# Patient Record
Sex: Female | Born: 1935 | Race: White | Hispanic: No | Marital: Married | Smoking: Former smoker
Health system: Southern US, Community
[De-identification: ages and names within clinical notes are randomized; demographics above are authoritative.]

## PROBLEM LIST (undated history)

## (undated) DIAGNOSIS — I499 Cardiac arrhythmia, unspecified: Secondary | ICD-10-CM

## (undated) DIAGNOSIS — I340 Nonrheumatic mitral (valve) insufficiency: Secondary | ICD-10-CM

## (undated) DIAGNOSIS — E039 Hypothyroidism, unspecified: Secondary | ICD-10-CM

## (undated) DIAGNOSIS — R002 Palpitations: Secondary | ICD-10-CM

## (undated) DIAGNOSIS — D589 Hereditary hemolytic anemia, unspecified: Secondary | ICD-10-CM

## (undated) DIAGNOSIS — R9430 Abnormal result of cardiovascular function study, unspecified: Secondary | ICD-10-CM

## (undated) DIAGNOSIS — I5189 Other ill-defined heart diseases: Secondary | ICD-10-CM

## (undated) DIAGNOSIS — I517 Cardiomegaly: Secondary | ICD-10-CM

## (undated) DIAGNOSIS — G8929 Other chronic pain: Secondary | ICD-10-CM

## (undated) DIAGNOSIS — I2510 Atherosclerotic heart disease of native coronary artery without angina pectoris: Secondary | ICD-10-CM

## (undated) DIAGNOSIS — I219 Acute myocardial infarction, unspecified: Secondary | ICD-10-CM

## (undated) DIAGNOSIS — R0602 Shortness of breath: Secondary | ICD-10-CM

## (undated) DIAGNOSIS — M1990 Unspecified osteoarthritis, unspecified site: Secondary | ICD-10-CM

## (undated) DIAGNOSIS — M549 Dorsalgia, unspecified: Secondary | ICD-10-CM

## (undated) DIAGNOSIS — I358 Other nonrheumatic aortic valve disorders: Secondary | ICD-10-CM

## (undated) DIAGNOSIS — I10 Essential (primary) hypertension: Secondary | ICD-10-CM

## (undated) DIAGNOSIS — J449 Chronic obstructive pulmonary disease, unspecified: Secondary | ICD-10-CM

## (undated) DIAGNOSIS — J189 Pneumonia, unspecified organism: Secondary | ICD-10-CM

## (undated) DIAGNOSIS — K219 Gastro-esophageal reflux disease without esophagitis: Secondary | ICD-10-CM

## (undated) DIAGNOSIS — I739 Peripheral vascular disease, unspecified: Secondary | ICD-10-CM

## (undated) DIAGNOSIS — I779 Disorder of arteries and arterioles, unspecified: Secondary | ICD-10-CM

## (undated) DIAGNOSIS — J80 Acute respiratory distress syndrome: Secondary | ICD-10-CM

## (undated) DIAGNOSIS — N390 Urinary tract infection, site not specified: Secondary | ICD-10-CM

## (undated) DIAGNOSIS — IMO0002 Reserved for concepts with insufficient information to code with codable children: Secondary | ICD-10-CM

## (undated) DIAGNOSIS — Z01818 Encounter for other preprocedural examination: Secondary | ICD-10-CM

## (undated) HISTORY — DX: Pneumonia, unspecified organism: J18.9

## (undated) HISTORY — PX: SPINE SURGERY: SHX786

## (undated) HISTORY — DX: Nonrheumatic mitral (valve) insufficiency: I34.0

## (undated) HISTORY — DX: Urinary tract infection, site not specified: N39.0

## (undated) HISTORY — DX: Reserved for concepts with insufficient information to code with codable children: IMO0002

## (undated) HISTORY — DX: Other nonrheumatic aortic valve disorders: I35.8

## (undated) HISTORY — DX: Disorder of arteries and arterioles, unspecified: I77.9

## (undated) HISTORY — DX: Encounter for other preprocedural examination: Z01.818

## (undated) HISTORY — PX: APPENDECTOMY: SHX54

## (undated) HISTORY — DX: Chronic obstructive pulmonary disease, unspecified: J44.9

## (undated) HISTORY — DX: Abnormal result of cardiovascular function study, unspecified: R94.30

## (undated) HISTORY — DX: Hypothyroidism, unspecified: E03.9

## (undated) HISTORY — DX: Peripheral vascular disease, unspecified: I73.9

## (undated) HISTORY — PX: BACK SURGERY: SHX140

## (undated) HISTORY — DX: Acute respiratory distress syndrome: J80

## (undated) HISTORY — DX: Dorsalgia, unspecified: M54.9

## (undated) HISTORY — DX: Atherosclerotic heart disease of native coronary artery without angina pectoris: I25.10

## (undated) HISTORY — PX: TONSILLECTOMY: SUR1361

## (undated) HISTORY — DX: Hereditary hemolytic anemia, unspecified: D58.9

## (undated) HISTORY — PX: BREAST LUMPECTOMY: SHX2

## (undated) HISTORY — DX: Shortness of breath: R06.02

## (undated) HISTORY — DX: Palpitations: R00.2

## (undated) HISTORY — DX: Cardiomegaly: I51.7

## (undated) HISTORY — DX: Other ill-defined heart diseases: I51.89

## (undated) HISTORY — DX: Gastro-esophageal reflux disease without esophagitis: K21.9

## (undated) SURGERY — Surgical Case
Anesthesia: *Unknown

---

## 2001-06-15 DIAGNOSIS — J80 Acute respiratory distress syndrome: Secondary | ICD-10-CM

## 2001-06-15 HISTORY — DX: Acute respiratory distress syndrome: J80

## 2001-06-15 HISTORY — PX: CORONARY ANGIOPLASTY WITH STENT PLACEMENT: SHX49

## 2001-06-15 HISTORY — PX: TRACHEOSTOMY: SUR1362

## 2002-08-01 ENCOUNTER — Encounter: Payer: Self-pay | Admitting: Cardiology

## 2007-04-18 ENCOUNTER — Encounter: Payer: Self-pay | Admitting: Cardiology

## 2007-06-08 ENCOUNTER — Encounter: Payer: Self-pay | Admitting: Cardiology

## 2007-09-27 ENCOUNTER — Encounter: Payer: Self-pay | Admitting: Cardiology

## 2007-09-30 ENCOUNTER — Encounter: Payer: Self-pay | Admitting: Cardiology

## 2008-02-01 ENCOUNTER — Encounter: Payer: Self-pay | Admitting: Cardiology

## 2008-02-06 ENCOUNTER — Encounter: Payer: Self-pay | Admitting: Cardiology

## 2008-02-23 ENCOUNTER — Encounter: Payer: Self-pay | Admitting: Cardiology

## 2008-07-10 ENCOUNTER — Ambulatory Visit: Payer: Self-pay | Admitting: Cardiology

## 2008-07-26 ENCOUNTER — Ambulatory Visit: Payer: Self-pay | Admitting: Cardiology

## 2008-07-26 ENCOUNTER — Encounter: Payer: Self-pay | Admitting: Cardiology

## 2008-08-20 ENCOUNTER — Ambulatory Visit: Payer: Self-pay | Admitting: Surgery

## 2008-09-07 ENCOUNTER — Ambulatory Visit: Payer: Self-pay | Admitting: Cardiology

## 2009-02-25 ENCOUNTER — Ambulatory Visit: Payer: Self-pay | Admitting: Surgery

## 2009-02-25 ENCOUNTER — Encounter: Payer: Self-pay | Admitting: Cardiology

## 2009-04-04 ENCOUNTER — Encounter: Payer: Self-pay | Admitting: Cardiology

## 2009-07-01 ENCOUNTER — Encounter: Payer: Self-pay | Admitting: Cardiology

## 2009-07-02 ENCOUNTER — Encounter: Payer: Self-pay | Admitting: Cardiology

## 2009-07-03 ENCOUNTER — Encounter: Payer: Self-pay | Admitting: Cardiology

## 2009-07-11 ENCOUNTER — Encounter: Payer: Self-pay | Admitting: Cardiology

## 2009-08-07 ENCOUNTER — Encounter: Payer: Self-pay | Admitting: Cardiology

## 2009-08-08 ENCOUNTER — Encounter: Payer: Self-pay | Admitting: Cardiology

## 2009-08-13 HISTORY — PX: THYROID SURGERY: SHX805

## 2009-08-22 ENCOUNTER — Telehealth (INDEPENDENT_AMBULATORY_CARE_PROVIDER_SITE_OTHER): Payer: Self-pay | Admitting: *Deleted

## 2009-09-05 ENCOUNTER — Encounter: Payer: Self-pay | Admitting: Cardiology

## 2009-09-10 ENCOUNTER — Encounter: Payer: Self-pay | Admitting: Cardiology

## 2009-09-10 DIAGNOSIS — E782 Mixed hyperlipidemia: Secondary | ICD-10-CM | POA: Insufficient documentation

## 2009-09-10 DIAGNOSIS — E785 Hyperlipidemia, unspecified: Secondary | ICD-10-CM | POA: Insufficient documentation

## 2009-09-10 DIAGNOSIS — K219 Gastro-esophageal reflux disease without esophagitis: Secondary | ICD-10-CM | POA: Insufficient documentation

## 2009-09-11 ENCOUNTER — Encounter: Payer: Self-pay | Admitting: Cardiology

## 2009-09-11 ENCOUNTER — Ambulatory Visit: Payer: Self-pay | Admitting: Cardiology

## 2009-09-17 ENCOUNTER — Ambulatory Visit: Payer: Self-pay | Admitting: Cardiology

## 2009-09-17 ENCOUNTER — Encounter: Payer: Self-pay | Admitting: Cardiology

## 2009-12-17 ENCOUNTER — Encounter: Payer: Self-pay | Admitting: Cardiology

## 2010-01-30 ENCOUNTER — Encounter: Payer: Self-pay | Admitting: Cardiology

## 2010-03-17 ENCOUNTER — Ambulatory Visit: Payer: Self-pay | Admitting: Surgery

## 2010-03-27 ENCOUNTER — Encounter: Payer: Self-pay | Admitting: Cardiology

## 2010-07-15 NOTE — Assessment & Plan Note (Signed)
Summary: [facility]   Visit Type:  Follow-up Referring Provider:  Earma Reading, MD Primary Provider:  Guido Sander  CC:  CAD.  History of Present Illness: The patient is seen for followup of coronary artery disease. She is also followed for valvular heart disease.  She has had a difficult year.  She was found to be anemic.  She had GI studies showing no major GI bleeding.  She has been seen by hematology and felt to have hemolytic anemia.  I do not have the specifics of this evaluation.  It is being followed by Dr.Darovsky.  The patient was eventually able to undergo her thyroid surgery.  Fortunately she had benign adenomas. She's had some shortness of breath at times when her hemoglobin is low.  She has not had chest pain.   Preventive Screening-Counseling & Management  Alcohol-Tobacco     Smoking Status: quit     Year Quit: 2002  Current Medications (verified): 1)  Plavix 75 Mg Tabs (Clopidogrel Bisulfate) .... Take One Tablet By Mouth Daily 2)  Evista 60 Mg Tabs (Raloxifene Hcl) .... Take 1 Tablet By Mouth Once A Day 3)  Nexium 40 Mg Cpdr (Esomeprazole Magnesium) .... Take 1 Capsule By Mouth Once A Day 4)  Zocor 80 Mg Tabs (Simvastatin) .... Take 1 Tab By Mouth At Bedtime 5)  Temazepam 30 Mg Caps (Temazepam) .... Take 1 Capsule By Mouth At Bedtime 6)  Donnatal  Tabs (Belladonna Alk-Phenobarbital) .... Take 2 Tablets By Mouth At Bedtime 7)  Benazepril Hcl 5 Mg Tabs (Benazepril Hcl) .... Take 1 Tablet By Mouth Once A Day 8)  Flomax 0.4 Mg Caps (Tamsulosin Hcl) .... Take 1 Capsule By Mouth Once A Day 9)  Macrodantin 100 Mg Caps (Nitrofurantoin Macrocrystal) .... Take 1 Tab By Mouth At Bedtime 10)  Synthroid 75 Mcg Tabs (Levothyroxine Sodium) .... Take 1 Tablet By Mouth Once A Day 11)  Vitamin D3 2000 Unit Caps (Cholecalciferol) .... Take 1 Capsule By Mouth Two Times A Day 12)  Calcium 1800mg  .... Take 1 Tablet By Mouth Two Times A Day 13)  Multivitamins  Tabs (Multiple  Vitamin) .... Take 1 Tablet By Mouth Once A Day 14)  Fish Oil 1000 Mg Caps (Omega-3 Fatty Acids) .... Take 1 Capsule By Mouth Once A Day 15)  Claritin 10 Mg Tabs (Loratadine) .... Take 1 Tablet By Mouth Once A Day 16)  Aspirin 81 Mg Tbec (Aspirin) .... Take One Tablet By Mouth Daily 17)  Nitrostat 0.4 Mg Subl (Nitroglycerin) .Marland Kitchen.. 1 Tablet Under Tongue At Onset of Chest Pain; You May Repeat Every 5 Minutes For Up To 3 Doses. 18)  Nasonex 50 Mcg/act Susp (Mometasone Furoate) .... Use As Directed As Needed 19)  Premarin 0.625 Mg/gm Crea (Estrogens, Conjugated) .... Use As Directed As Needed  Allergies (verified): 1)  ! Bactrim 2)  ! * Tetracaine 3)  Tetracycline  Comments:  Nurse/Medical Assistant: The patient's medications and allergies were reviewed with the patient and were updated in the Medication and Allergy Lists. List reviewed.  Past History:  Past Medical History:  VENTRICULAR HYPERTROPHY, LEFT (ICD-429.3) CAD, NATIVE VESSEL (ICD-414.01).Marland KitchenMarland KitchenStent to LAD 2003  [facility]  /   .myoview.Marland KitchenMarland Kitchen2/2010.Marland Kitchen..Marland Kitchenno ischemia...EF  normal EF 60%...echo...2009 Aortic vale sclerosis....echo...2009 Mitral regurgitation  mild...echo...2009 Carotid artery disease....moderate.Marland KitchenMarland KitchenDr.Brabham following GERD Thyroid....surgery  08/2009 (Hx hypothyroidism)...benign adenomas Dyslipidemia ARDS with tracheostomy..peritonitis...2003 UTIs..chronic..Macrodantin Anemia.... hemolytic.... Dr.Darovsky  Review of Systems       Patient denies fever, chills, headache, sweats, rash, change in vision, change in  hearing, chest pain, cough, nausea vomiting, urinary symptoms.  All other systems are reviewed and are negative  Vital Signs:  Patient profile:   75 year old female Height:      62 inches Weight:      122.4 pounds BMI:     22.47 Pulse rate:   84 / minute BP sitting:   122 / 68  (left arm) Cuff size:   regular  Vitals Entered By: Carlye Grippe (September 11, 2009 3:11 PM) CC: CAD   Physical Exam  General:   The patient is stable today. Head:  head is atraumatic. Eyes:  no xanthelasma. Neck:  no jugular venous sedation.  The surgical site from her thyroid surgery is nicely healed. Chest Wall:  no chest wall tenderness. Lungs:  lungs are clear.  Respiratory effort is nonlabored. Heart:  cardiac exam reveals S1 and S2.  There is a murmur of aortic valve disease. Abdomen:  abdomen is soft. Msk:  no musculoskeletal deformities. Extremities:  no peripheral edema. Skin:  no skin rashes. Psych:  patient is oriented to person time and place.  Affect is normal.   Impression & Recommendations:  Problem # 1:  * HEMOLYTIC ANEMIA The patient's hemolytic anemia is being managed by hematology.  I feel that it is not related to her valvular heart disease.  She does not have a prosthetic valve.  However 2-D echo will be done to reassess to be sure that there are no unusual valvular findings.  Problem # 2:  * THYROID Fortunately the patient's surgery has shown that she had thyroid adenomas.  Problem # 3:  CAROTID ARTERY DISEASE (ICD-433.10) The patient is followed by vascular surgery.  Problem # 4:  AORTIC VALVE SCLEROSIS (ICD-424.1)  Her updated medication list for this problem includes:    Benazepril Hcl 5 Mg Tabs (Benazepril hcl) .Marland Kitchen... Take 1 tablet by mouth once a day    Nitrostat 0.4 Mg Subl (Nitroglycerin) .Marland Kitchen... 1 tablet under tongue at onset of chest pain; you may repeat every 5 minutes for up to 3 doses.  Orders: 2-D Echocardiogram (2D Echo) Patient will have a followup 2-D echo to reassess her valvular disease.  We will then be in touch with her with the findings.  Problem # 5:  CAD, NATIVE VESSEL (ICD-414.01)  Her updated medication list for this problem includes:    Plavix 75 Mg Tabs (Clopidogrel bisulfate) .Marland Kitchen... Take one tablet by mouth daily    Benazepril Hcl 5 Mg Tabs (Benazepril hcl) .Marland Kitchen... Take 1 tablet by mouth once a day    Aspirin 81 Mg Tbec (Aspirin) .Marland Kitchen... Take one tablet by  mouth daily    Nitrostat 0.4 Mg Subl (Nitroglycerin) .Marland Kitchen... 1 tablet under tongue at onset of chest pain; you may repeat every 5 minutes for up to 3 doses.  Orders: EKG w/ Interpretation (93000) 2-D Echocardiogram (2D Echo) Coronary artery disease is stable.  EKG is done today and reviewed by me.  There are no significant changes.  Patient remains on aspirin and Plavix.  She prefers to stay on Plavix long term.  If it is felt by hematology that it would be better to stop Plavix, this can be done from a cardiac viewpoint.  Patient Instructions: 1)  Your physician recommends that you continue on your current medications as directed. Please refer to the Current Medication list given to you today. 2)  Your physician wants you to follow-up in:23months. You will receive a reminder letter in the mail about two  months in advance. If you don't receive a letter, please call our office to schedule the follow-up appointment. 3)  Your physician has requested that you have an echocardiogram.  Echocardiography is a painless test that uses sound waves to create images of your heart. It provides your doctor with information about the size and shape of your heart and how well your heart's chambers and valves are working.  This procedure takes approximately one hour. There are no restrictions for this procedure.

## 2010-07-15 NOTE — Progress Notes (Signed)
Summary: SOB  Phone Note Call from Patient Call back at Home Phone [phone]   Caller: Patient Call For: nurse Summary of Call: patient left message on machine that she had thyroid surgery on Monday and today is having SOB. Nurse called patient and informed her that if she was having SOB that she needed to go to ED for evaluation. Patient stated it wasn't that bad for ED. Nurse suggested to patient that if she was having SOB that just started that she did need to be evaluated by a physician. Patient sees PA at [facility]. Patient is scheduled to see MD here on 30th. Patient verbalized understanding of plan.       Initial call taken by: Carlye Grippe,  August 22, 2009 2:17 PM Call placed by: Carlye Grippe,  August 22, 2009 2:11 PM  Follow-up for Phone Call        Agree  Talitha Givens, MD, [facility]  August 22, 2009 3:14 PM

## 2010-07-15 NOTE — Letter (Signed)
Summary: Appointment- Rescheduled  [facility] [address]   [place], [place] [address]   Phone: [phone]  Fax: [phone]     August 07, 2009 MRN: [id number]     Mckenzie [name] [address] [place], [place]  [address]     Dear Ms. DLUGOSZ,   Due to a change in our office schedule, your appointment on September 04, 2009                    at  3:15 pm must be changed.    Your new appointment is scheduled for March 30 at 2:15 pm.  We look forward to participating in your health care needs.   Please contact us at the number listed above at your earliest convenience to reschedule this appointment if needed.      Sincerely,  Glass blower/designer

## 2010-07-15 NOTE — Letter (Signed)
Summary: External Correspondence/ [facility] NOTE  External Correspondence/ [facility] NOTE   Imported By: Dorise Hiss 12/19/2009 09:54:14  _____________________________________________________________________  External Attachment:    Type:   Image     Comment:   External Document

## 2010-07-15 NOTE — Letter (Signed)
Summary: External Correspondence/ [facility]  External Correspondence/ [facility]   Imported By: Dorise Hiss 03/31/2010 12:12:55  _____________________________________________________________________  External Attachment:    Type:   Image     Comment:   External Document

## 2010-07-15 NOTE — Miscellaneous (Signed)
  Clinical Lists Changes  Problems: Removed problem of BRUIT (ICD-785.9) Added new problem of CAROTID ARTERY DISEASE (ICD-433.10) Added new problem of DYSLIPIDEMIA (ICD-272.4) Added new problem of * THYROID Added new problem of GERD (ICD-530.81) Added new problem of MITRAL REGURGITATION (ICD-396.3) Observations: Added new observation of PAST MED HX:  VENTRICULAR HYPERTROPHY, LEFT (ICD-429.3) CAD, NATIVE VESSEL (ICD-414.01).Marland KitchenMarland KitchenStent to LAD 2003  [facility]  /   .myoview.Marland KitchenMarland Kitchen2/2010.Marland Kitchen..Marland Kitchenno ischemia...EF  normal EF 60%...echo...2009 Aortic vale sclerosis....echo...2009 Mitral regurgitation  mild...echo...2009 Carotid artery disease....moderate.Marland KitchenMarland KitchenDr.Brabham following GERD Thyroid....surgery  08/2009 (Hx hypothyroidism) Dyslipidemia ARDS with tracheostomy..peritonitis...2003 UTIs..chronic..Macrodantin (09/10/2009 11:24)       Past History:  Past Medical History:  VENTRICULAR HYPERTROPHY, LEFT (ICD-429.3) CAD, NATIVE VESSEL (ICD-414.01).Marland KitchenMarland KitchenStent to LAD 2003  [facility]  /   .myoview.Marland KitchenMarland Kitchen2/2010.Marland Kitchen..Marland Kitchenno ischemia...EF  normal EF 60%...echo...2009 Aortic vale sclerosis....echo...2009 Mitral regurgitation  mild...echo...2009 Carotid artery disease....moderate.Marland KitchenMarland KitchenDr.Brabham following GERD Thyroid....surgery  08/2009 (Hx hypothyroidism) Dyslipidemia ARDS with tracheostomy..peritonitis...2003 UTIs..chronic..Macrodantin

## 2010-07-15 NOTE — Letter (Signed)
Summary: External Correspondence/ FAXED [facility]  External Correspondence/ FAXED [facility]   Imported By: Dorise Hiss 08/08/2009 16:29:58  _____________________________________________________________________  External Attachment:    Type:   Image     Comment:   External Document

## 2010-07-15 NOTE — Letter (Signed)
Summary: External Correspondence/ [facility]  External Correspondence/ [facility]   Imported By: Dorise Hiss 01/31/2010 11:48:14  _____________________________________________________________________  External Attachment:    Type:   Image     Comment:   External Document

## 2010-07-15 NOTE — Letter (Signed)
Summary: External Correspondence/ [facility]  External Correspondence/ [facility]   Imported By: Dorise Hiss 09/11/2009 12:03:04  _____________________________________________________________________  External Attachment:    Type:   Image     Comment:   External Document

## 2010-08-29 ENCOUNTER — Encounter: Payer: Self-pay | Admitting: *Deleted

## 2010-09-08 ENCOUNTER — Encounter: Payer: Self-pay | Admitting: Cardiology

## 2010-09-08 ENCOUNTER — Ambulatory Visit (INDEPENDENT_AMBULATORY_CARE_PROVIDER_SITE_OTHER): Payer: Medicare Other | Admitting: Cardiology

## 2010-09-08 ENCOUNTER — Other Ambulatory Visit: Payer: Self-pay | Admitting: *Deleted

## 2010-09-08 ENCOUNTER — Encounter: Payer: Self-pay | Admitting: *Deleted

## 2010-09-08 DIAGNOSIS — R0602 Shortness of breath: Secondary | ICD-10-CM

## 2010-09-08 DIAGNOSIS — I5189 Other ill-defined heart diseases: Secondary | ICD-10-CM | POA: Insufficient documentation

## 2010-09-08 DIAGNOSIS — J80 Acute respiratory distress syndrome: Secondary | ICD-10-CM | POA: Insufficient documentation

## 2010-09-08 DIAGNOSIS — I779 Disorder of arteries and arterioles, unspecified: Secondary | ICD-10-CM | POA: Insufficient documentation

## 2010-09-08 DIAGNOSIS — M549 Dorsalgia, unspecified: Secondary | ICD-10-CM | POA: Insufficient documentation

## 2010-09-08 DIAGNOSIS — I739 Peripheral vascular disease, unspecified: Secondary | ICD-10-CM

## 2010-09-08 DIAGNOSIS — K219 Gastro-esophageal reflux disease without esophagitis: Secondary | ICD-10-CM | POA: Insufficient documentation

## 2010-09-08 DIAGNOSIS — I517 Cardiomegaly: Secondary | ICD-10-CM | POA: Insufficient documentation

## 2010-09-08 DIAGNOSIS — D599 Acquired hemolytic anemia, unspecified: Secondary | ICD-10-CM

## 2010-09-08 DIAGNOSIS — E039 Hypothyroidism, unspecified: Secondary | ICD-10-CM

## 2010-09-08 DIAGNOSIS — R002 Palpitations: Secondary | ICD-10-CM

## 2010-09-08 DIAGNOSIS — D589 Hereditary hemolytic anemia, unspecified: Secondary | ICD-10-CM | POA: Insufficient documentation

## 2010-09-08 DIAGNOSIS — I358 Other nonrheumatic aortic valve disorders: Secondary | ICD-10-CM | POA: Insufficient documentation

## 2010-09-08 NOTE — Assessment & Plan Note (Signed)
>>  ASSESSMENT AND PLAN FOR HYPOTHYROIDISM WRITTEN ON 09/08/2010  3:33 PM BY KATZ, JEFFREY D, MD  We need to be sure that her thyroid status is okay.  TSH will be checked.

## 2010-09-08 NOTE — Patient Instructions (Signed)
Your physician recommends that you go to the [facility] for lab work: BMET/TSH A chest x-ray takes a picture of the organs and structures inside the chest, including the heart, lungs, and blood vessels. This test can show several things, including, whether the heart is enlarges; whether fluid is building up in the lungs; and whether pacemaker / defibrillator leads are still in place. Your physician has requested that you have an echocardiogram. Echocardiography is a painless test that uses sound waves to create images of your heart. It provides your doctor with information about the size and shape of your heart and how well your heart's chambers and valves are working. This procedure takes approximately one hour. There are no restrictions for this procedure. Your physician has requested that you have a lexiscan cardiolite. For further information please visit [email or web address] Please follow instruction sheet, as given. Your physician has recommended that you wear a holter monitor. Holter monitors are medical devices that record the heart's electrical activity. Doctors most often use these monitors to diagnose arrhythmias. Arrhythmias are problems with the speed or rhythm of the heartbeat. The monitor is a small, portable device. You can wear one while you do your normal daily activities. This is usually used to diagnose what is causing palpitations/syncope (passing out).

## 2010-09-08 NOTE — Assessment & Plan Note (Signed)
The patient reports that her hemoglobin has been in the 10-11 range with treatment.  It seems unlikely that this is the basis of her shortness of breath.

## 2010-09-08 NOTE — Assessment & Plan Note (Signed)
Etiology of her palpitations and racing heartbeat is not clear.  EKG today shows sinus rhythm.  She will wear a 48-hour recorder to get a better understanding of her heart rhythm.

## 2010-09-08 NOTE — Assessment & Plan Note (Signed)
I am hopeful that we can get her cleared for back surgery.  However with her symptoms a complete cardiac workup is definitely needed first

## 2010-09-08 NOTE — Assessment & Plan Note (Signed)
We need to be sure that her thyroid status is okay.  TSH will be checked.

## 2010-09-08 NOTE — Progress Notes (Signed)
HPI The patient is seen for the evaluation of shortness of breath.  I saw her last March, 2011.  She has mild coronary disease or he she received a stent to the LAD in 2003.  Myoview in February 2 010 revealed no ischemia.  Her ejection fraction most recently was 60% in 2009 and 2010.  There is aortic valve sclerosis by history.  There is moderate carotid artery disease.  She has a history of thyroid surgery.  She also has hemolytic anemia that is being treated with very low-dose prednisone.  She is not having PND or orthopnea.  She feels the shortness of breath with exertion.  She is having palpitations that she notices mostly at rest.  He feels it and senses a racing sensation.  She has not had any syncope or presyncope.  The shortness of breath seems to have come on over the past recent months. Allergies  Allergen Reactions  . Sulfamethoxazole W/Trimethoprim   . Tetracycline     REACTION: nausea    Current Outpatient Prescriptions  Medication Sig Dispense Refill  . alendronate (FOSAMAX) 70 MG tablet Take 70 mg by mouth every 7 (seven) days. Take with a full glass of water on an empty stomach.       Marland Kitchen aspirin 81 MG tablet Take 1 tablet by mouth daily.      Marland Kitchen atropine-PHENObarbital-scopolamine-hyoscyamine (DONNATAL) 16.2 MG per tablet Take 2 tablets by mouth at bedtime.        . benazepril (LOTENSIN) 5 MG tablet Take 1 tablet by mouth daily.      . Calcium Carbonate-Vit D-Min (CALCIUM 1200 PO) Take one tab daily       . Cholecalciferol (VITAMIN D3) 1000 UNITS CAPS Take one tab daily       . clopidogrel (PLAVIX) 75 MG tablet Take 1 tablet by mouth daily.       . Cobalamine Combinations (B-6 FOLIC ACID PO) Take by mouth. Take one tab daily       . conjugated estrogens (PREMARIN) vaginal cream Use as directed.       . docusate sodium (COLACE) 100 MG capsule 100 mg. Take 2 tabs twice a day       . esomeprazole (NEXIUM) 40 MG capsule Take 1 capsule by mouth daily.       . Ferrous Sulfate (PX IRON)  27 MG TABS Take by mouth. Take one tab daily       . hyoscyamine (LEVSIN SL) 0.125 MG SL tablet Use one tab sublingual every 6 hours as needed       . levothyroxine (SYNTHROID, LEVOTHROID) 75 MCG tablet Take 1 tablet by mouth daily.       Marland Kitchen loratadine (CLARITIN) 10 MG tablet Take 1 tablet by mouth daily.      . Melatonin 3-10 MG TABS Take by mouth. Take one tab at bedtime       . Multiple Vitamin (MULTIVITAMIN) tablet Take 1 tablet by mouth daily.        . nitrofurantoin (MACRODANTIN) 100 MG capsule Take 1 tablet by mouth every night.       . phenazopyridine (PYRIDIUM) 200 MG tablet Take one tab twice a day       . predniSONE (DELTASONE) 5 MG tablet Take 2.5mg  (1/2 tab) every other day       . raloxifene (EVISTA) 60 MG tablet Take 1 tablet by mouth daily.      . simvastatin (ZOCOR) 80 MG tablet Take 1 tablet by mouth every  night.       . Tamsulosin HCl (FLOMAX) 0.4 MG CAPS Take 1 capsule by mouth daily.       . temazepam (RESTORIL) 30 MG capsule Take 1 tablet by mouth every night.       . tretinoin (RETIN-A) 0.05 % cream Apply topically at bedtime.        Marland Kitchen DISCONTD: Omega-3 Fatty Acids (FISH OIL) 1000 MG CAPS Take 1 capsule by mouth daily.       . mometasone (NASONEX) 50 MCG/ACT nasal spray 2 sprays by Nasal route as needed.        . nitroGLYCERIN (NITROSTAT) 0.4 MG SL tablet Place one tablet under tongue every 5 minutes up to 3 doses as needed for chest pain. No more than 3 doses over a 15 minute period.       Marland Kitchen DISCONTD: Calcium Carbonate (CALCIUM 600 PO) Take 2 tablets by mouth 2 (two) times daily.        Marland Kitchen DISCONTD: Cholecalciferol (VITAMIN D3) 2000 UNITS TABS Take 1 tablet by mouth 2 (two) times daily.          History   Social History  . Marital Status: Married    Spouse Name: N/A    Number of Children: N/A  . Years of Education: N/A   Occupational History  . Retired    Social History Main Topics  . Smoking status: Former Smoker    Quit date: 06/15/2000  . Smokeless tobacco:  Not on file  . Alcohol Use: No  . Drug Use: No  . Sexually Active: Not on file   Other Topics Concern  . Not on file   Social History Narrative   Regular Exercise: Yes-Walk    Family History  Problem Relation Age of Onset  . COPD Other     Past Medical History  Diagnosis Date  . Left ventricular hypertrophy     EF 65%, echo, April, 2011  . Coronary atherosclerosis of native coronary artery     Stent to LAD 2003  [facility]  /   .myoview.Marland KitchenMarland Kitchen2/2010.Marland Kitchen..Marland Kitchenno ischemia...EF  normal; EF 60% by Echo 2009  . Aortic valve sclerosis     Echo 2009,  Echo, 2011  . MR (mitral regurgitation)     Mild per Echo 2009,  mild, echo, April, 2011  . Carotid arterial disease     Moderate-Dr. Myra Gianotti following  . GERD (gastroesophageal reflux disease)   . Hypothyroidism   . ARDS (adult respiratory distress syndrome) 2003    with tracheostomy-peritonitis  . UTI (urinary tract infection)     Chronic-on Macrodantin  . Hemolytic anemia     Followed by Dr. Derald Macleod  . Diastolic dysfunction     Echo, April, 20 and one, EF 65%  . Shortness of breath     March, 2012  . Palpitations     March, 2012  . Back pain     Need for surgery, Dr. Trey Sailors    Past Surgical History  Procedure Date  . Appendectomy   . Breast lumpectomy   . Tonsillectomy   . Tracheostomy 2003    Following ARDS   . Thyroid surgery 08/2009    Benign adenomas    ROS  Patient denies fever, chills, headache, sweats, rash, change in vision, change in hearing, chest pain, cough, nausea vomiting, urinary symptoms.  All other systems are reviewed and are negative. PHYSICAL EXAM Patient is stable today.  She is concerned about her shortness of breath.  She is oriented  to person time and place.  Affect is normal.  Head is atraumatic.  There is no xanthelasma.  There is no jugular venous distention.  Lungs are clear.  Respiratory effort is nonlabored.  Cardiac exam reveals a swollen and S2.  There no clicks.  There is a soft systolic  murmur.  The abdomen is soft.  There is no peripheral edema.  There are no musculoskeletal deformities.  There no skin rashes. Filed Vitals:   09/08/10 1506  BP: 119/63  Pulse: 80  Height: 5\' 4"  (1.626 m)  Weight: 124 lb 6.4 oz (56.427 kg)    EKG  EKG is done today and reviewed by me.  It is normal.  ASSESSMENT & PLAN

## 2010-09-08 NOTE — Assessment & Plan Note (Addendum)
The etiology of her shortness of breath is not clear at this time.  She does not appear to have overt signs of congestive heart failure.  It is possible that this could be an anginal equivalent.  She is not having chest pain.  It is possible also that she could have had change in left ventricular function.  Two-dimensional echo will be done to reassess her aortic valve disease and mitral valve disease and assess her left ventricular function.  She will also have a nuclear exercise scan to rule out ischemia.  We need this for diagnosis and we also need this to help clear her for the surgery that she needs. She will also have a chest x-ray.

## 2010-09-10 ENCOUNTER — Encounter: Payer: Self-pay | Admitting: Cardiology

## 2010-09-15 DIAGNOSIS — R0602 Shortness of breath: Secondary | ICD-10-CM

## 2010-09-22 ENCOUNTER — Telehealth: Payer: Self-pay | Admitting: *Deleted

## 2010-09-22 NOTE — Telephone Encounter (Signed)
Pt would like to know the results of her tests (stress test and Echo) that were done last week. These are scanned in but I cannot tell if they have been reviewed by Dr. Myrtis Ser.

## 2010-09-23 NOTE — Telephone Encounter (Signed)
Pt notified of results and verbalized understanding  

## 2010-09-23 NOTE — Telephone Encounter (Signed)
Test results all look good including chest x-ray, nuclear, echo.

## 2010-09-25 ENCOUNTER — Encounter: Payer: Self-pay | Admitting: Cardiology

## 2010-09-25 ENCOUNTER — Telehealth: Payer: Self-pay | Admitting: *Deleted

## 2010-09-25 NOTE — Telephone Encounter (Signed)
Left message to call back on voicemail regarding holter results. 

## 2010-09-30 NOTE — Telephone Encounter (Signed)
Pt notified of results by Isabelle Course on 4/13.

## 2010-10-14 ENCOUNTER — Encounter: Payer: Self-pay | Admitting: Cardiology

## 2010-10-14 DIAGNOSIS — R002 Palpitations: Secondary | ICD-10-CM | POA: Insufficient documentation

## 2010-10-14 DIAGNOSIS — R9430 Abnormal result of cardiovascular function study, unspecified: Secondary | ICD-10-CM | POA: Insufficient documentation

## 2010-10-14 DIAGNOSIS — I340 Nonrheumatic mitral (valve) insufficiency: Secondary | ICD-10-CM | POA: Insufficient documentation

## 2010-10-16 ENCOUNTER — Ambulatory Visit (INDEPENDENT_AMBULATORY_CARE_PROVIDER_SITE_OTHER): Payer: Medicare Other | Admitting: Cardiology

## 2010-10-16 ENCOUNTER — Encounter: Payer: Self-pay | Admitting: Cardiology

## 2010-10-16 ENCOUNTER — Encounter: Payer: Self-pay | Admitting: *Deleted

## 2010-10-16 DIAGNOSIS — R002 Palpitations: Secondary | ICD-10-CM

## 2010-10-16 DIAGNOSIS — R0602 Shortness of breath: Secondary | ICD-10-CM

## 2010-10-16 DIAGNOSIS — M549 Dorsalgia, unspecified: Secondary | ICD-10-CM

## 2010-10-16 LAB — PROTIME-INR

## 2010-10-16 MED ORDER — DILTIAZEM HCL ER BEADS 120 MG PO CP24: 120.0000 mg | ORAL_CAPSULE | Freq: Every day | ORAL | Status: DC

## 2010-10-16 NOTE — Patient Instructions (Signed)
Follow up as scheduled. Your physician recommends that you go to the [facility] for lab work: DO TODAY! Your physician has requested that you have a cardiac catheterization. Cardiac catheterization is used to diagnose and/or treat various heart conditions. Doctors may recommend this procedure for a number of different reasons. The most common reason is to evaluate chest pain. Chest pain can be a symptom of coronary artery disease (CAD), and cardiac catheterization can show whether plaque is narrowing or blocking your heart's arteries. This procedure is also used to evaluate the valves, as well as measure the blood flow and oxygen levels in different parts of your heart. For further information please visit [email or web address] Please follow instruction sheet, as given. Start Diltiazem 120mg  daily.

## 2010-10-16 NOTE — Assessment & Plan Note (Signed)
Today's information will be sent to Dr. Channing Mutters.  I will let him know when she can be cleared for back surgery.

## 2010-10-16 NOTE — Progress Notes (Signed)
HPI Patient is seen for cardiology followup.  She is here with her husband today.  I had an extensive discussion with them.  I saw her last on September 08, 2010.  She had received a stent to the LAD in 2003 at [facility] in 2010 showed no ischemia.  Ejection fraction is normal.  She has exertional shortness of breath and palpitations.  We arranged for blood studies, echo, nuclear study and a Holter monitor.  The blood study showed no significant abnormalities.  The echo revealed normal LV function with aortic valve sclerosis.  Event recorder revealed PACs and PVCs.  There was one 4 beat run of supraventricular tachycardia.  A nuclear scan as limitations due to artifact.  There were some reversible defects seen at the base of the inferolateral wall and the inferior wall.  Also there was a possibility of some ischemia at the anteroseptal apex and inferior base.  The EF was normal.  Today in the office I explained all of these results.  The patient needs to have back surgery.  She has exertional shortness of breath.  I cannot be sure if this is her anginal equivalent or not.  Chest x-ray was also done recently showing no marked abnormalities. Allergies  Allergen Reactions  . Sulfamethoxazole W/Trimethoprim   . Tetracycline     REACTION: nausea    Current Outpatient Prescriptions  Medication Sig Dispense Refill  . alendronate (FOSAMAX) 70 MG tablet Take one tablet on every Saturday.      Marland Kitchen aspirin 81 MG tablet Take 1 tablet by mouth daily.      Marland Kitchen atropine-PHENObarbital-scopolamine-hyoscyamine (DONNATAL) 16.2 MG per tablet Take 2 tablets by mouth at bedtime.        . benazepril (LOTENSIN) 5 MG tablet Take 1 tablet by mouth daily.      . Calcium Carbonate-Vit D-Min (CALCIUM 1200 PO) Take one tab daily       . Cholecalciferol (VITAMIN D3) 1000 UNITS CAPS Take one tab daily       . clopidogrel (PLAVIX) 75 MG tablet Take 1 tablet by mouth daily.       . Cobalamine Combinations (B-6 FOLIC ACID PO) Take  by mouth. Take one tab daily       . conjugated estrogens (PREMARIN) vaginal cream Use as directed.       . docusate sodium (COLACE) 100 MG capsule 100 mg. Take 2 tabs twice a day       . esomeprazole (NEXIUM) 40 MG capsule Take 1 capsule by mouth daily.       . Ferrous Sulfate (PX IRON) 27 MG TABS Take by mouth. Take one tab daily       . hyoscyamine (LEVSIN SL) 0.125 MG SL tablet Use one tab sublingual every 6 hours as needed       . levothyroxine (SYNTHROID, LEVOTHROID) 75 MCG tablet Take 1 tablet by mouth daily.       Marland Kitchen loratadine (CLARITIN) 10 MG tablet Take 1 tablet by mouth daily.      . Melatonin 3-10 MG TABS Take by mouth. Take one tab at bedtime       . mometasone (NASONEX) 50 MCG/ACT nasal spray 2 sprays by Nasal route as needed.        . Multiple Vitamin (MULTIVITAMIN) tablet Take 1 tablet by mouth daily.        . nitrofurantoin (MACRODANTIN) 100 MG capsule Take 1 tablet by mouth every night.       . phenazopyridine (  PYRIDIUM) 200 MG tablet Take one tab twice a day       . predniSONE (DELTASONE) 5 MG tablet Take 2.5mg  (1/2 tab) every other day       . raloxifene (EVISTA) 60 MG tablet Take 1 tablet by mouth daily.      . simvastatin (ZOCOR) 80 MG tablet Take 1 tablet by mouth every night.       . Tamsulosin HCl (FLOMAX) 0.4 MG CAPS Take 1 capsule by mouth daily.       . temazepam (RESTORIL) 30 MG capsule Take 1 tablet by mouth every night.       . tretinoin (RETIN-A) 0.05 % cream Apply topically at bedtime.        . nitroGLYCERIN (NITROSTAT) 0.4 MG SL tablet Place one tablet under tongue every 5 minutes up to 3 doses as needed for chest pain. No more than 3 doses over a 15 minute period.         History   Social History  . Marital Status: Married    Spouse Name: N/A    Number of Children: N/A  . Years of Education: N/A   Occupational History  . Retired    Social History Main Topics  . Smoking status: Former Smoker    Quit date: 06/15/2000  . Smokeless tobacco: Not on  file  . Alcohol Use: No  . Drug Use: No  . Sexually Active: Not on file   Other Topics Concern  . Not on file   Social History Narrative   Regular Exercise: Yes-Walk    Family History  Problem Relation Age of Onset  . COPD Other     Past Medical History  Diagnosis Date  . Left ventricular hypertrophy     EF 65%, echo, April, 2011  . Coronary atherosclerosis of native coronary artery     Nuclear September 15, 2010, study affected by artifact, mild abnormalities but no definite marked ischemia    Stent to LAD 2003  [facility]  /   .[email or web address] KitchenMarland Kitchen2/2010.Marland Kitchen..Marland Kitchenno ischemia...EF  normal; EF 60% by Echo 2009  . Aortic valve sclerosis     Echo 2009,  Echo, 2011  . MR (mitral regurgitation)      Mild echo, 2012 ///  Mild per Echo 2009,  mild, echo, April, 2011  . Carotid arterial disease     Moderate-Dr. Myra Gianotti following  . GERD (gastroesophageal reflux disease)   . Hypothyroidism   . ARDS (adult respiratory distress syndrome) 2003    with tracheostomy-peritonitis  . UTI (urinary tract infection)     Chronic-on Macrodantin  . Hemolytic anemia     Followed by Dr. Derald Macleod  . Diastolic dysfunction     Echo, April, 2011   EF 65%  . Shortness of breath     March, 2012  . Palpitations     Holter, September 16, 2010, PACs and PVCs,one 4 beat run of SVT  . Back pain     Need for surgery, Dr. Trey Sailors  . Ejection fraction     EF 65%, echo, September 15, 2010    Past Surgical History  Procedure Date  . Appendectomy   . Breast lumpectomy   . Tonsillectomy   . Tracheostomy 2003    Following ARDS   . Thyroid surgery 08/2009    Benign adenomas    ROS  Patient denies fever, chills, headache, sweats, rash, change in vision, change in hearing, chest pain, cough, nausea vomiting, urinary symptoms.  All other systems are reviewed  and are negative.  PHYSICAL EXAM Patient is here with her husband.  She is oriented to person time and place.  Affect is normal.  Head is atraumatic.  There is no xanthelasma.   There no carotid bruits.  There is no jugular venous distention.  Lungs are clear.  Respiratory effort is not labored.  Cardiac exam reveals S1-S2.  No clicks or significant murmurs.  The abdomen is soft.  There is no peripheral edema.  There are no musculoskeletal deformities.  There are no skin rashes. Filed Vitals:   10/16/10 1308  BP: 123/65  Pulse: 73  Height: 5\' 2"  (1.575 m)  Weight: 127 lb 6.4 oz (57.788 kg)  SpO2: 93%      EKG is not done today.  ASSESSMENT & PLAN

## 2010-10-16 NOTE — Assessment & Plan Note (Signed)
As outlined in history of present illness, the patient does have some nuclear abnormalities.  It is not clear if her shortness of breath is angina.  After long discussion with the patient and her husband, we all agree that we should proceed with catheterization.  This will help with the assessment of her shortness of breath.  He will also help with clearing her for back surgery that she needs.  This is being scheduled.

## 2010-10-16 NOTE — Assessment & Plan Note (Signed)
As outlined in the history of present illness the patient has PACs and PVCs.  In addition she had a 4 beat run of supraventricular tachycardia.  Today I will start long-acting diltiazem at 120 mg daily.  I will see if this helps with her symptoms.

## 2010-10-21 ENCOUNTER — Inpatient Hospital Stay (HOSPITAL_BASED_OUTPATIENT_CLINIC_OR_DEPARTMENT_OTHER)
Admission: RE | Admit: 2010-10-21 | Discharge: 2010-10-21 | Disposition: A | Discharge status: Home / Self Care | Payer: Medicare Other | Source: Ambulatory Visit | Attending: Cardiovascular Disease | Admitting: Cardiovascular Disease

## 2010-10-21 DIAGNOSIS — Z9861 Coronary angioplasty status: Secondary | ICD-10-CM | POA: Insufficient documentation

## 2010-10-21 DIAGNOSIS — IMO0002 Reserved for concepts with insufficient information to code with codable children: Secondary | ICD-10-CM | POA: Insufficient documentation

## 2010-10-21 DIAGNOSIS — I2510 Atherosclerotic heart disease of native coronary artery without angina pectoris: Secondary | ICD-10-CM | POA: Insufficient documentation

## 2010-10-21 DIAGNOSIS — R9439 Abnormal result of other cardiovascular function study: Secondary | ICD-10-CM | POA: Insufficient documentation

## 2010-10-21 DIAGNOSIS — Y840 Cardiac catheterization as the cause of abnormal reaction of the patient, or of later complication, without mention of misadventure at the time of the procedure: Secondary | ICD-10-CM | POA: Insufficient documentation

## 2010-10-28 NOTE — Assessment & Plan Note (Signed)
OFFICE VISIT   YAEL, COPPESS  DOB:  [birth year]                                       08/20/2008  ZOXWR#:[id number]   REASON FOR VISIT:  Left carotid stenosis.   HISTORY:  This is a 75 year old female with significant coronary disease  status post coronary stenting for previous MI occurring in 2002.  She  also has a history of hypertension, hypercholesterolemia, which are  medically managed, and a history of smoking, having quit in 2002.  Dr.  Myrtis Ser heard a left carotid bruit and sent her for carotid duplex.  Duplex  reveals a 50% to 69% stenosis in bilateral carotid arteries.  The  patient denies having any symptoms.  Specifically, she denies amaurosis  fugax, numbness or weakness in either upper or lower extremity, no  slurring of her speech.  She is very active.   REVIEW OF SYSTEMS:  GENERAL:  Negative for fevers, chills, weight loss  or weight gain.  CARDIAC:  Positive for palpitations.  PULMONARY:  Negative.  GI:  Positive for reflux and hiatal hernia.  GU:  Has frequent urination.  VASCULAR:  Negative.  NEURO:  Negative.  HEME:  Positive for nosebleeds.   PAST MEDICAL HISTORY:  1. Hypertension.  2. Previous MI.  3. Hypercholesterolemia.  4. Coronary artery disease.  5. Hypothyroidism.   PAST SURGICAL HISTORY:  1. Tracheostomy.  2. Tonsillectomy.  3. Breast lumpectomy.  4. Appendectomy.  5. Coronary stent.   FAMILY HISTORY:  Negative for cardiovascular disease at an early age.   SOCIAL HISTORY:  She is married.  She is retired.  Does not smoke.  Has  a history smoking but quit in 2002.  Does not drink alcohol.   MEDICATIONS:  Plavix, Evista, simvastatin, temazepam, Donnatab,  benazepril, Flomax, Macrodantin, Synthroid, Nexium, Premarin cream,  Retin-A cream and Nasonex.   ALLERGIES:  Tetracycline.   PHYSICAL EXAMINATION:  Her blood pressure is 120/63, pulse is 56.  General:  She is well-appearing, no acute distress.  HEENT:   She is  normocephalic, atraumatic.  Pupils equal.  Sclerae are anicteric.  Neck:  Supple, no JVD, positive left carotid bruit.  Cardiovascular:  Regular  rate and rhythm.  No murmurs, rubs or gallops.  Pulmonary:  Lungs are  clear bilaterally.  Abdomen:  Soft.  Extremities:  Warm and well-  perfused.  Neuro:  She is nonfocal.  Psych:  She is alert and oriented  x3.   DIAGNOSTIC STUDIES:  I reviewed her ultrasound which reveals bilateral  moderate carotid stenosis.   ASSESSMENT/PLAN:  Moderate bilateral carotid stenosis.   Plan:  We discussed the signs and symptoms of a stroke and told her to  seek medical attention if these were to occur., I think that she needs  to have her ultrasound repeated in 6 months, we will arrange for that to  be done here, and I will see her following that visit.  She is doing an  excellent with medical management.  She is on a statin, her blood  pressure is well controlled, she is taking an aspirin.  I plan on seeing  her back and 6 months.   Jorge Ny, MD  Electronically Signed   VWB/MEDQ  D:  08/20/2008  T:  08/21/2008  Job:  [id number]   cc:   Luis Abed, MD, [facility]  Almond Lint, Dr.

## 2010-10-28 NOTE — Assessment & Plan Note (Signed)
[facility]                          [facility] OFFICE NOTE   Mckenzie [name], Mckenzie [name]                    MRN:          [id number]  DATE:09/07/2008                            DOB:          1935-09-07    Ms. Mckenzie [name] is doing very well.  I saw her on July 10, 2008.  I  reviewed all of her information at that time and she was stable.  She  does have coronary artery disease.  At that time, I heard a carotid  bruit and we arranged for carotid Dopplers.  Ultimately, she was  referred to Dr. Myra Gianotti of [facility] in [place].  He felt that she had  moderate bilateral disease and he has arranged to see her himself in  followup for Dopplers in 6 months to make sure that she is stable.  She  is on a statin and she is on aspirin.  Blood pressure is well  controlled.  We also arranged for a stress Myoview scan.  This showed no  significant ischemia and showed a good ejection fraction.  No further  cardiac workup is needed at this time.  She is feeling well in general.   PAST MEDICAL HISTORY:   ALLERGIES:  TETRACYCLINE.   MEDICATIONS:  See the prior flow sheet.   REVIEW OF SYSTEMS:  She has no GI or GU symptoms.  She has no fevers or  chills or headaches.  She has no musculoskeletal problems.  There are no  GI or GU symptoms.  All other systems were reviewed and are negative.   PHYSICAL EXAMINATION:  VITAL SIGNS:  Blood pressure is 116/61 with a  pulse of 70.  Weight is 124 pounds.  GENERAL:  The patient is oriented to person, time, and place.  Affect is  normal.  HEENT:  No xanthelasma.  She has normal extraocular motion.  NECK:  There is no jugular venous distention.  There is a left carotid  bruit.  CARDIAC:  S1 with an S2.  There are no clicks or significant murmurs.  ABDOMEN:  Soft.  EXTREMITIES:  She has no peripheral edema.   PROBLEMS:  Listed on my note of July 10, 2008.  She has good left ventricular function.  #4.  Left carotid bruit with  moderate carotid disease.  She will be  followed by Dr. Myra Gianotti.  #10.  Coronary artery disease, stable.  This will be followed.  #12.  Hyperlipidemia.  The patient is nicely controlled by history.  She  is on 80 mg of Zocor at this time.  The FDA has recently made a  recommendation to move away from the use of 80 mg of Zocor.  I have  explained this to the patient and we will reduce her dose to 40 mg.  She  will need followup lipid studies through her primary team.  If she has a  major change in her lipids, a more aggressive medicine can be used other  than simvastatin.  We will wait to see if there is any formal change in  the FDA recommendation.  She understands this fully.   She  is stable.  I will see her for Cardiology followup in 1 year.     Luis Abed, MD, [facility]  Electronically Signed    JDK/MedQ  DD: 09/07/2008  DT: 09/08/2008  Job #: [id number]   cc:   Donzetta Sprung

## 2010-10-28 NOTE — Assessment & Plan Note (Signed)
[facility]                          [facility] OFFICE NOTE   NAME:[name], [name]                    MRN:          [id number]  DATE:07/10/2008                            DOB:          08-12-1935    Mckenzie [name] is a pleasant 75 year old female.  She has known  coronary disease.  We have seen her in the past at [facility].  Her cardiology care has been primarily at [facility].  She was seen there  most recently in January 2009.  At that time, her cardiac status was  stable.  The patient had had perforated appendix and peritonitis in  2002.  She had a life-threatening illness and had ARDS.  She had  tracheostomy and recovered.  She had some cardiac abnormalities at that  time.  She was then followed up with a stress echo and she had wall  motion abnormalities.  She then received catheterization with a 90% mid  LAD and she received Multi-Link Zeta stent.  She had nonobstructive  disease elsewhere and she has been stable since then.  She did have a 2-  D echo done here in [place] on October 03, 2007.  Study showed mild-to-  moderate left ventricular hypertrophy.  Ejection fraction was 60%.  She  had some aortic valve sclerosis, but no stenosis.  There was mild mitral  regurgitation.  She has not had any type of exercise test since early in  2004.  There were no significant abnormalities then.   She does not have any significant chest pain or shortness of breath.  She is fully active.  She has not had any syncope or presyncope.   PAST MEDICAL HISTORY:   ALLERGIES:  TETRACYCLINE.   MEDICATIONS:  Plavix 75 mg every other day, aspirin (to be restarted  today).  She had been told previously at [facility] that it will be okay  for her to stop her Plavix.  Based on this, she cut her Plavix to every  other day and stopped her aspirin.  Aspirin will be resumed at 81 mg  daily.   MEDICATIONS:  1. Nexium 40.  2. Zocor 80.  3. Temazepam.  4.  Donnatal.  5. Benazepril 5.  6. Flomax.  7. Macrodantin.  8. Synthroid 75 mcg.  9. Vitamin D.  10.Calcium.  11.Multivitamin.  12.Fish oil.  13.Claritin.   OTHER MEDICAL PROBLEMS:  See the complete list below.   SOCIAL HISTORY:  The patient lives in the area.  She stopped smoking in  2002.  She is married.  She is retired [employer] operational officer.   FAMILY HISTORY:  There is no strong family history of coronary disease.   REVIEW OF SYSTEMS:  She does not have any significant GI or GU symptoms.  She has no headaches.  She has no fevers or chills or skin rashes.  Otherwise her review of systems is negative.   PHYSICAL EXAMINATION:  VITAL SIGNS:  Blood pressure is 107/56 with the  pulse of 60.  The patient is oriented to person, time, and place.  Affect is normal.  HEENT:  No xanthelasma.  She has normal  extraocular motion.  She has a  left carotid bruit.  There is no jugular venous distention.  LUNGS:  Clear.  Respiratory effort is not labored.  CARDIAC:  S1 with an S2.  There are no clicks or significant murmurs.  There is a 2/6 systolic murmur.  ABDOMEN:  Soft.  EXTREMITIES:  She has no significant peripheral edema.   EKG today is normal.   LABORATORY DATA:  Her last lipids that I have, include, triglycerides  83, HDL of 70, and LDL of 93, on full dose Zocor 80.   PROBLEMS:  1. Mild-to-moderate left ventricular hypertrophy by echo in 2009.  2. Aortic valve sclerosis with no stenosis.  3. History of mild mitral regurgitation.  4. Left carotid bruit heard today.  We will arrange for a carotid      Doppler for full assessment.  5. History of hypothyroidism.  6. Gastroesophageal reflux disease.  7. History of urinary tract infections on Macrodantin.  8. decreased bone density on appropriate medications.  9. History of an episode in 2002 of ruptured appendix with ARDS      requiring a tracheostomy.  10.Coronary artery disease with a 90% LAD lesion treated with stenting      at [facility]  in 2003.  11.History of normal left ventricular function as documented by echo      in 2009, with ejection fraction of 60%.   The patient will resume aspirin 81 mg daily.  The patient will resume  aspirin 81 mg.  She will have carotid Dopplers to assess her left  carotid bruit and she will have an adenosine Cardiolite scan.  I will  then see her back for followup.  She seems quite stable at this time.     Luis Abed, MD, [facility]  Electronically Signed    JDK/MedQ  DD: 07/10/2008  DT: 07/11/2008  Job #: [id number]   cc:   [facility].

## 2010-10-28 NOTE — Procedures (Signed)
CAROTID DUPLEX EXAM   INDICATION:  Carotid stenosis.   HISTORY:  Diabetes:  No.  Cardiac:  MI.  Hypertension:  Yes.  Smoking:  Previous.  Previous Surgery:  No.  CV History:  Asymptomatic.  Amaurosis Fugax Yes No, Paresthesias Yes No, Hemiparesis Yes No                                       RIGHT             LEFT  Brachial systolic pressure:  Brachial Doppler waveforms:  Vertebral direction of flow:        Antegrade         Antegrade  DUPLEX VELOCITIES (cm/sec)  CCA peak systolic                   98                104  ECA peak systolic                   125               100  ICA peak systolic                   110               147  ICA end diastolic                   39                55  PLAQUE MORPHOLOGY:                  None              Intimal wall  thickening  PLAQUE AMOUNT:                                        Minimal  PLAQUE LOCATION:                                      CCA   IMPRESSION:  1. No evidence of stenosis noted in the right internal carotid artery.  2. Left internal carotid artery velocities suggest a 40-59% stenosis.   ___________________________________________  V. Charlena Cross, MD   EM/MEDQ  D:  03/17/2010  T:  03/17/2010  Job:  [id number]

## 2010-10-28 NOTE — Procedures (Signed)
CAROTID DUPLEX EXAM   INDICATION:  Carotid disease.   HISTORY:  Diabetes:  No.  Cardiac:  MI.  Hypertension:  No.  Smoking:  Previous.  Previous Surgery:  Breast lumpectomy.  CV History:  Currently asymptomatic.  Amaurosis Fugax No, Paresthesias No, Hemiparesis No                                       RIGHT             LEFT  Brachial systolic pressure:  Brachial Doppler waveforms:         Normal            Normal  Vertebral direction of flow:        Antegrade         Antegrade  DUPLEX VELOCITIES (cm/sec)  CCA peak systolic                   106               88  ECA peak systolic                   94                68  ICA peak systolic                   85                96  ICA end diastolic                   24                23  PLAQUE MORPHOLOGY:                                    Heterogeneous  PLAQUE AMOUNT:                      None              Minimal  PLAQUE LOCATION:                                      ICA   IMPRESSION:  1. No evidence of stenosis noted in the right internal carotid artery      with mild intimal thickening noted in the right external carotid      artery and distal common carotid artery.  2. 1-39% stenosis of the left internal carotid artery.   ___________________________________________  V. Charlena Cross, MD   CH/MEDQ  D:  02/25/2009  T:  02/26/2009  Job:  [id number]

## 2010-11-03 ENCOUNTER — Encounter: Payer: Self-pay | Admitting: Cardiology

## 2010-11-03 DIAGNOSIS — I2510 Atherosclerotic heart disease of native coronary artery without angina pectoris: Secondary | ICD-10-CM | POA: Insufficient documentation

## 2010-11-05 ENCOUNTER — Encounter: Payer: Self-pay | Admitting: Cardiology

## 2010-11-05 ENCOUNTER — Ambulatory Visit (INDEPENDENT_AMBULATORY_CARE_PROVIDER_SITE_OTHER): Payer: Medicare Other | Admitting: Cardiology

## 2010-11-05 DIAGNOSIS — Z01818 Encounter for other preprocedural examination: Secondary | ICD-10-CM

## 2010-11-05 DIAGNOSIS — R002 Palpitations: Secondary | ICD-10-CM

## 2010-11-05 DIAGNOSIS — R0602 Shortness of breath: Secondary | ICD-10-CM

## 2010-11-05 DIAGNOSIS — I2510 Atherosclerotic heart disease of native coronary artery without angina pectoris: Secondary | ICD-10-CM

## 2010-11-05 NOTE — Progress Notes (Signed)
HPI Patient is seen for cardiology followup.  I saw her last Oct 16, 2010.  At that time after very careful consideration decision was made to proceed with cardiac catheterization.  This study was done maybe 8, 2012.  Ejection fraction was 65%.  This stent in the mid vessel the LAD was widely patent.  There was no other significant disease.  She is now back for final clearance for back surgery.  She is quite stable.  In addition she had some short bursts of supraventricular tachycardia.  She felt this with palpitations.  Low-dose Cardizem was added and she feels better.  This will be continued. Allergies  Allergen Reactions  . Sulfamethoxazole W/Trimethoprim   . Tetracycline     REACTION: nausea    Current Outpatient Prescriptions  Medication Sig Dispense Refill  . alendronate (FOSAMAX) 70 MG tablet Take one tablet on every Saturday.      Marland Kitchen aspirin 81 MG tablet Take 1 tablet by mouth daily.      Marland Kitchen atropine-PHENObarbital-scopolamine-hyoscyamine (DONNATAL) 16.2 MG per tablet Take 2 tablets by mouth at bedtime.        . benazepril (LOTENSIN) 5 MG tablet Take 1 tablet by mouth daily.      . Calcium Carbonate-Vit D-Min (CALCIUM 1200 PO) Take one tab daily       . Cholecalciferol (VITAMIN D3) 1000 UNITS CAPS Take one tab daily       . clopidogrel (PLAVIX) 75 MG tablet Take 1 tablet by mouth daily.       . Cobalamine Combinations (B-6 FOLIC ACID PO) Take by mouth. Take one tab daily       . conjugated estrogens (PREMARIN) vaginal cream Use as directed.       . diltiazem (TIAZAC) 120 MG 24 hr capsule Take 1 capsule (120 mg total) by mouth daily.  30 capsule  6  . docusate sodium (COLACE) 100 MG capsule 100 mg. Take 2 tabs twice a day       . esomeprazole (NEXIUM) 40 MG capsule Take 1 capsule by mouth daily.       . Ferrous Sulfate (PX IRON) 27 MG TABS Take by mouth. Take one tab daily       . hyoscyamine (LEVSIN SL) 0.125 MG SL tablet Use one tab sublingual every 6 hours as needed       .  levothyroxine (SYNTHROID, LEVOTHROID) 75 MCG tablet Take 1 tablet by mouth daily.       Marland Kitchen loratadine (CLARITIN) 10 MG tablet Take 1 tablet by mouth daily.      . Melatonin 3-10 MG TABS Take by mouth. Take one tab at bedtime       . mometasone (NASONEX) 50 MCG/ACT nasal spray 2 sprays by Nasal route as needed.        . Multiple Vitamin (MULTIVITAMIN) tablet Take 1 tablet by mouth daily.        . nitrofurantoin (MACRODANTIN) 100 MG capsule Take 1 tablet by mouth every night.       . nitroGLYCERIN (NITROSTAT) 0.4 MG SL tablet Place one tablet under tongue every 5 minutes up to 3 doses as needed for chest pain. No more than 3 doses over a 15 minute period.       . phenazopyridine (PYRIDIUM) 200 MG tablet Take one tab twice a day       . predniSONE (DELTASONE) 5 MG tablet Take 2.5mg  (1/2 tab) every other day       . raloxifene (EVISTA) 60 MG  tablet Take 1 tablet by mouth daily.      . simvastatin (ZOCOR) 80 MG tablet Take 1 tablet by mouth every night.       . Tamsulosin HCl (FLOMAX) 0.4 MG CAPS Take 1 capsule by mouth daily.       . temazepam (RESTORIL) 30 MG capsule Take 1 tablet by mouth every night.       . traMADol (ULTRAM) 50 MG tablet Take 50 mg by mouth every 6 (six) hours as needed. As needed for back pain.       Marland Kitchen tretinoin (RETIN-A) 0.05 % cream Apply topically at bedtime.          History   Social History  . Marital Status: Married    Spouse Name: N/A    Number of Children: N/A  . Years of Education: N/A   Occupational History  . Retired    Social History Main Topics  . Smoking status: Former Smoker    Quit date: 06/15/2000  . Smokeless tobacco: Not on file  . Alcohol Use: No  . Drug Use: No  . Sexually Active: Not on file   Other Topics Concern  . Not on file   Social History Narrative   Regular Exercise: Yes-Walk    Family History  Problem Relation Age of Onset  . COPD Other     Past Medical History  Diagnosis Date  . Left ventricular hypertrophy     EF  65%, echo, April, 2011  . CAD (coronary artery disease)     Stent to LAD, 2003 [facility] / nuclear scan April, 201 to artifact / persistent shortness of breath / catheterization Oct 21, 2010... widely patent stent to the mid LAD, no significant obstructive disease, should be low risk    for back surgery  . Aortic valve sclerosis     Mild, echo, April, 2012  . MR (mitral regurgitation)      Mild echo, 2012 ///  Mild per Echo 2009,  mild, echo, April, 2011  . Carotid arterial disease     Moderate-Dr. Myra Gianotti following  . GERD (gastroesophageal reflux disease)   . Hypothyroidism   . ARDS (adult respiratory distress syndrome) 2003    with tracheostomy-peritonitis  . UTI (urinary tract infection)     Chronic-on Macrodantin  . Hemolytic anemia     Followed by Dr. Derald Macleod  . Diastolic dysfunction     Echo, April, 2011   EF 65%  . Shortness of breath     March, 2012  . Palpitations     Holter, September 16, 2010, PACs and PVCs,one 4 beat run of SVT  . Back pain     Need for surgery, Dr. Trey Sailors  . Ejection fraction     EF 65%, echo, September 15, 2010    Past Surgical History  Procedure Date  . Appendectomy   . Breast lumpectomy   . Tonsillectomy   . Tracheostomy 2003    Following ARDS   . Thyroid surgery 08/2009    Benign adenomas    ROS  Patient denies fever, chills, headache, sweats, rash, change in vision, change in hearing, chest pain, cough, nausea vomiting, urinary symptoms.  She still has some shortness of breath.  All other systems are reviewed and are negative.  PHYSICAL EXAM Patient is oriented to person time and place.  Affect is normal.  Head is atraumatic.  There is no jugular venous distention.  Lungs are clear very aggressive toward Mitzie Na is not labored.  Cardiac exam reveals S1 and S2.  There is a soft systolic murmur.  The abdomen is soft.  There is no peripheral edema. Filed Vitals:   11/05/10 1345  BP: 110/64  Pulse: 75  Height: 5\' 2"  (1.575 m)  Weight: 126 lb 12.8 oz  (57.516 kg)    EKG No EKG is done today.  ASSESSMENT & PLAN

## 2010-11-05 NOTE — Assessment & Plan Note (Signed)
Her palpitations are helped by Cardizem.  This will be continued.

## 2010-11-05 NOTE — Assessment & Plan Note (Signed)
Her workup is now complete.  Her coronary status is completely stable.  She still has some shortness of breath.  However there is no obvious significant pathology.  She is stable and she is cleared for back surgery.

## 2010-11-05 NOTE — Cardiovascular Report (Signed)
  [name], Mckenzie [name] NO.:  [id number]  MEDICAL RECORD NO.:  [id number]          PATIENT TYPE:  LOCATION:                                 FACILITY:  PHYSICIAN:  Noralyn Pick. Eden Emms, MD, [facility]   DATE OF BIRTH:  DATE OF PROCEDURE: DATE OF DISCHARGE:                           CARDIAC CATHETERIZATION   INDICATIONS:  A 75 year old patient who requires back surgery, previous history of stent to the mid LAD in 2003 at [facility], abnormal Myoview. Study was done to clear the patient for surgery.  Cine catheterization was done with 4-French catheter from the right femoral artery.  At the end of the case, the patient did have a small hematoma.  She had no pain and initially hemostasis, after sheath pull, was normal.  Standard JR-4 and JL-4 catheters were used.  Left main coronary artery was normal.  Left anterior descending artery was normal.  There was a stent in the mid LAD which was widely patent.  The patient had 4 small diagonal branches which were normal.  Circumflex coronary artery was normal.  It primarily consisted of the proximal and mid body and a single obtuse marginal branch which were all normal.  The right coronary artery was dominant.  There was 20-30% eccentric disease in the proximal and distal vessel, but no significant stenoses.  There was a large posterolateral branch and the PDA which were normal.  RAO ventriculography.  RAO ventriculography was normal.  EF was 65%. There was no gradient across the aortic valve and no MR.  Aortic pressure was 123/70, LV pressure was 123/8.  IMPRESSION:  The patient has no significant coronary artery disease, stent in the midvessel of the left anterior descending coronary artery was widely patent.  She should be low risk for back surgery.  She will be discharged later today as long as her groin continues to improve.     Noralyn Pick. Eden Emms, MD, [facility]     PCN/MEDQ  D:  10/21/2010  T:  10/21/2010  Job:   [id number]  cc:   Payton Doughty, M.D. Learta Codding, MD,FACC  Electronically Signed by Charlton Haws MD [facility] on 11/05/2010 03:41:55 PM

## 2010-11-05 NOTE — Assessment & Plan Note (Signed)
Etiology of her shortness of breath is not clear.  However there is no significant risk.  This can be followed over time.

## 2010-11-05 NOTE — Assessment & Plan Note (Signed)
Coronary disease is stable.  No further workup. 

## 2010-11-05 NOTE — Patient Instructions (Signed)
Your physician wants you to follow-up in: 6 months. You will receive a reminder letter in the mail one-two months in advance. If you don't receive a letter, please call our office to schedule the follow-up appointment. You are cleared for surgery with Dr. Channing Mutters. Your physician recommends that you continue on your current medications as directed. Please refer to the Current Medication list given to you today.

## 2011-02-27 ENCOUNTER — Other Ambulatory Visit: Payer: Self-pay | Admitting: *Deleted

## 2011-02-27 MED ORDER — DILTIAZEM HCL ER BEADS 120 MG PO CP24: 120.0000 mg | ORAL_CAPSULE | Freq: Every day | ORAL | Status: DC

## 2011-04-28 ENCOUNTER — Telehealth: Payer: Self-pay | Admitting: Cardiology

## 2011-04-28 NOTE — Telephone Encounter (Signed)
Dr. Laurian Brim sent request over requesting to see if Mckenzie [name] can come off Plavix for 7 days In order to have a steroid injection. They are looking at November 30th to do injection if approved.  Form will be placed in your mail box in [facility].

## 2011-05-01 NOTE — Telephone Encounter (Signed)
Mckenzie [name], I should have sent this to our [facility] team.  I will send to them.  you should plan not to do anything with this

## 2011-05-01 NOTE — Telephone Encounter (Signed)
Okay to hold Plavix for the procedure with Dr. Laurian Brim.

## 2011-05-01 NOTE — Telephone Encounter (Signed)
Okay to hold Plavix 

## 2011-05-11 ENCOUNTER — Encounter: Payer: Self-pay | Admitting: Cardiology

## 2011-05-11 ENCOUNTER — Ambulatory Visit (INDEPENDENT_AMBULATORY_CARE_PROVIDER_SITE_OTHER): Payer: Medicare Other | Admitting: Cardiology

## 2011-05-11 DIAGNOSIS — I2510 Atherosclerotic heart disease of native coronary artery without angina pectoris: Secondary | ICD-10-CM

## 2011-05-11 DIAGNOSIS — R0602 Shortness of breath: Secondary | ICD-10-CM

## 2011-05-11 DIAGNOSIS — R002 Palpitations: Secondary | ICD-10-CM

## 2011-05-11 NOTE — Assessment & Plan Note (Signed)
She is not having any significant shortness of breath at this time.  No further workup.

## 2011-05-11 NOTE — Assessment & Plan Note (Signed)
Coronary disease is stable.  We know her catheter data from May, 2012. She's not having any significant symptoms.  No further workup.

## 2011-05-11 NOTE — Progress Notes (Signed)
HPI Patient is seen in followup coronary artery disease and palpitations.  I saw her last in May, 2012.  She had undergone cardiac catheterization and it showed that the stent in her mid LAD was widely patent.  Her ejection fraction was 65%.  She was quite stable and she was cleared to proceed with back surgery.  This was done and she had resolution of the numbness in her right leg.  Unfortunately she now has pain in her left side.  She may have an injection done for this.  She's not having any chest pain.  Her palpitations remained stable Cardizem.  She's not having any significant shortness of breath.  Allergies  Allergen Reactions  . Sulfamethoxazole W/Trimethoprim   . Tetracycline     REACTION: nausea    Current Outpatient Prescriptions  Medication Sig Dispense Refill  . alendronate (FOSAMAX) 70 MG tablet Take one tablet on every Saturday.      Marland Kitchen aspirin 81 MG tablet Take 1 tablet by mouth daily.      Marland Kitchen atropine-PHENObarbital-scopolamine-hyoscyamine (DONNATAL) 16.2 MG per tablet Take 2 tablets by mouth at bedtime.        . benazepril (LOTENSIN) 5 MG tablet Take 1 tablet by mouth daily.      . Calcium Carbonate-Vit D-Min (CALCIUM 1200 PO) Take one tab daily       . Cholecalciferol (VITAMIN D3) 1000 UNITS CAPS Take one tab daily       . clopidogrel (PLAVIX) 75 MG tablet Take 1 tablet by mouth daily.       . Cobalamine Combinations (B-6 FOLIC ACID PO) Take by mouth. Take one tab daily       . conjugated estrogens (PREMARIN) vaginal cream Use as directed.       . diltiazem (TIAZAC) 120 MG 24 hr capsule Take 1 capsule (120 mg total) by mouth daily.  30 capsule  6  . diphenhydrAMINE (BENADRYL) 25 mg capsule Take 25 mg by mouth at bedtime.        . docusate sodium (COLACE) 100 MG capsule 100 mg. Take 2 tabs twice a day       . esomeprazole (NEXIUM) 40 MG capsule Take 1 capsule by mouth daily.       . Ferrous Sulfate (PX IRON) 27 MG TABS Take by mouth. Take one tab daily       . hyoscyamine  (LEVSIN SL) 0.125 MG SL tablet Use one tab sublingual every 6 hours as needed       . levothyroxine (SYNTHROID, LEVOTHROID) 75 MCG tablet Take 1 tablet by mouth daily.       Marland Kitchen loratadine (CLARITIN) 10 MG tablet Take 1 tablet by mouth daily.      . Melatonin 3-10 MG TABS Take 1 tablet by mouth at bedtime.       . mometasone (NASONEX) 50 MCG/ACT nasal spray Place 2 sprays into the nose as needed.        . Multiple Vitamin (MULTIVITAMIN) tablet Take 1 tablet by mouth daily.        . nitrofurantoin (MACRODANTIN) 100 MG capsule Take 1 tablet by mouth every night.       . nitroGLYCERIN (NITROSTAT) 0.4 MG SL tablet Place one tablet under tongue every 5 minutes up to 3 doses as needed for chest pain. No more than 3 doses over a 15 minute period.       Marland Kitchen oxyCODONE-acetaminophen (PERCOCET) 5-325 MG per tablet Take 1-2 tablets by mouth every 6 (six) hours as  needed.        . phenazopyridine (PYRIDIUM) 200 MG tablet Take one tab twice a day       . predniSONE (DELTASONE) 5 MG tablet Take 2.5mg  (1/2 tab) every other day       . raloxifene (EVISTA) 60 MG tablet Take 1 tablet by mouth daily.      . simvastatin (ZOCOR) 80 MG tablet Take 1 tablet by mouth every night.       . Tamsulosin HCl (FLOMAX) 0.4 MG CAPS Take 1 capsule by mouth daily.       . temazepam (RESTORIL) 30 MG capsule Take 30 mg by mouth at bedtime as needed. Take 1 tablet by mouth every night.      . tretinoin (RETIN-A) 0.05 % cream Apply topically at bedtime.          History   Social History  . Marital Status: Married    Spouse Name: N/A    Number of Children: N/A  . Years of Education: N/A   Occupational History  . Retired    Social History Main Topics  . Smoking status: Former Smoker -- 1.0 packs/day for 20 years    Types: Cigarettes    Quit date: 06/15/2000  . Smokeless tobacco: Never Used  . Alcohol Use: No  . Drug Use: No  . Sexually Active: Not on file   Other Topics Concern  . Not on file   Social History Narrative     Regular Exercise: Yes-Walk    Family History  Problem Relation Age of Onset  . COPD Other     Past Medical History  Diagnosis Date  . Left ventricular hypertrophy     EF 65%, echo, April, 2011  . CAD (coronary artery disease)     Stent to LAD, 2003 [facility] / nuclear scan April, 201 to artifact / persistent shortness of breath / catheterization Oct 21, 2010... widely patent stent to the mid LAD, no significant obstructive disease, should be low risk    for back surgery  . Aortic valve sclerosis     Mild, echo, April, 2012  . MR (mitral regurgitation)      Mild echo, 2012 ///  Mild per Echo 2009,  mild, echo, April, 2011  . Carotid arterial disease     Moderate-Dr. Myra Gianotti following  . GERD (gastroesophageal reflux disease)   . Hypothyroidism   . ARDS (adult respiratory distress syndrome) 2003    with tracheostomy-peritonitis  . UTI (urinary tract infection)     Chronic-on Macrodantin  . Hemolytic anemia     Followed by Dr. Derald Macleod  . Diastolic dysfunction     Echo, April, 2011   EF 65%  . Shortness of breath     March, 2012  . Palpitations     Holter, September 16, 2010, PACs and PVCs,one 4 beat run of SVT  . Back pain     Need for surgery, Dr. Trey Sailors  . Ejection fraction     EF 65%, echo, September 15, 2010  . Preoperative clearance     Preop clearance for back surgery    Past Surgical History  Procedure Date  . Appendectomy   . Breast lumpectomy   . Tonsillectomy   . Tracheostomy 2003    Following ARDS   . Thyroid surgery 08/2009    Benign adenomas    ROS  Patient denies fever, chills, headache, sweats,, change in vision, change in hearing, chest pain, cough, nausea vomiting, urinary symptoms.  All  other systems are reviewed and are negative  PHYSICAL EXAM Patient's quite stable.  She is oriented to person time and place.  Affect is normal.  There is no jugular venous stent.  Lungs are clear.  Respiratory effort is nonlabored.  Cardiac exam reveals S1 and S2.  There  is a 2/6 crescendo decrescendo murmur consistent with her aortic valve sclerosis.  She also has carotid artery disease and is followed by vascular surgery.  After soft.  There is no peripheral edema  Filed Vitals:   05/11/11 1423  BP: 115/65  Pulse: 71  Height: 5\' 2"  (1.575 m)  Weight: 120 lb (54.432 kg)    ASSESSMENT & PLAN

## 2011-05-11 NOTE — Assessment & Plan Note (Signed)
Palpitations are not causing her to cough.  No change in medicines.  No further workup.  I will see her for cardiology followup in one year,

## 2011-05-11 NOTE — Patient Instructions (Signed)
Continue all current medications. Your physician wants you to follow up in:  1 year.  You will receive a reminder letter in the mail one-two months in advance.  If you don't receive a letter, please call our office to schedule the follow up appointment   

## 2011-06-23 DIAGNOSIS — I499 Cardiac arrhythmia, unspecified: Secondary | ICD-10-CM | POA: Diagnosis not present

## 2011-06-23 DIAGNOSIS — M48062 Spinal stenosis, lumbar region with neurogenic claudication: Secondary | ICD-10-CM | POA: Diagnosis not present

## 2011-06-23 DIAGNOSIS — E7800 Pure hypercholesterolemia, unspecified: Secondary | ICD-10-CM | POA: Diagnosis not present

## 2011-06-23 DIAGNOSIS — Z79899 Other long term (current) drug therapy: Secondary | ICD-10-CM | POA: Diagnosis not present

## 2011-06-23 DIAGNOSIS — G5700 Lesion of sciatic nerve, unspecified lower limb: Secondary | ICD-10-CM | POA: Diagnosis not present

## 2011-06-23 DIAGNOSIS — IMO0001 Reserved for inherently not codable concepts without codable children: Secondary | ICD-10-CM | POA: Diagnosis not present

## 2011-06-23 DIAGNOSIS — I10 Essential (primary) hypertension: Secondary | ICD-10-CM | POA: Diagnosis not present

## 2011-06-23 DIAGNOSIS — I252 Old myocardial infarction: Secondary | ICD-10-CM | POA: Diagnosis not present

## 2011-06-23 DIAGNOSIS — M5137 Other intervertebral disc degeneration, lumbosacral region: Secondary | ICD-10-CM | POA: Diagnosis not present

## 2011-06-23 DIAGNOSIS — M47817 Spondylosis without myelopathy or radiculopathy, lumbosacral region: Secondary | ICD-10-CM | POA: Diagnosis not present

## 2011-06-23 DIAGNOSIS — Z7902 Long term (current) use of antithrombotics/antiplatelets: Secondary | ICD-10-CM | POA: Diagnosis not present

## 2011-06-23 DIAGNOSIS — E039 Hypothyroidism, unspecified: Secondary | ICD-10-CM | POA: Diagnosis not present

## 2011-07-01 DIAGNOSIS — E042 Nontoxic multinodular goiter: Secondary | ICD-10-CM | POA: Diagnosis not present

## 2011-07-01 DIAGNOSIS — M810 Age-related osteoporosis without current pathological fracture: Secondary | ICD-10-CM | POA: Diagnosis not present

## 2011-07-01 DIAGNOSIS — E039 Hypothyroidism, unspecified: Secondary | ICD-10-CM | POA: Diagnosis not present

## 2011-07-01 DIAGNOSIS — E041 Nontoxic single thyroid nodule: Secondary | ICD-10-CM | POA: Diagnosis not present

## 2011-07-01 DIAGNOSIS — E890 Postprocedural hypothyroidism: Secondary | ICD-10-CM | POA: Diagnosis not present

## 2011-07-01 DIAGNOSIS — M8920 Other disorders of bone development and growth, unspecified site: Secondary | ICD-10-CM | POA: Diagnosis not present

## 2011-07-08 DIAGNOSIS — IMO0002 Reserved for concepts with insufficient information to code with codable children: Secondary | ICD-10-CM | POA: Diagnosis not present

## 2011-07-08 DIAGNOSIS — M4716 Other spondylosis with myelopathy, lumbar region: Secondary | ICD-10-CM | POA: Diagnosis not present

## 2011-07-08 DIAGNOSIS — M47817 Spondylosis without myelopathy or radiculopathy, lumbosacral region: Secondary | ICD-10-CM | POA: Diagnosis not present

## 2011-07-08 DIAGNOSIS — M5137 Other intervertebral disc degeneration, lumbosacral region: Secondary | ICD-10-CM | POA: Diagnosis not present

## 2011-07-13 DIAGNOSIS — Z9889 Other specified postprocedural states: Secondary | ICD-10-CM | POA: Diagnosis not present

## 2011-07-13 DIAGNOSIS — M4120 Other idiopathic scoliosis, site unspecified: Secondary | ICD-10-CM | POA: Diagnosis not present

## 2011-07-13 DIAGNOSIS — M47817 Spondylosis without myelopathy or radiculopathy, lumbosacral region: Secondary | ICD-10-CM | POA: Diagnosis not present

## 2011-07-13 DIAGNOSIS — M5126 Other intervertebral disc displacement, lumbar region: Secondary | ICD-10-CM | POA: Diagnosis not present

## 2011-07-13 DIAGNOSIS — M5137 Other intervertebral disc degeneration, lumbosacral region: Secondary | ICD-10-CM | POA: Diagnosis not present

## 2011-07-21 DIAGNOSIS — M47817 Spondylosis without myelopathy or radiculopathy, lumbosacral region: Secondary | ICD-10-CM | POA: Diagnosis not present

## 2011-07-29 DIAGNOSIS — IMO0001 Reserved for inherently not codable concepts without codable children: Secondary | ICD-10-CM | POA: Diagnosis not present

## 2011-07-29 DIAGNOSIS — M47817 Spondylosis without myelopathy or radiculopathy, lumbosacral region: Secondary | ICD-10-CM | POA: Diagnosis not present

## 2011-07-30 DIAGNOSIS — M810 Age-related osteoporosis without current pathological fracture: Secondary | ICD-10-CM | POA: Diagnosis not present

## 2011-07-30 DIAGNOSIS — Z7982 Long term (current) use of aspirin: Secondary | ICD-10-CM | POA: Diagnosis not present

## 2011-07-30 DIAGNOSIS — Z79899 Other long term (current) drug therapy: Secondary | ICD-10-CM | POA: Diagnosis not present

## 2011-07-30 DIAGNOSIS — IMO0002 Reserved for concepts with insufficient information to code with codable children: Secondary | ICD-10-CM | POA: Diagnosis not present

## 2011-07-30 DIAGNOSIS — M519 Unspecified thoracic, thoracolumbar and lumbosacral intervertebral disc disorder: Secondary | ICD-10-CM | POA: Diagnosis not present

## 2011-07-30 DIAGNOSIS — D599 Acquired hemolytic anemia, unspecified: Secondary | ICD-10-CM | POA: Diagnosis not present

## 2011-08-03 DIAGNOSIS — M4716 Other spondylosis with myelopathy, lumbar region: Secondary | ICD-10-CM | POA: Diagnosis not present

## 2011-08-03 DIAGNOSIS — IMO0002 Reserved for concepts with insufficient information to code with codable children: Secondary | ICD-10-CM | POA: Diagnosis not present

## 2011-08-03 DIAGNOSIS — M5137 Other intervertebral disc degeneration, lumbosacral region: Secondary | ICD-10-CM | POA: Diagnosis not present

## 2011-08-03 DIAGNOSIS — M47817 Spondylosis without myelopathy or radiculopathy, lumbosacral region: Secondary | ICD-10-CM | POA: Diagnosis not present

## 2011-08-12 ENCOUNTER — Telehealth: Payer: Self-pay | Admitting: Cardiology

## 2011-08-13 DIAGNOSIS — Z9861 Coronary angioplasty status: Secondary | ICD-10-CM | POA: Diagnosis not present

## 2011-08-13 DIAGNOSIS — I252 Old myocardial infarction: Secondary | ICD-10-CM | POA: Diagnosis not present

## 2011-08-13 DIAGNOSIS — Z7902 Long term (current) use of antithrombotics/antiplatelets: Secondary | ICD-10-CM | POA: Diagnosis not present

## 2011-08-13 DIAGNOSIS — E039 Hypothyroidism, unspecified: Secondary | ICD-10-CM | POA: Diagnosis not present

## 2011-08-13 DIAGNOSIS — Z79899 Other long term (current) drug therapy: Secondary | ICD-10-CM | POA: Diagnosis not present

## 2011-08-13 DIAGNOSIS — IMO0001 Reserved for inherently not codable concepts without codable children: Secondary | ICD-10-CM | POA: Diagnosis not present

## 2011-08-13 DIAGNOSIS — I10 Essential (primary) hypertension: Secondary | ICD-10-CM | POA: Diagnosis not present

## 2011-08-13 DIAGNOSIS — G5700 Lesion of sciatic nerve, unspecified lower limb: Secondary | ICD-10-CM | POA: Diagnosis not present

## 2011-08-13 DIAGNOSIS — M5137 Other intervertebral disc degeneration, lumbosacral region: Secondary | ICD-10-CM | POA: Diagnosis not present

## 2011-08-13 DIAGNOSIS — M47817 Spondylosis without myelopathy or radiculopathy, lumbosacral region: Secondary | ICD-10-CM | POA: Diagnosis not present

## 2011-08-13 DIAGNOSIS — E7800 Pure hypercholesterolemia, unspecified: Secondary | ICD-10-CM | POA: Diagnosis not present

## 2011-08-19 DIAGNOSIS — M47817 Spondylosis without myelopathy or radiculopathy, lumbosacral region: Secondary | ICD-10-CM | POA: Diagnosis not present

## 2011-09-02 DIAGNOSIS — IMO0002 Reserved for concepts with insufficient information to code with codable children: Secondary | ICD-10-CM | POA: Diagnosis not present

## 2011-09-02 DIAGNOSIS — M47817 Spondylosis without myelopathy or radiculopathy, lumbosacral region: Secondary | ICD-10-CM | POA: Diagnosis not present

## 2011-09-02 DIAGNOSIS — M4716 Other spondylosis with myelopathy, lumbar region: Secondary | ICD-10-CM | POA: Diagnosis not present

## 2011-09-02 DIAGNOSIS — M5137 Other intervertebral disc degeneration, lumbosacral region: Secondary | ICD-10-CM | POA: Diagnosis not present

## 2011-09-07 DIAGNOSIS — J019 Acute sinusitis, unspecified: Secondary | ICD-10-CM | POA: Diagnosis not present

## 2011-09-07 DIAGNOSIS — J449 Chronic obstructive pulmonary disease, unspecified: Secondary | ICD-10-CM | POA: Diagnosis not present

## 2011-09-14 DIAGNOSIS — I951 Orthostatic hypotension: Secondary | ICD-10-CM | POA: Diagnosis not present

## 2011-09-14 DIAGNOSIS — R5381 Other malaise: Secondary | ICD-10-CM | POA: Diagnosis not present

## 2011-09-14 DIAGNOSIS — R197 Diarrhea, unspecified: Secondary | ICD-10-CM | POA: Diagnosis not present

## 2011-09-14 DIAGNOSIS — R300 Dysuria: Secondary | ICD-10-CM | POA: Diagnosis not present

## 2011-09-14 DIAGNOSIS — R5383 Other fatigue: Secondary | ICD-10-CM | POA: Diagnosis not present

## 2011-09-15 DIAGNOSIS — Z9861 Coronary angioplasty status: Secondary | ICD-10-CM | POA: Diagnosis not present

## 2011-09-15 DIAGNOSIS — E119 Type 2 diabetes mellitus without complications: Secondary | ICD-10-CM | POA: Diagnosis not present

## 2011-09-15 DIAGNOSIS — G459 Transient cerebral ischemic attack, unspecified: Secondary | ICD-10-CM | POA: Diagnosis not present

## 2011-09-15 DIAGNOSIS — I059 Rheumatic mitral valve disease, unspecified: Secondary | ICD-10-CM | POA: Diagnosis not present

## 2011-09-15 DIAGNOSIS — I2510 Atherosclerotic heart disease of native coronary artery without angina pectoris: Secondary | ICD-10-CM | POA: Diagnosis not present

## 2011-09-15 DIAGNOSIS — G319 Degenerative disease of nervous system, unspecified: Secondary | ICD-10-CM | POA: Diagnosis not present

## 2011-09-15 DIAGNOSIS — R209 Unspecified disturbances of skin sensation: Secondary | ICD-10-CM | POA: Diagnosis not present

## 2011-09-15 DIAGNOSIS — M1990 Unspecified osteoarthritis, unspecified site: Secondary | ICD-10-CM | POA: Diagnosis not present

## 2011-09-15 DIAGNOSIS — R011 Cardiac murmur, unspecified: Secondary | ICD-10-CM | POA: Diagnosis not present

## 2011-09-15 DIAGNOSIS — I6789 Other cerebrovascular disease: Secondary | ICD-10-CM | POA: Diagnosis not present

## 2011-09-15 DIAGNOSIS — E7800 Pure hypercholesterolemia, unspecified: Secondary | ICD-10-CM | POA: Diagnosis not present

## 2011-09-15 DIAGNOSIS — E039 Hypothyroidism, unspecified: Secondary | ICD-10-CM | POA: Diagnosis not present

## 2011-09-15 DIAGNOSIS — I10 Essential (primary) hypertension: Secondary | ICD-10-CM | POA: Diagnosis not present

## 2011-09-15 DIAGNOSIS — Z7902 Long term (current) use of antithrombotics/antiplatelets: Secondary | ICD-10-CM | POA: Diagnosis not present

## 2011-09-15 DIAGNOSIS — D599 Acquired hemolytic anemia, unspecified: Secondary | ICD-10-CM | POA: Diagnosis not present

## 2011-09-15 DIAGNOSIS — J309 Allergic rhinitis, unspecified: Secondary | ICD-10-CM | POA: Diagnosis not present

## 2011-09-15 DIAGNOSIS — Z79899 Other long term (current) drug therapy: Secondary | ICD-10-CM | POA: Diagnosis not present

## 2011-09-15 DIAGNOSIS — K589 Irritable bowel syndrome without diarrhea: Secondary | ICD-10-CM | POA: Diagnosis not present

## 2011-09-15 DIAGNOSIS — N951 Menopausal and female climacteric states: Secondary | ICD-10-CM | POA: Diagnosis not present

## 2011-09-15 DIAGNOSIS — R197 Diarrhea, unspecified: Secondary | ICD-10-CM | POA: Diagnosis not present

## 2011-09-15 DIAGNOSIS — K219 Gastro-esophageal reflux disease without esophagitis: Secondary | ICD-10-CM | POA: Diagnosis not present

## 2011-09-15 DIAGNOSIS — G4700 Insomnia, unspecified: Secondary | ICD-10-CM | POA: Diagnosis not present

## 2011-09-15 DIAGNOSIS — K8020 Calculus of gallbladder without cholecystitis without obstruction: Secondary | ICD-10-CM | POA: Diagnosis not present

## 2011-09-15 DIAGNOSIS — Z7982 Long term (current) use of aspirin: Secondary | ICD-10-CM | POA: Diagnosis not present

## 2011-09-15 DIAGNOSIS — R112 Nausea with vomiting, unspecified: Secondary | ICD-10-CM | POA: Diagnosis not present

## 2011-09-15 DIAGNOSIS — M810 Age-related osteoporosis without current pathological fracture: Secondary | ICD-10-CM | POA: Diagnosis not present

## 2011-09-16 DIAGNOSIS — I658 Occlusion and stenosis of other precerebral arteries: Secondary | ICD-10-CM | POA: Diagnosis not present

## 2011-09-16 DIAGNOSIS — K8020 Calculus of gallbladder without cholecystitis without obstruction: Secondary | ICD-10-CM | POA: Diagnosis not present

## 2011-09-16 DIAGNOSIS — R011 Cardiac murmur, unspecified: Secondary | ICD-10-CM | POA: Diagnosis not present

## 2011-09-16 DIAGNOSIS — G319 Degenerative disease of nervous system, unspecified: Secondary | ICD-10-CM | POA: Diagnosis not present

## 2011-09-16 DIAGNOSIS — G459 Transient cerebral ischemic attack, unspecified: Secondary | ICD-10-CM | POA: Diagnosis not present

## 2011-09-16 DIAGNOSIS — K668 Other specified disorders of peritoneum: Secondary | ICD-10-CM | POA: Diagnosis not present

## 2011-09-16 DIAGNOSIS — I10 Essential (primary) hypertension: Secondary | ICD-10-CM | POA: Diagnosis not present

## 2011-09-16 DIAGNOSIS — K828 Other specified diseases of gallbladder: Secondary | ICD-10-CM | POA: Diagnosis not present

## 2011-09-24 DIAGNOSIS — M545 Low back pain, unspecified: Secondary | ICD-10-CM | POA: Diagnosis not present

## 2011-09-24 DIAGNOSIS — R0602 Shortness of breath: Secondary | ICD-10-CM | POA: Diagnosis not present

## 2011-09-24 DIAGNOSIS — I10 Essential (primary) hypertension: Secondary | ICD-10-CM | POA: Diagnosis not present

## 2011-09-25 DIAGNOSIS — R0609 Other forms of dyspnea: Secondary | ICD-10-CM | POA: Diagnosis not present

## 2011-09-25 DIAGNOSIS — M47817 Spondylosis without myelopathy or radiculopathy, lumbosacral region: Secondary | ICD-10-CM | POA: Diagnosis not present

## 2011-09-25 DIAGNOSIS — J4489 Other specified chronic obstructive pulmonary disease: Secondary | ICD-10-CM | POA: Diagnosis not present

## 2011-09-25 DIAGNOSIS — J449 Chronic obstructive pulmonary disease, unspecified: Secondary | ICD-10-CM | POA: Diagnosis not present

## 2011-09-25 DIAGNOSIS — J984 Other disorders of lung: Secondary | ICD-10-CM | POA: Diagnosis not present

## 2011-09-28 ENCOUNTER — Encounter: Payer: Self-pay | Admitting: Cardiology

## 2011-09-28 DIAGNOSIS — M810 Age-related osteoporosis without current pathological fracture: Secondary | ICD-10-CM | POA: Diagnosis not present

## 2011-09-28 DIAGNOSIS — M549 Dorsalgia, unspecified: Secondary | ICD-10-CM | POA: Diagnosis not present

## 2011-09-28 DIAGNOSIS — D599 Acquired hemolytic anemia, unspecified: Secondary | ICD-10-CM | POA: Diagnosis not present

## 2011-09-28 DIAGNOSIS — M1990 Unspecified osteoarthritis, unspecified site: Secondary | ICD-10-CM | POA: Diagnosis not present

## 2011-09-28 NOTE — Progress Notes (Signed)
I was in touch today by telephone with Dr. Channing Mutters. He is considering back surgery. I made it clear that it is safe for the patient to come off her Plavix for this procedure.

## 2011-09-29 DIAGNOSIS — J449 Chronic obstructive pulmonary disease, unspecified: Secondary | ICD-10-CM | POA: Diagnosis not present

## 2011-09-30 ENCOUNTER — Other Ambulatory Visit: Payer: Self-pay | Admitting: Cardiology

## 2011-10-20 DIAGNOSIS — R609 Edema, unspecified: Secondary | ICD-10-CM | POA: Diagnosis not present

## 2011-10-21 DIAGNOSIS — M5137 Other intervertebral disc degeneration, lumbosacral region: Secondary | ICD-10-CM | POA: Diagnosis not present

## 2011-10-21 DIAGNOSIS — IMO0002 Reserved for concepts with insufficient information to code with codable children: Secondary | ICD-10-CM | POA: Diagnosis not present

## 2011-10-21 DIAGNOSIS — M4716 Other spondylosis with myelopathy, lumbar region: Secondary | ICD-10-CM | POA: Diagnosis not present

## 2011-10-21 DIAGNOSIS — M47817 Spondylosis without myelopathy or radiculopathy, lumbosacral region: Secondary | ICD-10-CM | POA: Diagnosis not present

## 2011-11-12 DIAGNOSIS — M47817 Spondylosis without myelopathy or radiculopathy, lumbosacral region: Secondary | ICD-10-CM | POA: Diagnosis not present

## 2011-11-14 DIAGNOSIS — J189 Pneumonia, unspecified organism: Secondary | ICD-10-CM

## 2011-11-14 HISTORY — DX: Pneumonia, unspecified organism: J18.9

## 2011-11-16 DIAGNOSIS — G8929 Other chronic pain: Secondary | ICD-10-CM | POA: Diagnosis present

## 2011-11-16 DIAGNOSIS — E039 Hypothyroidism, unspecified: Secondary | ICD-10-CM | POA: Diagnosis present

## 2011-11-16 DIAGNOSIS — J4489 Other specified chronic obstructive pulmonary disease: Secondary | ICD-10-CM | POA: Diagnosis present

## 2011-11-16 DIAGNOSIS — J449 Chronic obstructive pulmonary disease, unspecified: Secondary | ICD-10-CM | POA: Diagnosis not present

## 2011-11-16 DIAGNOSIS — Z883 Allergy status to other anti-infective agents status: Secondary | ICD-10-CM | POA: Diagnosis not present

## 2011-11-16 DIAGNOSIS — J180 Bronchopneumonia, unspecified organism: Secondary | ICD-10-CM | POA: Diagnosis not present

## 2011-11-16 DIAGNOSIS — IMO0002 Reserved for concepts with insufficient information to code with codable children: Secondary | ICD-10-CM | POA: Diagnosis not present

## 2011-11-16 DIAGNOSIS — J9600 Acute respiratory failure, unspecified whether with hypoxia or hypercapnia: Secondary | ICD-10-CM | POA: Diagnosis not present

## 2011-11-16 DIAGNOSIS — Z9861 Coronary angioplasty status: Secondary | ICD-10-CM | POA: Diagnosis not present

## 2011-11-16 DIAGNOSIS — Z886 Allergy status to analgesic agent status: Secondary | ICD-10-CM | POA: Diagnosis not present

## 2011-11-16 DIAGNOSIS — J189 Pneumonia, unspecified organism: Secondary | ICD-10-CM | POA: Diagnosis not present

## 2011-11-16 DIAGNOSIS — R0602 Shortness of breath: Secondary | ICD-10-CM | POA: Diagnosis not present

## 2011-11-16 DIAGNOSIS — E785 Hyperlipidemia, unspecified: Secondary | ICD-10-CM | POA: Diagnosis present

## 2011-11-16 DIAGNOSIS — Z882 Allergy status to sulfonamides status: Secondary | ICD-10-CM | POA: Diagnosis not present

## 2011-11-16 DIAGNOSIS — J9819 Other pulmonary collapse: Secondary | ICD-10-CM | POA: Diagnosis not present

## 2011-11-16 DIAGNOSIS — Z79899 Other long term (current) drug therapy: Secondary | ICD-10-CM | POA: Diagnosis not present

## 2011-11-16 DIAGNOSIS — M1990 Unspecified osteoarthritis, unspecified site: Secondary | ICD-10-CM | POA: Diagnosis present

## 2011-11-16 DIAGNOSIS — R0609 Other forms of dyspnea: Secondary | ICD-10-CM | POA: Diagnosis not present

## 2011-11-16 DIAGNOSIS — J90 Pleural effusion, not elsewhere classified: Secondary | ICD-10-CM | POA: Diagnosis not present

## 2011-11-16 DIAGNOSIS — D599 Acquired hemolytic anemia, unspecified: Secondary | ICD-10-CM | POA: Diagnosis not present

## 2011-11-16 DIAGNOSIS — R509 Fever, unspecified: Secondary | ICD-10-CM | POA: Diagnosis not present

## 2011-11-16 DIAGNOSIS — J984 Other disorders of lung: Secondary | ICD-10-CM | POA: Diagnosis not present

## 2011-11-16 DIAGNOSIS — K219 Gastro-esophageal reflux disease without esophagitis: Secondary | ICD-10-CM | POA: Diagnosis not present

## 2011-11-16 DIAGNOSIS — F17200 Nicotine dependence, unspecified, uncomplicated: Secondary | ICD-10-CM | POA: Diagnosis present

## 2011-11-16 DIAGNOSIS — M810 Age-related osteoporosis without current pathological fracture: Secondary | ICD-10-CM | POA: Diagnosis present

## 2011-11-16 DIAGNOSIS — I4891 Unspecified atrial fibrillation: Secondary | ICD-10-CM | POA: Diagnosis not present

## 2011-11-16 DIAGNOSIS — G4700 Insomnia, unspecified: Secondary | ICD-10-CM | POA: Diagnosis present

## 2011-11-16 DIAGNOSIS — R918 Other nonspecific abnormal finding of lung field: Secondary | ICD-10-CM | POA: Diagnosis not present

## 2011-11-16 DIAGNOSIS — J9620 Acute and chronic respiratory failure, unspecified whether with hypoxia or hypercapnia: Secondary | ICD-10-CM | POA: Diagnosis present

## 2011-11-16 DIAGNOSIS — I252 Old myocardial infarction: Secondary | ICD-10-CM | POA: Diagnosis not present

## 2011-11-16 DIAGNOSIS — Z7902 Long term (current) use of antithrombotics/antiplatelets: Secondary | ICD-10-CM | POA: Diagnosis not present

## 2011-11-16 DIAGNOSIS — I2510 Atherosclerotic heart disease of native coronary artery without angina pectoris: Secondary | ICD-10-CM | POA: Diagnosis not present

## 2011-11-23 DIAGNOSIS — J189 Pneumonia, unspecified organism: Secondary | ICD-10-CM

## 2011-11-24 DIAGNOSIS — R0989 Other specified symptoms and signs involving the circulatory and respiratory systems: Secondary | ICD-10-CM | POA: Diagnosis not present

## 2011-11-24 DIAGNOSIS — R0609 Other forms of dyspnea: Secondary | ICD-10-CM | POA: Diagnosis not present

## 2011-11-25 DIAGNOSIS — J4489 Other specified chronic obstructive pulmonary disease: Secondary | ICD-10-CM | POA: Diagnosis not present

## 2011-11-25 DIAGNOSIS — IMO0001 Reserved for inherently not codable concepts without codable children: Secondary | ICD-10-CM | POA: Diagnosis not present

## 2011-11-25 DIAGNOSIS — R197 Diarrhea, unspecified: Secondary | ICD-10-CM | POA: Diagnosis not present

## 2011-11-25 DIAGNOSIS — I4891 Unspecified atrial fibrillation: Secondary | ICD-10-CM | POA: Diagnosis not present

## 2011-11-25 DIAGNOSIS — M549 Dorsalgia, unspecified: Secondary | ICD-10-CM | POA: Diagnosis not present

## 2011-11-25 DIAGNOSIS — Z0000 Encounter for general adult medical examination without abnormal findings: Secondary | ICD-10-CM | POA: Diagnosis not present

## 2011-11-25 DIAGNOSIS — K219 Gastro-esophageal reflux disease without esophagitis: Secondary | ICD-10-CM | POA: Diagnosis not present

## 2011-11-25 DIAGNOSIS — M810 Age-related osteoporosis without current pathological fracture: Secondary | ICD-10-CM | POA: Diagnosis not present

## 2011-11-25 DIAGNOSIS — J189 Pneumonia, unspecified organism: Secondary | ICD-10-CM | POA: Diagnosis not present

## 2011-11-25 DIAGNOSIS — J90 Pleural effusion, not elsewhere classified: Secondary | ICD-10-CM | POA: Diagnosis not present

## 2011-11-25 DIAGNOSIS — Z79899 Other long term (current) drug therapy: Secondary | ICD-10-CM | POA: Diagnosis not present

## 2011-11-25 DIAGNOSIS — E039 Hypothyroidism, unspecified: Secondary | ICD-10-CM | POA: Diagnosis not present

## 2011-11-25 DIAGNOSIS — G8929 Other chronic pain: Secondary | ICD-10-CM | POA: Diagnosis not present

## 2011-11-25 DIAGNOSIS — J449 Chronic obstructive pulmonary disease, unspecified: Secondary | ICD-10-CM | POA: Diagnosis not present

## 2011-11-25 DIAGNOSIS — Z8701 Personal history of pneumonia (recurrent): Secondary | ICD-10-CM | POA: Diagnosis not present

## 2011-11-25 DIAGNOSIS — G4700 Insomnia, unspecified: Secondary | ICD-10-CM | POA: Diagnosis not present

## 2011-11-25 DIAGNOSIS — M6281 Muscle weakness (generalized): Secondary | ICD-10-CM | POA: Diagnosis not present

## 2011-11-25 DIAGNOSIS — R112 Nausea with vomiting, unspecified: Secondary | ICD-10-CM | POA: Diagnosis not present

## 2011-11-25 DIAGNOSIS — Z7982 Long term (current) use of aspirin: Secondary | ICD-10-CM | POA: Diagnosis not present

## 2011-11-26 DIAGNOSIS — J449 Chronic obstructive pulmonary disease, unspecified: Secondary | ICD-10-CM | POA: Diagnosis not present

## 2011-12-01 DIAGNOSIS — Z9981 Dependence on supplemental oxygen: Secondary | ICD-10-CM | POA: Diagnosis not present

## 2011-12-01 DIAGNOSIS — I5031 Acute diastolic (congestive) heart failure: Secondary | ICD-10-CM | POA: Diagnosis not present

## 2011-12-01 DIAGNOSIS — J449 Chronic obstructive pulmonary disease, unspecified: Secondary | ICD-10-CM | POA: Diagnosis not present

## 2011-12-01 DIAGNOSIS — M545 Low back pain, unspecified: Secondary | ICD-10-CM | POA: Diagnosis not present

## 2011-12-01 DIAGNOSIS — D649 Anemia, unspecified: Secondary | ICD-10-CM | POA: Diagnosis not present

## 2011-12-01 DIAGNOSIS — Z8701 Personal history of pneumonia (recurrent): Secondary | ICD-10-CM | POA: Diagnosis not present

## 2011-12-03 DIAGNOSIS — J449 Chronic obstructive pulmonary disease, unspecified: Secondary | ICD-10-CM | POA: Diagnosis not present

## 2011-12-03 DIAGNOSIS — R0602 Shortness of breath: Secondary | ICD-10-CM | POA: Diagnosis not present

## 2011-12-03 DIAGNOSIS — J159 Unspecified bacterial pneumonia: Secondary | ICD-10-CM | POA: Diagnosis not present

## 2011-12-03 DIAGNOSIS — R197 Diarrhea, unspecified: Secondary | ICD-10-CM | POA: Diagnosis not present

## 2011-12-07 DIAGNOSIS — M1990 Unspecified osteoarthritis, unspecified site: Secondary | ICD-10-CM | POA: Diagnosis not present

## 2011-12-07 DIAGNOSIS — Z9981 Dependence on supplemental oxygen: Secondary | ICD-10-CM | POA: Diagnosis not present

## 2011-12-07 DIAGNOSIS — M545 Low back pain, unspecified: Secondary | ICD-10-CM | POA: Diagnosis not present

## 2011-12-07 DIAGNOSIS — D649 Anemia, unspecified: Secondary | ICD-10-CM | POA: Diagnosis not present

## 2011-12-07 DIAGNOSIS — J449 Chronic obstructive pulmonary disease, unspecified: Secondary | ICD-10-CM | POA: Diagnosis not present

## 2011-12-07 DIAGNOSIS — M549 Dorsalgia, unspecified: Secondary | ICD-10-CM | POA: Diagnosis not present

## 2011-12-07 DIAGNOSIS — J189 Pneumonia, unspecified organism: Secondary | ICD-10-CM | POA: Diagnosis not present

## 2011-12-07 DIAGNOSIS — D599 Acquired hemolytic anemia, unspecified: Secondary | ICD-10-CM | POA: Diagnosis not present

## 2011-12-07 DIAGNOSIS — Z8701 Personal history of pneumonia (recurrent): Secondary | ICD-10-CM | POA: Diagnosis not present

## 2011-12-07 DIAGNOSIS — I5031 Acute diastolic (congestive) heart failure: Secondary | ICD-10-CM | POA: Diagnosis not present

## 2011-12-09 DIAGNOSIS — R197 Diarrhea, unspecified: Secondary | ICD-10-CM | POA: Diagnosis not present

## 2011-12-11 DIAGNOSIS — J449 Chronic obstructive pulmonary disease, unspecified: Secondary | ICD-10-CM | POA: Diagnosis not present

## 2011-12-11 DIAGNOSIS — J984 Other disorders of lung: Secondary | ICD-10-CM | POA: Diagnosis not present

## 2011-12-11 DIAGNOSIS — J9819 Other pulmonary collapse: Secondary | ICD-10-CM | POA: Diagnosis not present

## 2011-12-11 DIAGNOSIS — J189 Pneumonia, unspecified organism: Secondary | ICD-10-CM | POA: Diagnosis not present

## 2011-12-11 DIAGNOSIS — J180 Bronchopneumonia, unspecified organism: Secondary | ICD-10-CM | POA: Diagnosis not present

## 2011-12-14 HISTORY — PX: OTHER SURGICAL HISTORY: SHX169

## 2011-12-25 NOTE — Telephone Encounter (Signed)
Routed to [employer]

## 2012-01-15 DIAGNOSIS — R918 Other nonspecific abnormal finding of lung field: Secondary | ICD-10-CM | POA: Diagnosis not present

## 2012-01-15 DIAGNOSIS — J189 Pneumonia, unspecified organism: Secondary | ICD-10-CM | POA: Diagnosis not present

## 2012-01-25 ENCOUNTER — Encounter: Payer: Medicare Other | Admitting: Internal Medicine

## 2012-01-25 DIAGNOSIS — D599 Acquired hemolytic anemia, unspecified: Secondary | ICD-10-CM | POA: Diagnosis not present

## 2012-01-25 DIAGNOSIS — J189 Pneumonia, unspecified organism: Secondary | ICD-10-CM | POA: Diagnosis not present

## 2012-01-25 DIAGNOSIS — M549 Dorsalgia, unspecified: Secondary | ICD-10-CM | POA: Diagnosis not present

## 2012-01-25 DIAGNOSIS — M1990 Unspecified osteoarthritis, unspecified site: Secondary | ICD-10-CM | POA: Diagnosis not present

## 2012-01-28 DIAGNOSIS — M4712 Other spondylosis with myelopathy, cervical region: Secondary | ICD-10-CM | POA: Diagnosis not present

## 2012-01-28 DIAGNOSIS — M47817 Spondylosis without myelopathy or radiculopathy, lumbosacral region: Secondary | ICD-10-CM | POA: Diagnosis not present

## 2012-02-02 DIAGNOSIS — E042 Nontoxic multinodular goiter: Secondary | ICD-10-CM | POA: Diagnosis not present

## 2012-02-02 DIAGNOSIS — Z79899 Other long term (current) drug therapy: Secondary | ICD-10-CM | POA: Diagnosis not present

## 2012-02-02 DIAGNOSIS — E039 Hypothyroidism, unspecified: Secondary | ICD-10-CM

## 2012-02-02 DIAGNOSIS — E890 Postprocedural hypothyroidism: Secondary | ICD-10-CM | POA: Diagnosis not present

## 2012-02-04 DIAGNOSIS — M4712 Other spondylosis with myelopathy, cervical region: Secondary | ICD-10-CM | POA: Diagnosis not present

## 2012-02-04 DIAGNOSIS — M4802 Spinal stenosis, cervical region: Secondary | ICD-10-CM | POA: Diagnosis not present

## 2012-02-18 DIAGNOSIS — M4712 Other spondylosis with myelopathy, cervical region: Secondary | ICD-10-CM | POA: Diagnosis not present

## 2012-02-18 DIAGNOSIS — M19019 Primary osteoarthritis, unspecified shoulder: Secondary | ICD-10-CM | POA: Diagnosis not present

## 2012-02-18 DIAGNOSIS — M4716 Other spondylosis with myelopathy, lumbar region: Secondary | ICD-10-CM | POA: Diagnosis not present

## 2012-02-22 DIAGNOSIS — H2510 Age-related nuclear cataract, unspecified eye: Secondary | ICD-10-CM | POA: Diagnosis not present

## 2012-02-22 DIAGNOSIS — Z23 Encounter for immunization: Secondary | ICD-10-CM | POA: Diagnosis not present

## 2012-02-22 DIAGNOSIS — H5210 Myopia, unspecified eye: Secondary | ICD-10-CM | POA: Diagnosis not present

## 2012-02-22 DIAGNOSIS — H52229 Regular astigmatism, unspecified eye: Secondary | ICD-10-CM | POA: Diagnosis not present

## 2012-02-22 DIAGNOSIS — H524 Presbyopia: Secondary | ICD-10-CM | POA: Diagnosis not present

## 2012-02-25 DIAGNOSIS — Z1231 Encounter for screening mammogram for malignant neoplasm of breast: Secondary | ICD-10-CM | POA: Diagnosis not present

## 2012-03-07 DIAGNOSIS — M4716 Other spondylosis with myelopathy, lumbar region: Secondary | ICD-10-CM | POA: Diagnosis not present

## 2012-03-07 DIAGNOSIS — M5137 Other intervertebral disc degeneration, lumbosacral region: Secondary | ICD-10-CM | POA: Diagnosis not present

## 2012-03-07 DIAGNOSIS — M47817 Spondylosis without myelopathy or radiculopathy, lumbosacral region: Secondary | ICD-10-CM | POA: Diagnosis not present

## 2012-03-07 DIAGNOSIS — IMO0002 Reserved for concepts with insufficient information to code with codable children: Secondary | ICD-10-CM | POA: Diagnosis not present

## 2012-03-14 DIAGNOSIS — J449 Chronic obstructive pulmonary disease, unspecified: Secondary | ICD-10-CM | POA: Diagnosis not present

## 2012-03-14 DIAGNOSIS — Z780 Asymptomatic menopausal state: Secondary | ICD-10-CM | POA: Diagnosis not present

## 2012-03-14 DIAGNOSIS — Z92241 Personal history of systemic steroid therapy: Secondary | ICD-10-CM | POA: Diagnosis not present

## 2012-03-14 DIAGNOSIS — E039 Hypothyroidism, unspecified: Secondary | ICD-10-CM | POA: Diagnosis not present

## 2012-03-14 DIAGNOSIS — R030 Elevated blood-pressure reading, without diagnosis of hypertension: Secondary | ICD-10-CM | POA: Diagnosis not present

## 2012-03-14 DIAGNOSIS — M810 Age-related osteoporosis without current pathological fracture: Secondary | ICD-10-CM | POA: Diagnosis not present

## 2012-03-14 DIAGNOSIS — M899 Disorder of bone, unspecified: Secondary | ICD-10-CM | POA: Diagnosis not present

## 2012-03-17 ENCOUNTER — Other Ambulatory Visit: Payer: Self-pay | Admitting: *Deleted

## 2012-03-17 DIAGNOSIS — I6529 Occlusion and stenosis of unspecified carotid artery: Secondary | ICD-10-CM

## 2012-03-18 ENCOUNTER — Encounter: Payer: Self-pay | Admitting: Surgery

## 2012-03-21 ENCOUNTER — Ambulatory Visit (INDEPENDENT_AMBULATORY_CARE_PROVIDER_SITE_OTHER): Payer: Medicare Other | Admitting: Surgery

## 2012-03-21 ENCOUNTER — Encounter: Payer: Self-pay | Admitting: Surgery

## 2012-03-21 ENCOUNTER — Ambulatory Visit (INDEPENDENT_AMBULATORY_CARE_PROVIDER_SITE_OTHER): Payer: Medicare Other | Admitting: Vascular Surgery

## 2012-03-21 VITALS — BP 124/46 | HR 66 | Resp 18 | Ht 62.0 in | Wt 117.2 lb

## 2012-03-21 DIAGNOSIS — I6529 Occlusion and stenosis of unspecified carotid artery: Secondary | ICD-10-CM

## 2012-03-21 NOTE — Progress Notes (Signed)
[facility] [place]   Patient name: Mckenzie [name] MRN: [id number] DOB: Apr 05, 1936 Sex: female     Chief Complaint  Patient presents with  . Follow-up    2 year carotid FU  . Carotid    HISTORY OF PRESENT ILLNESS: The patient is back for her carotid occlusive disease. This was originally detected by auscultation of a carotid bruit. At that time duplex revealed 50-69% stenosis in both carotid arteries. This was in 2010. She has been asymptomatic the entire time. This includes today. She did not report any episodes of amaurosis fugax, slurred speech or weakness in either extremity. She does complain of some left leg numbness which is known to me from her degenerative back disease. She also describes some left arm numbness particularly at night. She does have known cervical disc disease. She has recently been taken off of her statin.  Past Medical History  Diagnosis Date  . Left ventricular hypertrophy     EF 65%, echo, April, 2011  . CAD (coronary artery disease)     Stent to LAD, 2003 [facility] / nuclear scan April, 201 to artifact / persistent shortness of breath / catheterization Oct 21, 2010... widely patent stent to the mid LAD, no significant obstructive disease, should be low risk    for back surgery  . Aortic valve sclerosis     Mild, echo, April, 2012  . MR (mitral regurgitation)      Mild echo, 2012 ///  Mild per Echo 2009,  mild, echo, April, 2011  . Carotid arterial disease     Moderate-Dr. Myra Gianotti following  . GERD (gastroesophageal reflux disease)   . Hypothyroidism   . ARDS (adult respiratory distress syndrome) 2003    with tracheostomy-peritonitis  . UTI (urinary tract infection)     Chronic-on Macrodantin  . Hemolytic anemia     Followed by Dr. Derald Macleod  . Diastolic dysfunction     Echo, April, 2011   EF 65%  . Shortness of breath     March, 2012  . Palpitations     Holter, September 16, 2010, PACs and PVCs,one 4 beat run of SVT  .  Back pain     Need for surgery, Dr. Trey Sailors  . Ejection fraction     EF 65%, echo, September 15, 2010  . Preoperative clearance     Preop clearance for back surgery  . COPD (chronic obstructive pulmonary disease)   . Pneumonia 11-2011    Past Surgical History  Procedure Date  . Appendectomy   . Breast lumpectomy   . Tonsillectomy   . Tracheostomy 2003    Following ARDS   . Thyroid surgery 08/2009    Benign adenomas  . Coronary angioplasty with stent placement 2003  . Repair of ruptured lumbar discs 12-2011    History   Social History  . Marital Status: Married    Spouse Name: N/A    Number of Children: N/A  . Years of Education: N/A   Occupational History  . Retired    Social History Main Topics  . Smoking status: Former Smoker -- 1.0 packs/day for 20 years    Types: Cigarettes    Quit date: 06/15/2000  . Smokeless tobacco: Never Used  . Alcohol Use: No  . Drug Use: No  . Sexually Active: Not on file   Other Topics Concern  . Not on file   Social History Narrative   Regular Exercise: Yes-Walk    Family History  Problem Relation Age of Onset  . COPD Other     Allergies as of 03/21/2012 - Review Complete 03/21/2012  Allergen Reaction Noted  . Sulfamethoxazole w-trimethoprim    . Tetracycline      Current Outpatient Prescriptions on File Prior to Visit  Medication Sig Dispense Refill  . Albuterol Sulfate (PROAIR HFA IN) Inhale into the lungs as needed.      Marland Kitchen alendronate (FOSAMAX) 70 MG tablet Take one tablet on every Saturday.      Marland Kitchen aspirin 81 MG tablet Take 1 tablet by mouth daily.      Marland Kitchen atropine-PHENObarbital-scopolamine-hyoscyamine (DONNATAL) 16.2 MG per tablet Take 2 tablets by mouth at bedtime.        . benazepril (LOTENSIN) 5 MG tablet Take 1 tablet by mouth daily.      . Calcium Carbonate-Vit D-Min (CALCIUM 1200 PO) Take one tab daily       . Cholecalciferol (VITAMIN D3) 1000 UNITS CAPS Take one tab daily       . clopidogrel (PLAVIX) 75 MG  tablet Take 1 tablet by mouth daily.       . Cobalamine Combinations (B-6 FOLIC ACID PO) Take by mouth. Take one tab daily       . conjugated estrogens (PREMARIN) vaginal cream Use as directed.       . diltiazem (CARDIZEM SR) 120 MG 12 hr capsule TAKE ONE CAPSULE BY MOUTH EVERY DAY  30 capsule  5  . diphenhydrAMINE (BENADRYL) 25 mg capsule Take 25 mg by mouth at bedtime.        . docusate sodium (COLACE) 100 MG capsule 100 mg. Take 2 tabs twice a day       . esomeprazole (NEXIUM) 40 MG capsule Take 1 capsule by mouth daily.       . Ferrous Sulfate (PX IRON) 27 MG TABS Take by mouth. Take one tab daily       . hyoscyamine (LEVSIN SL) 0.125 MG SL tablet Use one tab sublingual every 6 hours as needed       . levothyroxine (SYNTHROID, LEVOTHROID) 75 MCG tablet once a week. Take 1 tablet by mouth daily.      Marland Kitchen loratadine (CLARITIN) 10 MG tablet Take 1 tablet by mouth daily.      . Melatonin 3-10 MG TABS Take 1 tablet by mouth at bedtime.       . mometasone (NASONEX) 50 MCG/ACT nasal spray Place 2 sprays into the nose as needed.        . Multiple Vitamin (MULTIVITAMIN) tablet Take 1 tablet by mouth daily.        . nitrofurantoin (MACRODANTIN) 100 MG capsule Take 1 tablet by mouth every night.       . nitroGLYCERIN (NITROSTAT) 0.4 MG SL tablet Place one tablet under tongue every 5 minutes up to 3 doses as needed for chest pain. No more than 3 doses over a 15 minute period.       . phenazopyridine (PYRIDIUM) 200 MG tablet Take one tab twice a day       . predniSONE (DELTASONE) 5 MG tablet Take 2.5mg  (1/2 tab) every other day       . raloxifene (EVISTA) 60 MG tablet Take 1 tablet by mouth daily.      . Tamsulosin HCl (FLOMAX) 0.4 MG CAPS Take 1 capsule by mouth daily.       . temazepam (RESTORIL) 30 MG capsule Take 30 mg by mouth at bedtime as needed. Take 1 tablet  by mouth every night.      . tretinoin (RETIN-A) 0.05 % cream Apply topically at bedtime.        Marland Kitchen oxyCODONE-acetaminophen (PERCOCET) 5-325  MG per tablet Take 1-2 tablets by mouth every 6 (six) hours as needed.        . simvastatin (ZOCOR) 80 MG tablet Take 1 tablet by mouth every night.       Marland Kitchen DISCONTD: diltiazem (TIAZAC) 120 MG 24 hr capsule Take 1 capsule (120 mg total) by mouth daily.  30 capsule  6     REVIEW OF SYSTEMS: Please see history of present illness Positive for shortness of breath with exertion All of systems negative  PHYSICAL EXAMINATION:   Vital signs are BP 124/46  Pulse 66  Resp 18  Ht 5\' 2"  (1.575 m)  Wt 117 lb 3.2 oz (53.162 kg)  BMI 21.44 kg/m2 General: The patient appears their stated age. HEENT:  No gross abnormalities Pulmonary:  Non labored breathing Abdomen: Soft and non-tender. Aorta is not palpable Musculoskeletal: There are no major deformities. Neurologic: No focal weakness or paresthesias are detected, Skin: There are no ulcer or rashes noted. Psychiatric: The patient has normal affect. Cardiovascular: There is a regular rate and rhythm without significant murmur appreciated. Left carotid bruit. Palpable left radial and bilateral dorsalis pedis pulse   Diagnostic Studies Duplex ultrasound today reveals 1-39% stenosis in bilateral carotid arteries  Assessment: Asymptomatic carotid stenosis Plan: By ultrasound today, the patient continues to show regression of bilateral plaque within her carotid arteries. This is potentially do to a plaque regression secondary to her statin. She is now currently off her statin. She continues to be asymptomatic. I performed a we will continue with yearly surveillance of her carotid arteries. She will see the nurse practitioner at the next visit  V. Charlena Cross, M.D. [facility] [place] Office: [phone] Pager:  [phone]

## 2012-03-21 NOTE — Progress Notes (Signed)
Carotid duplex performed @ [facility] 03/21/2012

## 2012-03-21 NOTE — Addendum Note (Signed)
Addended by: Melodye Ped C on: 03/21/2012 02:11 PM   Modules accepted: Orders

## 2012-03-31 DIAGNOSIS — H269 Unspecified cataract: Secondary | ICD-10-CM | POA: Diagnosis not present

## 2012-03-31 DIAGNOSIS — H35379 Puckering of macula, unspecified eye: Secondary | ICD-10-CM | POA: Diagnosis not present

## 2012-04-01 DIAGNOSIS — J441 Chronic obstructive pulmonary disease with (acute) exacerbation: Secondary | ICD-10-CM | POA: Diagnosis not present

## 2012-04-14 DIAGNOSIS — M47817 Spondylosis without myelopathy or radiculopathy, lumbosacral region: Secondary | ICD-10-CM | POA: Diagnosis not present

## 2012-04-18 ENCOUNTER — Other Ambulatory Visit: Payer: Self-pay | Admitting: Cardiology

## 2012-04-27 DIAGNOSIS — M5137 Other intervertebral disc degeneration, lumbosacral region: Secondary | ICD-10-CM | POA: Diagnosis not present

## 2012-04-27 DIAGNOSIS — IMO0002 Reserved for concepts with insufficient information to code with codable children: Secondary | ICD-10-CM | POA: Diagnosis not present

## 2012-04-27 DIAGNOSIS — M47817 Spondylosis without myelopathy or radiculopathy, lumbosacral region: Secondary | ICD-10-CM | POA: Diagnosis not present

## 2012-04-27 DIAGNOSIS — M4716 Other spondylosis with myelopathy, lumbar region: Secondary | ICD-10-CM | POA: Diagnosis not present

## 2012-05-16 ENCOUNTER — Telehealth: Payer: Self-pay | Admitting: Cardiology

## 2012-05-16 NOTE — Telephone Encounter (Signed)
Patient called office and scheduled one year follow up

## 2012-05-23 DIAGNOSIS — M4716 Other spondylosis with myelopathy, lumbar region: Secondary | ICD-10-CM | POA: Diagnosis not present

## 2012-05-23 DIAGNOSIS — M47817 Spondylosis without myelopathy or radiculopathy, lumbosacral region: Secondary | ICD-10-CM | POA: Diagnosis not present

## 2012-05-23 DIAGNOSIS — M5137 Other intervertebral disc degeneration, lumbosacral region: Secondary | ICD-10-CM | POA: Diagnosis not present

## 2012-05-23 DIAGNOSIS — IMO0002 Reserved for concepts with insufficient information to code with codable children: Secondary | ICD-10-CM | POA: Diagnosis not present

## 2012-05-24 DIAGNOSIS — J449 Chronic obstructive pulmonary disease, unspecified: Secondary | ICD-10-CM | POA: Diagnosis not present

## 2012-05-24 DIAGNOSIS — R300 Dysuria: Secondary | ICD-10-CM | POA: Diagnosis not present

## 2012-06-06 DIAGNOSIS — R059 Cough, unspecified: Secondary | ICD-10-CM | POA: Diagnosis not present

## 2012-06-06 DIAGNOSIS — J449 Chronic obstructive pulmonary disease, unspecified: Secondary | ICD-10-CM | POA: Diagnosis not present

## 2012-06-06 DIAGNOSIS — R05 Cough: Secondary | ICD-10-CM | POA: Diagnosis not present

## 2012-07-08 ENCOUNTER — Ambulatory Visit (INDEPENDENT_AMBULATORY_CARE_PROVIDER_SITE_OTHER): Payer: Medicare Other | Admitting: Urology

## 2012-07-08 DIAGNOSIS — N3946 Mixed incontinence: Secondary | ICD-10-CM | POA: Diagnosis not present

## 2012-07-08 DIAGNOSIS — N3020 Other chronic cystitis without hematuria: Secondary | ICD-10-CM

## 2012-07-08 DIAGNOSIS — R339 Retention of urine, unspecified: Secondary | ICD-10-CM

## 2012-07-08 DIAGNOSIS — R3911 Hesitancy of micturition: Secondary | ICD-10-CM | POA: Diagnosis not present

## 2012-07-14 DIAGNOSIS — M4716 Other spondylosis with myelopathy, lumbar region: Secondary | ICD-10-CM | POA: Diagnosis not present

## 2012-07-14 DIAGNOSIS — M47817 Spondylosis without myelopathy or radiculopathy, lumbosacral region: Secondary | ICD-10-CM | POA: Diagnosis not present

## 2012-07-14 DIAGNOSIS — IMO0002 Reserved for concepts with insufficient information to code with codable children: Secondary | ICD-10-CM | POA: Diagnosis not present

## 2012-07-14 DIAGNOSIS — M810 Age-related osteoporosis without current pathological fracture: Secondary | ICD-10-CM | POA: Diagnosis not present

## 2012-07-18 ENCOUNTER — Encounter: Payer: Self-pay | Admitting: Cardiology

## 2012-07-20 DIAGNOSIS — D649 Anemia, unspecified: Secondary | ICD-10-CM | POA: Diagnosis not present

## 2012-07-20 DIAGNOSIS — M810 Age-related osteoporosis without current pathological fracture: Secondary | ICD-10-CM | POA: Diagnosis not present

## 2012-07-20 DIAGNOSIS — Z7983 Long term (current) use of bisphosphonates: Secondary | ICD-10-CM | POA: Diagnosis not present

## 2012-07-22 ENCOUNTER — Ambulatory Visit: Payer: Medicare Other | Admitting: Cardiology

## 2012-08-17 DIAGNOSIS — Z79899 Other long term (current) drug therapy: Secondary | ICD-10-CM | POA: Diagnosis not present

## 2012-08-17 DIAGNOSIS — M4716 Other spondylosis with myelopathy, lumbar region: Secondary | ICD-10-CM | POA: Diagnosis not present

## 2012-08-17 DIAGNOSIS — IMO0002 Reserved for concepts with insufficient information to code with codable children: Secondary | ICD-10-CM | POA: Diagnosis not present

## 2012-08-17 DIAGNOSIS — M5137 Other intervertebral disc degeneration, lumbosacral region: Secondary | ICD-10-CM | POA: Diagnosis not present

## 2012-08-17 DIAGNOSIS — M47817 Spondylosis without myelopathy or radiculopathy, lumbosacral region: Secondary | ICD-10-CM | POA: Diagnosis not present

## 2012-08-19 ENCOUNTER — Encounter: Payer: Self-pay | Admitting: Cardiology

## 2012-08-22 ENCOUNTER — Ambulatory Visit (INDEPENDENT_AMBULATORY_CARE_PROVIDER_SITE_OTHER): Payer: Medicare Other | Admitting: Cardiology

## 2012-08-22 ENCOUNTER — Encounter: Payer: Self-pay | Admitting: Cardiology

## 2012-08-22 VITALS — BP 110/50 | HR 68 | Ht 62.0 in | Wt 117.0 lb

## 2012-08-22 DIAGNOSIS — I2510 Atherosclerotic heart disease of native coronary artery without angina pectoris: Secondary | ICD-10-CM | POA: Diagnosis not present

## 2012-08-22 DIAGNOSIS — I358 Other nonrheumatic aortic valve disorders: Secondary | ICD-10-CM

## 2012-08-22 DIAGNOSIS — M549 Dorsalgia, unspecified: Secondary | ICD-10-CM

## 2012-08-22 DIAGNOSIS — I359 Nonrheumatic aortic valve disorder, unspecified: Secondary | ICD-10-CM

## 2012-08-22 DIAGNOSIS — R002 Palpitations: Secondary | ICD-10-CM

## 2012-08-22 DIAGNOSIS — I779 Disorder of arteries and arterioles, unspecified: Secondary | ICD-10-CM

## 2012-08-22 DIAGNOSIS — I059 Rheumatic mitral valve disease, unspecified: Secondary | ICD-10-CM

## 2012-08-22 DIAGNOSIS — E785 Hyperlipidemia, unspecified: Secondary | ICD-10-CM

## 2012-08-22 DIAGNOSIS — I340 Nonrheumatic mitral (valve) insufficiency: Secondary | ICD-10-CM

## 2012-08-22 DIAGNOSIS — E039 Hypothyroidism, unspecified: Secondary | ICD-10-CM

## 2012-08-22 NOTE — Assessment & Plan Note (Signed)
Her thyroid status is followed by her endocrinologist.

## 2012-08-22 NOTE — Assessment & Plan Note (Signed)
Her carotid disease is followed carefully by vascular surgery. This is stable.

## 2012-08-22 NOTE — Assessment & Plan Note (Signed)
>>  ASSESSMENT AND PLAN FOR HYPOTHYROIDISM WRITTEN ON 08/22/2012  2:58 PM BY KATZ, JEFFREY D, MD  Her thyroid status is followed by her endocrinologist.

## 2012-08-22 NOTE — Assessment & Plan Note (Signed)
The patient is on a statin. She is on 10 mg of Lipitor. With the current new guidelines consideration should be given to using a higher dose of Lipitor. Her lipids are followed by her primary team.

## 2012-08-22 NOTE — Patient Instructions (Signed)
Continue all current medications. Your physician wants you to follow up in:  1 year.  You will receive a reminder letter in the mail one-two months in advance.  If you don't receive a letter, please call our office to schedule the follow up appointment   

## 2012-08-22 NOTE — Assessment & Plan Note (Signed)
We know that the patient has had some PACs and PVCs in the past. In addition she had a 4 beat run of supraventricular tachycardia. She is on a dose of 120 mg of long-acting diltiazem. She is not on a beta blocker. She does have reactive airway disease. Currently she does notice a pounding sensation at times after walking. However this does not persist. She is not limited by this. I feel it is most prudent not to change her medicines.  The patient tells me that her insurance company has sent her some information suggesting that she may not be able to receive long-acting diltiazem in her lowest tier. I'm hopeful that this will not be the case as it would be inappropriate. It is not appropriate for this patient have to take diltiazem 4 times daily.

## 2012-08-22 NOTE — Assessment & Plan Note (Signed)
Mitral regurgitation is mild by echo. She does not need a followup echo at this time.

## 2012-08-22 NOTE — Assessment & Plan Note (Signed)
The patient has continued assessment by Dr. Channing Mutters. At this time there is no definite plan for surgery. She is cleared from the cardiac viewpoint if surgery is needed.

## 2012-08-22 NOTE — Assessment & Plan Note (Signed)
We know that the patient's LAD stent is widely patent in May, 2012. She's not having any significant symptoms. She is receiving appropriate medications. No change in therapy.

## 2012-08-22 NOTE — Assessment & Plan Note (Signed)
The patient has a soft systolic murmur. There is aortic valve sclerosis. The last echo was 2011. She does not need a followup echo at this time.

## 2012-08-22 NOTE — Progress Notes (Signed)
Patient ID: Mckenzie [name], female   DOB: 1935-10-15, 77 y.o.   MRN: [id number]   HPI  The patient is seen to followup coronary disease and palpitations. I saw her last November, 2012.  She's doing well. She's not having any chest pain. She underwent catheterization in May, 2012. It showed that the stent in her mid LAD was widely patent. Her ejection fraction is 65%. She's not having any chest pain. She does notice that sometimes after walking she will sit down and feel a pounding sensation in her chest. This is short lived. She is not sure that it actually represents increased heart rate. It does not limit her.  As part of today's evaluation I completely reviewed the electronic medical record. The patient has seen Dr. Myra Gianotti for the followup of her carotid disease. Her most recent study showed that there may have even been some regression of her carotid disease.  Allergies  Allergen Reactions  . Sulfamethoxazole W-Trimethoprim   . Tetracycline     REACTION: nausea    Current Outpatient Prescriptions  Medication Sig Dispense Refill  . Albuterol Sulfate (PROAIR HFA IN) Inhale into the lungs as needed.      Marland Kitchen alendronate (FOSAMAX) 70 MG tablet Take one tablet on every Saturday.      Marland Kitchen aspirin 81 MG tablet Take 1 tablet by mouth daily.      Marland Kitchen atorvastatin (LIPITOR) 10 MG tablet Take 10 mg by mouth daily.       . benazepril (LOTENSIN) 5 MG tablet Take 1 tablet by mouth daily.      . Calcium Carbonate-Vit D-Min (CALCIUM 1200 PO) Take one tab daily       . Cholecalciferol (VITAMIN D3) 1000 UNITS CAPS Take one tab daily       . clopidogrel (PLAVIX) 75 MG tablet Take 1 tablet by mouth daily.       . Cobalamine Combinations (B-6 FOLIC ACID PO) Take by mouth. Take one tab daily       . conjugated estrogens (PREMARIN) vaginal cream Use as directed.       . diltiazem (CARDIZEM CD) 120 MG 24 hr capsule TAKE ONE CAPSULE BY MOUTH EVERY DAY  30 capsule  4  . diphenhydrAMINE (BENADRYL) 25 mg  capsule Take 25 mg by mouth at bedtime.        . docusate sodium (COLACE) 100 MG capsule 100 mg. Take 2 tabs twice a day       . esomeprazole (NEXIUM) 40 MG capsule Take 1 capsule by mouth daily.       Marland Kitchen estrogens, conjugated, (PREMARIN) 0.625 MG tablet Take 0.625 mg by mouth every other day. Take daily for 21 days then do not take for 7 days.      . Ferrous Sulfate (PX IRON) 27 MG TABS Take by mouth. Take one tab daily       . hyoscyamine (LEVSIN SL) 0.125 MG SL tablet Use one tab sublingual every 6 hours as needed       . levothyroxine (SYNTHROID, LEVOTHROID) 75 MCG tablet once a week. Take 1 tablet by mouth daily.      Marland Kitchen loratadine (CLARITIN) 10 MG tablet Take 1 tablet by mouth daily.      . Melatonin 3-10 MG TABS Take 1 tablet by mouth at bedtime.       . mometasone (NASONEX) 50 MCG/ACT nasal spray Place 2 sprays into the nose as needed.        Marland Kitchen morphine (MS  CONTIN) 30 MG 12 hr tablet Take 30 mg by mouth 2 (two) times daily as needed.       Marland Kitchen morphine (MSIR) 15 MG tablet Take 15 mg by mouth every 4 (four) hours as needed for pain.      . Multiple Vitamin (MULTIVITAMIN) tablet Take 1 tablet by mouth daily.        . nitrofurantoin (MACRODANTIN) 100 MG capsule Take 1 tablet by mouth every night.       . nitroGLYCERIN (NITROSTAT) 0.4 MG SL tablet Place one tablet under tongue every 5 minutes up to 3 doses as needed for chest pain. No more than 3 doses over a 15 minute period.       . phenazopyridine (PYRIDIUM) 200 MG tablet Take one tab twice a day       . polyethylene glycol powder (GLYCOLAX/MIRALAX) powder Take 17 g by mouth daily.      . predniSONE (DELTASONE) 5 MG tablet Take 2.5mg  (1/2 tab) every other day       . raloxifene (EVISTA) 60 MG tablet Take 1 tablet by mouth daily.      . Tamsulosin HCl (FLOMAX) 0.4 MG CAPS Take 1 capsule by mouth daily.       . temazepam (RESTORIL) 30 MG capsule Take 30 mg by mouth at bedtime as needed. Take 1 tablet by mouth every night.      . tretinoin  (RETIN-A) 0.05 % cream Apply topically at bedtime.        . [DISCONTINUED] diltiazem (CARDIZEM SR) 120 MG 12 hr capsule TAKE ONE CAPSULE BY MOUTH EVERY DAY  30 capsule  5  . [DISCONTINUED] diltiazem (TIAZAC) 120 MG 24 hr capsule Take 1 capsule (120 mg total) by mouth daily.  30 capsule  6   No current facility-administered medications for this visit.    History   Social History  . Marital Status: Married    Spouse Name: N/A    Number of Children: N/A  . Years of Education: N/A   Occupational History  . Retired    Social History Main Topics  . Smoking status: Former Smoker -- 1.00 packs/day for 20 years    Types: Cigarettes    Quit date: 06/15/2000  . Smokeless tobacco: Never Used  . Alcohol Use: No  . Drug Use: No  . Sexually Active: Not on file   Other Topics Concern  . Not on file   Social History Narrative   Regular Exercise: Yes-Walk    Family History  Problem Relation Age of Onset  . COPD Other     Past Medical History  Diagnosis Date  . Left ventricular hypertrophy     EF 65%, echo, April, 2011  . CAD (coronary artery disease)     Stent to LAD, 2003 [facility] / nuclear scan April, 201 to artifact / persistent shortness of breath / catheterization Oct 21, 2010... widely patent stent to the mid LAD, no significant obstructive disease, should be low risk    for back surgery  . Aortic valve sclerosis     Mild, echo, April, 2012  . MR (mitral regurgitation)      Mild echo, 2012 ///  Mild per Echo 2009,  mild, echo, April, 2011  . Carotid arterial disease     Moderate-Dr. Myra Gianotti following  . GERD (gastroesophageal reflux disease)   . Hypothyroidism   . ARDS (adult respiratory distress syndrome) 2003    with tracheostomy-peritonitis  . UTI (urinary tract infection)  Chronic-on Macrodantin  . Hemolytic anemia     Followed by Dr. Derald Macleod  . Diastolic dysfunction     Echo, April, 2011   EF 65%  . Shortness of breath     March, 2012  . Palpitations      Holter, September 16, 2010, PACs and PVCs,one 4 beat run of SVT  . Back pain     Need for surgery, Dr. Trey Sailors  . Ejection fraction     EF 65%, echo, September 15, 2010  . Preoperative clearance     Preop clearance for back surgery  . COPD (chronic obstructive pulmonary disease)   . Pneumonia 11-2011    Past Surgical History  Procedure Laterality Date  . Appendectomy    . Breast lumpectomy    . Tonsillectomy    . Tracheostomy  2003    Following ARDS   . Thyroid surgery  08/2009    Benign adenomas  . Coronary angioplasty with stent placement  2003  . Repair of ruptured lumbar discs  12-2011    Patient Active Problem List  Diagnosis  . DYSLIPIDEMIA  . GERD  . Left ventricular hypertrophy  . Aortic valve sclerosis  . Carotid arterial disease  . GERD (gastroesophageal reflux disease)  . Hypothyroidism  . ARDS (adult respiratory distress syndrome)  . Hemolytic anemia  . Diastolic dysfunction  . Shortness of breath  . Back pain  . Palpitations  . Ejection fraction  . MR (mitral regurgitation)  . CAD (coronary artery disease)  . Preoperative clearance  . Occlusion and stenosis of carotid artery without mention of cerebral infarction    ROS   Patient denies fever, chills, headache, sweats, rash, change in vision, change in hearing, chest pain, cough, nausea vomiting, urinary symptoms. All of the systems are reviewed and are negative.  PHYSICAL EXAM  Patient's oriented to person time and place. Affect is normal.  There is no jugulovenous distention. Lungs are clear. Respiratory effort is nonlabored. Cardiac exam reveals S1 and S2. There is a 2/6 crescendo decrescendo systolic murmur. The abdomen is soft. There is no peripheral edema.There no musculoskeletal deformities. There are no skin rashes.  Filed Vitals:   08/22/12 1426  BP: 110/50  Pulse: 68  Height: 5\' 2"  (1.575 m)  Weight: 117 lb (53.071 kg)   EKG is done today and reviewed by me. There is sinus rhythm. There is no  significant change and no significant abnormality.  ASSESSMENT & PLAN

## 2012-09-15 ENCOUNTER — Other Ambulatory Visit: Payer: Self-pay | Admitting: *Deleted

## 2012-09-15 MED ORDER — DILTIAZEM HCL ER COATED BEADS 120 MG PO CP24: ORAL_CAPSULE | ORAL | Status: DC

## 2012-09-28 DIAGNOSIS — H35379 Puckering of macula, unspecified eye: Secondary | ICD-10-CM | POA: Diagnosis not present

## 2012-09-28 DIAGNOSIS — H2510 Age-related nuclear cataract, unspecified eye: Secondary | ICD-10-CM | POA: Diagnosis not present

## 2012-10-07 DIAGNOSIS — J449 Chronic obstructive pulmonary disease, unspecified: Secondary | ICD-10-CM | POA: Diagnosis not present

## 2012-10-19 DIAGNOSIS — IMO0002 Reserved for concepts with insufficient information to code with codable children: Secondary | ICD-10-CM | POA: Diagnosis not present

## 2012-10-19 DIAGNOSIS — M47817 Spondylosis without myelopathy or radiculopathy, lumbosacral region: Secondary | ICD-10-CM | POA: Diagnosis not present

## 2012-10-26 DIAGNOSIS — M4716 Other spondylosis with myelopathy, lumbar region: Secondary | ICD-10-CM | POA: Diagnosis not present

## 2012-10-26 DIAGNOSIS — IMO0002 Reserved for concepts with insufficient information to code with codable children: Secondary | ICD-10-CM | POA: Diagnosis not present

## 2012-10-26 DIAGNOSIS — M5137 Other intervertebral disc degeneration, lumbosacral region: Secondary | ICD-10-CM | POA: Diagnosis not present

## 2012-10-26 DIAGNOSIS — M47817 Spondylosis without myelopathy or radiculopathy, lumbosacral region: Secondary | ICD-10-CM | POA: Diagnosis not present

## 2012-10-31 DIAGNOSIS — H18519 Endothelial corneal dystrophy, unspecified eye: Secondary | ICD-10-CM | POA: Diagnosis not present

## 2012-10-31 DIAGNOSIS — H40019 Open angle with borderline findings, low risk, unspecified eye: Secondary | ICD-10-CM | POA: Diagnosis not present

## 2012-10-31 DIAGNOSIS — H2510 Age-related nuclear cataract, unspecified eye: Secondary | ICD-10-CM | POA: Diagnosis not present

## 2012-11-21 ENCOUNTER — Encounter (HOSPITAL_COMMUNITY): Payer: Self-pay | Admitting: Pharmacy Technician

## 2012-11-24 ENCOUNTER — Encounter (HOSPITAL_COMMUNITY): Payer: Self-pay | Admitting: Pharmacy Technician

## 2012-11-28 NOTE — Patient Instructions (Signed)
Your procedure is scheduled on:  12/05/12  Report to [facility] at  7:00 AM.  Call this number if you have problems the morning of surgery: [phone]   Remember:   Do not eat or drink:After Midnight.  Take these medicines the morning of surgery with A SIP OF WATER: Benazepril, Cardizem, Nexium, Synthroid, Prednisone and Claritin. You make take your Morphine if needed. Also, be sure to use you Albuterol inhaler.   Do not wear jewelry, make-up or nail polish.  Do not wear lotions, powders, or perfumes. You may wear deodorant.  Do not shave 48 hours prior to surgery. Men may shave face and neck.  Do not bring valuables to the hospital.  Contacts, dentures or bridgework may not be worn into surgery.  Leave suitcase in the car. After surgery it may be brought to your room.  For patients admitted to the hospital, checkout time is 11:00 AM the day of discharge.   Patients discharged the day of surgery will not be allowed to drive home.    Special Instructions: Start using your eye drops before surgery as directed by your eye doctor.   Please read over the following fact sheets that you were given: Anesthesia Post-op Instructions    Cataract Surgery  A cataract is a clouding of the lens of the eye. When a lens becomes cloudy, vision is reduced based on the degree and nature of the clouding. Surgery may be needed to improve vision. Surgery removes the cloudy lens and usually replaces it with a substitute lens (intraocular lens, IOL). LET YOUR EYE DOCTOR KNOW ABOUT:  Allergies to food or medicine.  Medicines taken including herbs, eyedrops, over-the-counter medicines, and creams.  Use of steroids (by mouth or creams).  Previous problems with anesthetics or numbing medicine.  History of bleeding problems or blood clots.  Previous surgery.  Other health problems, including diabetes and kidney problems.  Possibility of pregnancy, if this applies. RISKS AND  COMPLICATIONS  Infection.  Inflammation of the eyeball (endophthalmitis) that can spread to both eyes (sympathetic ophthalmia).  Poor wound healing.  If an IOL is inserted, it can later fall out of proper position. This is very uncommon.  Clouding of the part of your eye that holds an IOL in place. This is called an "after-cataract." These are uncommon, but easily treated. BEFORE THE PROCEDURE  Do not eat or drink anything except small amounts of water for 8 to 12 before your surgery, or as directed by your caregiver.  Unless you are told otherwise, continue any eyedrops you have been prescribed.  Talk to your primary caregiver about all other medicines that you take (both prescription and non-prescription). In some cases, you may need to stop or change medicines near the time of your surgery. This is most important if you are taking blood-thinning medicine.Do not stop medicines unless you are told to do so.  Arrange for someone to drive you to and from the procedure.  Do not put contact lenses in either eye on the day of your surgery. PROCEDURE There is more than one method for safely removing a cataract. Your doctor can explain the differences and help determine which is best for you. Phacoemulsification surgery is the most common form of cataract surgery.  An injection is given behind the eye or eyedrops are given to make this a painless procedure.  A small cut (incision) is made on the edge of the clear, dome-shaped surface that covers the front of the eye (cornea).  A tiny probe is painlessly inserted into the eye. This device gives off ultrasound waves that soften and break up the cloudy center of the lens. This makes it easier for the cloudy lens to be removed by suction.  An IOL may be implanted.  The normal lens of the eye is covered by a clear capsule. Part of that capsule is intentionally left in the eye to support the IOL.  Your surgeon may or may not use stitches to  close the incision. There are other forms of cataract surgery that require a larger incision and stiches to close the eye. This approach is taken in cases where the doctor feels that the cataract cannot be easily removed using phacoemulsification. AFTER THE PROCEDURE  When an IOL is implanted, it does not need care. It becomes a permanent part of your eye and cannot be seen or felt.  Your doctor will schedule follow-up exams to check on your progress.  Review your other medicines with your doctor to see which can be resumed after surgery.  Use eyedrops or take medicine as prescribed by your doctor. Document Released: 05/21/2011 Document Revised: 08/24/2011 Document Reviewed: 05/21/2011 [facility] Patient Information 2013 [place], [place].    PATIENT INSTRUCTIONS POST-ANESTHESIA  IMMEDIATELY FOLLOWING SURGERY:  Do not drive or operate machinery for the first twenty four hours after surgery.  Do not make any important decisions for twenty four hours after surgery or while taking narcotic pain medications or sedatives.  If you develop intractable nausea and vomiting or a severe headache please notify your doctor immediately.  FOLLOW-UP:  Please make an appointment with your surgeon as instructed. You do not need to follow up with anesthesia unless specifically instructed to do so.  WOUND CARE INSTRUCTIONS (if applicable):  Keep a dry clean dressing on the anesthesia/puncture wound site if there is drainage.  Once the wound has quit draining you may leave it open to air.  Generally you should leave the bandage intact for twenty four hours unless there is drainage.  If the epidural site drains for more than 36-48 hours please call the anesthesia department.  QUESTIONS?:  Please feel free to call your physician or the hospital operator if you have any questions, and they will be happy to assist you.

## 2012-11-29 ENCOUNTER — Encounter (HOSPITAL_COMMUNITY)
Admission: RE | Admit: 2012-11-29 | Discharge: 2012-11-29 | Disposition: A | Discharge status: Home / Self Care | Payer: Medicare Other | Source: Ambulatory Visit | Attending: Ophthalmology | Admitting: Ophthalmology

## 2012-11-29 ENCOUNTER — Encounter (HOSPITAL_COMMUNITY): Payer: Self-pay

## 2012-11-29 ENCOUNTER — Other Ambulatory Visit: Payer: Self-pay

## 2012-11-29 DIAGNOSIS — H2181 Floppy iris syndrome: Secondary | ICD-10-CM | POA: Diagnosis not present

## 2012-11-29 DIAGNOSIS — I10 Essential (primary) hypertension: Secondary | ICD-10-CM | POA: Diagnosis not present

## 2012-11-29 DIAGNOSIS — J449 Chronic obstructive pulmonary disease, unspecified: Secondary | ICD-10-CM | POA: Diagnosis not present

## 2012-11-29 DIAGNOSIS — Z01812 Encounter for preprocedural laboratory examination: Secondary | ICD-10-CM | POA: Diagnosis not present

## 2012-11-29 DIAGNOSIS — Z79899 Other long term (current) drug therapy: Secondary | ICD-10-CM | POA: Diagnosis not present

## 2012-11-29 DIAGNOSIS — H2510 Age-related nuclear cataract, unspecified eye: Secondary | ICD-10-CM | POA: Diagnosis not present

## 2012-11-29 DIAGNOSIS — Z01810 Encounter for preprocedural cardiovascular examination: Secondary | ICD-10-CM | POA: Diagnosis not present

## 2012-11-29 HISTORY — DX: Dorsalgia, unspecified: M54.9

## 2012-11-29 HISTORY — DX: Essential (primary) hypertension: I10

## 2012-11-29 HISTORY — DX: Acute myocardial infarction, unspecified: I21.9

## 2012-11-29 HISTORY — DX: Other chronic pain: G89.29

## 2012-11-29 HISTORY — DX: Unspecified osteoarthritis, unspecified site: M19.90

## 2012-11-29 HISTORY — DX: Cardiac arrhythmia, unspecified: I49.9

## 2012-11-29 LAB — BASIC METABOLIC PANEL
BUN: 19 mg/dL (ref 6–23)
CO2: 34 mEq/L — ABNORMAL HIGH (ref 19–32)
Calcium: 9.7 mg/dL (ref 8.4–10.5)
Chloride: 101 mEq/L (ref 96–112)
Creatinine, Ser: 0.73 mg/dL (ref 0.50–1.10)
GFR calc Af Amer: >90 mL/min (ref >90)
GFR calc non Af Amer: 82 mL/min — ABNORMAL LOW (ref >90)
Glucose, Bld: 92 mg/dL (ref 70–99)
Potassium: 3.9 mEq/L (ref 3.5–5.1)
Sodium: 142 mEq/L (ref 135–145)

## 2012-11-29 LAB — HEMOGLOBIN AND HEMATOCRIT, BLOOD
HCT: 37.6 % (ref 36.0–46.0)
Hemoglobin: 12.4 g/dL (ref 12.0–15.0)

## 2012-12-01 DIAGNOSIS — I359 Nonrheumatic aortic valve disorder, unspecified: Secondary | ICD-10-CM | POA: Diagnosis not present

## 2012-12-01 DIAGNOSIS — R5383 Other fatigue: Secondary | ICD-10-CM | POA: Diagnosis not present

## 2012-12-01 DIAGNOSIS — E782 Mixed hyperlipidemia: Secondary | ICD-10-CM | POA: Diagnosis not present

## 2012-12-01 DIAGNOSIS — M810 Age-related osteoporosis without current pathological fracture: Secondary | ICD-10-CM | POA: Diagnosis not present

## 2012-12-01 DIAGNOSIS — M545 Low back pain, unspecified: Secondary | ICD-10-CM | POA: Diagnosis not present

## 2012-12-01 DIAGNOSIS — G4700 Insomnia, unspecified: Secondary | ICD-10-CM | POA: Diagnosis not present

## 2012-12-01 DIAGNOSIS — J449 Chronic obstructive pulmonary disease, unspecified: Secondary | ICD-10-CM | POA: Diagnosis not present

## 2012-12-01 DIAGNOSIS — R5381 Other malaise: Secondary | ICD-10-CM | POA: Diagnosis not present

## 2012-12-01 DIAGNOSIS — I10 Essential (primary) hypertension: Secondary | ICD-10-CM | POA: Diagnosis not present

## 2012-12-02 MED ORDER — NEOMYCIN-POLYMYXIN-DEXAMETH 3.5-10000-0.1 OP OINT
TOPICAL_OINTMENT | OPHTHALMIC | Status: AC
  Filled 2012-12-02: qty 3.5

## 2012-12-02 MED ORDER — CYCLOPENTOLATE-PHENYLEPHRINE 0.2-1 % OP SOLN
OPHTHALMIC | Status: AC
  Filled 2012-12-02: qty 2

## 2012-12-02 MED ORDER — LIDOCAINE HCL 3.5 % OP GEL
OPHTHALMIC | Status: AC
  Filled 2012-12-02: qty 5

## 2012-12-02 MED ORDER — LIDOCAINE HCL (PF) 1 % IJ SOLN
INTRAMUSCULAR | Status: AC
  Filled 2012-12-02: qty 2

## 2012-12-02 MED ORDER — TETRACAINE HCL 0.5 % OP SOLN
OPHTHALMIC | Status: AC
  Filled 2012-12-02: qty 2

## 2012-12-05 ENCOUNTER — Encounter (HOSPITAL_COMMUNITY): Payer: Self-pay | Admitting: *Deleted

## 2012-12-05 ENCOUNTER — Ambulatory Visit (HOSPITAL_COMMUNITY)
Admission: RE | Admit: 2012-12-05 | Discharge: 2012-12-05 | Disposition: A | Discharge status: Home / Self Care | Payer: Medicare Other | Source: Ambulatory Visit | Attending: Ophthalmology | Admitting: Ophthalmology

## 2012-12-05 ENCOUNTER — Ambulatory Visit (HOSPITAL_COMMUNITY): Payer: Medicare Other | Admitting: Anesthesiology

## 2012-12-05 ENCOUNTER — Encounter (HOSPITAL_COMMUNITY)
Admission: RE | Disposition: A | Discharge status: Home / Self Care | Payer: Self-pay | Source: Ambulatory Visit | Attending: Ophthalmology

## 2012-12-05 ENCOUNTER — Encounter (HOSPITAL_COMMUNITY): Payer: Self-pay | Admitting: Anesthesiology

## 2012-12-05 DIAGNOSIS — Z79899 Other long term (current) drug therapy: Secondary | ICD-10-CM | POA: Insufficient documentation

## 2012-12-05 DIAGNOSIS — Z01810 Encounter for preprocedural cardiovascular examination: Secondary | ICD-10-CM | POA: Insufficient documentation

## 2012-12-05 DIAGNOSIS — I10 Essential (primary) hypertension: Secondary | ICD-10-CM | POA: Diagnosis not present

## 2012-12-05 DIAGNOSIS — Z01812 Encounter for preprocedural laboratory examination: Secondary | ICD-10-CM | POA: Diagnosis not present

## 2012-12-05 DIAGNOSIS — H2510 Age-related nuclear cataract, unspecified eye: Secondary | ICD-10-CM | POA: Diagnosis not present

## 2012-12-05 DIAGNOSIS — H2181 Floppy iris syndrome: Secondary | ICD-10-CM | POA: Diagnosis not present

## 2012-12-05 DIAGNOSIS — J449 Chronic obstructive pulmonary disease, unspecified: Secondary | ICD-10-CM | POA: Insufficient documentation

## 2012-12-05 DIAGNOSIS — H269 Unspecified cataract: Secondary | ICD-10-CM | POA: Diagnosis not present

## 2012-12-05 DIAGNOSIS — J4489 Other specified chronic obstructive pulmonary disease: Secondary | ICD-10-CM | POA: Insufficient documentation

## 2012-12-05 HISTORY — PX: CATARACT EXTRACTION W/PHACO: SHX586

## 2012-12-05 SURGERY — PHACOEMULSIFICATION, CATARACT, WITH IOL INSERTION
Anesthesia: Monitor Anesthesia Care | Site: Eye | Laterality: Right | Wound class: Clean

## 2012-12-05 MED ORDER — MIDAZOLAM HCL 2 MG/2ML IJ SOLN
1.0000 mg | INTRAMUSCULAR | Status: DC | PRN
  Administered 2012-12-05: 2 mg via INTRAVENOUS

## 2012-12-05 MED ORDER — EPINEPHRINE HCL 1 MG/ML IJ SOLN
INTRAOCULAR | Status: DC | PRN
  Administered 2012-12-05: 08:00:00

## 2012-12-05 MED ORDER — BSS IO SOLN
INTRAOCULAR | Status: DC | PRN
  Administered 2012-12-05: 15 mL via INTRAOCULAR

## 2012-12-05 MED ORDER — TETRACAINE HCL 0.5 % OP SOLN
1.0000 [drp] | OPHTHALMIC | Status: AC
  Administered 2012-12-05 (×3): 1 [drp] via OPHTHALMIC

## 2012-12-05 MED ORDER — POVIDONE-IODINE 5 % OP SOLN
OPHTHALMIC | Status: DC | PRN
  Administered 2012-12-05: 1 via OPHTHALMIC

## 2012-12-05 MED ORDER — CYCLOPENTOLATE-PHENYLEPHRINE 0.2-1 % OP SOLN
1.0000 [drp] | OPHTHALMIC | Status: AC
  Administered 2012-12-05 (×3): 1 [drp] via OPHTHALMIC

## 2012-12-05 MED ORDER — LIDOCAINE HCL (PF) 1 % IJ SOLN
INTRAMUSCULAR | Status: DC | PRN
  Administered 2012-12-05: .4 mL

## 2012-12-05 MED ORDER — EPINEPHRINE HCL 1 MG/ML IJ SOLN
INTRAMUSCULAR | Status: AC
  Filled 2012-12-05: qty 1

## 2012-12-05 MED ORDER — NEOMYCIN-POLYMYXIN-DEXAMETH 0.1 % OP OINT
TOPICAL_OINTMENT | OPHTHALMIC | Status: DC | PRN
  Administered 2012-12-05: 1 via OPHTHALMIC

## 2012-12-05 MED ORDER — PHENYLEPHRINE HCL 2.5 % OP SOLN
OPHTHALMIC | Status: AC
  Filled 2012-12-05: qty 15

## 2012-12-05 MED ORDER — PHENYLEPHRINE HCL 2.5 % OP SOLN
1.0000 [drp] | OPHTHALMIC | Status: AC
  Administered 2012-12-05 (×3): 1 [drp] via OPHTHALMIC

## 2012-12-05 MED ORDER — ONDANSETRON HCL 4 MG/2ML IJ SOLN: 4.0000 mg | Freq: Once | INTRAMUSCULAR | Status: DC | PRN

## 2012-12-05 MED ORDER — LIDOCAINE HCL 3.5 % OP GEL
1.0000 "application " | Freq: Once | OPHTHALMIC | Status: AC
  Administered 2012-12-05: 1 via OPHTHALMIC

## 2012-12-05 MED ORDER — LACTATED RINGERS IV SOLN
INTRAVENOUS | Status: DC
  Administered 2012-12-05: 1000 mL via INTRAVENOUS

## 2012-12-05 MED ORDER — MIDAZOLAM HCL 2 MG/2ML IJ SOLN
INTRAMUSCULAR | Status: AC
  Filled 2012-12-05: qty 2

## 2012-12-05 MED ORDER — FENTANYL CITRATE 0.05 MG/ML IJ SOLN: 25.0000 ug | INTRAMUSCULAR | Status: DC | PRN

## 2012-12-05 MED ORDER — NA HYALUR & NA CHOND-NA HYALUR 0.55-0.5 ML IO KIT
PACK | INTRAOCULAR | Status: DC | PRN
  Administered 2012-12-05: 1 via OPHTHALMIC

## 2012-12-05 SURGICAL SUPPLY — 31 items
CAPSULAR TENSION RING-AMO (OPHTHALMIC RELATED) IMPLANT
CLOTH BEACON ORANGE TIMEOUT ST (SAFETY) ×2 IMPLANT
EYE SHIELD UNIVERSAL CLEAR (GAUZE/BANDAGES/DRESSINGS) ×2 IMPLANT
GLOVE BIO SURGEON STRL SZ 6.5 (GLOVE) IMPLANT
GLOVE BIOGEL PI IND STRL 6.5 (GLOVE) ×1 IMPLANT
GLOVE BIOGEL PI IND STRL 7.0 (GLOVE) ×1 IMPLANT
GLOVE BIOGEL PI IND STRL 7.5 (GLOVE) IMPLANT
GLOVE BIOGEL PI INDICATOR 6.5 (GLOVE) ×1
GLOVE BIOGEL PI INDICATOR 7.0 (GLOVE) ×1
GLOVE BIOGEL PI INDICATOR 7.5 (GLOVE)
GLOVE ECLIPSE 6.5 STRL STRAW (GLOVE) IMPLANT
GLOVE ECLIPSE 7.0 STRL STRAW (GLOVE) IMPLANT
GLOVE ECLIPSE 7.5 STRL STRAW (GLOVE) IMPLANT
GLOVE EXAM NITRILE LRG STRL (GLOVE) IMPLANT
GLOVE EXAM NITRILE MD LF STRL (GLOVE) IMPLANT
GLOVE SKINSENSE NS SZ6.5 (GLOVE)
GLOVE SKINSENSE NS SZ7.0 (GLOVE)
GLOVE SKINSENSE STRL SZ6.5 (GLOVE) IMPLANT
GLOVE SKINSENSE STRL SZ7.0 (GLOVE) IMPLANT
KIT VITRECTOMY (OPHTHALMIC RELATED) IMPLANT
PAD ARMBOARD 7.5X6 YLW CONV (MISCELLANEOUS) ×2 IMPLANT
PROC W NO LENS (INTRAOCULAR LENS)
PROC W SPEC LENS (INTRAOCULAR LENS)
PROCESS W NO LENS (INTRAOCULAR LENS) IMPLANT
PROCESS W SPEC LENS (INTRAOCULAR LENS) IMPLANT
RING MALYGIN (MISCELLANEOUS) IMPLANT
SIGHTPATH CAT PROC W REG LENS (Ophthalmic Related) ×2 IMPLANT
SYR TB 1ML LL NO SAFETY (SYRINGE) ×2 IMPLANT
TAPE CLOTH SOFT 2X10 (GAUZE/BANDAGES/DRESSINGS) ×2 IMPLANT
VISCOELASTIC ADDITIONAL (OPHTHALMIC RELATED) IMPLANT
WATER STERILE IRR 250ML POUR (IV SOLUTION) ×2 IMPLANT

## 2012-12-05 NOTE — Op Note (Signed)
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  Assistants: None  Anesthesia: Topical with MAC  Indications: Painless, progressive loss of vision with compromise of daily activities.  Surgery: Cataract Extraction with Intraocular lens Implant Right Eye  Discription: The patient had dilating drops and viscous lidocaine placed into the left eye in the pre-op holding area. After transfer to the operating room, a time out was performed. The patient was then prepped and draped. Beginning with a 75 degree blade a paracentesis port was made at the surgeon's 2 o'clock position. The anterior chamber was then filled with 1% non-preserved lidocaine. This was followed by filling the anterior chamber with Provisc. A bent cystatome needle was used to create a continuous tear capsulotomy. Hydrodissection was performed with balanced salt solution on a Fine canula. The lens nucleus was then removed using the phacoemulsification handpiece. Residual cortex was removed with the I&A handpiece. The anterior chamber and capsular bag were refilled with Provisc. A posterior chamber intraocular lens was placed into the capsular bag with it's injector. The implant was positioned with the Kuglan hook. The Provisc was then removed from the anterior chamber and capsular bag with the I&A handpiece. Stromal hydration of the main incision and paracentesis port was performed with BSS on a Fine canula. The wounds were tested for leak which was negative. The patient tolerated the procedure well. There were no operative complications. The patient was then transferred to the recovery room in stable condition.  Complications: None  Specimen: None  EBL: None  Prosthetic device: B&L enVista, MX60, power 21.5D, SN [id number].

## 2012-12-05 NOTE — Anesthesia Preprocedure Evaluation (Signed)
Anesthesia Evaluation  Patient identified by MRN, date of birth, ID band Patient awake    Reviewed: Allergy & Precautions, H&P , NPO status , Patient's Chart, lab work & pertinent test results  Airway Mallampati: II TM Distance: >3 FB     Dental  (+) Teeth Intact and Implants   Pulmonary shortness of breath, pneumonia -, resolved, COPD breath sounds clear to auscultation        Cardiovascular hypertension, Pt. on medications + CAD, + Past MI and + Peripheral Vascular Disease + dysrhythmias Supra Ventricular Tachycardia Rhythm:Regular Rate:Normal     Neuro/Psych    GI/Hepatic GERD-  Medicated,  Endo/Other  Hypothyroidism   Renal/GU      Musculoskeletal   Abdominal   Peds  Hematology   Anesthesia Other Findings   Reproductive/Obstetrics                           Anesthesia Physical Anesthesia Plan  ASA: III  Anesthesia Plan: MAC   Post-op Pain Management:    Induction: Intravenous  Airway Management Planned: Nasal Cannula  Additional Equipment:   Intra-op Plan:   Post-operative Plan:   Informed Consent: I have reviewed the patients History and Physical, chart, labs and discussed the procedure including the risks, benefits and alternatives for the proposed anesthesia with the patient or authorized representative who has indicated his/her understanding and acceptance.     Plan Discussed with:   Anesthesia Plan Comments:         Anesthesia Quick Evaluation

## 2012-12-05 NOTE — Anesthesia Postprocedure Evaluation (Signed)
  Anesthesia Post-op Note  Patient: Mckenzie [name]  Procedure(s) Performed: Procedure(s) with comments: CATARACT EXTRACTION PHACO AND INTRAOCULAR LENS PLACEMENT (IOC) (Right) - CDE:15.30  Patient Location: PACU and Short Stay  Anesthesia Type:MAC  Level of Consciousness: awake, alert  and oriented  Airway and Oxygen Therapy: Patient Spontanous Breathing  Post-op Pain: none  Post-op Assessment: Post-op Vital signs reviewed, Patient's Cardiovascular Status Stable, Respiratory Function Stable, Patent Airway and No signs of Nausea or vomiting  Post-op Vital Signs: Reviewed and stable  Complications: No apparent anesthesia complications

## 2012-12-05 NOTE — H&P (Signed)
I have reviewed the H&P, the patient was re-examined, and I have identified no interval changes in medical condition and plan of care since the history and physical of record  

## 2012-12-05 NOTE — Transfer of Care (Signed)
Immediate Anesthesia Transfer of Care Note  Patient: Mckenzie [name]  Procedure(s) Performed: Procedure(s) with comments: CATARACT EXTRACTION PHACO AND INTRAOCULAR LENS PLACEMENT (IOC) (Right) - CDE:15.30  Patient Location: PACU and Short Stay  Anesthesia Type:MAC  Level of Consciousness: awake  Airway & Oxygen Therapy: Patient Spontanous Breathing  Post-op Assessment: Report given to PACU RN  Post vital signs: Reviewed  Complications: No apparent anesthesia complications

## 2012-12-06 ENCOUNTER — Encounter (HOSPITAL_COMMUNITY): Payer: Self-pay | Admitting: Ophthalmology

## 2012-12-07 ENCOUNTER — Encounter (HOSPITAL_COMMUNITY): Payer: Medicare Other

## 2012-12-07 ENCOUNTER — Encounter (HOSPITAL_COMMUNITY): Payer: Self-pay | Admitting: *Deleted

## 2012-12-12 DIAGNOSIS — H2510 Age-related nuclear cataract, unspecified eye: Secondary | ICD-10-CM | POA: Diagnosis not present

## 2012-12-12 DIAGNOSIS — Z961 Presence of intraocular lens: Secondary | ICD-10-CM | POA: Diagnosis not present

## 2012-12-14 MED ORDER — NEOMYCIN-POLYMYXIN-DEXAMETH 3.5-10000-0.1 OP OINT
TOPICAL_OINTMENT | OPHTHALMIC | Status: AC
  Filled 2012-12-14: qty 3.5

## 2012-12-14 MED ORDER — CYCLOPENTOLATE-PHENYLEPHRINE 0.2-1 % OP SOLN
OPHTHALMIC | Status: AC
  Filled 2012-12-14: qty 2

## 2012-12-14 MED ORDER — LIDOCAINE HCL 3.5 % OP GEL
OPHTHALMIC | Status: AC
  Filled 2012-12-14: qty 5

## 2012-12-14 MED ORDER — TETRACAINE HCL 0.5 % OP SOLN
OPHTHALMIC | Status: AC
  Filled 2012-12-14: qty 2

## 2012-12-14 MED ORDER — LIDOCAINE HCL (PF) 1 % IJ SOLN
INTRAMUSCULAR | Status: AC
  Filled 2012-12-14: qty 2

## 2012-12-15 ENCOUNTER — Encounter (HOSPITAL_COMMUNITY): Payer: Self-pay | Admitting: Anesthesiology

## 2012-12-15 ENCOUNTER — Ambulatory Visit (HOSPITAL_COMMUNITY)
Admission: RE | Admit: 2012-12-15 | Discharge: 2012-12-15 | Disposition: A | Discharge status: Home / Self Care | Payer: Medicare Other | Source: Ambulatory Visit | Attending: Ophthalmology | Admitting: Ophthalmology

## 2012-12-15 ENCOUNTER — Encounter (HOSPITAL_COMMUNITY)
Admission: RE | Disposition: A | Discharge status: Home / Self Care | Payer: Self-pay | Source: Ambulatory Visit | Attending: Ophthalmology

## 2012-12-15 ENCOUNTER — Ambulatory Visit (HOSPITAL_COMMUNITY): Payer: Medicare Other | Admitting: Anesthesiology

## 2012-12-15 ENCOUNTER — Encounter (HOSPITAL_COMMUNITY): Payer: Self-pay | Admitting: *Deleted

## 2012-12-15 DIAGNOSIS — H2510 Age-related nuclear cataract, unspecified eye: Secondary | ICD-10-CM | POA: Insufficient documentation

## 2012-12-15 DIAGNOSIS — I10 Essential (primary) hypertension: Secondary | ICD-10-CM | POA: Insufficient documentation

## 2012-12-15 DIAGNOSIS — J449 Chronic obstructive pulmonary disease, unspecified: Secondary | ICD-10-CM | POA: Insufficient documentation

## 2012-12-15 DIAGNOSIS — H269 Unspecified cataract: Secondary | ICD-10-CM | POA: Diagnosis not present

## 2012-12-15 DIAGNOSIS — Z961 Presence of intraocular lens: Secondary | ICD-10-CM | POA: Diagnosis not present

## 2012-12-15 DIAGNOSIS — J4489 Other specified chronic obstructive pulmonary disease: Secondary | ICD-10-CM | POA: Insufficient documentation

## 2012-12-15 HISTORY — PX: CATARACT EXTRACTION W/PHACO: SHX586

## 2012-12-15 SURGERY — PHACOEMULSIFICATION, CATARACT, WITH IOL INSERTION
Anesthesia: Monitor Anesthesia Care | Site: Eye | Laterality: Left | Wound class: Clean

## 2012-12-15 MED ORDER — NA HYALUR & NA CHOND-NA HYALUR 0.55-0.5 ML IO KIT
PACK | INTRAOCULAR | Status: DC | PRN
  Administered 2012-12-15: 1 via OPHTHALMIC

## 2012-12-15 MED ORDER — POVIDONE-IODINE 5 % OP SOLN
OPHTHALMIC | Status: DC | PRN
  Administered 2012-12-15: 1 via OPHTHALMIC

## 2012-12-15 MED ORDER — PHENYLEPHRINE HCL 10 MG/ML IJ SOLN
INTRAMUSCULAR | Status: AC
  Filled 2012-12-15: qty 1

## 2012-12-15 MED ORDER — MIDAZOLAM HCL 2 MG/2ML IJ SOLN
INTRAMUSCULAR | Status: AC
  Filled 2012-12-15: qty 2

## 2012-12-15 MED ORDER — CYCLOPENTOLATE-PHENYLEPHRINE 0.2-1 % OP SOLN
1.0000 [drp] | OPHTHALMIC | Status: AC
  Administered 2012-12-15 (×3): 1 [drp] via OPHTHALMIC

## 2012-12-15 MED ORDER — PHENYLEPHRINE HCL 2.5 % OP SOLN
OPHTHALMIC | Status: AC
  Filled 2012-12-15: qty 15

## 2012-12-15 MED ORDER — PHENYLEPHRINE HCL 2.5 % OP SOLN
1.0000 [drp] | OPHTHALMIC | Status: AC
  Administered 2012-12-15 (×3): 1 [drp] via OPHTHALMIC

## 2012-12-15 MED ORDER — EPINEPHRINE HCL 1 MG/ML IJ SOLN
INTRAOCULAR | Status: DC | PRN
  Administered 2012-12-15: 10:00:00

## 2012-12-15 MED ORDER — NEOMYCIN-POLYMYXIN-DEXAMETH 0.1 % OP OINT
TOPICAL_OINTMENT | OPHTHALMIC | Status: DC | PRN
  Administered 2012-12-15: 1 via OPHTHALMIC

## 2012-12-15 MED ORDER — LIDOCAINE HCL (PF) 1 % IJ SOLN
INTRAMUSCULAR | Status: DC | PRN
  Administered 2012-12-15: .7 mL

## 2012-12-15 MED ORDER — EPINEPHRINE HCL 1 MG/ML IJ SOLN
INTRAMUSCULAR | Status: AC
  Filled 2012-12-15: qty 1

## 2012-12-15 MED ORDER — PHENYLEPHRINE HCL 10 MG/ML IJ SOLN
INTRAMUSCULAR | Status: DC | PRN
  Administered 2012-12-15: .5 mg

## 2012-12-15 MED ORDER — LIDOCAINE 3.5 % OP GEL OPTIME - NO CHARGE
OPHTHALMIC | Status: DC | PRN
  Administered 2012-12-15: 1 [drp] via OPHTHALMIC

## 2012-12-15 MED ORDER — LIDOCAINE HCL 3.5 % OP GEL
1.0000 "application " | Freq: Once | OPHTHALMIC | Status: AC
  Administered 2012-12-15: 1 via OPHTHALMIC

## 2012-12-15 MED ORDER — TETRACAINE HCL 0.5 % OP SOLN
1.0000 [drp] | OPHTHALMIC | Status: AC
  Administered 2012-12-15 (×3): 1 [drp] via OPHTHALMIC

## 2012-12-15 MED ORDER — LACTATED RINGERS IV SOLN
INTRAVENOUS | Status: DC
  Administered 2012-12-15: 09:00:00 via INTRAVENOUS

## 2012-12-15 MED ORDER — BSS IO SOLN
INTRAOCULAR | Status: DC | PRN
  Administered 2012-12-15: 15 mL via INTRAOCULAR

## 2012-12-15 MED ORDER — MIDAZOLAM HCL 2 MG/2ML IJ SOLN
1.0000 mg | INTRAMUSCULAR | Status: DC | PRN
  Administered 2012-12-15: 2 mg via INTRAVENOUS

## 2012-12-15 SURGICAL SUPPLY — 32 items

## 2012-12-15 NOTE — Op Note (Signed)
Date of Admission: 12/15/2012  Date of Surgery: 12/15/2012  Pre-Op Dx: Cataract  Left  Eye  Post-Op Dx: Cataract  Left  Eye,  Dx Code 366.16  Surgeon: Gemma Payor, M.D.  Assistants: None  Anesthesia: Topical with MAC  Indications: Painless, progressive loss of vision with compromise of daily activities.  Surgery: Cataract Extraction with Intraocular lens Implant Left Eye  Discription: The patient had dilating drops and viscous lidocaine placed into the left eye in the pre-op holding area. After transfer to the operating room, a time out was performed. The patient was then prepped and draped. Beginning with a 75 degree blade a paracentesis port was made at the surgeon's 2 o'clock position. The anterior chamber was then filled with 1% non-preserved lidocaine. This was followed by filling the anterior chamber with Provisc. A bent cystatome needle was used to create a continuous tear capsulotomy. Hydrodissection was performed with balanced salt solution on a Fine canula. The lens nucleus was then removed using the phacoemulsification handpiece. Residual cortex was removed with the I&A handpiece. The anterior chamber and capsular bag were refilled with Provisc. A posterior chamber intraocular lens was placed into the capsular bag with it's injector. The implant was positioned with the Kuglan hook. The Provisc was then removed from the anterior chamber and capsular bag with the I&A handpiece. Stromal hydration of the main incision and paracentesis port was performed with BSS on a Fine canula. The wounds were tested for leak which was negative. The patient tolerated the procedure well. There were no operative complications. The patient was then transferred to the recovery room in stable condition.  Complications: None  Specimen: None  EBL: None  Prosthetic device: B&L enVista, MX60, power 21.0D, #[id number].

## 2012-12-15 NOTE — H&P (Signed)
I have reviewed the H&P, the patient was re-examined, and I have identified no interval changes in medical condition and plan of care since the history and physical of record  

## 2012-12-15 NOTE — Transfer of Care (Signed)
Immediate Anesthesia Transfer of Care Note  Patient: Mckenzie [name]  Procedure(s) Performed: Procedure(s) with comments: CATARACT EXTRACTION PHACO AND INTRAOCULAR LENS PLACEMENT (IOC) (Left) - CDE:22.56  Patient Location: Short Stay  Anesthesia Type:MAC  Level of Consciousness: awake, alert , oriented and patient cooperative  Airway & Oxygen Therapy: Patient Spontanous Breathing  Post-op Assessment: Report given to PACU RN and Post -op Vital signs reviewed and stable  Post vital signs: Reviewed and stable  Complications: No apparent anesthesia complications

## 2012-12-15 NOTE — Anesthesia Preprocedure Evaluation (Signed)
Anesthesia Evaluation  Patient identified by MRN, date of birth, ID band Patient awake    Reviewed: Allergy & Precautions, H&P , NPO status , Patient's Chart, lab work & pertinent test results  Airway Mallampati: II TM Distance: >3 FB     Dental  (+) Teeth Intact and Implants   Pulmonary shortness of breath, pneumonia -, resolved, COPD breath sounds clear to auscultation        Cardiovascular hypertension, Pt. on medications + CAD, + Past MI and + Peripheral Vascular Disease + dysrhythmias Supra Ventricular Tachycardia Rhythm:Regular Rate:Normal     Neuro/Psych    GI/Hepatic GERD-  Medicated,  Endo/Other  Hypothyroidism   Renal/GU      Musculoskeletal   Abdominal   Peds  Hematology   Anesthesia Other Findings   Reproductive/Obstetrics                           Anesthesia Physical Anesthesia Plan  ASA: III  Anesthesia Plan: MAC   Post-op Pain Management:    Induction: Intravenous  Airway Management Planned: Nasal Cannula  Additional Equipment:   Intra-op Plan:   Post-operative Plan:   Informed Consent: I have reviewed the patients History and Physical, chart, labs and discussed the procedure including the risks, benefits and alternatives for the proposed anesthesia with the patient or authorized representative who has indicated his/her understanding and acceptance.     Plan Discussed with:   Anesthesia Plan Comments:         Anesthesia Quick Evaluation

## 2012-12-15 NOTE — Anesthesia Postprocedure Evaluation (Signed)
  Anesthesia Post-op Note  Patient: Mckenzie [name]  Procedure(s) Performed: Procedure(s) with comments: CATARACT EXTRACTION PHACO AND INTRAOCULAR LENS PLACEMENT (IOC) (Left) - CDE:22.56  Patient Location: Short Stay  Anesthesia Type:MAC  Level of Consciousness: awake, alert , oriented and patient cooperative  Airway and Oxygen Therapy: Patient Spontanous Breathing  Post-op Pain: none  Post-op Assessment: Post-op Vital signs reviewed, Patient's Cardiovascular Status Stable, Respiratory Function Stable, No signs of Nausea or vomiting and Pain level controlled  Post-op Vital Signs: Reviewed and stable  Complications: No apparent anesthesia complications

## 2012-12-19 ENCOUNTER — Encounter (HOSPITAL_COMMUNITY): Payer: Self-pay | Admitting: Ophthalmology

## 2013-01-04 DIAGNOSIS — IMO0002 Reserved for concepts with insufficient information to code with codable children: Secondary | ICD-10-CM | POA: Diagnosis not present

## 2013-01-04 DIAGNOSIS — M47817 Spondylosis without myelopathy or radiculopathy, lumbosacral region: Secondary | ICD-10-CM | POA: Diagnosis not present

## 2013-01-04 DIAGNOSIS — M4716 Other spondylosis with myelopathy, lumbar region: Secondary | ICD-10-CM | POA: Diagnosis not present

## 2013-01-04 DIAGNOSIS — M5137 Other intervertebral disc degeneration, lumbosacral region: Secondary | ICD-10-CM | POA: Diagnosis not present

## 2013-01-18 ENCOUNTER — Other Ambulatory Visit: Payer: Self-pay

## 2013-01-30 DIAGNOSIS — H18519 Endothelial corneal dystrophy, unspecified eye: Secondary | ICD-10-CM | POA: Diagnosis not present

## 2013-02-01 DIAGNOSIS — M47817 Spondylosis without myelopathy or radiculopathy, lumbosacral region: Secondary | ICD-10-CM | POA: Diagnosis not present

## 2013-03-09 DIAGNOSIS — M5137 Other intervertebral disc degeneration, lumbosacral region: Secondary | ICD-10-CM | POA: Diagnosis not present

## 2013-03-09 DIAGNOSIS — M47817 Spondylosis without myelopathy or radiculopathy, lumbosacral region: Secondary | ICD-10-CM | POA: Diagnosis not present

## 2013-03-09 DIAGNOSIS — M5380 Other specified dorsopathies, site unspecified: Secondary | ICD-10-CM | POA: Diagnosis not present

## 2013-03-09 DIAGNOSIS — IMO0001 Reserved for inherently not codable concepts without codable children: Secondary | ICD-10-CM | POA: Diagnosis not present

## 2013-03-17 DIAGNOSIS — Z23 Encounter for immunization: Secondary | ICD-10-CM | POA: Diagnosis not present

## 2013-03-24 ENCOUNTER — Encounter: Payer: Self-pay | Admitting: Family

## 2013-03-27 ENCOUNTER — Ambulatory Visit (HOSPITAL_COMMUNITY)
Admission: RE | Admit: 2013-03-27 | Discharge: 2013-03-27 | Disposition: A | Discharge status: Home / Self Care | Payer: Medicare Other | Source: Ambulatory Visit | Attending: Family | Admitting: Family

## 2013-03-27 ENCOUNTER — Encounter: Payer: Self-pay | Admitting: Family

## 2013-03-27 ENCOUNTER — Ambulatory Visit (INDEPENDENT_AMBULATORY_CARE_PROVIDER_SITE_OTHER): Payer: Medicare Other | Admitting: Family

## 2013-03-27 DIAGNOSIS — I6529 Occlusion and stenosis of unspecified carotid artery: Secondary | ICD-10-CM

## 2013-03-27 NOTE — Progress Notes (Signed)
Established Carotid Patient  Previous Carotid surgery: No  History of Present Illness  Mckenzie [name] is a 77 y.o. female patient of Dr. Myra Gianotti who has known carotid stenosis. This was originally detected by auscultation of a carotid bruit. At that time duplex revealed 50-69% stenosis in both carotid arteries. This was in 2010. She has been asymptomatic the entire time. Had surgery for 4 lumbar HNP's and has severe left sciatic pain. Patient also reports severe osteoporosis in her spine, is under treatment for this.  Has hemolytic anemia and needs to take prednisone qod for this which worsens osteoporosis.  Her endocrinologist is at [facility] now. Dr. Phillis Haggis is her hematologist locally.  Had MI in 2002 during hospitalization for ruptured appendix. Had cardiac stent placed in 2003. Patient denies ever having a stroke.   The patient denies amaurosis fugax or monocular blindness.  The patient  denies facial drooping.  Pt. denies hemiplegia.  The patient denies receptive or expressive aphasia.  Pt. denies extremity weakness.   Patient denies New Medical or Surgical History.  Pt Diabetic: No Pt smoker: former smoker, quit 2002  Pt meds include: Statin : Yes ASA: No Other anticoagulants/antiplatelets: Plavix   Past Medical History  Diagnosis Date  . Left ventricular hypertrophy     EF 65%, echo, April, 2011  . CAD (coronary artery disease)     Stent to LAD, 2003 [facility] / nuclear scan April, 201 to artifact / persistent shortness of breath / catheterization Oct 21, 2010... widely patent stent to the mid LAD, no significant obstructive disease, should be low risk    for back surgery  . Aortic valve sclerosis     Mild, echo, April, 2012  . MR (mitral regurgitation)      Mild echo, 2012 ///  Mild per Echo 2009,  mild, echo, April, 2011  . Carotid arterial disease     Moderate-Dr. Myra Gianotti following  . GERD (gastroesophageal reflux disease)   . Hypothyroidism   . ARDS (adult respiratory  distress syndrome) 2003    with tracheostomy-peritonitis  . UTI (urinary tract infection)     Chronic-on Macrodantin  . Hemolytic anemia     Followed by Dr. Derald Macleod  . Diastolic dysfunction     Echo, April, 2011   EF 65%  . Shortness of breath     March, 2012  . Palpitations     Holter, September 16, 2010, PACs and PVCs,one 4 beat run of SVT  . Back pain     Need for surgery, Dr. Trey Sailors  . Ejection fraction     EF 65%, echo, September 15, 2010  . Preoperative clearance     Preop clearance for back surgery  . COPD (chronic obstructive pulmonary disease)   . Pneumonia 11-2011  . Myocardial infarction   . Hypertension   . Dysrhythmia     afib  . Arthritis   . Chronic back pain     Social History History  Substance Use Topics  . Smoking status: Former Smoker -- 1.00 packs/day for 20 years    Types: Cigarettes    Quit date: 06/15/2000  . Smokeless tobacco: Never Used  . Alcohol Use: No    Family History Family History  Problem Relation Age of Onset  . COPD Other     Surgical History Past Surgical History  Procedure Laterality Date  . Appendectomy    . Tonsillectomy    . Tracheostomy  2003    Following ARDS   . Thyroid surgery  08/2009    Benign adenomas  . Coronary angioplasty with stent placement  2003  . Repair of ruptured lumbar discs  12-2011  . Breast lumpectomy Left   . Back surgery    . Cataract extraction w/phaco Right 12/05/2012    Procedure: CATARACT EXTRACTION PHACO AND INTRAOCULAR LENS PLACEMENT (IOC);  Surgeon: Gemma Payor, MD;  Location: [facility];  Service: Ophthalmology;  Laterality: Right;  CDE:15.30  . Cataract extraction w/phaco Left 12/15/2012    Procedure: CATARACT EXTRACTION PHACO AND INTRAOCULAR LENS PLACEMENT (IOC);  Surgeon: Gemma Payor, MD;  Location: [facility];  Service: Ophthalmology;  Laterality: Left;  CDE:22.56    Allergies  Allergen Reactions  . Sulfamethoxazole-Trimethoprim Nausea Only  . Tetracycline Nausea Only    Current Outpatient  Prescriptions  Medication Sig Dispense Refill  . albuterol (PROVENTIL HFA;VENTOLIN HFA) 108 (90 BASE) MCG/ACT inhaler Inhale 2 puffs into the lungs every 6 (six) hours as needed for wheezing.      Marland Kitchen alendronate (FOSAMAX) 70 MG tablet Take 70 mg by mouth every 7 (seven) days. Tuesday      . amoxicillin (AMOXIL) 500 MG capsule 500 mg 3 (three) times daily.       Marland Kitchen atorvastatin (LIPITOR) 10 MG tablet Take 10 mg by mouth daily.       . benazepril (LOTENSIN) 5 MG tablet Take 5 mg by mouth daily.       . calcium-vitamin D (OSCAL WITH D) 500-200 MG-UNIT per tablet Take 1 tablet by mouth daily.      . Cholecalciferol (VITAMIN D3) 1000 UNITS CAPS Take 1,000 Units by mouth daily. Take one tab daily      . clopidogrel (PLAVIX) 75 MG tablet Take 75 mg by mouth daily.       Marland Kitchen conjugated estrogens (PREMARIN) vaginal cream Place 1 g vaginally every other day.       . diltiazem (CARDIZEM CD) 120 MG 24 hr capsule Take 120 mg by mouth daily.      . diphenhydrAMINE (BENADRYL) 25 mg capsule Take 25 mg by mouth daily as needed for sleep.       Marland Kitchen docusate sodium (COLACE) 100 MG capsule 100 mg. Take 2 tabs twice a day       . esomeprazole (NEXIUM) 40 MG capsule Take 40 mg by mouth daily before breakfast.       . Ferrous Gluconate (IRON) 240 (27 FE) MG TABS Take 1 tablet by mouth daily.      . folic acid (FOLVITE) 800 MCG tablet Take 800 mcg by mouth daily.      . hyoscyamine (LEVSIN SL) 0.125 MG SL tablet Take 0.125 mg by mouth every 6 (six) hours as needed for cramping or diarrhea or loose stools.       Marland Kitchen levothyroxine (SYNTHROID, LEVOTHROID) 75 MCG tablet Take 75 mcg by mouth daily before breakfast.       . loratadine (CLARITIN) 10 MG tablet Take 10 mg by mouth daily.       Marland Kitchen MELATONIN PO Take 1 tablet by mouth at bedtime.      Marland Kitchen morphine (MS CONTIN) 15 MG 12 hr tablet 15 mg every 8 (eight) hours.       Marland Kitchen morphine (MS CONTIN) 30 MG 12 hr tablet Take 30 mg by mouth every 8 (eight) hours.       Marland Kitchen morphine (MSIR) 15  MG tablet Take 15 mg by mouth every 6 (six) hours as needed for pain.       Marland Kitchen  Multiple Vitamin (MULTIVITAMIN) tablet Take 1 tablet by mouth daily.       . nitrofurantoin (MACRODANTIN) 100 MG capsule Take 100 mg by mouth at bedtime.       . nitroGLYCERIN (NITROSTAT) 0.4 MG SL tablet Place 0.4 mg under the tongue every 5 (five) minutes as needed. Place one tablet under tongue every 5 minutes up to 3 doses as needed for chest pain. No more than 3 doses over a 15 minute period.      . phenazopyridine (PYRIDIUM) 200 MG tablet Take 200 mg by mouth 2 (two) times daily.       . polyethylene glycol powder (GLYCOLAX/MIRALAX) powder Take 17 g by mouth daily as needed (Constipation).       . predniSONE (DELTASONE) 5 MG tablet Take 2.5 mg by mouth every other day.       . raloxifene (EVISTA) 60 MG tablet Take 60 mg by mouth daily.       Marland Kitchen SPIRIVA HANDIHALER 18 MCG inhalation capsule       . Tamsulosin HCl (FLOMAX) 0.4 MG CAPS Take 0.4 mg by mouth daily. Take 1 capsule by mouth daily.      . temazepam (RESTORIL) 30 MG capsule Take 30 mg by mouth at bedtime.       . tretinoin (RETIN-A) 0.05 % cream Apply 1 application topically at bedtime.       . [DISCONTINUED] diltiazem (CARDIZEM SR) 120 MG 12 hr capsule TAKE ONE CAPSULE BY MOUTH EVERY DAY  30 capsule  5  . [DISCONTINUED] diltiazem (TIAZAC) 120 MG 24 hr capsule Take 1 capsule (120 mg total) by mouth daily.  30 capsule  6   No current facility-administered medications for this visit.    Review of Systems : [x]  Positive   [ ]  Denies  General:[ ]  Weight loss,  [ ]  Weight gain, [ ]  Loss of appetite, [ ]  Fever, [ ]  chills  Neurologic: [ ]  Dizziness, [ ]  Blackouts, [ ]  Headaches, [ ]  Seizure [ ]  Stroke, [ ]  "Mini stroke", [ ]  Slurred speech, [ ]  Temporary blindness;  [ ] weakness,  Ear/Nose/Throat: [ ]  Change in hearing, [ ]  Nose bleeds, [ ]  Hoarseness  Vascular:[ ]  Pain in legs with walking, [ ]  Pain in feet while lying flat , [ ]   Non-healing ulcer, [ ]   Blood clot in vein,    Pulmonary: [ ]  Home oxygen, [ ]   Productive cough, [ ]  Bronchitis, [ ]  Coughing up blood,  [ ]  Asthma, [ ]  Wheezing  Musculoskeletal:  [ ]  Arthritis, [ ]  Joint pain, [ ]  low back pain  Cardiac: [ ]  Chest pain, [ ]  Shortness of breath when lying flat, [ ]  Shortness of breath with exertion, [ ]  Palpitations, [ ]  Heart murmur, [ ]   Atrial fibrillation  Hematologic:[ ]  Easy Bruising, [ ]  Anemia; [ ]  Hepatitis  Psychiatric: [ ]   Depression, [ ]  Anxiety   Gastrointestinal: [ ]  Black stool, [ ]  Blood in stool, [ ]  Peptic ulcer disease,  [ ]  Gastroesophageal Reflux, [ ]  Trouble swallowing, [ ]  Diarrhea, [ ]  Constipation  Urinary: [ ]  chronic Kidney disease, [ ]  on HD, [ ]  Burning with urination, [ ]  Frequent urination, [ ]  Difficulty urinating;   Skin: [ ]  Rashes, [ ]  Wounds    Physical Examination  Filed Vitals:   03/27/13 1619  BP: 131/54  Pulse: 61  Resp: 18   Filed Weights   03/27/13 1619  Weight: 125 lb 4.8  oz (56.836 kg)   Body mass index is 22.91 kg/(m^2).   General: WDWN female in NAD GAIT:  antalgic Eyes: PERRLA Pulmonary:  CTAB, Negative  Rales, Negative rhonchi, & Negative wheezing.  Cardiac: regular Rhythm ,  Positive Murmurs.  VASCULAR EXAM Carotid Bruits Left Right   Transmitted cardiac murmur Transmitted cardiac murmur    Aorta is not palpable. Radial pulses are 2+ palpable and equal.                                                                                                                            LE Pulses LEFT RIGHT       FEMORAL   palpable   palpable        POPLITEAL  not palpable   not palpable       POSTERIOR TIBIAL   palpable    palpable        DORSALIS PEDIS      ANTERIOR TIBIAL  palpable   palpable     Gastrointestinal: soft, nontender, BS WNL, no r/g,  negative masses.  Musculoskeletal: Negative muscle atrophy/wasting. M/S 5/5 throughout, Extremities without ischemic changes.  Neurologic: A&O X 3;  Appropriate Affect ; SENSATION ;normal;  Speech is normal CN 2-12 intact except, Pain and light touch intact in extremities, Motor exam as listed above.   Non-Invasive Vascular Imaging CAROTID DUPLEX 03/27/2013   Right ICA: <40% stenosis. Left ICA: <40% stenosis.  These findings are Unchanged from previous exam.   Assessment: GENELLA BAS is a 77 y.o. female who presents with asymptomatic minimal bilateral internal carotid artery stenosis. She has had <40% bilateral ICA stenosis for the last 2 years and remains asymptomatic, has no history of TIA or stroke. The cardiac murmurs auscultated are transmitted to both carotid arteries.  Will therefore repeat carotid Duplex in 2 years, return sooner if needed.  Plan: Follow-up in 2 yeasr with Carotid Duplex scan.  I discussed in depth with the patient the nature of atherosclerosis, and emphasized the importance of maximal medical management including strict control of blood pressure, blood glucose, and lipid levels, obtaining regular exercise, and continued cessation of smoking.  The patient is aware that without maximal medical management the underlying atherosclerotic disease process will progress, limiting the benefit of any interventions. The patient was given verbal information about stroke prevention and what symptoms should prompt the patient to seek immediate medical care. Thank you for allowing [place] to participate in this patient's care.  [name], RN, MSN, FNP-C [facility] Office: [phone]  Clinic Physician: Myra Gianotti  03/27/2013 4:54 PM

## 2013-03-28 NOTE — Addendum Note (Signed)
Addended by: Sharee Pimple on: 03/28/2013 11:35 AM   Modules accepted: Orders

## 2013-03-31 DIAGNOSIS — R0609 Other forms of dyspnea: Secondary | ICD-10-CM | POA: Diagnosis not present

## 2013-03-31 DIAGNOSIS — J449 Chronic obstructive pulmonary disease, unspecified: Secondary | ICD-10-CM | POA: Diagnosis not present

## 2013-04-20 ENCOUNTER — Other Ambulatory Visit: Payer: Self-pay

## 2013-04-25 DIAGNOSIS — E041 Nontoxic single thyroid nodule: Secondary | ICD-10-CM | POA: Diagnosis not present

## 2013-04-25 DIAGNOSIS — R1310 Dysphagia, unspecified: Secondary | ICD-10-CM | POA: Diagnosis not present

## 2013-04-25 DIAGNOSIS — Z79899 Other long term (current) drug therapy: Secondary | ICD-10-CM | POA: Diagnosis not present

## 2013-04-25 DIAGNOSIS — E890 Postprocedural hypothyroidism: Secondary | ICD-10-CM | POA: Diagnosis not present

## 2013-05-18 DIAGNOSIS — E041 Nontoxic single thyroid nodule: Secondary | ICD-10-CM | POA: Diagnosis not present

## 2013-05-18 DIAGNOSIS — E0789 Other specified disorders of thyroid: Secondary | ICD-10-CM | POA: Diagnosis not present

## 2013-05-18 DIAGNOSIS — E042 Nontoxic multinodular goiter: Secondary | ICD-10-CM | POA: Diagnosis not present

## 2013-05-25 DIAGNOSIS — M47817 Spondylosis without myelopathy or radiculopathy, lumbosacral region: Secondary | ICD-10-CM | POA: Diagnosis not present

## 2013-06-06 DIAGNOSIS — I359 Nonrheumatic aortic valve disorder, unspecified: Secondary | ICD-10-CM | POA: Diagnosis not present

## 2013-06-06 DIAGNOSIS — J449 Chronic obstructive pulmonary disease, unspecified: Secondary | ICD-10-CM | POA: Diagnosis not present

## 2013-06-06 DIAGNOSIS — E039 Hypothyroidism, unspecified: Secondary | ICD-10-CM | POA: Diagnosis not present

## 2013-06-06 DIAGNOSIS — M545 Low back pain, unspecified: Secondary | ICD-10-CM | POA: Diagnosis not present

## 2013-06-06 DIAGNOSIS — M810 Age-related osteoporosis without current pathological fracture: Secondary | ICD-10-CM | POA: Diagnosis not present

## 2013-06-06 DIAGNOSIS — G4700 Insomnia, unspecified: Secondary | ICD-10-CM | POA: Diagnosis not present

## 2013-06-06 DIAGNOSIS — I10 Essential (primary) hypertension: Secondary | ICD-10-CM | POA: Diagnosis not present

## 2013-06-06 DIAGNOSIS — E782 Mixed hyperlipidemia: Secondary | ICD-10-CM | POA: Diagnosis not present

## 2013-06-19 DIAGNOSIS — H04129 Dry eye syndrome of unspecified lacrimal gland: Secondary | ICD-10-CM | POA: Diagnosis not present

## 2013-06-30 ENCOUNTER — Ambulatory Visit (INDEPENDENT_AMBULATORY_CARE_PROVIDER_SITE_OTHER): Payer: Medicare Other | Admitting: Urology

## 2013-06-30 DIAGNOSIS — R339 Retention of urine, unspecified: Secondary | ICD-10-CM | POA: Diagnosis not present

## 2013-06-30 DIAGNOSIS — N3020 Other chronic cystitis without hematuria: Secondary | ICD-10-CM | POA: Diagnosis not present

## 2013-06-30 DIAGNOSIS — N3946 Mixed incontinence: Secondary | ICD-10-CM

## 2013-07-22 DIAGNOSIS — Z5181 Encounter for therapeutic drug level monitoring: Secondary | ICD-10-CM | POA: Diagnosis not present

## 2013-07-22 DIAGNOSIS — Z01812 Encounter for preprocedural laboratory examination: Secondary | ICD-10-CM | POA: Diagnosis not present

## 2013-07-22 DIAGNOSIS — Z79899 Other long term (current) drug therapy: Secondary | ICD-10-CM | POA: Diagnosis not present

## 2013-08-16 DIAGNOSIS — B009 Herpesviral infection, unspecified: Secondary | ICD-10-CM | POA: Diagnosis not present

## 2013-08-16 DIAGNOSIS — N390 Urinary tract infection, site not specified: Secondary | ICD-10-CM | POA: Diagnosis not present

## 2013-08-16 DIAGNOSIS — L03039 Cellulitis of unspecified toe: Secondary | ICD-10-CM | POA: Diagnosis not present

## 2013-08-22 ENCOUNTER — Encounter: Payer: Self-pay | Admitting: Cardiology

## 2013-08-23 ENCOUNTER — Encounter: Payer: Self-pay | Admitting: Cardiology

## 2013-08-23 ENCOUNTER — Ambulatory Visit (INDEPENDENT_AMBULATORY_CARE_PROVIDER_SITE_OTHER): Payer: Medicare Other | Admitting: Cardiology

## 2013-08-23 VITALS — BP 114/45 | HR 69 | Ht 63.0 in | Wt 127.0 lb

## 2013-08-23 DIAGNOSIS — I359 Nonrheumatic aortic valve disorder, unspecified: Secondary | ICD-10-CM | POA: Diagnosis not present

## 2013-08-23 DIAGNOSIS — R233 Spontaneous ecchymoses: Secondary | ICD-10-CM

## 2013-08-23 DIAGNOSIS — I358 Other nonrheumatic aortic valve disorders: Secondary | ICD-10-CM

## 2013-08-23 DIAGNOSIS — R002 Palpitations: Secondary | ICD-10-CM

## 2013-08-23 DIAGNOSIS — I779 Disorder of arteries and arterioles, unspecified: Secondary | ICD-10-CM | POA: Diagnosis not present

## 2013-08-23 DIAGNOSIS — I739 Peripheral vascular disease, unspecified: Secondary | ICD-10-CM

## 2013-08-23 DIAGNOSIS — E785 Hyperlipidemia, unspecified: Secondary | ICD-10-CM

## 2013-08-23 DIAGNOSIS — I2510 Atherosclerotic heart disease of native coronary artery without angina pectoris: Secondary | ICD-10-CM | POA: Diagnosis not present

## 2013-08-23 DIAGNOSIS — R238 Other skin changes: Secondary | ICD-10-CM | POA: Insufficient documentation

## 2013-08-23 MED ORDER — NITROGLYCERIN 0.4 MG SL SUBL: SUBLINGUAL_TABLET | SUBLINGUAL | Status: DC

## 2013-08-23 MED ORDER — DILTIAZEM HCL ER COATED BEADS 120 MG PO CP24: 120.0000 mg | ORAL_CAPSULE | Freq: Every day | ORAL | Status: DC

## 2013-08-23 NOTE — Assessment & Plan Note (Signed)
Her carotid disease is followed carefully by vascular surgery. She had a Doppler in October, 2014. It was stable.

## 2013-08-23 NOTE — Assessment & Plan Note (Signed)
She has a soft murmur. She has aortic valve sclerosis. She does not need an echo at this time.

## 2013-08-23 NOTE — Assessment & Plan Note (Signed)
She's having easy bruisability on dual antiplatelet therapy. Her procedure was done remotely. It will be safe now to stop her Plavix.  As part of today's evaluation I spent greater than 25 minutes with a total care. More than half of this time has been with direct contact with the patient discussing all of her issues and the plans.

## 2013-08-23 NOTE — Assessment & Plan Note (Signed)
The patient is on a small dose of atorvastatin. With the current guidelines the recommendation would be for consideration of using a dose of 40 mg. I will leave this up to the patient and her primary physician. This would be a consideration

## 2013-08-23 NOTE — Assessment & Plan Note (Signed)
The patient's coronary disease is stable. She is having easy bruisability. I've decided to continue her aspirin and stop her Plavix.

## 2013-08-23 NOTE — Assessment & Plan Note (Signed)
Historically the patient has a sensation of increased heart rate after walking. We know from the past that she has some PVCs and some short bursts of SVT. I decided not to change any of her medicines as of today.

## 2013-08-23 NOTE — Progress Notes (Signed)
HPI  Patient is seen today to followup coronary disease. I saw her last March, 2014. She underwent catheterization in 2012. Stent in her mid LAD was widely patent. Ejection fraction was normal. She has a history of having a pounding sensation in her chest sometimes after walking. She's had this before. She is not having any prolonged palpitations.  She does mention that she is having easy bruisability and shows many areas on her skin.  Allergies  Allergen Reactions  . Sulfamethoxazole-Trimethoprim Nausea Only  . Tetracycline Nausea Only    Current Outpatient Prescriptions  Medication Sig Dispense Refill  . albuterol (PROVENTIL HFA;VENTOLIN HFA) 108 (90 BASE) MCG/ACT inhaler Inhale 2 puffs into the lungs every 6 (six) hours as needed for wheezing.      Marland Kitchen alendronate (FOSAMAX) 70 MG tablet Take 70 mg by mouth every 7 (seven) days. Tuesday      . atorvastatin (LIPITOR) 10 MG tablet Take 10 mg by mouth daily.       . benazepril (LOTENSIN) 5 MG tablet Take 5 mg by mouth daily.       . bethanechol (URECHOLINE) 25 MG tablet Take 25 mg by mouth 4 (four) times daily.      . calcium-vitamin D (OSCAL WITH D) 500-200 MG-UNIT per tablet Take 1 tablet by mouth daily.      . Cholecalciferol (VITAMIN D3) 1000 UNITS CAPS Take 1,000 Units by mouth daily. Take one tab daily      . clopidogrel (PLAVIX) 75 MG tablet Take 75 mg by mouth daily.       Marland Kitchen conjugated estrogens (PREMARIN) vaginal cream Place 1 g vaginally every other day.       . diltiazem (CARDIZEM CD) 120 MG 24 hr capsule Take 120 mg by mouth daily.      . diphenhydrAMINE (BENADRYL) 25 mg capsule Take 25 mg by mouth daily as needed for sleep.       Marland Kitchen docusate sodium (COLACE) 100 MG capsule 100 mg. Take 2 tabs twice a day       . esomeprazole (NEXIUM) 40 MG capsule Take 40 mg by mouth daily before breakfast.       . Ferrous Gluconate (IRON) 240 (27 FE) MG TABS Take 1 tablet by mouth daily.      . folic acid (FOLVITE) 800 MCG tablet Take 800  mcg by mouth daily.      . hyoscyamine (LEVSIN SL) 0.125 MG SL tablet Take 0.125 mg by mouth every 6 (six) hours as needed for cramping or diarrhea or loose stools.       Marland Kitchen levothyroxine (SYNTHROID, LEVOTHROID) 75 MCG tablet Take 75 mcg by mouth daily before breakfast.       . loratadine (CLARITIN) 10 MG tablet Take 10 mg by mouth daily.       Marland Kitchen MELATONIN PO Take 1 tablet by mouth at bedtime.      Marland Kitchen morphine (MS CONTIN) 15 MG 12 hr tablet 15 mg every 8 (eight) hours.       Marland Kitchen morphine (MS CONTIN) 30 MG 12 hr tablet Take 30 mg by mouth every 8 (eight) hours.       Marland Kitchen morphine (MSIR) 15 MG tablet Take 15 mg by mouth every 6 (six) hours as needed for pain.       . Multiple Vitamin (MULTIVITAMIN) tablet Take 1 tablet by mouth daily.       . nitrofurantoin (MACRODANTIN) 100 MG capsule Take 100 mg by mouth at bedtime.       Marland Kitchen  nitroGLYCERIN (NITROSTAT) 0.4 MG SL tablet Place 0.4 mg under the tongue every 5 (five) minutes as needed. Place one tablet under tongue every 5 minutes up to 3 doses as needed for chest pain. No more than 3 doses over a 15 minute period.      . polyethylene glycol powder (GLYCOLAX/MIRALAX) powder Take 17 g by mouth daily as needed (Constipation).       . predniSONE (DELTASONE) 5 MG tablet Take 2.5 mg by mouth every other day.       . raloxifene (EVISTA) 60 MG tablet Take 60 mg by mouth daily.       Marland Kitchen. SPIRIVA HANDIHALER 18 MCG inhalation capsule       . Tamsulosin HCl (FLOMAX) 0.4 MG CAPS Take 0.4 mg by mouth daily. Take 1 capsule by mouth daily.      . temazepam (RESTORIL) 30 MG capsule Take 30 mg by mouth at bedtime.       . tretinoin (RETIN-A) 0.05 % cream Apply 1 application topically at bedtime.       . [DISCONTINUED] diltiazem (CARDIZEM SR) 120 MG 12 hr capsule TAKE ONE CAPSULE BY MOUTH EVERY DAY  30 capsule  5  . [DISCONTINUED] diltiazem (TIAZAC) 120 MG 24 hr capsule Take 1 capsule (120 mg total) by mouth daily.  30 capsule  6   No current facility-administered medications  for this visit.    History   Social History  . Marital Status: Married    Spouse Name: N/A    Number of Children: N/A  . Years of Education: N/A   Occupational History  . Retired    Social History Main Topics  . Smoking status: Former Smoker -- 1.00 packs/day for 20 years    Types: Cigarettes    Quit date: 06/15/2000  . Smokeless tobacco: Never Used  . Alcohol Use: No  . Drug Use: No  . Sexual Activity: Not on file   Other Topics Concern  . Not on file   Social History Narrative   Regular Exercise: Yes-Walk    Family History  Problem Relation Age of Onset  . COPD Other     Past Medical History  Diagnosis Date  . Left ventricular hypertrophy     EF 65%, echo, April, 2011  . CAD (coronary artery disease)     [facility] nuclear scan April, 201 to artifact / persistent shortness of breath / catheterization Oct 21, 2010... widely patent stent to the mid LAD, no significant obstructive disease, should be low risk    for back surgery  . Aortic valve sclerosis     Mild, echo, April, 2012  . MR (mitral regurgitation)      Mild echo, 2012 ///  Mild per Echo 2009,  mild, echo, April, 2011  . Carotid arterial disease     Moderate-Dr. Myra GianottiBrabham following  . GERD (gastroesophageal reflux disease)   . Hypothyroidism   . ARDS (adult respiratory distress syndrome) 2003    with tracheostomy-peritonitis  . UTI (urinary tract infection)     Chronic-on Macrodantin  . Hemolytic anemia     Followed by Dr. Derald Macleodarvosky  . Diastolic dysfunction     Echo, April, 2011   EF 65%  . Shortness of breath     March, 2012  . Palpitations     Holter, September 16, 2010, PACs and PVCs,one 4 beat run of SVT  . Back pain     Need for surgery, Dr. Trey SailorsMark Roy  .  Ejection fraction     EF 65%, echo, September 15, 2010  . Preoperative clearance     Preop clearance for back surgery  . COPD (chronic obstructive pulmonary disease)   . Pneumonia 11-2011  . Myocardial infarction   . Hypertension     . Dysrhythmia     afib  . Arthritis   . Chronic back pain     Past Surgical History  Procedure Laterality Date  . Appendectomy    . Tonsillectomy    . Tracheostomy  2003    Following ARDS   . Thyroid surgery  08/2009    Benign adenomas  . Coronary angioplasty with stent placement  2003  . Repair of ruptured lumbar discs  12-2011  . Breast lumpectomy Left   . Back surgery    . Cataract extraction w/phaco Right 12/05/2012    Procedure: CATARACT EXTRACTION PHACO AND INTRAOCULAR LENS PLACEMENT (IOC);  Surgeon: Gemma Payor, MD;  Location: [facility];  Service: Ophthalmology;  Laterality: Right;  CDE:15.30  . Cataract extraction w/phaco Left 12/15/2012    Procedure: CATARACT EXTRACTION PHACO AND INTRAOCULAR LENS PLACEMENT (IOC);  Surgeon: Gemma Payor, MD;  Location: [facility];  Service: Ophthalmology;  Laterality: Left;  CDE:22.56    Patient Active Problem List   Diagnosis Date Noted  . Occlusion and stenosis of carotid artery without mention of cerebral infarction 03/21/2012  . Preoperative clearance   . CAD (coronary artery disease)   . Palpitations   . Ejection fraction   . MR (mitral regurgitation)   . Left ventricular hypertrophy   . Aortic valve sclerosis   . Carotid arterial disease   . GERD (gastroesophageal reflux disease)   . Hypothyroidism   . ARDS (adult respiratory distress syndrome)   . Hemolytic anemia   . Diastolic dysfunction   . Shortness of breath   . Back pain   . DYSLIPIDEMIA 09/10/2009  . GERD 09/10/2009    ROS   Patient denies fever, chills, headache, sweats, rash, change in vision, change in hearing, chest pain, cough, nausea vomiting, urinary symptoms. All other systems are reviewed and are negative.  PHYSICAL EXAM  Patient is oriented to person time and place. Affect is normal. There is no jugulovenous distention. Lungs are clear. Respiratory effort is nonlabored. Cardiac exam reveals S1 and S2. The rhythm is regular. The abdomen is soft. There is no  peripheral edema. There no musculoskeletal deformities. She does have some mild areas of ecchymoses on the back of her hands.  Filed Vitals:   08/23/13 1318  BP: 114/45  Pulse: 69  Height: 5\' 3"  (1.6 m)  Weight: 127 lb (57.607 kg)  SpO2: 95%   EKG is done today and reviewed by me. There is normal sinus rhythm. The EKG is normal. There is no change from the past.  ASSESSMENT & PLAN

## 2013-08-23 NOTE — Patient Instructions (Signed)
Your physician recommends that you schedule a follow-up appointment in: 1 year. You will receive a reminder letter in the mail in about 10 months reminding you to call and schedule your appointment. If you don't receive this letter, please contact our office. Your physician has recommended you make the following change in your medication:  Stop clopidogrel (plavix). All other medications will remain the same.

## 2013-08-24 DIAGNOSIS — M5137 Other intervertebral disc degeneration, lumbosacral region: Secondary | ICD-10-CM | POA: Diagnosis not present

## 2013-08-24 DIAGNOSIS — M4716 Other spondylosis with myelopathy, lumbar region: Secondary | ICD-10-CM | POA: Diagnosis not present

## 2013-08-24 DIAGNOSIS — IMO0002 Reserved for concepts with insufficient information to code with codable children: Secondary | ICD-10-CM | POA: Diagnosis not present

## 2013-08-24 DIAGNOSIS — M47817 Spondylosis without myelopathy or radiculopathy, lumbosacral region: Secondary | ICD-10-CM | POA: Diagnosis not present

## 2013-09-19 DIAGNOSIS — J019 Acute sinusitis, unspecified: Secondary | ICD-10-CM | POA: Diagnosis not present

## 2013-09-19 DIAGNOSIS — J40 Bronchitis, not specified as acute or chronic: Secondary | ICD-10-CM | POA: Diagnosis not present

## 2013-10-18 DIAGNOSIS — M4716 Other spondylosis with myelopathy, lumbar region: Secondary | ICD-10-CM | POA: Diagnosis not present

## 2013-10-18 DIAGNOSIS — E2839 Other primary ovarian failure: Secondary | ICD-10-CM | POA: Diagnosis not present

## 2013-10-18 DIAGNOSIS — M47817 Spondylosis without myelopathy or radiculopathy, lumbosacral region: Secondary | ICD-10-CM | POA: Diagnosis not present

## 2013-10-19 DIAGNOSIS — Z79899 Other long term (current) drug therapy: Secondary | ICD-10-CM | POA: Diagnosis not present

## 2013-12-11 ENCOUNTER — Other Ambulatory Visit: Payer: Self-pay

## 2013-12-11 MED ORDER — NITROGLYCERIN 0.4 MG SL SUBL: SUBLINGUAL_TABLET | SUBLINGUAL | Status: DC

## 2013-12-14 ENCOUNTER — Other Ambulatory Visit: Payer: Self-pay

## 2013-12-14 MED ORDER — NITROGLYCERIN 0.4 MG SL SUBL: SUBLINGUAL_TABLET | SUBLINGUAL | Status: DC

## 2014-01-31 DIAGNOSIS — M5137 Other intervertebral disc degeneration, lumbosacral region: Secondary | ICD-10-CM | POA: Diagnosis not present

## 2014-01-31 DIAGNOSIS — M4716 Other spondylosis with myelopathy, lumbar region: Secondary | ICD-10-CM | POA: Diagnosis not present

## 2014-01-31 DIAGNOSIS — M47817 Spondylosis without myelopathy or radiculopathy, lumbosacral region: Secondary | ICD-10-CM | POA: Diagnosis not present

## 2014-01-31 DIAGNOSIS — IMO0002 Reserved for concepts with insufficient information to code with codable children: Secondary | ICD-10-CM | POA: Diagnosis not present

## 2014-02-06 DIAGNOSIS — M5126 Other intervertebral disc displacement, lumbar region: Secondary | ICD-10-CM | POA: Diagnosis not present

## 2014-02-06 DIAGNOSIS — M47817 Spondylosis without myelopathy or radiculopathy, lumbosacral region: Secondary | ICD-10-CM | POA: Diagnosis not present

## 2014-02-06 DIAGNOSIS — M5137 Other intervertebral disc degeneration, lumbosacral region: Secondary | ICD-10-CM | POA: Diagnosis not present

## 2014-02-06 DIAGNOSIS — M48061 Spinal stenosis, lumbar region without neurogenic claudication: Secondary | ICD-10-CM | POA: Diagnosis not present

## 2014-03-15 DIAGNOSIS — E039 Hypothyroidism, unspecified: Secondary | ICD-10-CM | POA: Diagnosis not present

## 2014-03-15 DIAGNOSIS — E782 Mixed hyperlipidemia: Secondary | ICD-10-CM | POA: Diagnosis not present

## 2014-03-15 DIAGNOSIS — J449 Chronic obstructive pulmonary disease, unspecified: Secondary | ICD-10-CM | POA: Diagnosis not present

## 2014-03-21 DIAGNOSIS — M5136 Other intervertebral disc degeneration, lumbar region: Secondary | ICD-10-CM | POA: Diagnosis not present

## 2014-03-21 DIAGNOSIS — M47812 Spondylosis without myelopathy or radiculopathy, cervical region: Secondary | ICD-10-CM | POA: Diagnosis not present

## 2014-03-21 DIAGNOSIS — M47816 Spondylosis without myelopathy or radiculopathy, lumbar region: Secondary | ICD-10-CM | POA: Diagnosis not present

## 2014-03-21 DIAGNOSIS — M4806 Spinal stenosis, lumbar region: Secondary | ICD-10-CM | POA: Diagnosis not present

## 2014-03-22 DIAGNOSIS — M810 Age-related osteoporosis without current pathological fracture: Secondary | ICD-10-CM | POA: Diagnosis not present

## 2014-03-22 DIAGNOSIS — M549 Dorsalgia, unspecified: Secondary | ICD-10-CM | POA: Diagnosis not present

## 2014-03-22 DIAGNOSIS — D649 Anemia, unspecified: Secondary | ICD-10-CM | POA: Diagnosis not present

## 2014-03-22 DIAGNOSIS — M85851 Other specified disorders of bone density and structure, right thigh: Secondary | ICD-10-CM | POA: Diagnosis not present

## 2014-03-22 DIAGNOSIS — Z8262 Family history of osteoporosis: Secondary | ICD-10-CM | POA: Diagnosis not present

## 2014-03-22 DIAGNOSIS — M85852 Other specified disorders of bone density and structure, left thigh: Secondary | ICD-10-CM | POA: Diagnosis not present

## 2014-03-22 DIAGNOSIS — Z780 Asymptomatic menopausal state: Secondary | ICD-10-CM | POA: Diagnosis not present

## 2014-03-28 DIAGNOSIS — Z23 Encounter for immunization: Secondary | ICD-10-CM | POA: Diagnosis not present

## 2014-04-05 DIAGNOSIS — M419 Scoliosis, unspecified: Secondary | ICD-10-CM | POA: Diagnosis not present

## 2014-04-05 DIAGNOSIS — M47816 Spondylosis without myelopathy or radiculopathy, lumbar region: Secondary | ICD-10-CM | POA: Diagnosis not present

## 2014-04-05 DIAGNOSIS — M4716 Other spondylosis with myelopathy, lumbar region: Secondary | ICD-10-CM | POA: Diagnosis not present

## 2014-04-09 ENCOUNTER — Other Ambulatory Visit: Payer: Self-pay | Admitting: *Deleted

## 2014-04-09 MED ORDER — DILTIAZEM HCL ER COATED BEADS 120 MG PO CP24: 120.0000 mg | ORAL_CAPSULE | Freq: Every day | ORAL | Status: DC

## 2014-05-01 DIAGNOSIS — E042 Nontoxic multinodular goiter: Secondary | ICD-10-CM | POA: Insufficient documentation

## 2014-05-01 DIAGNOSIS — E890 Postprocedural hypothyroidism: Secondary | ICD-10-CM | POA: Diagnosis not present

## 2014-05-31 DIAGNOSIS — D696 Thrombocytopenia, unspecified: Secondary | ICD-10-CM | POA: Diagnosis not present

## 2014-05-31 DIAGNOSIS — D599 Acquired hemolytic anemia, unspecified: Secondary | ICD-10-CM | POA: Diagnosis not present

## 2014-05-31 DIAGNOSIS — Z7952 Long term (current) use of systemic steroids: Secondary | ICD-10-CM | POA: Diagnosis not present

## 2014-05-31 DIAGNOSIS — M810 Age-related osteoporosis without current pathological fracture: Secondary | ICD-10-CM | POA: Diagnosis not present

## 2014-06-25 DIAGNOSIS — H524 Presbyopia: Secondary | ICD-10-CM | POA: Diagnosis not present

## 2014-06-25 DIAGNOSIS — H1851 Endothelial corneal dystrophy: Secondary | ICD-10-CM | POA: Diagnosis not present

## 2014-06-25 DIAGNOSIS — Z961 Presence of intraocular lens: Secondary | ICD-10-CM | POA: Diagnosis not present

## 2014-06-25 DIAGNOSIS — H04123 Dry eye syndrome of bilateral lacrimal glands: Secondary | ICD-10-CM | POA: Diagnosis not present

## 2014-06-27 DIAGNOSIS — M4716 Other spondylosis with myelopathy, lumbar region: Secondary | ICD-10-CM | POA: Diagnosis not present

## 2014-06-27 DIAGNOSIS — M4806 Spinal stenosis, lumbar region: Secondary | ICD-10-CM | POA: Diagnosis not present

## 2014-06-27 DIAGNOSIS — M47816 Spondylosis without myelopathy or radiculopathy, lumbar region: Secondary | ICD-10-CM | POA: Diagnosis not present

## 2014-07-05 DIAGNOSIS — Z7952 Long term (current) use of systemic steroids: Secondary | ICD-10-CM | POA: Diagnosis not present

## 2014-07-05 DIAGNOSIS — D599 Acquired hemolytic anemia, unspecified: Secondary | ICD-10-CM | POA: Diagnosis not present

## 2014-07-05 DIAGNOSIS — D849 Immunodeficiency, unspecified: Secondary | ICD-10-CM | POA: Diagnosis not present

## 2014-07-18 DIAGNOSIS — R609 Edema, unspecified: Secondary | ICD-10-CM | POA: Diagnosis not present

## 2014-07-18 DIAGNOSIS — N3001 Acute cystitis with hematuria: Secondary | ICD-10-CM | POA: Diagnosis not present

## 2014-07-25 DIAGNOSIS — M810 Age-related osteoporosis without current pathological fracture: Secondary | ICD-10-CM | POA: Diagnosis not present

## 2014-07-26 DIAGNOSIS — I509 Heart failure, unspecified: Secondary | ICD-10-CM | POA: Diagnosis not present

## 2014-07-26 DIAGNOSIS — R609 Edema, unspecified: Secondary | ICD-10-CM | POA: Diagnosis not present

## 2014-08-06 DIAGNOSIS — I10 Essential (primary) hypertension: Secondary | ICD-10-CM | POA: Diagnosis not present

## 2014-08-06 DIAGNOSIS — R931 Abnormal findings on diagnostic imaging of heart and coronary circulation: Secondary | ICD-10-CM | POA: Diagnosis not present

## 2014-08-06 DIAGNOSIS — R609 Edema, unspecified: Secondary | ICD-10-CM | POA: Diagnosis not present

## 2014-08-16 DIAGNOSIS — M4806 Spinal stenosis, lumbar region: Secondary | ICD-10-CM | POA: Diagnosis not present

## 2014-08-16 DIAGNOSIS — M47812 Spondylosis without myelopathy or radiculopathy, cervical region: Secondary | ICD-10-CM | POA: Diagnosis not present

## 2014-08-16 DIAGNOSIS — M5136 Other intervertebral disc degeneration, lumbar region: Secondary | ICD-10-CM | POA: Diagnosis not present

## 2014-08-16 DIAGNOSIS — M47816 Spondylosis without myelopathy or radiculopathy, lumbar region: Secondary | ICD-10-CM | POA: Diagnosis not present

## 2014-08-27 ENCOUNTER — Encounter: Payer: Self-pay | Admitting: Cardiology

## 2014-08-27 ENCOUNTER — Ambulatory Visit (INDEPENDENT_AMBULATORY_CARE_PROVIDER_SITE_OTHER): Payer: Medicare Other | Admitting: Cardiology

## 2014-08-27 VITALS — BP 112/54 | HR 62 | Ht 64.0 in | Wt 124.0 lb

## 2014-08-27 DIAGNOSIS — I519 Heart disease, unspecified: Secondary | ICD-10-CM

## 2014-08-27 DIAGNOSIS — I358 Other nonrheumatic aortic valve disorders: Secondary | ICD-10-CM | POA: Diagnosis not present

## 2014-08-27 DIAGNOSIS — I517 Cardiomegaly: Secondary | ICD-10-CM

## 2014-08-27 DIAGNOSIS — E785 Hyperlipidemia, unspecified: Secondary | ICD-10-CM

## 2014-08-27 DIAGNOSIS — I5189 Other ill-defined heart diseases: Secondary | ICD-10-CM

## 2014-08-27 DIAGNOSIS — R0989 Other specified symptoms and signs involving the circulatory and respiratory systems: Secondary | ICD-10-CM | POA: Diagnosis not present

## 2014-08-27 DIAGNOSIS — I739 Peripheral vascular disease, unspecified: Secondary | ICD-10-CM

## 2014-08-27 DIAGNOSIS — I779 Disorder of arteries and arterioles, unspecified: Secondary | ICD-10-CM | POA: Diagnosis not present

## 2014-08-27 DIAGNOSIS — R9430 Abnormal result of cardiovascular function study, unspecified: Secondary | ICD-10-CM

## 2014-08-27 DIAGNOSIS — R002 Palpitations: Secondary | ICD-10-CM

## 2014-08-27 DIAGNOSIS — I2510 Atherosclerotic heart disease of native coronary artery without angina pectoris: Secondary | ICD-10-CM | POA: Diagnosis not present

## 2014-08-27 DIAGNOSIS — I340 Nonrheumatic mitral (valve) insufficiency: Secondary | ICD-10-CM | POA: Diagnosis not present

## 2014-08-27 NOTE — Assessment & Plan Note (Signed)
Aortic valve disease and mitral valve disease are mild. This is shown to be stable by echo in February, 2016.

## 2014-08-27 NOTE — Patient Instructions (Signed)

## 2014-08-27 NOTE — Assessment & Plan Note (Signed)
Patient is on a small dose of atorvastatin. If she can tolerate it, a higher dose would be recommended.

## 2014-08-27 NOTE — Assessment & Plan Note (Signed)
Coronary disease is stable. Her last cath was 2012. She does not need any follow-up studies at this time.

## 2014-08-27 NOTE — Assessment & Plan Note (Signed)
She is not having any significant palpitations. No further workup needed

## 2014-08-27 NOTE — Assessment & Plan Note (Signed)
Her carotid disease is stable. Plans were made for her to have a follow-up two-year study with vascular surgeons. This will be done later this year.

## 2014-08-27 NOTE — Progress Notes (Signed)
Cardiology Office Note   Date:  08/27/2014   ID:  Mckenzie [name], DOB 1935-08-19, MRN [id number]  PCP:  Macky Lower  Cardiologist:  Willa Rough, MD   Chief Complaint  Patient presents with  . Appointment    Follow-up coronary artery disease      History of Present Illness: Mckenzie [name] is a 79 y.o. female who presents today to follow-up coronary artery disease. I saw her last in March, 2015. She is not having any chest pain. She has a stent in the past. This was restudied by catheterization in 2012 in the LAD was widely patent. She has good left ventricular function by history. Recently there was some concern in her primary care office about edema. She had follow-up 2-D echo that was done at [facility]. The results are scanned into our system. The ejection fraction was 60%. Her aortic and mitral valvular disease review pains quite mild. Her edema has improved.    Past Medical History  Diagnosis Date  . Left ventricular hypertrophy     EF 65%, echo, April, 2011  . CAD (coronary artery disease)     Stent to LAD, 2003 [facility] / nuclear scan April, 201 to artifact / persistent shortness of breath / catheterization Oct 21, 2010... widely patent stent to the mid LAD, no significant obstructive disease, should be low risk    for back surgery  . Aortic valve sclerosis     Mild, echo, April, 2012  . MR (mitral regurgitation)      Mild echo, 2012 ///  Mild per Echo 2009,  mild, echo, April, 2011  . Carotid arterial disease     Moderate-Dr. Myra Gianotti following  . GERD (gastroesophageal reflux disease)   . Hypothyroidism   . ARDS (adult respiratory distress syndrome) 2003    with tracheostomy-peritonitis  . UTI (urinary tract infection)     Chronic-on Macrodantin  . Hemolytic anemia     Followed by Dr. Derald Macleod  . Diastolic dysfunction     Echo, April, 2011   EF 65%  . Shortness of breath     March, 2012  . Palpitations     Holter, September 16, 2010, PACs and  PVCs,one 4 beat run of SVT  . Back pain     Need for surgery, Dr. Trey Sailors  . Ejection fraction     EF 65%, echo, September 15, 2010  . Preoperative clearance     Preop clearance for back surgery  . COPD (chronic obstructive pulmonary disease)   . Pneumonia 11-2011  . Myocardial infarction   . Hypertension   . Dysrhythmia     afib  . Arthritis   . Chronic back pain     Past Surgical History  Procedure Laterality Date  . Appendectomy    . Tonsillectomy    . Tracheostomy  2003    Following ARDS   . Thyroid surgery  08/2009    Benign adenomas  . Coronary angioplasty with stent placement  2003  . Repair of ruptured lumbar discs  12-2011  . Breast lumpectomy Left   . Back surgery    . Cataract extraction w/phaco Right 12/05/2012    Procedure: CATARACT EXTRACTION PHACO AND INTRAOCULAR LENS PLACEMENT (IOC);  Surgeon: Gemma Payor, MD;  Location: [facility];  Service: Ophthalmology;  Laterality: Right;  CDE:15.30  . Cataract extraction w/phaco Left 12/15/2012    Procedure: CATARACT EXTRACTION PHACO AND INTRAOCULAR LENS PLACEMENT (IOC);  Surgeon: Gemma Payor, MD;  Location: [facility];  Service: Ophthalmology;  Laterality: Left;  CDE:22.56    Patient Active Problem List   Diagnosis Date Noted  . Easy bruisability 08/23/2013  . Occlusion and stenosis of carotid artery without mention of cerebral infarction 03/21/2012  . Preoperative clearance   . CAD (coronary artery disease)   . Palpitations   . Ejection fraction   . MR (mitral regurgitation)   . Left ventricular hypertrophy   . Aortic valve sclerosis   . Carotid arterial disease   . GERD (gastroesophageal reflux disease)   . Hypothyroidism   . ARDS (adult respiratory distress syndrome)   . Hemolytic anemia   . Diastolic dysfunction   . Shortness of breath   . Back pain   . DYSLIPIDEMIA 09/10/2009  . GERD 09/10/2009      Current Outpatient Prescriptions  Medication Sig Dispense Refill  . albuterol (PROVENTIL HFA;VENTOLIN HFA) 108 (90  BASE) MCG/ACT inhaler Inhale 2 puffs into the lungs every 6 (six) hours as needed for wheezing.    Marland Kitchen. alendronate (FOSAMAX) 70 MG tablet Take 70 mg by mouth every 7 (seven) days. Tuesday    . atorvastatin (LIPITOR) 10 MG tablet Take 10 mg by mouth daily.     . benazepril (LOTENSIN) 5 MG tablet Take 5 mg by mouth daily.     . bethanechol (URECHOLINE) 25 MG tablet Take 25 mg by mouth 4 (four) times daily.    . calcium-vitamin D (OSCAL WITH D) 500-200 MG-UNIT per tablet Take 1 tablet by mouth daily.    . Cholecalciferol (VITAMIN D3) 1000 UNITS CAPS Take 1,000 Units by mouth daily. Take one tab daily    . conjugated estrogens (PREMARIN) vaginal cream Place 1 g vaginally every other day.     . diltiazem (CARDIZEM CD) 120 MG 24 hr capsule Take 1 capsule (120 mg total) by mouth daily. 30 capsule 6  . diphenhydrAMINE (BENADRYL) 25 mg capsule Take 25 mg by mouth daily as needed for sleep.     Marland Kitchen. docusate sodium (COLACE) 100 MG capsule 100 mg. Take 2 tabs twice a day     . esomeprazole (NEXIUM) 40 MG capsule Take 40 mg by mouth daily before breakfast.     . Ferrous Gluconate (IRON) 240 (27 FE) MG TABS Take 1 tablet by mouth daily.    . folic acid (FOLVITE) 800 MCG tablet Take 800 mcg by mouth daily.    . hyoscyamine (LEVSIN SL) 0.125 MG SL tablet Take 0.125 mg by mouth every 6 (six) hours as needed for cramping or diarrhea or loose stools.     Marland Kitchen. levothyroxine (SYNTHROID, LEVOTHROID) 75 MCG tablet Take 75 mcg by mouth daily before breakfast.     . MELATONIN PO Take 1 tablet by mouth at bedtime.    Marland Kitchen. morphine (MS CONTIN) 15 MG 12 hr tablet 15 mg every 8 (eight) hours.     Marland Kitchen. morphine (MS CONTIN) 30 MG 12 hr tablet Take 30 mg by mouth every 8 (eight) hours.     Marland Kitchen. morphine (MSIR) 15 MG tablet Take 15 mg by mouth every 6 (six) hours as needed for pain.     . Multiple Vitamin (MULTIVITAMIN) tablet Take 1 tablet by mouth daily.     . nitrofurantoin (MACRODANTIN) 100 MG capsule Take 100 mg by mouth at bedtime.       . nitroGLYCERIN (NITROSTAT) 0.4 MG SL tablet Place one tablet under tongue every 5 minutes up to 3 doses as needed for chest pain. No more than 3 doses  over a 15 minute period. 100 tablet 0  . polyethylene glycol powder (GLYCOLAX/MIRALAX) powder Take 17 g by mouth daily as needed (Constipation).     . predniSONE (DELTASONE) 5 MG tablet Take by mouth every other day. 1/4 tab daily    . raloxifene (EVISTA) 60 MG tablet Take 60 mg by mouth daily.     Marland Kitchen SPIRIVA HANDIHALER 18 MCG inhalation capsule     . Tamsulosin HCl (FLOMAX) 0.4 MG CAPS Take 0.4 mg by mouth daily. Take 1 capsule by mouth daily.    . temazepam (RESTORIL) 30 MG capsule Take 30 mg by mouth at bedtime.     . tretinoin (RETIN-A) 0.05 % cream Apply 1 application topically at bedtime.     . [DISCONTINUED] diltiazem (CARDIZEM SR) 120 MG 12 hr capsule TAKE ONE CAPSULE BY MOUTH EVERY DAY 30 capsule 5  . [DISCONTINUED] diltiazem (TIAZAC) 120 MG 24 hr capsule Take 1 capsule (120 mg total) by mouth daily. 30 capsule 6   No current facility-administered medications for this visit.    Allergies:   Sulfamethoxazole-trimethoprim and Tetracycline    Social History:  The patient  reports that she quit smoking about 14 years ago. Her smoking use included Cigarettes. She has a 20 pack-year smoking history. She has never used smokeless tobacco. She reports that she does not drink alcohol or use illicit drugs.   Family History:  The patient's family history includes COPD in her other.    ROS:  Please see the history of present illness.     Patient denies fever, chills, headache, sweats, rash, change in vision, change in hearing, chest pain, cough, nausea vomiting, urinary symptoms. She does have some discomfort in her left arm. This occurs at night time and it is positional. It does not sound like angina. All other systems are reviewed and are negative.   PHYSICAL EXAM: VS:  BP 112/54 mmHg  Pulse 62  Ht  (1.626 m)  Wt 124 lb (56.246 kg)   BMI 21.27 kg/m2  SpO2 96% , Patient is stable today. She is oriented to person time and place. Affect is normal. Head is atraumatic. Sclera and conjunctiva are normal. There is no jugular venous distention. Lungs are clear. Respiratory effort is not labored. Cardiac exam reveals S1 and S2. The abdomen is soft. There is no peripheral edema. There are no musculoskeletal deformities. There are no skin rashes.  EKG:   EKG is done today. There is sinus rhythm. No significant abnormalities. There is no significant change from the past.  Recent Labs: No results found for requested labs within last 365 days.    Lipid Panel No results found for: CHOL, TRIG, HDL, CHOLHDL, VLDL, LDLCALC, LDLDIRECT    Wt Readings from Last 3 Encounters:  08/27/14 124 lb (56.246 kg)  08/23/13 127 lb (57.607 kg)  03/27/13 125 lb 4.8 oz (56.836 kg)      Current medicines are reviewed       ASSESSMENT AND PLAN:

## 2014-09-05 DIAGNOSIS — N3946 Mixed incontinence: Secondary | ICD-10-CM | POA: Diagnosis not present

## 2014-09-05 DIAGNOSIS — N3020 Other chronic cystitis without hematuria: Secondary | ICD-10-CM | POA: Diagnosis not present

## 2014-09-05 DIAGNOSIS — R339 Retention of urine, unspecified: Secondary | ICD-10-CM | POA: Diagnosis not present

## 2014-09-05 DIAGNOSIS — N952 Postmenopausal atrophic vaginitis: Secondary | ICD-10-CM | POA: Diagnosis not present

## 2014-09-26 DIAGNOSIS — E782 Mixed hyperlipidemia: Secondary | ICD-10-CM | POA: Diagnosis not present

## 2014-09-26 DIAGNOSIS — M545 Low back pain: Secondary | ICD-10-CM | POA: Diagnosis not present

## 2014-09-26 DIAGNOSIS — G4700 Insomnia, unspecified: Secondary | ICD-10-CM | POA: Diagnosis not present

## 2014-09-26 DIAGNOSIS — E039 Hypothyroidism, unspecified: Secondary | ICD-10-CM | POA: Diagnosis not present

## 2014-09-26 DIAGNOSIS — I10 Essential (primary) hypertension: Secondary | ICD-10-CM | POA: Diagnosis not present

## 2014-09-26 DIAGNOSIS — I358 Other nonrheumatic aortic valve disorders: Secondary | ICD-10-CM | POA: Diagnosis not present

## 2014-09-26 DIAGNOSIS — Z1389 Encounter for screening for other disorder: Secondary | ICD-10-CM | POA: Diagnosis not present

## 2014-09-26 DIAGNOSIS — J449 Chronic obstructive pulmonary disease, unspecified: Secondary | ICD-10-CM | POA: Diagnosis not present

## 2014-10-17 DIAGNOSIS — M5416 Radiculopathy, lumbar region: Secondary | ICD-10-CM | POA: Diagnosis not present

## 2014-10-17 DIAGNOSIS — M47816 Spondylosis without myelopathy or radiculopathy, lumbar region: Secondary | ICD-10-CM | POA: Diagnosis not present

## 2014-10-17 DIAGNOSIS — M4302 Spondylolysis, cervical region: Secondary | ICD-10-CM | POA: Diagnosis not present

## 2014-11-08 ENCOUNTER — Other Ambulatory Visit: Payer: Self-pay | Admitting: *Deleted

## 2014-11-08 MED ORDER — DILTIAZEM HCL ER COATED BEADS 120 MG PO CP24: 120.0000 mg | ORAL_CAPSULE | Freq: Every day | ORAL | Status: DC

## 2014-11-15 DIAGNOSIS — M4806 Spinal stenosis, lumbar region: Secondary | ICD-10-CM | POA: Diagnosis not present

## 2014-11-15 DIAGNOSIS — Z79891 Long term (current) use of opiate analgesic: Secondary | ICD-10-CM | POA: Diagnosis not present

## 2014-11-15 DIAGNOSIS — M5136 Other intervertebral disc degeneration, lumbar region: Secondary | ICD-10-CM | POA: Diagnosis not present

## 2014-11-15 DIAGNOSIS — M47816 Spondylosis without myelopathy or radiculopathy, lumbar region: Secondary | ICD-10-CM | POA: Diagnosis not present

## 2014-12-18 DIAGNOSIS — M7021 Olecranon bursitis, right elbow: Secondary | ICD-10-CM | POA: Diagnosis not present

## 2014-12-18 DIAGNOSIS — J209 Acute bronchitis, unspecified: Secondary | ICD-10-CM | POA: Diagnosis not present

## 2014-12-19 DIAGNOSIS — M7021 Olecranon bursitis, right elbow: Secondary | ICD-10-CM | POA: Diagnosis not present

## 2015-01-17 ENCOUNTER — Other Ambulatory Visit: Payer: Self-pay | Admitting: Cardiology

## 2015-01-17 DIAGNOSIS — J449 Chronic obstructive pulmonary disease, unspecified: Secondary | ICD-10-CM | POA: Diagnosis not present

## 2015-01-17 MED ORDER — DILTIAZEM HCL ER COATED BEADS 120 MG PO CP24: 120.0000 mg | ORAL_CAPSULE | Freq: Every day | ORAL | Status: DC

## 2015-01-17 NOTE — Telephone Encounter (Signed)
Needs refill on diltiazem (CARDIZEM CD) 120 MG 24 hr capsule  Wants RX called to [facility]

## 2015-01-17 NOTE — Telephone Encounter (Signed)
Done

## 2015-01-29 DIAGNOSIS — J449 Chronic obstructive pulmonary disease, unspecified: Secondary | ICD-10-CM | POA: Diagnosis not present

## 2015-01-30 DIAGNOSIS — M47816 Spondylosis without myelopathy or radiculopathy, lumbar region: Secondary | ICD-10-CM | POA: Diagnosis not present

## 2015-01-30 DIAGNOSIS — M5416 Radiculopathy, lumbar region: Secondary | ICD-10-CM | POA: Diagnosis not present

## 2015-02-20 DIAGNOSIS — M4806 Spinal stenosis, lumbar region: Secondary | ICD-10-CM | POA: Diagnosis not present

## 2015-02-20 DIAGNOSIS — M5136 Other intervertebral disc degeneration, lumbar region: Secondary | ICD-10-CM | POA: Diagnosis not present

## 2015-02-20 DIAGNOSIS — M47816 Spondylosis without myelopathy or radiculopathy, lumbar region: Secondary | ICD-10-CM | POA: Diagnosis not present

## 2015-02-20 DIAGNOSIS — M47812 Spondylosis without myelopathy or radiculopathy, cervical region: Secondary | ICD-10-CM | POA: Diagnosis not present

## 2015-03-20 DIAGNOSIS — E782 Mixed hyperlipidemia: Secondary | ICD-10-CM | POA: Diagnosis not present

## 2015-03-20 DIAGNOSIS — I10 Essential (primary) hypertension: Secondary | ICD-10-CM | POA: Diagnosis not present

## 2015-03-20 DIAGNOSIS — I358 Other nonrheumatic aortic valve disorders: Secondary | ICD-10-CM | POA: Diagnosis not present

## 2015-03-20 DIAGNOSIS — J449 Chronic obstructive pulmonary disease, unspecified: Secondary | ICD-10-CM | POA: Diagnosis not present

## 2015-03-20 DIAGNOSIS — E039 Hypothyroidism, unspecified: Secondary | ICD-10-CM | POA: Diagnosis not present

## 2015-03-27 DIAGNOSIS — I358 Other nonrheumatic aortic valve disorders: Secondary | ICD-10-CM | POA: Diagnosis not present

## 2015-03-27 DIAGNOSIS — E782 Mixed hyperlipidemia: Secondary | ICD-10-CM | POA: Diagnosis not present

## 2015-03-27 DIAGNOSIS — E039 Hypothyroidism, unspecified: Secondary | ICD-10-CM | POA: Diagnosis not present

## 2015-03-27 DIAGNOSIS — I10 Essential (primary) hypertension: Secondary | ICD-10-CM | POA: Diagnosis not present

## 2015-03-27 DIAGNOSIS — G4700 Insomnia, unspecified: Secondary | ICD-10-CM | POA: Diagnosis not present

## 2015-03-27 DIAGNOSIS — J449 Chronic obstructive pulmonary disease, unspecified: Secondary | ICD-10-CM | POA: Diagnosis not present

## 2015-03-27 DIAGNOSIS — M545 Low back pain: Secondary | ICD-10-CM | POA: Diagnosis not present

## 2015-03-28 ENCOUNTER — Encounter: Payer: Self-pay | Admitting: Family

## 2015-04-01 ENCOUNTER — Ambulatory Visit (HOSPITAL_COMMUNITY)
Admission: RE | Admit: 2015-04-01 | Discharge: 2015-04-01 | Disposition: A | Discharge status: Home / Self Care | Payer: Medicare Other | Source: Ambulatory Visit | Attending: Family | Admitting: Family

## 2015-04-01 ENCOUNTER — Ambulatory Visit (INDEPENDENT_AMBULATORY_CARE_PROVIDER_SITE_OTHER): Payer: Medicare Other | Admitting: Family

## 2015-04-01 ENCOUNTER — Encounter: Payer: Self-pay | Admitting: Family

## 2015-04-01 VITALS — BP 125/62 | HR 68 | Temp 98.0°F | Resp 14 | Ht 62.0 in | Wt 129.0 lb

## 2015-04-01 DIAGNOSIS — I6523 Occlusion and stenosis of bilateral carotid arteries: Secondary | ICD-10-CM

## 2015-04-01 DIAGNOSIS — I10 Essential (primary) hypertension: Secondary | ICD-10-CM | POA: Insufficient documentation

## 2015-04-01 DIAGNOSIS — I2510 Atherosclerotic heart disease of native coronary artery without angina pectoris: Secondary | ICD-10-CM | POA: Diagnosis not present

## 2015-04-01 NOTE — Addendum Note (Signed)
Addended by: Adria DillELDRIDGE-LEWIS, Caterine Mcmeans L on: 04/01/2015 06:05 PM   Modules accepted: Orders

## 2015-04-01 NOTE — Patient Instructions (Signed)
Stroke Prevention Some medical conditions and behaviors are associated with an increased chance of having a stroke. You may prevent a stroke by making healthy choices and managing medical conditions. HOW CAN I REDUCE MY RISK OF HAVING A STROKE?   Stay physically active. Get at least 30 minutes of activity on most or all days.  Do not smoke. It may also be helpful to avoid exposure to secondhand smoke.  Limit alcohol use. Moderate alcohol use is considered to be:  No more than 2 drinks per day for men.  No more than 1 drink per day for nonpregnant women.  Eat healthy foods. This involves:  Eating 5 or more servings of fruits and vegetables a day.  Making dietary changes that address high blood pressure (hypertension), high cholesterol, diabetes, or obesity.  Manage your cholesterol levels.  Making food choices that are high in fiber and low in saturated fat, trans fat, and cholesterol may control cholesterol levels.  Take any prescribed medicines to control cholesterol as directed by your health care provider.  Manage your diabetes.  Controlling your carbohydrate and sugar intake is recommended to manage diabetes.  Take any prescribed medicines to control diabetes as directed by your health care provider.  Control your hypertension.  Making food choices that are low in salt (sodium), saturated fat, trans fat, and cholesterol is recommended to manage hypertension.  Ask your health care provider if you need treatment to lower your blood pressure. Take any prescribed medicines to control hypertension as directed by your health care provider.  If you are 18-39 years of age, have your blood pressure checked every 3-5 years. If you are 40 years of age or older, have your blood pressure checked every year.  Maintain a healthy weight.  Reducing calorie intake and making food choices that are low in sodium, saturated fat, trans fat, and cholesterol are recommended to manage  weight.  Stop drug abuse.  Avoid taking birth control pills.  Talk to your health care provider about the risks of taking birth control pills if you are over 35 years old, smoke, get migraines, or have ever had a blood clot.  Get evaluated for sleep disorders (sleep apnea).  Talk to your health care provider about getting a sleep evaluation if you snore a lot or have excessive sleepiness.  Take medicines only as directed by your health care provider.  For some people, aspirin or blood thinners (anticoagulants) are helpful in reducing the risk of forming abnormal blood clots that can lead to stroke. If you have the irregular heart rhythm of atrial fibrillation, you should be on a blood thinner unless there is a good reason you cannot take them.  Understand all your medicine instructions.  Make sure that other conditions (such as anemia or atherosclerosis) are addressed. SEEK IMMEDIATE MEDICAL CARE IF:   You have sudden weakness or numbness of the face, arm, or leg, especially on one side of the body.  Your face or eyelid droops to one side.  You have sudden confusion.  You have trouble speaking (aphasia) or understanding.  You have sudden trouble seeing in one or both eyes.  You have sudden trouble walking.  You have dizziness.  You have a loss of balance or coordination.  You have a sudden, severe headache with no known cause.  You have new chest pain or an irregular heartbeat. Any of these symptoms may represent a serious problem that is an emergency. Do not wait to see if the symptoms will   go away. Get medical help at once. Call your local emergency services (911 in [place] Do not drive yourself to the hospital.   This information is not intended to replace advice given to you by your health care provider. Make sure you discuss any questions you have with your health care provider.   Document Released: 07/09/2004 Document Revised: 06/22/2014 Document Reviewed:  12/02/2012 Elsevier Interactive Patient Education 2016 [employer]  

## 2015-04-01 NOTE — Progress Notes (Signed)
Established Carotid Patient   History of Present Illness  Mckenzie [name] is a 79 y.o. female patient of Dr. Myra Gianotti who has known carotid artery stenosis. This was originally detected by auscultation of a carotid bruit. At that time duplex revealed 50-69% stenosis in both carotid arteries. This was in 2010. She has been asymptomatic the entire time. Had surgery for 4 lumbar HNP's and has severe left sciatic pain. Patient also reports severe osteoporosis in her spine, is under treatment for this.  She has hemolytic anemia and needs to take prednisone qod for this which worsens osteoporosis.  Her endocrinologist is at [facility] now. Dr. Remonia Richter is her hematologist in [place].  Pt had an MI in 2002 during hospitalization for ruptured appendix. She had cardiac stent placed in 2003. She has a known aortic valve murmur, her cardiologist is Dr. Myrtis Ser. Patient denies ever having a stroke or TIA. Specifically the patient denies a history of amaurosis fugax or monocular blindness, unilateral facial drooping, hemiplegia, or receptive or expressive aphasia.   Patient reports New Medical or Surgical History: lumbar spine surgery in 2015.  Pt Diabetic: No Pt smoker: former smoker, quit 2002  Pt meds include: Statin : Yes ASA: yes Other anticoagulants/antiplatelets: no    Past Medical History  Diagnosis Date  . Left ventricular hypertrophy     EF 65%, echo, April, 2011  . CAD (coronary artery disease)     Stent to LAD, 2003 [facility] / nuclear scan April, 201 to artifact / persistent shortness of breath / catheterization Oct 21, 2010... widely patent stent to the mid LAD, no significant obstructive disease, should be low risk    for back surgery  . Aortic valve sclerosis     Mild, echo, April, 2012  . MR (mitral regurgitation)      Mild echo, 2012 ///  Mild per Echo 2009,  mild, echo, April, 2011  . Carotid arterial disease     Moderate-Dr. Myra Gianotti following  . GERD (gastroesophageal reflux  disease)   . Hypothyroidism   . ARDS (adult respiratory distress syndrome) 2003    with tracheostomy-peritonitis  . UTI (urinary tract infection)     Chronic-on Macrodantin  . Hemolytic anemia     Followed by Dr. Derald Macleod  . Diastolic dysfunction     Echo, April, 2011   EF 65%  . Shortness of breath     March, 2012  . Palpitations     Holter, September 16, 2010, PACs and PVCs,one 4 beat run of SVT  . Back pain     Need for surgery, Dr. Trey Sailors  . Ejection fraction     EF 65%, echo, September 15, 2010  . Preoperative clearance     Preop clearance for back surgery  . COPD (chronic obstructive pulmonary disease)   . Pneumonia 11-2011  . Myocardial infarction   . Hypertension   . Dysrhythmia     afib  . Arthritis   . Chronic back pain     Social History Social History  Substance Use Topics  . Smoking status: Former Smoker -- 1.00 packs/day for 20 years    Types: Cigarettes    Quit date: 06/15/2000  . Smokeless tobacco: Never Used  . Alcohol Use: No    Family History Family History  Problem Relation Age of Onset  . COPD Other     Surgical History Past Surgical History  Procedure Laterality Date  . Appendectomy    . Tonsillectomy    . Tracheostomy  2003  Following ARDS   . Thyroid surgery  08/2009    Benign adenomas  . Coronary angioplasty with stent placement  2003  . Repair of ruptured lumbar discs  12-2011  . Breast lumpectomy Left   . Back surgery    . Cataract extraction w/phaco Right 12/05/2012    Procedure: CATARACT EXTRACTION PHACO AND INTRAOCULAR LENS PLACEMENT (IOC);  Surgeon: Gemma Payor, MD;  Location: [facility];  Service: Ophthalmology;  Laterality: Right;  CDE:15.30  . Cataract extraction w/phaco Left 12/15/2012    Procedure: CATARACT EXTRACTION PHACO AND INTRAOCULAR LENS PLACEMENT (IOC);  Surgeon: Gemma Payor, MD;  Location: [facility];  Service: Ophthalmology;  Laterality: Left;  CDE:22.56    Allergies  Allergen Reactions  . Sulfamethoxazole-Trimethoprim  Nausea Only  . Tetracycline Nausea Only    Current Outpatient Prescriptions  Medication Sig Dispense Refill  . albuterol (PROVENTIL HFA;VENTOLIN HFA) 108 (90 BASE) MCG/ACT inhaler Inhale 2 puffs into the lungs every 6 (six) hours as needed for wheezing.    Marland Kitchen alendronate (FOSAMAX) 70 MG tablet Take 70 mg by mouth every 7 (seven) days. Tuesday    . atorvastatin (LIPITOR) 10 MG tablet Take 10 mg by mouth daily.     . benazepril (LOTENSIN) 5 MG tablet Take 5 mg by mouth daily.     . bethanechol (URECHOLINE) 25 MG tablet Take 25 mg by mouth 4 (four) times daily.    . calcium-vitamin D (OSCAL WITH D) 500-200 MG-UNIT per tablet Take 1 tablet by mouth daily.    . Cholecalciferol (VITAMIN D3) 1000 UNITS CAPS Take 1,000 Units by mouth daily. Take one tab daily    . conjugated estrogens (PREMARIN) vaginal cream Place 1 g vaginally every other day.     . diltiazem (CARDIZEM CD) 120 MG 24 hr capsule Take 1 capsule (120 mg total) by mouth daily. 30 capsule 6  . diphenhydrAMINE (BENADRYL) 25 mg capsule Take 25 mg by mouth daily as needed for sleep.     Marland Kitchen docusate sodium (COLACE) 100 MG capsule 100 mg. Take 2 tabs twice a day     . esomeprazole (NEXIUM) 40 MG capsule Take 40 mg by mouth daily before breakfast.     . Ferrous Gluconate (IRON) 240 (27 FE) MG TABS Take 1 tablet by mouth daily.    . folic acid (FOLVITE) 800 MCG tablet Take 800 mcg by mouth daily.    . hyoscyamine (LEVSIN SL) 0.125 MG SL tablet Take 0.125 mg by mouth every 6 (six) hours as needed for cramping or diarrhea or loose stools.     Marland Kitchen levothyroxine (SYNTHROID, LEVOTHROID) 75 MCG tablet Take 75 mcg by mouth daily before breakfast.     . MELATONIN PO Take 1 tablet by mouth at bedtime.    Marland Kitchen morphine (MS CONTIN) 15 MG 12 hr tablet 15 mg every 8 (eight) hours.     Marland Kitchen morphine (MS CONTIN) 30 MG 12 hr tablet Take 30 mg by mouth every 8 (eight) hours.     Marland Kitchen morphine (MSIR) 15 MG tablet Take 15 mg by mouth every 6 (six) hours as needed for pain.      . Multiple Vitamin (MULTIVITAMIN) tablet Take 1 tablet by mouth daily.     . nitrofurantoin (MACRODANTIN) 100 MG capsule Take 100 mg by mouth at bedtime.     . nitroGLYCERIN (NITROSTAT) 0.4 MG SL tablet Place one tablet under tongue every 5 minutes up to 3 doses as needed for chest pain. No more than 3 doses over  a 15 minute period. 100 tablet 0  . polyethylene glycol powder (GLYCOLAX/MIRALAX) powder Take 17 g by mouth daily as needed (Constipation).     . predniSONE (DELTASONE) 5 MG tablet Take by mouth every other day. 1/4 tab daily    . raloxifene (EVISTA) 60 MG tablet Take 60 mg by mouth daily.     Marland Kitchen. SPIRIVA HANDIHALER 18 MCG inhalation capsule     . Tamsulosin HCl (FLOMAX) 0.4 MG CAPS Take 0.4 mg by mouth daily. Take 1 capsule by mouth daily.    . temazepam (RESTORIL) 30 MG capsule Take 30 mg by mouth at bedtime.     . tretinoin (RETIN-A) 0.05 % cream Apply 1 application topically at bedtime.     . [DISCONTINUED] diltiazem (CARDIZEM SR) 120 MG 12 hr capsule TAKE ONE CAPSULE BY MOUTH EVERY DAY 30 capsule 5  . [DISCONTINUED] diltiazem (TIAZAC) 120 MG 24 hr capsule Take 1 capsule (120 mg total) by mouth daily. 30 capsule 6   No current facility-administered medications for this visit.    Review of Systems : See HPI for pertinent positives and negatives.  Physical Examination  Filed Vitals:   04/01/15 1345 04/01/15 1347  BP: 131/65 125/62  Pulse: 63 68  Temp: 98 F (36.7 C)   TempSrc: Oral   Resp: 14   Height: 5\' 2"  (1.575 m)   Weight: 129 lb (58.514 kg)   SpO2: 100%    Body mass index is 23.59 kg/(m^2).   General: WDWN female in NAD GAIT: antalgic Eyes: PERRLA Pulmonary: CTAB, no rales, no rhonchi, & no wheezing.  Cardiac: regular rhythm, positive murmur.  VASCULAR EXAM Carotid Bruits Left Right   Transmitted cardiac murmur Transmitted cardiac murmur   Aorta is not palpable. Radial pulses are 2+ palpable and equal.       LE Pulses LEFT RIGHT   FEMORAL  palpable  palpable    POPLITEAL not palpable  not palpable   POSTERIOR TIBIAL  palpable   palpable    DORSALIS PEDIS  ANTERIOR TIBIAL palpable  palpable     Gastrointestinal: soft, nontender, BS WNL, no r/g,no palpable masses.  Musculoskeletal: No muscle atrophy/wasting. M/S 5/5 throughout, Extremities without ischemic changes.  Neurologic: A&O X 3; Appropriate Affect ; SENSATION ;normal;  Speech is normal CN 2-12 intact except, Pain and light touch intact in extremities, Motor exam as listed above.          Non-Invasive Vascular Imaging CAROTID DUPLEX 04/01/2015   Right ICA: 1 - 39 % stenosis. Left ICA: 1 - 39 % stenosis. Bilateral vertebral artery is antegrade. No significant change from 03/27/2013.   Assessment: Mckenzie [name] is a 79 y.o. female who has no history of stroke or TIA. Today's carotid duplex suggests minimal bilateral internal carotid stenosis. No significant change from 03/27/2013.    Plan: Follow-up in 2 years with Carotid Duplex scan.   I discussed in depth with the patient the nature of atherosclerosis, and emphasized the importance of maximal medical management including strict control of blood pressure, blood glucose, and lipid levels, obtaining regular exercise, and continued cessation of smoking.  The patient is aware that without maximal medical management the underlying atherosclerotic disease process will progress, limiting the benefit of any interventions. The patient was given information about stroke prevention and what symptoms should prompt the patient to seek immediate medical care. Thank you for allowing us to participate in this patient's care.  Rosalita ChessmanSuzanne Nickel, RN, MSN, FNP-C [facility]  Office:  [phone] Physician: Myra Gianotti  04/01/2015 12:47 PM

## 2015-04-25 ENCOUNTER — Telehealth: Payer: Self-pay | Admitting: *Deleted

## 2015-05-07 DIAGNOSIS — M7062 Trochanteric bursitis, left hip: Secondary | ICD-10-CM | POA: Diagnosis not present

## 2015-05-13 DIAGNOSIS — E042 Nontoxic multinodular goiter: Secondary | ICD-10-CM | POA: Diagnosis not present

## 2015-05-13 DIAGNOSIS — Z9889 Other specified postprocedural states: Secondary | ICD-10-CM | POA: Diagnosis not present

## 2015-05-13 DIAGNOSIS — Z79899 Other long term (current) drug therapy: Secondary | ICD-10-CM | POA: Diagnosis not present

## 2015-05-13 DIAGNOSIS — E890 Postprocedural hypothyroidism: Secondary | ICD-10-CM | POA: Diagnosis not present

## 2015-05-23 DIAGNOSIS — I10 Essential (primary) hypertension: Secondary | ICD-10-CM | POA: Diagnosis not present

## 2015-05-23 DIAGNOSIS — N3001 Acute cystitis with hematuria: Secondary | ICD-10-CM | POA: Diagnosis not present

## 2015-05-23 DIAGNOSIS — M545 Low back pain: Secondary | ICD-10-CM | POA: Diagnosis not present

## 2015-05-27 DIAGNOSIS — M4716 Other spondylosis with myelopathy, lumbar region: Secondary | ICD-10-CM | POA: Diagnosis not present

## 2015-05-27 DIAGNOSIS — M47816 Spondylosis without myelopathy or radiculopathy, lumbar region: Secondary | ICD-10-CM | POA: Diagnosis not present

## 2015-05-31 DIAGNOSIS — M9983 Other biomechanical lesions of lumbar region: Secondary | ICD-10-CM | POA: Diagnosis not present

## 2015-05-31 DIAGNOSIS — M419 Scoliosis, unspecified: Secondary | ICD-10-CM | POA: Diagnosis not present

## 2015-05-31 DIAGNOSIS — M4806 Spinal stenosis, lumbar region: Secondary | ICD-10-CM | POA: Diagnosis not present

## 2015-05-31 DIAGNOSIS — M47816 Spondylosis without myelopathy or radiculopathy, lumbar region: Secondary | ICD-10-CM | POA: Diagnosis not present

## 2015-06-10 NOTE — Telephone Encounter (Signed)
Entered in error

## 2015-06-25 DIAGNOSIS — M5136 Other intervertebral disc degeneration, lumbar region: Secondary | ICD-10-CM | POA: Diagnosis not present

## 2015-06-25 DIAGNOSIS — M47812 Spondylosis without myelopathy or radiculopathy, cervical region: Secondary | ICD-10-CM | POA: Diagnosis not present

## 2015-06-25 DIAGNOSIS — M4806 Spinal stenosis, lumbar region: Secondary | ICD-10-CM | POA: Diagnosis not present

## 2015-06-25 DIAGNOSIS — M47816 Spondylosis without myelopathy or radiculopathy, lumbar region: Secondary | ICD-10-CM | POA: Diagnosis not present

## 2015-07-04 DIAGNOSIS — D599 Acquired hemolytic anemia, unspecified: Secondary | ICD-10-CM | POA: Diagnosis not present

## 2015-07-04 DIAGNOSIS — Z7952 Long term (current) use of systemic steroids: Secondary | ICD-10-CM | POA: Diagnosis not present

## 2015-07-04 DIAGNOSIS — D591 Other autoimmune hemolytic anemias: Secondary | ICD-10-CM | POA: Diagnosis not present

## 2015-07-05 DIAGNOSIS — M47816 Spondylosis without myelopathy or radiculopathy, lumbar region: Secondary | ICD-10-CM | POA: Diagnosis not present

## 2015-07-05 DIAGNOSIS — M5136 Other intervertebral disc degeneration, lumbar region: Secondary | ICD-10-CM | POA: Diagnosis not present

## 2015-07-05 DIAGNOSIS — M5416 Radiculopathy, lumbar region: Secondary | ICD-10-CM | POA: Diagnosis not present

## 2015-08-12 ENCOUNTER — Other Ambulatory Visit: Payer: Self-pay | Admitting: Cardiology

## 2015-08-16 DIAGNOSIS — J449 Chronic obstructive pulmonary disease, unspecified: Secondary | ICD-10-CM | POA: Diagnosis not present

## 2015-08-16 DIAGNOSIS — Z01818 Encounter for other preprocedural examination: Secondary | ICD-10-CM | POA: Diagnosis not present

## 2015-08-16 DIAGNOSIS — Z79899 Other long term (current) drug therapy: Secondary | ICD-10-CM | POA: Diagnosis not present

## 2015-08-16 DIAGNOSIS — E039 Hypothyroidism, unspecified: Secondary | ICD-10-CM | POA: Diagnosis not present

## 2015-08-16 DIAGNOSIS — D589 Hereditary hemolytic anemia, unspecified: Secondary | ICD-10-CM | POA: Diagnosis not present

## 2015-08-16 DIAGNOSIS — I252 Old myocardial infarction: Secondary | ICD-10-CM | POA: Diagnosis not present

## 2015-08-16 DIAGNOSIS — K219 Gastro-esophageal reflux disease without esophagitis: Secondary | ICD-10-CM | POA: Diagnosis not present

## 2015-08-16 DIAGNOSIS — M47816 Spondylosis without myelopathy or radiculopathy, lumbar region: Secondary | ICD-10-CM | POA: Diagnosis not present

## 2015-08-16 DIAGNOSIS — M810 Age-related osteoporosis without current pathological fracture: Secondary | ICD-10-CM | POA: Diagnosis not present

## 2015-08-16 DIAGNOSIS — G8929 Other chronic pain: Secondary | ICD-10-CM | POA: Diagnosis not present

## 2015-08-16 DIAGNOSIS — Z7982 Long term (current) use of aspirin: Secondary | ICD-10-CM | POA: Diagnosis not present

## 2015-08-16 DIAGNOSIS — I4891 Unspecified atrial fibrillation: Secondary | ICD-10-CM | POA: Diagnosis not present

## 2015-08-21 DIAGNOSIS — I10 Essential (primary) hypertension: Secondary | ICD-10-CM | POA: Diagnosis not present

## 2015-08-21 DIAGNOSIS — Z955 Presence of coronary angioplasty implant and graft: Secondary | ICD-10-CM | POA: Diagnosis not present

## 2015-08-21 DIAGNOSIS — M1990 Unspecified osteoarthritis, unspecified site: Secondary | ICD-10-CM | POA: Diagnosis present

## 2015-08-21 DIAGNOSIS — M4726 Other spondylosis with radiculopathy, lumbar region: Secondary | ICD-10-CM | POA: Diagnosis not present

## 2015-08-21 DIAGNOSIS — E039 Hypothyroidism, unspecified: Secondary | ICD-10-CM | POA: Diagnosis not present

## 2015-08-21 DIAGNOSIS — M4326 Fusion of spine, lumbar region: Secondary | ICD-10-CM | POA: Diagnosis not present

## 2015-08-21 DIAGNOSIS — S32048A Other fracture of fourth lumbar vertebra, initial encounter for closed fracture: Secondary | ICD-10-CM | POA: Diagnosis not present

## 2015-08-21 DIAGNOSIS — X58XXXA Exposure to other specified factors, initial encounter: Secondary | ICD-10-CM | POA: Diagnosis not present

## 2015-08-21 DIAGNOSIS — M4186 Other forms of scoliosis, lumbar region: Secondary | ICD-10-CM | POA: Diagnosis not present

## 2015-08-21 DIAGNOSIS — J449 Chronic obstructive pulmonary disease, unspecified: Secondary | ICD-10-CM | POA: Diagnosis not present

## 2015-08-21 DIAGNOSIS — I2510 Atherosclerotic heart disease of native coronary artery without angina pectoris: Secondary | ICD-10-CM | POA: Diagnosis not present

## 2015-08-21 DIAGNOSIS — M47816 Spondylosis without myelopathy or radiculopathy, lumbar region: Secondary | ICD-10-CM | POA: Diagnosis not present

## 2015-08-21 DIAGNOSIS — Z881 Allergy status to other antibiotic agents status: Secondary | ICD-10-CM | POA: Diagnosis not present

## 2015-08-21 DIAGNOSIS — E785 Hyperlipidemia, unspecified: Secondary | ICD-10-CM | POA: Diagnosis not present

## 2015-08-21 DIAGNOSIS — Z7982 Long term (current) use of aspirin: Secondary | ICD-10-CM | POA: Diagnosis not present

## 2015-08-21 DIAGNOSIS — I4891 Unspecified atrial fibrillation: Secondary | ICD-10-CM | POA: Diagnosis not present

## 2015-08-21 DIAGNOSIS — Z7952 Long term (current) use of systemic steroids: Secondary | ICD-10-CM | POA: Diagnosis not present

## 2015-08-21 DIAGNOSIS — Z79899 Other long term (current) drug therapy: Secondary | ICD-10-CM | POA: Diagnosis not present

## 2015-08-21 DIAGNOSIS — M4806 Spinal stenosis, lumbar region: Secondary | ICD-10-CM | POA: Diagnosis not present

## 2015-08-21 DIAGNOSIS — Z79891 Long term (current) use of opiate analgesic: Secondary | ICD-10-CM | POA: Diagnosis not present

## 2015-08-21 DIAGNOSIS — M810 Age-related osteoporosis without current pathological fracture: Secondary | ICD-10-CM | POA: Diagnosis present

## 2015-08-21 DIAGNOSIS — I252 Old myocardial infarction: Secondary | ICD-10-CM | POA: Diagnosis not present

## 2015-08-21 DIAGNOSIS — E7800 Pure hypercholesterolemia, unspecified: Secondary | ICD-10-CM | POA: Diagnosis present

## 2015-08-21 DIAGNOSIS — G8929 Other chronic pain: Secondary | ICD-10-CM | POA: Diagnosis present

## 2015-08-27 DIAGNOSIS — I10 Essential (primary) hypertension: Secondary | ICD-10-CM | POA: Diagnosis not present

## 2015-08-27 DIAGNOSIS — M791 Myalgia: Secondary | ICD-10-CM | POA: Diagnosis not present

## 2015-08-27 DIAGNOSIS — Z9981 Dependence on supplemental oxygen: Secondary | ICD-10-CM | POA: Diagnosis not present

## 2015-08-27 DIAGNOSIS — M810 Age-related osteoporosis without current pathological fracture: Secondary | ICD-10-CM | POA: Diagnosis not present

## 2015-08-27 DIAGNOSIS — I252 Old myocardial infarction: Secondary | ICD-10-CM | POA: Diagnosis not present

## 2015-08-27 DIAGNOSIS — Z4789 Encounter for other orthopedic aftercare: Secondary | ICD-10-CM | POA: Diagnosis not present

## 2015-08-27 DIAGNOSIS — M1990 Unspecified osteoarthritis, unspecified site: Secondary | ICD-10-CM | POA: Diagnosis not present

## 2015-08-27 DIAGNOSIS — E7800 Pure hypercholesterolemia, unspecified: Secondary | ICD-10-CM | POA: Diagnosis not present

## 2015-08-27 DIAGNOSIS — J449 Chronic obstructive pulmonary disease, unspecified: Secondary | ICD-10-CM | POA: Diagnosis not present

## 2015-08-27 DIAGNOSIS — I7300 Raynaud's syndrome without gangrene: Secondary | ICD-10-CM | POA: Diagnosis not present

## 2015-08-27 DIAGNOSIS — E039 Hypothyroidism, unspecified: Secondary | ICD-10-CM | POA: Diagnosis not present

## 2015-08-30 DIAGNOSIS — Z4789 Encounter for other orthopedic aftercare: Secondary | ICD-10-CM | POA: Diagnosis not present

## 2015-08-30 DIAGNOSIS — M791 Myalgia: Secondary | ICD-10-CM | POA: Diagnosis not present

## 2015-08-30 DIAGNOSIS — M4326 Fusion of spine, lumbar region: Secondary | ICD-10-CM | POA: Diagnosis not present

## 2015-08-30 DIAGNOSIS — Z981 Arthrodesis status: Secondary | ICD-10-CM | POA: Diagnosis not present

## 2015-08-30 DIAGNOSIS — M47816 Spondylosis without myelopathy or radiculopathy, lumbar region: Secondary | ICD-10-CM | POA: Diagnosis not present

## 2015-08-30 DIAGNOSIS — M810 Age-related osteoporosis without current pathological fracture: Secondary | ICD-10-CM | POA: Diagnosis not present

## 2015-08-30 DIAGNOSIS — I7300 Raynaud's syndrome without gangrene: Secondary | ICD-10-CM | POA: Diagnosis not present

## 2015-08-30 DIAGNOSIS — I10 Essential (primary) hypertension: Secondary | ICD-10-CM | POA: Diagnosis not present

## 2015-08-30 DIAGNOSIS — M1990 Unspecified osteoarthritis, unspecified site: Secondary | ICD-10-CM | POA: Diagnosis not present

## 2015-09-03 DIAGNOSIS — I7300 Raynaud's syndrome without gangrene: Secondary | ICD-10-CM | POA: Diagnosis not present

## 2015-09-03 DIAGNOSIS — M791 Myalgia: Secondary | ICD-10-CM | POA: Diagnosis not present

## 2015-09-03 DIAGNOSIS — I10 Essential (primary) hypertension: Secondary | ICD-10-CM | POA: Diagnosis not present

## 2015-09-03 DIAGNOSIS — M1990 Unspecified osteoarthritis, unspecified site: Secondary | ICD-10-CM | POA: Diagnosis not present

## 2015-09-03 DIAGNOSIS — Z4789 Encounter for other orthopedic aftercare: Secondary | ICD-10-CM | POA: Diagnosis not present

## 2015-09-03 DIAGNOSIS — M810 Age-related osteoporosis without current pathological fracture: Secondary | ICD-10-CM | POA: Diagnosis not present

## 2015-09-05 DIAGNOSIS — M810 Age-related osteoporosis without current pathological fracture: Secondary | ICD-10-CM | POA: Diagnosis not present

## 2015-09-05 DIAGNOSIS — Z4789 Encounter for other orthopedic aftercare: Secondary | ICD-10-CM | POA: Diagnosis not present

## 2015-09-05 DIAGNOSIS — I7300 Raynaud's syndrome without gangrene: Secondary | ICD-10-CM | POA: Diagnosis not present

## 2015-09-05 DIAGNOSIS — M791 Myalgia: Secondary | ICD-10-CM | POA: Diagnosis not present

## 2015-09-05 DIAGNOSIS — M1990 Unspecified osteoarthritis, unspecified site: Secondary | ICD-10-CM | POA: Diagnosis not present

## 2015-09-05 DIAGNOSIS — I10 Essential (primary) hypertension: Secondary | ICD-10-CM | POA: Diagnosis not present

## 2015-09-10 DIAGNOSIS — M810 Age-related osteoporosis without current pathological fracture: Secondary | ICD-10-CM | POA: Diagnosis not present

## 2015-09-10 DIAGNOSIS — M1990 Unspecified osteoarthritis, unspecified site: Secondary | ICD-10-CM | POA: Diagnosis not present

## 2015-09-10 DIAGNOSIS — I10 Essential (primary) hypertension: Secondary | ICD-10-CM | POA: Diagnosis not present

## 2015-09-10 DIAGNOSIS — Z4789 Encounter for other orthopedic aftercare: Secondary | ICD-10-CM | POA: Diagnosis not present

## 2015-09-10 DIAGNOSIS — I7300 Raynaud's syndrome without gangrene: Secondary | ICD-10-CM | POA: Diagnosis not present

## 2015-09-10 DIAGNOSIS — M791 Myalgia: Secondary | ICD-10-CM | POA: Diagnosis not present

## 2015-09-11 DIAGNOSIS — J449 Chronic obstructive pulmonary disease, unspecified: Secondary | ICD-10-CM | POA: Diagnosis not present

## 2015-09-11 DIAGNOSIS — M545 Low back pain: Secondary | ICD-10-CM | POA: Diagnosis not present

## 2015-09-11 DIAGNOSIS — I358 Other nonrheumatic aortic valve disorders: Secondary | ICD-10-CM | POA: Diagnosis not present

## 2015-09-11 DIAGNOSIS — E039 Hypothyroidism, unspecified: Secondary | ICD-10-CM | POA: Diagnosis not present

## 2015-09-11 DIAGNOSIS — R609 Edema, unspecified: Secondary | ICD-10-CM | POA: Diagnosis not present

## 2015-09-11 DIAGNOSIS — I10 Essential (primary) hypertension: Secondary | ICD-10-CM | POA: Diagnosis not present

## 2015-09-12 DIAGNOSIS — I7300 Raynaud's syndrome without gangrene: Secondary | ICD-10-CM | POA: Diagnosis not present

## 2015-09-12 DIAGNOSIS — M810 Age-related osteoporosis without current pathological fracture: Secondary | ICD-10-CM | POA: Diagnosis not present

## 2015-09-12 DIAGNOSIS — I10 Essential (primary) hypertension: Secondary | ICD-10-CM | POA: Diagnosis not present

## 2015-09-12 DIAGNOSIS — M1990 Unspecified osteoarthritis, unspecified site: Secondary | ICD-10-CM | POA: Diagnosis not present

## 2015-09-12 DIAGNOSIS — Z4789 Encounter for other orthopedic aftercare: Secondary | ICD-10-CM | POA: Diagnosis not present

## 2015-09-12 DIAGNOSIS — M791 Myalgia: Secondary | ICD-10-CM | POA: Diagnosis not present

## 2015-09-17 DIAGNOSIS — M810 Age-related osteoporosis without current pathological fracture: Secondary | ICD-10-CM | POA: Diagnosis not present

## 2015-09-17 DIAGNOSIS — I10 Essential (primary) hypertension: Secondary | ICD-10-CM | POA: Diagnosis not present

## 2015-09-17 DIAGNOSIS — M791 Myalgia: Secondary | ICD-10-CM | POA: Diagnosis not present

## 2015-09-17 DIAGNOSIS — M1990 Unspecified osteoarthritis, unspecified site: Secondary | ICD-10-CM | POA: Diagnosis not present

## 2015-09-17 DIAGNOSIS — Z4789 Encounter for other orthopedic aftercare: Secondary | ICD-10-CM | POA: Diagnosis not present

## 2015-09-17 DIAGNOSIS — I7300 Raynaud's syndrome without gangrene: Secondary | ICD-10-CM | POA: Diagnosis not present

## 2015-09-19 ENCOUNTER — Other Ambulatory Visit: Payer: Self-pay | Admitting: Cardiovascular Disease

## 2015-09-19 DIAGNOSIS — Z4789 Encounter for other orthopedic aftercare: Secondary | ICD-10-CM | POA: Diagnosis not present

## 2015-09-19 DIAGNOSIS — M791 Myalgia: Secondary | ICD-10-CM | POA: Diagnosis not present

## 2015-09-19 DIAGNOSIS — M810 Age-related osteoporosis without current pathological fracture: Secondary | ICD-10-CM | POA: Diagnosis not present

## 2015-09-19 DIAGNOSIS — I7300 Raynaud's syndrome without gangrene: Secondary | ICD-10-CM | POA: Diagnosis not present

## 2015-09-19 DIAGNOSIS — M1990 Unspecified osteoarthritis, unspecified site: Secondary | ICD-10-CM | POA: Diagnosis not present

## 2015-09-19 DIAGNOSIS — I10 Essential (primary) hypertension: Secondary | ICD-10-CM | POA: Diagnosis not present

## 2015-09-19 MED ORDER — DILTIAZEM HCL ER COATED BEADS 120 MG PO CP24: ORAL_CAPSULE | ORAL | Status: DC

## 2015-09-19 NOTE — Telephone Encounter (Signed)
rx sent per patient's request.

## 2015-09-19 NOTE — Telephone Encounter (Signed)
REFILL: diltiazem (CARDIZEM CD) 120 MG 24 hr capsule

## 2015-09-24 DIAGNOSIS — M810 Age-related osteoporosis without current pathological fracture: Secondary | ICD-10-CM | POA: Diagnosis not present

## 2015-09-24 DIAGNOSIS — Z4789 Encounter for other orthopedic aftercare: Secondary | ICD-10-CM | POA: Diagnosis not present

## 2015-09-24 DIAGNOSIS — M1990 Unspecified osteoarthritis, unspecified site: Secondary | ICD-10-CM | POA: Diagnosis not present

## 2015-09-24 DIAGNOSIS — I7300 Raynaud's syndrome without gangrene: Secondary | ICD-10-CM | POA: Diagnosis not present

## 2015-09-24 DIAGNOSIS — M791 Myalgia: Secondary | ICD-10-CM | POA: Diagnosis not present

## 2015-09-24 DIAGNOSIS — I10 Essential (primary) hypertension: Secondary | ICD-10-CM | POA: Diagnosis not present

## 2015-09-27 DIAGNOSIS — M1990 Unspecified osteoarthritis, unspecified site: Secondary | ICD-10-CM | POA: Diagnosis not present

## 2015-09-27 DIAGNOSIS — M810 Age-related osteoporosis without current pathological fracture: Secondary | ICD-10-CM | POA: Diagnosis not present

## 2015-09-27 DIAGNOSIS — I7300 Raynaud's syndrome without gangrene: Secondary | ICD-10-CM | POA: Diagnosis not present

## 2015-09-27 DIAGNOSIS — M791 Myalgia: Secondary | ICD-10-CM | POA: Diagnosis not present

## 2015-09-27 DIAGNOSIS — I10 Essential (primary) hypertension: Secondary | ICD-10-CM | POA: Diagnosis not present

## 2015-09-27 DIAGNOSIS — Z4789 Encounter for other orthopedic aftercare: Secondary | ICD-10-CM | POA: Diagnosis not present

## 2015-10-01 DIAGNOSIS — M1990 Unspecified osteoarthritis, unspecified site: Secondary | ICD-10-CM | POA: Diagnosis not present

## 2015-10-01 DIAGNOSIS — M791 Myalgia: Secondary | ICD-10-CM | POA: Diagnosis not present

## 2015-10-01 DIAGNOSIS — M810 Age-related osteoporosis without current pathological fracture: Secondary | ICD-10-CM | POA: Diagnosis not present

## 2015-10-01 DIAGNOSIS — I7300 Raynaud's syndrome without gangrene: Secondary | ICD-10-CM | POA: Diagnosis not present

## 2015-10-01 DIAGNOSIS — Z4789 Encounter for other orthopedic aftercare: Secondary | ICD-10-CM | POA: Diagnosis not present

## 2015-10-01 DIAGNOSIS — I10 Essential (primary) hypertension: Secondary | ICD-10-CM | POA: Diagnosis not present

## 2015-10-03 DIAGNOSIS — M1990 Unspecified osteoarthritis, unspecified site: Secondary | ICD-10-CM | POA: Diagnosis not present

## 2015-10-03 DIAGNOSIS — Z4789 Encounter for other orthopedic aftercare: Secondary | ICD-10-CM | POA: Diagnosis not present

## 2015-10-03 DIAGNOSIS — M791 Myalgia: Secondary | ICD-10-CM | POA: Diagnosis not present

## 2015-10-03 DIAGNOSIS — I10 Essential (primary) hypertension: Secondary | ICD-10-CM | POA: Diagnosis not present

## 2015-10-03 DIAGNOSIS — I7300 Raynaud's syndrome without gangrene: Secondary | ICD-10-CM | POA: Diagnosis not present

## 2015-10-03 DIAGNOSIS — M810 Age-related osteoporosis without current pathological fracture: Secondary | ICD-10-CM | POA: Diagnosis not present

## 2015-10-22 DIAGNOSIS — R300 Dysuria: Secondary | ICD-10-CM | POA: Diagnosis not present

## 2015-10-22 DIAGNOSIS — R609 Edema, unspecified: Secondary | ICD-10-CM | POA: Diagnosis not present

## 2015-10-24 ENCOUNTER — Ambulatory Visit (INDEPENDENT_AMBULATORY_CARE_PROVIDER_SITE_OTHER): Payer: Medicare Other | Admitting: Cardiovascular Disease

## 2015-10-24 ENCOUNTER — Encounter: Payer: Self-pay | Admitting: Cardiovascular Disease

## 2015-10-24 VITALS — BP 110/44 | HR 70 | Ht 62.0 in | Wt 122.0 lb

## 2015-10-24 DIAGNOSIS — R600 Localized edema: Secondary | ICD-10-CM

## 2015-10-24 DIAGNOSIS — I519 Heart disease, unspecified: Secondary | ICD-10-CM | POA: Diagnosis not present

## 2015-10-24 DIAGNOSIS — I2510 Atherosclerotic heart disease of native coronary artery without angina pectoris: Secondary | ICD-10-CM | POA: Diagnosis not present

## 2015-10-24 DIAGNOSIS — I6523 Occlusion and stenosis of bilateral carotid arteries: Secondary | ICD-10-CM | POA: Diagnosis not present

## 2015-10-24 DIAGNOSIS — I5189 Other ill-defined heart diseases: Secondary | ICD-10-CM

## 2015-10-24 DIAGNOSIS — I340 Nonrheumatic mitral (valve) insufficiency: Secondary | ICD-10-CM

## 2015-10-24 DIAGNOSIS — R002 Palpitations: Secondary | ICD-10-CM

## 2015-10-24 DIAGNOSIS — E785 Hyperlipidemia, unspecified: Secondary | ICD-10-CM

## 2015-10-24 DIAGNOSIS — I10 Essential (primary) hypertension: Secondary | ICD-10-CM

## 2015-10-24 MED ORDER — POTASSIUM CHLORIDE CRYS ER 20 MEQ PO TBCR: 20.0000 meq | EXTENDED_RELEASE_TABLET | Freq: Every day | ORAL | Status: DC

## 2015-10-24 MED ORDER — FUROSEMIDE 40 MG PO TABS: 40.0000 mg | ORAL_TABLET | Freq: Every day | ORAL | Status: DC

## 2015-10-24 MED ORDER — METOPROLOL TARTRATE 25 MG PO TABS: 25.0000 mg | ORAL_TABLET | Freq: Two times a day (BID) | ORAL | Status: DC

## 2015-10-24 NOTE — Patient Instructions (Addendum)
   Stop Diltiazem.  Begin Metoprolol tart 25mg  twice a day.    Begin Lasix 40mg  daily x 3 days only.  Begin Potassium 20meq daily x 3 days only.   [facility]. Continue all other medications.   Call office with any future swelling issues.  Follow up in  3 months.

## 2015-10-24 NOTE — Progress Notes (Signed)
Patient ID: ZYANYA GLAZA, female   DOB: 1935/11/09, 80 y.o.   MRN: [id number]      SUBJECTIVE: The patient is a 80 year old woman who presents for follow-up of coronary artery disease. She had an LAD stent placed in 2003 with coronary angiography performed in 2012 which showed patency of the stent. She is a former patient of Dr. Myrtis Ser and this is my first time meeting her.  Carotid artery Dopplers on 04/01/15 showed less than 40% stenosis bilaterally.  Echocardiogram 08/06/14 showed normal left ventricular systolic function, EF 65%, mild concentric LVH, and diastolic dysfunction.  ECG performed in the office today which I personally interpreted demonstrated sinus rhythm with a PVC.  She denies chest pain, palpitations, and shortness of breath. She has had bilateral leg swelling which is sometimes painful. Her PCP gave her a diuretic to be taken for a few days which alleviated her swelling but then it quickly recurred.  Review of Systems: As per "subjective", otherwise negative.  Allergies  Allergen Reactions  . Sulfamethoxazole-Trimethoprim Nausea Only  . Tetracycline Nausea Only    Current Outpatient Prescriptions  Medication Sig Dispense Refill  . albuterol (PROVENTIL HFA;VENTOLIN HFA) 108 (90 BASE) MCG/ACT inhaler Inhale 2 puffs into the lungs every 6 (six) hours as needed for wheezing.    Marland Kitchen aspirin 81 MG tablet Take 81 mg by mouth daily.    Marland Kitchen atorvastatin (LIPITOR) 10 MG tablet Take 10 mg by mouth daily.     . benazepril (LOTENSIN) 5 MG tablet Take 5 mg by mouth daily.     . bethanechol (URECHOLINE) 25 MG tablet Take 25 mg by mouth 4 (four) times daily.    . calcium-vitamin D (OSCAL WITH D) 500-200 MG-UNIT per tablet Take 1 tablet by mouth daily.    . Cholecalciferol (VITAMIN D3) 1000 UNITS CAPS Take 1,000 Units by mouth daily. Take one tab daily    . conjugated estrogens (PREMARIN) vaginal cream Place 1 g vaginally every other day.     . diltiazem (CARDIZEM CD) 120 MG 24 hr  capsule TAKE 1 CAPSULE BY MOUTH ONCE A DAY. 30 capsule 2  . diphenhydrAMINE (BENADRYL) 25 mg capsule Take 25 mg by mouth daily as needed for sleep.     Marland Kitchen docusate sodium (COLACE) 100 MG capsule 100 mg. Take 2 tabs twice a day     . esomeprazole (NEXIUM) 40 MG capsule Take 40 mg by mouth daily before breakfast.     . Ferrous Gluconate (IRON) 240 (27 FE) MG TABS Take 1 tablet by mouth daily.    . folic acid (FOLVITE) 800 MCG tablet Take 800 mcg by mouth daily.    . hyoscyamine (LEVSIN SL) 0.125 MG SL tablet Take 0.125 mg by mouth every 6 (six) hours as needed for cramping or diarrhea or loose stools.     Marland Kitchen levothyroxine (SYNTHROID, LEVOTHROID) 75 MCG tablet Take 75 mcg by mouth daily before breakfast.     . MELATONIN PO Take 1 tablet by mouth at bedtime.    . Multiple Vitamin (MULTIVITAMIN) tablet Take 1 tablet by mouth daily.     . nitrofurantoin (MACRODANTIN) 100 MG capsule Take 100 mg by mouth at bedtime.     . nitroGLYCERIN (NITROSTAT) 0.4 MG SL tablet Place one tablet under tongue every 5 minutes up to 3 doses as needed for chest pain. No more than 3 doses over a 15 minute period. 100 tablet 0  . OxyCODONE (OXYCONTIN) 15 mg T12A 12 hr tablet Take 15 mg  by mouth as needed (breakthrough pain).    . polyethylene glycol powder (GLYCOLAX/MIRALAX) powder Take 17 g by mouth daily as needed (Constipation).     . predniSONE (DELTASONE) 5 MG tablet Take by mouth every other day. 1/4 tab daily    . raloxifene (EVISTA) 60 MG tablet Take 60 mg by mouth daily.     . Tamsulosin HCl (FLOMAX) 0.4 MG CAPS Take 0.4 mg by mouth daily. Take 1 capsule by mouth daily.    . temazepam (RESTORIL) 30 MG capsule Take 30 mg by mouth at bedtime.     . OxyCODONE HCl ER (OXYCONTIN) 30 MG T12A Take 30 mg by mouth.    . SPIRIVA HANDIHALER 18 MCG inhalation capsule     . tretinoin (RETIN-A) 0.05 % cream Apply 1 application topically at bedtime.     . [DISCONTINUED] diltiazem (CARDIZEM SR) 120 MG 12 hr capsule TAKE ONE CAPSULE  BY MOUTH EVERY DAY 30 capsule 5  . [DISCONTINUED] diltiazem (TIAZAC) 120 MG 24 hr capsule Take 1 capsule (120 mg total) by mouth daily. 30 capsule 6   No current facility-administered medications for this visit.    Past Medical History  Diagnosis Date  . Left ventricular hypertrophy     EF 65%, echo, April, 2011  . CAD (coronary artery disease)     Stent to LAD, 2003 [facility] / nuclear scan April, 201 to artifact / persistent shortness of breath / catheterization Oct 21, 2010... widely patent stent to the mid LAD, no significant obstructive disease, should be low risk    for back surgery  . Aortic valve sclerosis     Mild, echo, April, 2012  . MR (mitral regurgitation)      Mild echo, 2012 ///  Mild per Echo 2009,  mild, echo, April, 2011  . Carotid arterial disease (HCC)     Moderate-Dr. Myra Gianotti following  . GERD (gastroesophageal reflux disease)   . Hypothyroidism   . ARDS (adult respiratory distress syndrome) (HCC) 2003    with tracheostomy-peritonitis  . UTI (urinary tract infection)     Chronic-on Macrodantin  . Hemolytic anemia (HCC)     Followed by Dr. Derald Macleod  . Diastolic dysfunction     Echo, April, 2011   EF 65%  . Shortness of breath     March, 2012  . Palpitations     Holter, September 16, 2010, PACs and PVCs,one 4 beat run of SVT  . Back pain     Need for surgery, Dr. Trey Sailors  . Ejection fraction     EF 65%, echo, September 15, 2010  . Preoperative clearance     Preop clearance for back surgery  . COPD (chronic obstructive pulmonary disease) (HCC)   . Pneumonia 11-2011  . Myocardial infarction (HCC)   . Hypertension   . Dysrhythmia     afib  . Arthritis   . Chronic back pain     Past Surgical History  Procedure Laterality Date  . Appendectomy    . Tonsillectomy    . Tracheostomy  2003    Following ARDS   . Thyroid surgery  08/2009    Benign adenomas  . Coronary angioplasty with stent placement  2003  . Repair of ruptured lumbar discs  12-2011  . Breast  lumpectomy Left   . Back surgery    . Cataract extraction w/phaco Right 12/05/2012    Procedure: CATARACT EXTRACTION PHACO AND INTRAOCULAR LENS PLACEMENT (IOC);  Surgeon: Gemma Payor, MD;  Location: [facility];  Service: Ophthalmology;  Laterality: Right;  CDE:15.30  . Cataract extraction w/phaco Left 12/15/2012    Procedure: CATARACT EXTRACTION PHACO AND INTRAOCULAR LENS PLACEMENT (IOC);  Surgeon: Gemma PayorKerry Hunt, MD;  Location: [facility];  Service: Ophthalmology;  Laterality: Left;  CDE:22.56  . Spine surgery      lumbar [facility]    Social History   Social History  . Marital Status: Married    Spouse Name: N/A  . Number of Children: N/A  . Years of Education: N/A   Occupational History  . Retired    Social History Main Topics  . Smoking status: Former Smoker -- 1.00 packs/day for 20 years    Types: Cigarettes    Quit date: 06/15/2000  . Smokeless tobacco: Never Used  . Alcohol Use: No  . Drug Use: No  . Sexual Activity: Not on file   Other Topics Concern  . Not on file   Social History Narrative   Regular Exercise: [place]Walk     Filed Vitals:   10/24/15 1324  BP: 110/44  Pulse: 70  Height: 5\' 2"  (1.575 m)  Weight: 122 lb (55.339 kg)  SpO2: 95%    PHYSICAL EXAM General: NAD HEENT: Normal. Neck: No JVD, no thyromegaly. Lungs: Clear to auscultation bilaterally with normal respiratory effort. CV: Nondisplaced PMI.  Regular rate and rhythm, normal S1/S2, no S3/S4, 2/6 pansystolic murmur along left sternal border. 1+ pitting pretibial edema.   Abdomen: Soft, nontender, no distention.  Neurologic: Alert and oriented.  Psych: Normal affect. Skin: Normal. Musculoskeletal: No gross deformities.    ECG: Most recent ECG reviewed.      ASSESSMENT AND PLAN: 1. CAD with LAD stent: Continue ASA and statin.  2. Carotid artery disease: Dopplers reviewed above. Continue ASA and statin.  3. Hyperlipidemia: Continue statin.  4.  Palpitations/PSVT: Stable on long-acting diltiazem. However, given bilateral leg edema, will stop dilitiazem to see if this is the culprit and switch to metoprolol 25 mg BID.  5. Bilateral leg edema: Will stop dilitiazem to see if this is the culprit. Will give Lasix 40 mg daily x 3 days along with KCl 20 meq daily x 3 days.  Dispo: fu 3 months.  Prentice DockerSuresh Parvin Stetzer, M.D., F.A.C.C.

## 2015-10-25 ENCOUNTER — Ambulatory Visit: Payer: Medicare Other | Admitting: Cardiovascular Disease

## 2015-11-01 ENCOUNTER — Telehealth: Payer: Self-pay | Admitting: *Deleted

## 2015-11-01 MED ORDER — METOPROLOL TARTRATE 25 MG PO TABS: 25.0000 mg | ORAL_TABLET | Freq: Two times a day (BID) | ORAL | Status: DC

## 2015-11-01 MED ORDER — FUROSEMIDE 20 MG PO TABS: 20.0000 mg | ORAL_TABLET | ORAL | Status: DC | PRN

## 2015-11-01 NOTE — Telephone Encounter (Signed)
Patient stated that MD told her to call back in regards to her swelling.  Stated that her swelling has gone down excessively, but still has a little at the end of the day.  Was told to call back to see if doctor wanted to continue her on the Metoprolol or not.  She was only given 30 day supply.

## 2015-11-01 NOTE — Telephone Encounter (Signed)
Continue metoprolol (can give 90-day supply) with refills. Can also prescribe Lasix 20 mg prn for leg swelling.

## 2015-11-01 NOTE — Telephone Encounter (Signed)
Patient notified via voice mail.  Medications sent to [facility] today.

## 2015-11-06 DIAGNOSIS — M25552 Pain in left hip: Secondary | ICD-10-CM | POA: Diagnosis not present

## 2015-11-06 DIAGNOSIS — M5136 Other intervertebral disc degeneration, lumbar region: Secondary | ICD-10-CM | POA: Diagnosis not present

## 2015-11-06 DIAGNOSIS — M47816 Spondylosis without myelopathy or radiculopathy, lumbar region: Secondary | ICD-10-CM | POA: Diagnosis not present

## 2015-11-06 DIAGNOSIS — M85852 Other specified disorders of bone density and structure, left thigh: Secondary | ICD-10-CM | POA: Diagnosis not present

## 2015-11-06 DIAGNOSIS — Z981 Arthrodesis status: Secondary | ICD-10-CM | POA: Diagnosis not present

## 2015-11-22 DIAGNOSIS — S73192A Other sprain of left hip, initial encounter: Secondary | ICD-10-CM | POA: Diagnosis not present

## 2015-11-22 DIAGNOSIS — N3289 Other specified disorders of bladder: Secondary | ICD-10-CM | POA: Diagnosis not present

## 2015-11-22 DIAGNOSIS — M25452 Effusion, left hip: Secondary | ICD-10-CM | POA: Diagnosis not present

## 2015-11-22 DIAGNOSIS — M94252 Chondromalacia, left hip: Secondary | ICD-10-CM | POA: Diagnosis not present

## 2015-11-27 DIAGNOSIS — M47816 Spondylosis without myelopathy or radiculopathy, lumbar region: Secondary | ICD-10-CM | POA: Diagnosis not present

## 2015-11-27 DIAGNOSIS — M5136 Other intervertebral disc degeneration, lumbar region: Secondary | ICD-10-CM | POA: Diagnosis not present

## 2015-11-27 DIAGNOSIS — M25552 Pain in left hip: Secondary | ICD-10-CM | POA: Diagnosis not present

## 2015-11-27 DIAGNOSIS — R3914 Feeling of incomplete bladder emptying: Secondary | ICD-10-CM | POA: Diagnosis not present

## 2015-11-27 DIAGNOSIS — S76302A Unspecified injury of muscle, fascia and tendon of the posterior muscle group at thigh level, left thigh, initial encounter: Secondary | ICD-10-CM | POA: Diagnosis not present

## 2015-11-27 DIAGNOSIS — N3946 Mixed incontinence: Secondary | ICD-10-CM | POA: Diagnosis not present

## 2015-11-27 DIAGNOSIS — N3020 Other chronic cystitis without hematuria: Secondary | ICD-10-CM | POA: Diagnosis not present

## 2015-12-31 DIAGNOSIS — M25552 Pain in left hip: Secondary | ICD-10-CM | POA: Diagnosis not present

## 2015-12-31 DIAGNOSIS — M47816 Spondylosis without myelopathy or radiculopathy, lumbar region: Secondary | ICD-10-CM | POA: Diagnosis not present

## 2015-12-31 DIAGNOSIS — M4716 Other spondylosis with myelopathy, lumbar region: Secondary | ICD-10-CM | POA: Diagnosis not present

## 2016-01-28 ENCOUNTER — Ambulatory Visit: Payer: Medicare Other | Admitting: Cardiovascular Disease

## 2016-01-28 ENCOUNTER — Telehealth: Payer: Self-pay | Admitting: Cardiovascular Disease

## 2016-01-28 NOTE — Telephone Encounter (Signed)
Made pt aware [facility] supply and 3 refills on 11/01/15

## 2016-01-28 NOTE — Telephone Encounter (Signed)
Patient is requesting a 90 day supply for the metoprolol tartrate (LOPRESSOR) 25 MG tablet

## 2016-01-29 DIAGNOSIS — N3020 Other chronic cystitis without hematuria: Secondary | ICD-10-CM | POA: Diagnosis not present

## 2016-02-11 DIAGNOSIS — M76899 Other specified enthesopathies of unspecified lower limb, excluding foot: Secondary | ICD-10-CM | POA: Diagnosis not present

## 2016-02-11 DIAGNOSIS — M79652 Pain in left thigh: Secondary | ICD-10-CM | POA: Diagnosis not present

## 2016-02-14 DIAGNOSIS — Z23 Encounter for immunization: Secondary | ICD-10-CM | POA: Diagnosis not present

## 2016-02-25 ENCOUNTER — Ambulatory Visit: Payer: Medicare Other | Admitting: Cardiovascular Disease

## 2016-02-26 DIAGNOSIS — M25552 Pain in left hip: Secondary | ICD-10-CM | POA: Diagnosis not present

## 2016-02-28 ENCOUNTER — Ambulatory Visit: Payer: Medicare Other | Admitting: Cardiovascular Disease

## 2016-02-28 ENCOUNTER — Encounter: Payer: Self-pay | Admitting: Cardiovascular Disease

## 2016-02-28 ENCOUNTER — Ambulatory Visit (INDEPENDENT_AMBULATORY_CARE_PROVIDER_SITE_OTHER): Payer: Medicare Other | Admitting: Cardiovascular Disease

## 2016-02-28 VITALS — BP 136/66 | HR 69 | Ht 62.0 in | Wt 117.8 lb

## 2016-02-28 DIAGNOSIS — I519 Heart disease, unspecified: Secondary | ICD-10-CM | POA: Diagnosis not present

## 2016-02-28 DIAGNOSIS — R600 Localized edema: Secondary | ICD-10-CM | POA: Diagnosis not present

## 2016-02-28 DIAGNOSIS — I6523 Occlusion and stenosis of bilateral carotid arteries: Secondary | ICD-10-CM

## 2016-02-28 DIAGNOSIS — E785 Hyperlipidemia, unspecified: Secondary | ICD-10-CM

## 2016-02-28 DIAGNOSIS — I2510 Atherosclerotic heart disease of native coronary artery without angina pectoris: Secondary | ICD-10-CM | POA: Diagnosis not present

## 2016-02-28 DIAGNOSIS — I5189 Other ill-defined heart diseases: Secondary | ICD-10-CM

## 2016-02-28 DIAGNOSIS — I10 Essential (primary) hypertension: Secondary | ICD-10-CM

## 2016-02-28 DIAGNOSIS — R002 Palpitations: Secondary | ICD-10-CM

## 2016-02-28 DIAGNOSIS — I358 Other nonrheumatic aortic valve disorders: Secondary | ICD-10-CM

## 2016-02-28 MED ORDER — METOPROLOL TARTRATE 25 MG PO TABS: 25.0000 mg | ORAL_TABLET | Freq: Two times a day (BID) | ORAL | 3 refills | Status: DC

## 2016-02-28 NOTE — Progress Notes (Signed)
SUBJECTIVE: The patient is a 80 year old woman who presents for follow-up of coronary artery disease. She had an LAD stent placed in 2003 with coronary angiography performed in 2012 which showed patency of the stent.   Carotid artery Dopplers on 04/01/15 showed less than 40% stenosis bilaterally.  Echocardiogram 08/06/14 showed normal left ventricular systolic function, EF 65%, mild concentric LVH, and diastolic dysfunction.  She denies chest pain, palpitations, leg swelling, and shortness of breath.   Review of Systems: As per "subjective", otherwise negative.  Allergies  Allergen Reactions  . Sulfamethoxazole-Trimethoprim Nausea Only  . Tetracycline Nausea Only    Current Outpatient Prescriptions  Medication Sig Dispense Refill  . albuterol (PROVENTIL HFA;VENTOLIN HFA) 108 (90 BASE) MCG/ACT inhaler Inhale 2 puffs into the lungs every 6 (six) hours as needed for wheezing.    Marland Kitchen aspirin 81 MG tablet Take 81 mg by mouth daily.    Marland Kitchen atorvastatin (LIPITOR) 10 MG tablet Take 10 mg by mouth daily.     . benazepril (LOTENSIN) 5 MG tablet Take 5 mg by mouth daily.     . calcium-vitamin D (OSCAL WITH D) 500-200 MG-UNIT per tablet Take 1 tablet by mouth daily.    . Cholecalciferol (VITAMIN D3) 1000 UNITS CAPS Take 1,000 Units by mouth daily. Take one tab daily    . diphenhydrAMINE (BENADRYL) 25 mg capsule Take 25 mg by mouth daily as needed for sleep.     Marland Kitchen docusate sodium (COLACE) 100 MG capsule 100 mg. Take 2 tabs twice a day     . esomeprazole (NEXIUM) 40 MG capsule Take 40 mg by mouth daily before breakfast.     . Ferrous Gluconate (IRON) 240 (27 FE) MG TABS Take 1 tablet by mouth daily.    . folic acid (FOLVITE) 800 MCG tablet Take 800 mcg by mouth daily.    . hyoscyamine (LEVSIN SL) 0.125 MG SL tablet Take 0.125 mg by mouth every 6 (six) hours as needed for cramping or diarrhea or loose stools.     Marland Kitchen levothyroxine (SYNTHROID, LEVOTHROID) 75 MCG tablet Take 75 mcg by mouth daily  before breakfast.     . MELATONIN PO Take 1 tablet by mouth at bedtime.    . metoprolol tartrate (LOPRESSOR) 25 MG tablet Take 1 tablet (25 mg total) by mouth 2 (two) times daily. 180 tablet 3  . Multiple Vitamin (MULTIVITAMIN) tablet Take 1 tablet by mouth daily.     . nitrofurantoin (MACRODANTIN) 100 MG capsule Take 100 mg by mouth at bedtime.     . nitroGLYCERIN (NITROSTAT) 0.4 MG SL tablet Place one tablet under tongue every 5 minutes up to 3 doses as needed for chest pain. No more than 3 doses over a 15 minute period. 100 tablet 0  . OxyCODONE (OXYCONTIN) 15 mg T12A 12 hr tablet Take 15 mg by mouth as needed (breakthrough pain).    . OxyCODONE HCl ER (OXYCONTIN) 30 MG T12A Take 30 mg by mouth.    . polyethylene glycol powder (GLYCOLAX/MIRALAX) powder Take 17 g by mouth daily as needed (Constipation).     . potassium chloride SA (K-DUR,KLOR-CON) 20 MEQ tablet Take 1 tablet (20 mEq total) by mouth daily. 3 tablet 0  . predniSONE (DELTASONE) 5 MG tablet Take by mouth every other day. 1/4 tab daily    . raloxifene (EVISTA) 60 MG tablet Take 60 mg by mouth daily.     Marland Kitchen SPIRIVA HANDIHALER 18 MCG inhalation capsule     .  Tamsulosin HCl (FLOMAX) 0.4 MG CAPS Take 0.4 mg by mouth daily. Take 1 capsule by mouth daily.    . temazepam (RESTORIL) 30 MG capsule Take 30 mg by mouth at bedtime.     . tretinoin (RETIN-A) 0.05 % cream Apply 1 application topically at bedtime.      No current facility-administered medications for this visit.     Past Medical History:  Diagnosis Date  . Aortic valve sclerosis    Mild, echo, April, 2012  . ARDS (adult respiratory distress syndrome) (HCC) 2003   with tracheostomy-peritonitis  . Arthritis   . Back pain    Need for surgery, Dr. Trey SailorsMark Roy  . CAD (coronary artery disease)    [facility] scan April, 201 to artifact / persistent shortness of breath / catheterization Oct 21, 2010... widely patent stent to the mid LAD, no significant  obstructive disease, should be low risk    for back surgery  . Carotid arterial disease (HCC)    Moderate-Dr. Myra GianottiBrabham following  . Chronic back pain   . COPD (chronic obstructive pulmonary disease) (HCC)   . Diastolic dysfunction    Echo, April, 2011   EF 65%  . Dysrhythmia    afib  . Ejection fraction    EF 65%, echo, September 15, 2010  . GERD (gastroesophageal reflux disease)   . Hemolytic anemia (HCC)    Followed by Dr. Derald Macleodarvosky  . Hypertension   . Hypothyroidism   . Left ventricular hypertrophy    EF 65%, echo, April, 2011  . MR (mitral regurgitation)     Mild echo, 2012 ///  Mild per Echo 2009,  mild, echo, April, 2011  . Myocardial infarction (HCC)   . Palpitations    Holter, September 16, 2010, PACs and PVCs,one 4 beat run of SVT  . Pneumonia 11-2011  . Preoperative clearance    Preop clearance for back surgery  . Shortness of breath    March, 2012  . UTI (urinary tract infection)    Chronic-on Macrodantin    Past Surgical History:  Procedure Laterality Date  . APPENDECTOMY    . BACK SURGERY    . BREAST LUMPECTOMY Left   . CATARACT EXTRACTION W/PHACO Right 12/05/2012   Procedure: CATARACT EXTRACTION PHACO AND INTRAOCULAR LENS PLACEMENT (IOC);  Surgeon: Gemma PayorKerry Hunt, MD;  Location: [facility];  Service: Ophthalmology;  Laterality: Right;  CDE:15.30  . CATARACT EXTRACTION W/PHACO Left 12/15/2012   Procedure: CATARACT EXTRACTION PHACO AND INTRAOCULAR LENS PLACEMENT (IOC);  Surgeon: Gemma PayorKerry Hunt, MD;  Location: [facility];  Service: Ophthalmology;  Laterality: Left;  CDE:22.56  . CORONARY ANGIOPLASTY WITH STENT PLACEMENT  2003  . repair of ruptured lumbar discs  12-2011  . SPINE SURGERY     lumbar [facility]  . THYROID SURGERY  08/2009   Benign adenomas  . TONSILLECTOMY    . TRACHEOSTOMY  2003   Following ARDS     Social History   Social History  . Marital status: Married    Spouse name: N/A  . Number of children: N/A  . Years of education:  N/A   Occupational History  . Retired    Social History Main Topics  . Smoking status: Former Smoker    Packs/day: 1.00    Years: 20.00    Types: Cigarettes    Quit date: 06/15/2000  . Smokeless tobacco: Never Used  . Alcohol use No  . Drug use: No  .  Sexual activity: Not on file   Other Topics Concern  . Not on file   Social History Narrative   Regular Exercise: Yes-Walk     Vitals:   02/28/16 1139  Weight: 117 lb 12.8 oz (53.4 kg)  Height: 5\' 2"  (1.575 m)    PHYSICAL EXAM General: NAD HEENT: Normal. Neck: No JVD, no thyromegaly. Lungs: Clear to auscultation bilaterally with normal respiratory effort. CV: Nondisplaced PMI.  Regular rate and rhythm, normal S1/S2, no S3/S4, 2/6 pansystolic murmur along left sternal border. No pretibial edema.  Abdomen: Soft, nontender, no distention.  Neurologic: Alert and oriented.  Psych: Normal affect. Skin: Normal. Musculoskeletal: No gross deformities.    ECG: Most recent ECG reviewed.      ASSESSMENT AND PLAN: 1. CAD with LAD stent: Continue ASA, metoprolol, and statin.  2. Carotid artery disease: Dopplers reviewed above. Continue ASA and statin.  3. Hyperlipidemia: Continue statin.  4. Palpitations/PSVT: Stable on metoprolol 25 mg BID.  5. Bilateral leg edema: Resolved with cessation of diltiazem and brief course of Lasix.  Dispo: fu 1 year.   Prentice Docker, M.D., F.A.C.C.

## 2016-02-28 NOTE — Patient Instructions (Signed)

## 2016-03-02 DIAGNOSIS — N3001 Acute cystitis with hematuria: Secondary | ICD-10-CM | POA: Diagnosis not present

## 2016-03-02 DIAGNOSIS — Z6820 Body mass index (BMI) 20.0-20.9, adult: Secondary | ICD-10-CM | POA: Diagnosis not present

## 2016-03-02 DIAGNOSIS — R300 Dysuria: Secondary | ICD-10-CM | POA: Diagnosis not present

## 2016-03-02 DIAGNOSIS — M25552 Pain in left hip: Secondary | ICD-10-CM | POA: Diagnosis not present

## 2016-03-04 DIAGNOSIS — M25552 Pain in left hip: Secondary | ICD-10-CM | POA: Diagnosis not present

## 2016-03-09 DIAGNOSIS — M25552 Pain in left hip: Secondary | ICD-10-CM | POA: Diagnosis not present

## 2016-03-16 DIAGNOSIS — S76312D Strain of muscle, fascia and tendon of the posterior muscle group at thigh level, left thigh, subsequent encounter: Secondary | ICD-10-CM | POA: Diagnosis not present

## 2016-03-16 DIAGNOSIS — M25552 Pain in left hip: Secondary | ICD-10-CM | POA: Diagnosis not present

## 2016-03-18 DIAGNOSIS — S76312D Strain of muscle, fascia and tendon of the posterior muscle group at thigh level, left thigh, subsequent encounter: Secondary | ICD-10-CM | POA: Diagnosis not present

## 2016-03-18 DIAGNOSIS — M25552 Pain in left hip: Secondary | ICD-10-CM | POA: Diagnosis not present

## 2016-03-21 DIAGNOSIS — R300 Dysuria: Secondary | ICD-10-CM | POA: Diagnosis not present

## 2016-03-21 DIAGNOSIS — Z6821 Body mass index (BMI) 21.0-21.9, adult: Secondary | ICD-10-CM | POA: Diagnosis not present

## 2016-03-21 DIAGNOSIS — N3001 Acute cystitis with hematuria: Secondary | ICD-10-CM | POA: Diagnosis not present

## 2016-03-25 DIAGNOSIS — S76312D Strain of muscle, fascia and tendon of the posterior muscle group at thigh level, left thigh, subsequent encounter: Secondary | ICD-10-CM | POA: Diagnosis not present

## 2016-03-25 DIAGNOSIS — M25552 Pain in left hip: Secondary | ICD-10-CM | POA: Diagnosis not present

## 2016-03-30 DIAGNOSIS — S76312D Strain of muscle, fascia and tendon of the posterior muscle group at thigh level, left thigh, subsequent encounter: Secondary | ICD-10-CM | POA: Diagnosis not present

## 2016-03-30 DIAGNOSIS — M25552 Pain in left hip: Secondary | ICD-10-CM | POA: Diagnosis not present

## 2016-04-01 DIAGNOSIS — M47816 Spondylosis without myelopathy or radiculopathy, lumbar region: Secondary | ICD-10-CM | POA: Diagnosis not present

## 2016-04-01 DIAGNOSIS — M25552 Pain in left hip: Secondary | ICD-10-CM | POA: Diagnosis not present

## 2016-04-01 DIAGNOSIS — M4716 Other spondylosis with myelopathy, lumbar region: Secondary | ICD-10-CM | POA: Diagnosis not present

## 2016-04-01 DIAGNOSIS — S76312D Strain of muscle, fascia and tendon of the posterior muscle group at thigh level, left thigh, subsequent encounter: Secondary | ICD-10-CM | POA: Diagnosis not present

## 2016-04-02 DIAGNOSIS — M25552 Pain in left hip: Secondary | ICD-10-CM | POA: Diagnosis not present

## 2016-04-02 DIAGNOSIS — S76312D Strain of muscle, fascia and tendon of the posterior muscle group at thigh level, left thigh, subsequent encounter: Secondary | ICD-10-CM | POA: Diagnosis not present

## 2016-04-06 DIAGNOSIS — S76312D Strain of muscle, fascia and tendon of the posterior muscle group at thigh level, left thigh, subsequent encounter: Secondary | ICD-10-CM | POA: Diagnosis not present

## 2016-04-06 DIAGNOSIS — M25552 Pain in left hip: Secondary | ICD-10-CM | POA: Diagnosis not present

## 2016-04-08 DIAGNOSIS — S76312D Strain of muscle, fascia and tendon of the posterior muscle group at thigh level, left thigh, subsequent encounter: Secondary | ICD-10-CM | POA: Diagnosis not present

## 2016-04-08 DIAGNOSIS — M25552 Pain in left hip: Secondary | ICD-10-CM | POA: Diagnosis not present

## 2016-04-13 DIAGNOSIS — Z6821 Body mass index (BMI) 21.0-21.9, adult: Secondary | ICD-10-CM | POA: Diagnosis not present

## 2016-04-13 DIAGNOSIS — R300 Dysuria: Secondary | ICD-10-CM | POA: Diagnosis not present

## 2016-04-13 DIAGNOSIS — M25552 Pain in left hip: Secondary | ICD-10-CM | POA: Diagnosis not present

## 2016-04-13 DIAGNOSIS — N3001 Acute cystitis with hematuria: Secondary | ICD-10-CM | POA: Diagnosis not present

## 2016-04-13 DIAGNOSIS — S76312D Strain of muscle, fascia and tendon of the posterior muscle group at thigh level, left thigh, subsequent encounter: Secondary | ICD-10-CM | POA: Diagnosis not present

## 2016-04-15 DIAGNOSIS — M25552 Pain in left hip: Secondary | ICD-10-CM | POA: Diagnosis not present

## 2016-04-15 DIAGNOSIS — S76812D Strain of other specified muscles, fascia and tendons at thigh level, left thigh, subsequent encounter: Secondary | ICD-10-CM | POA: Diagnosis not present

## 2016-04-20 DIAGNOSIS — M25552 Pain in left hip: Secondary | ICD-10-CM | POA: Diagnosis not present

## 2016-04-20 DIAGNOSIS — S76812D Strain of other specified muscles, fascia and tendons at thigh level, left thigh, subsequent encounter: Secondary | ICD-10-CM | POA: Diagnosis not present

## 2016-04-27 DIAGNOSIS — M25552 Pain in left hip: Secondary | ICD-10-CM | POA: Diagnosis not present

## 2016-04-27 DIAGNOSIS — S76812D Strain of other specified muscles, fascia and tendons at thigh level, left thigh, subsequent encounter: Secondary | ICD-10-CM | POA: Diagnosis not present

## 2016-04-29 DIAGNOSIS — N3020 Other chronic cystitis without hematuria: Secondary | ICD-10-CM | POA: Diagnosis not present

## 2016-04-29 DIAGNOSIS — R300 Dysuria: Secondary | ICD-10-CM | POA: Diagnosis not present

## 2016-04-30 DIAGNOSIS — M25552 Pain in left hip: Secondary | ICD-10-CM | POA: Diagnosis not present

## 2016-04-30 DIAGNOSIS — S76812D Strain of other specified muscles, fascia and tendons at thigh level, left thigh, subsequent encounter: Secondary | ICD-10-CM | POA: Diagnosis not present

## 2016-05-04 DIAGNOSIS — M25552 Pain in left hip: Secondary | ICD-10-CM | POA: Diagnosis not present

## 2016-05-04 DIAGNOSIS — S76812D Strain of other specified muscles, fascia and tendons at thigh level, left thigh, subsequent encounter: Secondary | ICD-10-CM | POA: Diagnosis not present

## 2016-05-18 DIAGNOSIS — E042 Nontoxic multinodular goiter: Secondary | ICD-10-CM | POA: Diagnosis not present

## 2016-05-18 DIAGNOSIS — E890 Postprocedural hypothyroidism: Secondary | ICD-10-CM | POA: Diagnosis not present

## 2016-05-18 DIAGNOSIS — Z9889 Other specified postprocedural states: Secondary | ICD-10-CM | POA: Diagnosis not present

## 2016-05-18 DIAGNOSIS — Z79899 Other long term (current) drug therapy: Secondary | ICD-10-CM | POA: Diagnosis not present

## 2016-06-11 ENCOUNTER — Other Ambulatory Visit: Payer: Self-pay | Admitting: Cardiovascular Disease

## 2016-07-06 DIAGNOSIS — M4716 Other spondylosis with myelopathy, lumbar region: Secondary | ICD-10-CM | POA: Diagnosis not present

## 2016-07-06 DIAGNOSIS — M1612 Unilateral primary osteoarthritis, left hip: Secondary | ICD-10-CM | POA: Diagnosis not present

## 2016-07-06 DIAGNOSIS — M47816 Spondylosis without myelopathy or radiculopathy, lumbar region: Secondary | ICD-10-CM | POA: Diagnosis not present

## 2016-07-06 DIAGNOSIS — M25552 Pain in left hip: Secondary | ICD-10-CM | POA: Diagnosis not present

## 2016-07-22 DIAGNOSIS — M5410 Radiculopathy, site unspecified: Secondary | ICD-10-CM | POA: Diagnosis not present

## 2016-07-22 DIAGNOSIS — M7062 Trochanteric bursitis, left hip: Secondary | ICD-10-CM | POA: Diagnosis not present

## 2016-07-22 DIAGNOSIS — M160 Bilateral primary osteoarthritis of hip: Secondary | ICD-10-CM | POA: Diagnosis not present

## 2016-07-23 DIAGNOSIS — N3001 Acute cystitis with hematuria: Secondary | ICD-10-CM | POA: Diagnosis not present

## 2016-07-23 DIAGNOSIS — R300 Dysuria: Secondary | ICD-10-CM | POA: Diagnosis not present

## 2016-07-23 DIAGNOSIS — Z6821 Body mass index (BMI) 21.0-21.9, adult: Secondary | ICD-10-CM | POA: Diagnosis not present

## 2016-07-27 DIAGNOSIS — Z6822 Body mass index (BMI) 22.0-22.9, adult: Secondary | ICD-10-CM | POA: Diagnosis not present

## 2016-07-27 DIAGNOSIS — R300 Dysuria: Secondary | ICD-10-CM | POA: Diagnosis not present

## 2016-07-27 DIAGNOSIS — J019 Acute sinusitis, unspecified: Secondary | ICD-10-CM | POA: Diagnosis not present

## 2016-08-14 DIAGNOSIS — R509 Fever, unspecified: Secondary | ICD-10-CM | POA: Diagnosis not present

## 2016-08-14 DIAGNOSIS — R05 Cough: Secondary | ICD-10-CM | POA: Diagnosis not present

## 2016-08-14 DIAGNOSIS — Z6821 Body mass index (BMI) 21.0-21.9, adult: Secondary | ICD-10-CM | POA: Diagnosis not present

## 2016-08-19 DIAGNOSIS — M4716 Other spondylosis with myelopathy, lumbar region: Secondary | ICD-10-CM | POA: Diagnosis not present

## 2016-08-19 DIAGNOSIS — M47816 Spondylosis without myelopathy or radiculopathy, lumbar region: Secondary | ICD-10-CM | POA: Diagnosis not present

## 2016-08-31 DIAGNOSIS — H524 Presbyopia: Secondary | ICD-10-CM | POA: Diagnosis not present

## 2016-08-31 DIAGNOSIS — Z961 Presence of intraocular lens: Secondary | ICD-10-CM | POA: Diagnosis not present

## 2016-08-31 DIAGNOSIS — H1851 Endothelial corneal dystrophy: Secondary | ICD-10-CM | POA: Diagnosis not present

## 2016-08-31 DIAGNOSIS — H26493 Other secondary cataract, bilateral: Secondary | ICD-10-CM | POA: Diagnosis not present

## 2016-09-09 DIAGNOSIS — M5410 Radiculopathy, site unspecified: Secondary | ICD-10-CM | POA: Diagnosis not present

## 2016-09-12 ENCOUNTER — Other Ambulatory Visit: Payer: Self-pay | Admitting: Cardiovascular Disease

## 2016-10-08 DIAGNOSIS — Z981 Arthrodesis status: Secondary | ICD-10-CM | POA: Diagnosis not present

## 2016-10-08 DIAGNOSIS — M5117 Intervertebral disc disorders with radiculopathy, lumbosacral region: Secondary | ICD-10-CM | POA: Diagnosis not present

## 2016-10-08 DIAGNOSIS — M5126 Other intervertebral disc displacement, lumbar region: Secondary | ICD-10-CM | POA: Diagnosis not present

## 2016-10-19 DIAGNOSIS — N3001 Acute cystitis with hematuria: Secondary | ICD-10-CM | POA: Diagnosis not present

## 2016-10-19 DIAGNOSIS — Z6820 Body mass index (BMI) 20.0-20.9, adult: Secondary | ICD-10-CM | POA: Diagnosis not present

## 2016-10-19 DIAGNOSIS — J441 Chronic obstructive pulmonary disease with (acute) exacerbation: Secondary | ICD-10-CM | POA: Diagnosis not present

## 2016-10-19 DIAGNOSIS — R300 Dysuria: Secondary | ICD-10-CM | POA: Diagnosis not present

## 2016-10-21 DIAGNOSIS — M79652 Pain in left thigh: Secondary | ICD-10-CM | POA: Diagnosis not present

## 2016-10-21 DIAGNOSIS — M5410 Radiculopathy, site unspecified: Secondary | ICD-10-CM | POA: Diagnosis not present

## 2016-11-13 DIAGNOSIS — M5416 Radiculopathy, lumbar region: Secondary | ICD-10-CM | POA: Diagnosis not present

## 2016-11-13 DIAGNOSIS — M5126 Other intervertebral disc displacement, lumbar region: Secondary | ICD-10-CM | POA: Diagnosis not present

## 2016-11-16 ENCOUNTER — Other Ambulatory Visit: Payer: Self-pay | Admitting: Neurosurgery

## 2016-11-16 DIAGNOSIS — M5126 Other intervertebral disc displacement, lumbar region: Secondary | ICD-10-CM

## 2016-11-23 ENCOUNTER — Other Ambulatory Visit: Payer: Self-pay | Admitting: Neurosurgery

## 2016-11-23 ENCOUNTER — Ambulatory Visit
Admission: RE | Admit: 2016-11-23 | Discharge: 2016-11-23 | Disposition: A | Discharge status: Home / Self Care | Payer: Medicare Other | Source: Ambulatory Visit | Attending: Neurosurgery | Admitting: Neurosurgery

## 2016-11-23 DIAGNOSIS — M5126 Other intervertebral disc displacement, lumbar region: Secondary | ICD-10-CM | POA: Diagnosis not present

## 2016-11-23 MED ORDER — DIAZEPAM 5 MG PO TABS: 5.0000 mg | ORAL_TABLET | Freq: Once | ORAL | Status: DC

## 2016-11-23 MED ORDER — METHYLPREDNISOLONE ACETATE 40 MG/ML INJ SUSP (RADIOLOG
120.0000 mg | Freq: Once | INTRAMUSCULAR | Status: AC
  Administered 2016-11-23: 120 mg via EPIDURAL

## 2016-11-23 MED ORDER — IOPAMIDOL (ISOVUE-M 200) INJECTION 41%
1.0000 mL | Freq: Once | INTRAMUSCULAR | Status: AC
  Administered 2016-11-23: 1 mL via EPIDURAL

## 2016-11-23 NOTE — Discharge Instructions (Signed)

## 2016-12-17 DIAGNOSIS — N3020 Other chronic cystitis without hematuria: Secondary | ICD-10-CM | POA: Diagnosis not present

## 2016-12-17 DIAGNOSIS — N3946 Mixed incontinence: Secondary | ICD-10-CM | POA: Diagnosis not present

## 2016-12-17 DIAGNOSIS — R3914 Feeling of incomplete bladder emptying: Secondary | ICD-10-CM | POA: Diagnosis not present

## 2017-01-07 DIAGNOSIS — N3020 Other chronic cystitis without hematuria: Secondary | ICD-10-CM | POA: Diagnosis not present

## 2017-01-07 DIAGNOSIS — R8271 Bacteriuria: Secondary | ICD-10-CM | POA: Diagnosis not present

## 2017-01-08 DIAGNOSIS — M5126 Other intervertebral disc displacement, lumbar region: Secondary | ICD-10-CM | POA: Diagnosis not present

## 2017-01-11 ENCOUNTER — Other Ambulatory Visit: Payer: Self-pay | Admitting: Neurosurgery

## 2017-01-11 DIAGNOSIS — M5126 Other intervertebral disc displacement, lumbar region: Secondary | ICD-10-CM

## 2017-01-16 ENCOUNTER — Other Ambulatory Visit: Payer: Self-pay | Admitting: Cardiovascular Disease

## 2017-01-18 ENCOUNTER — Other Ambulatory Visit: Payer: Self-pay | Admitting: Neurosurgery

## 2017-01-18 ENCOUNTER — Telehealth: Payer: Self-pay | Admitting: Cardiovascular Disease

## 2017-01-18 ENCOUNTER — Ambulatory Visit
Admission: RE | Admit: 2017-01-18 | Discharge: 2017-01-18 | Disposition: A | Discharge status: Home / Self Care | Payer: Medicare Other | Source: Ambulatory Visit | Attending: Neurosurgery | Admitting: Neurosurgery

## 2017-01-18 DIAGNOSIS — M5126 Other intervertebral disc displacement, lumbar region: Secondary | ICD-10-CM

## 2017-01-18 DIAGNOSIS — M5410 Radiculopathy, site unspecified: Secondary | ICD-10-CM | POA: Diagnosis not present

## 2017-01-18 MED ORDER — IOPAMIDOL (ISOVUE-M 200) INJECTION 41%
1.0000 mL | Freq: Once | INTRAMUSCULAR | Status: AC
  Administered 2017-01-18: 1 mL via EPIDURAL

## 2017-01-18 MED ORDER — METHYLPREDNISOLONE ACETATE 40 MG/ML INJ SUSP (RADIOLOG
120.0000 mg | Freq: Once | INTRAMUSCULAR | Status: AC
  Administered 2017-01-18: 120 mg via EPIDURAL

## 2017-01-18 NOTE — Telephone Encounter (Signed)
Was told by her PCP to contact us about her BP. Stated that her feet are swelling. Has been happening for a couple of weeks now.   147/55  118/79 131/52 118/54

## 2017-01-18 NOTE — Telephone Encounter (Signed)
LMTCB

## 2017-01-18 NOTE — Telephone Encounter (Signed)
When is her fu visit with me? It is coming up on a year since her last visit. I can evaluate her at that time. If it's not for some time, she can take Lasix 20 mg x 3 days.

## 2017-01-19 NOTE — Telephone Encounter (Signed)
Mckenzie [name] called back. Mckenzie [name] will return call.

## 2017-01-19 NOTE — Telephone Encounter (Signed)
Patient notified.  1 year follow up scheduled for 03/03/2017 with Dr. Purvis SheffieldKoneswaran.  She will continue to monitor her BP readings & will call back for worsening symptoms prior to her OV.

## 2017-01-28 DIAGNOSIS — N3020 Other chronic cystitis without hematuria: Secondary | ICD-10-CM | POA: Diagnosis not present

## 2017-02-10 ENCOUNTER — Other Ambulatory Visit: Payer: Self-pay | Admitting: Cardiovascular Disease

## 2017-02-19 DIAGNOSIS — M5416 Radiculopathy, lumbar region: Secondary | ICD-10-CM | POA: Diagnosis not present

## 2017-02-19 DIAGNOSIS — M47816 Spondylosis without myelopathy or radiculopathy, lumbar region: Secondary | ICD-10-CM | POA: Insufficient documentation

## 2017-02-19 DIAGNOSIS — M5126 Other intervertebral disc displacement, lumbar region: Secondary | ICD-10-CM | POA: Diagnosis not present

## 2017-02-19 DIAGNOSIS — M4726 Other spondylosis with radiculopathy, lumbar region: Secondary | ICD-10-CM | POA: Diagnosis not present

## 2017-03-03 ENCOUNTER — Encounter: Payer: Self-pay | Admitting: Cardiovascular Disease

## 2017-03-03 ENCOUNTER — Ambulatory Visit (INDEPENDENT_AMBULATORY_CARE_PROVIDER_SITE_OTHER): Payer: Medicare Other | Admitting: Cardiovascular Disease

## 2017-03-03 VITALS — BP 122/58 | HR 56 | Ht 64.0 in | Wt 116.0 lb

## 2017-03-03 DIAGNOSIS — I25118 Atherosclerotic heart disease of native coronary artery with other forms of angina pectoris: Secondary | ICD-10-CM | POA: Diagnosis not present

## 2017-03-03 DIAGNOSIS — R002 Palpitations: Secondary | ICD-10-CM | POA: Diagnosis not present

## 2017-03-03 DIAGNOSIS — I6523 Occlusion and stenosis of bilateral carotid arteries: Secondary | ICD-10-CM

## 2017-03-03 DIAGNOSIS — I209 Angina pectoris, unspecified: Secondary | ICD-10-CM

## 2017-03-03 DIAGNOSIS — E785 Hyperlipidemia, unspecified: Secondary | ICD-10-CM

## 2017-03-03 DIAGNOSIS — I358 Other nonrheumatic aortic valve disorders: Secondary | ICD-10-CM

## 2017-03-03 DIAGNOSIS — R600 Localized edema: Secondary | ICD-10-CM | POA: Diagnosis not present

## 2017-03-03 MED ORDER — FUROSEMIDE 20 MG PO TABS: ORAL_TABLET | ORAL | 3 refills | Status: DC

## 2017-03-03 NOTE — Patient Instructions (Signed)

## 2017-03-03 NOTE — Progress Notes (Signed)
SUBJECTIVE: The patient presents for follow-up of coronary artery disease. She had an LAD stent placed in 2003 with coronary angiography performed in 2012 which showed patency of the stent.   Carotid artery Dopplers on 04/01/15 showed less than 40% stenosis bilaterally.  Echocardiogram 08/06/14 showed normal left ventricular systolic function, EF 65%, mild concentric LVH, and diastolic dysfunction.  She denies chest pain, dizziness, and shortness of breath. She denies orthopnea and paroxysmal nocturnal dyspnea. She seldom has palpitations.  Her primary complaint relates to bilateral ankle and feet swelling.  ECG performed in the office today which I ordered and personally interpreted demonstrated sinus bradycardia with a right bundle branch block, 53 bpm.   Review of Systems: As per "subjective", otherwise negative.  Allergies  Allergen Reactions  . Sulfamethoxazole-Trimethoprim Nausea Only  . Tetracycline Nausea Only    Current Outpatient Prescriptions  Medication Sig Dispense Refill  . albuterol (PROVENTIL HFA;VENTOLIN HFA) 108 (90 BASE) MCG/ACT inhaler Inhale 2 puffs into the lungs every 6 (six) hours as needed for wheezing.    Marland Kitchen aspirin 81 MG tablet Take 81 mg by mouth daily.    Marland Kitchen atorvastatin (LIPITOR) 10 MG tablet Take 10 mg by mouth daily.     . benazepril (LOTENSIN) 5 MG tablet Take 5 mg by mouth daily.     . calcium-vitamin D (OSCAL WITH D) 500-200 MG-UNIT per tablet Take 1 tablet by mouth daily.    . Cholecalciferol (VITAMIN D3) 1000 UNITS CAPS Take 1,000 Units by mouth daily. Take one tab daily    . diphenhydrAMINE (BENADRYL) 25 mg capsule Take 25 mg by mouth daily as needed for sleep.     Marland Kitchen docusate sodium (COLACE) 100 MG capsule 100 mg. Take 2 tabs twice a day     . esomeprazole (NEXIUM) 40 MG capsule Take 40 mg by mouth daily before breakfast.     . Ferrous Gluconate (IRON) 240 (27 FE) MG TABS Take 1 tablet by mouth daily.    . folic acid (FOLVITE) 800 MCG  tablet Take 800 mcg by mouth daily.    . furosemide (LASIX) 20 MG tablet TAKE 1 TABLET DAILY AS NEEDED FOR EDEMA -MAY TAKE IN ADDITION TO DAILY DOSE ONLY AS NEEDED- 30 tablet 1  . hyoscyamine (LEVSIN SL) 0.125 MG SL tablet Take 0.125 mg by mouth every 6 (six) hours as needed for cramping or diarrhea or loose stools.     Marland Kitchen levothyroxine (SYNTHROID, LEVOTHROID) 75 MCG tablet Take 75 mcg by mouth daily before breakfast.     . MELATONIN PO Take 1 tablet by mouth at bedtime.    . metoprolol tartrate (LOPRESSOR) 25 MG tablet Take 1 tablet (25 mg total) by mouth 2 (two) times daily. 180 tablet 3  . Multiple Vitamin (MULTIVITAMIN) tablet Take 1 tablet by mouth daily.     . nitrofurantoin (MACRODANTIN) 100 MG capsule Take 100 mg by mouth at bedtime.     . nitroGLYCERIN (NITROSTAT) 0.4 MG SL tablet Place one tablet under tongue every 5 minutes up to 3 doses as needed for chest pain. No more than 3 doses over a 15 minute period. 100 tablet 0  . OxyCODONE (OXYCONTIN) 15 mg T12A 12 hr tablet Take 15 mg by mouth as needed (breakthrough pain).    . OxyCODONE HCl ER (OXYCONTIN) 30 MG T12A Take 30 mg by mouth.    . polyethylene glycol powder (GLYCOLAX/MIRALAX) powder Take 17 g by mouth daily as needed (Constipation).     Marland Kitchen  potassium chloride SA (K-DUR,KLOR-CON) 20 MEQ tablet Take 1 tablet (20 mEq total) by mouth daily. 3 tablet 0  . raloxifene (EVISTA) 60 MG tablet Take 60 mg by mouth daily.     Marland Kitchen SPIRIVA HANDIHALER 18 MCG inhalation capsule Place 18 mcg into inhaler and inhale daily.     . Tamsulosin HCl (FLOMAX) 0.4 MG CAPS Take 0.4 mg by mouth daily. Take 1 capsule by mouth daily.    . temazepam (RESTORIL) 30 MG capsule Take 30 mg by mouth at bedtime.     . tretinoin (RETIN-A) 0.05 % cream Apply 1 application topically at bedtime.      No current facility-administered medications for this visit.     Past Medical History:  Diagnosis Date  . Aortic valve sclerosis    Mild, echo, April, 2012  . ARDS (adult  respiratory distress syndrome) (HCC) 2003   with tracheostomy-peritonitis  . Arthritis   . Back pain    Need for surgery, Dr. Trey Sailors  . CAD (coronary artery disease)    Stent to LAD, 2003 [facility] / nuclear scan April, 201 to artifact / persistent shortness of breath / catheterization Oct 21, 2010... widely patent stent to the mid LAD, no significant obstructive disease, should be low risk    for back surgery  . Carotid arterial disease (HCC)    Moderate-Dr. Myra Gianotti following  . Chronic back pain   . COPD (chronic obstructive pulmonary disease) (HCC)   . Diastolic dysfunction    Echo, April, 2011   EF 65%  . Dysrhythmia    afib  . Ejection fraction    EF 65%, echo, September 15, 2010  . GERD (gastroesophageal reflux disease)   . Hemolytic anemia (HCC)    Followed by Dr. Derald Macleod  . Hypertension   . Hypothyroidism   . Left ventricular hypertrophy    EF 65%, echo, April, 2011  . MR (mitral regurgitation)     Mild echo, 2012 ///  Mild per Echo 2009,  mild, echo, April, 2011  . Myocardial infarction (HCC)   . Palpitations    Holter, September 16, 2010, PACs and PVCs,one 4 beat run of SVT  . Pneumonia 11-2011  . Preoperative clearance    Preop clearance for back surgery  . Shortness of breath    March, 2012  . UTI (urinary tract infection)    Chronic-on Macrodantin    Past Surgical History:  Procedure Laterality Date  . APPENDECTOMY    . BACK SURGERY    . BREAST LUMPECTOMY Left   . CATARACT EXTRACTION W/PHACO Right 12/05/2012   Procedure: CATARACT EXTRACTION PHACO AND INTRAOCULAR LENS PLACEMENT (IOC);  Surgeon: Gemma Payor, MD;  Location: [facility];  Service: Ophthalmology;  Laterality: Right;  CDE:15.30  . CATARACT EXTRACTION W/PHACO Left 12/15/2012   Procedure: CATARACT EXTRACTION PHACO AND INTRAOCULAR LENS PLACEMENT (IOC);  Surgeon: Gemma Payor, MD;  Location: [facility];  Service: Ophthalmology;  Laterality: Left;  CDE:22.56  . CORONARY ANGIOPLASTY WITH STENT PLACEMENT  2003  . repair of  ruptured lumbar discs  12-2011  . SPINE SURGERY     lumbar [facility]. [facility] Brim [facility]  . THYROID SURGERY  08/2009   Benign adenomas  . TONSILLECTOMY    . TRACHEOSTOMY  2003   Following ARDS     Social History   Social History  . Marital status: Married    Spouse name: N/A  . Number of children: N/A  . Years of education: N/A  Occupational History  . Retired    Social History Main Topics  . Smoking status: Former Smoker    Packs/day: 1.00    Years: 20.00    Types: Cigarettes    Quit date: 06/15/2000  . Smokeless tobacco: Never Used  . Alcohol use No  . Drug use: No  . Sexual activity: Not on file   Other Topics Concern  . Not on file   Social History Narrative   Regular Exercise: Yes-Walk     Vitals:   03/03/17 1510  BP: (!) 122/58  Pulse: (!) 56  SpO2: 98%  Weight: 116 lb (52.6 kg)  Height:  (1.626 m)    Wt Readings from Last 3 Encounters:  03/03/17 116 lb (52.6 kg)  02/28/16 117 lb 12.8 oz (53.4 kg)  10/24/15 122 lb (55.3 kg)     PHYSICAL EXAM General: NAD HEENT: Normal. Neck: No JVD, no thyromegaly. Lungs: Clear to auscultation bilaterally with normal respiratory effort. CV: Nondisplaced PMI.  Bradycardic, regular rhythm, normal S1/S2, no S3/S4, 2/6 pansystolic murmur along left sternal border. Trace bilateral lower pretibial and dorsal pedal edema.  No carotid bruit.   Abdomen: Soft, nontender, no distention.  Neurologic: Alert and oriented.  Psych: Normal affect. Skin: Normal. Musculoskeletal: No gross deformities.    ECG: Most recent ECG reviewed.   Labs: Lab Results  Component Value Date/Time   K 3.9 11/29/2012 10:16 AM   BUN 19 11/29/2012 10:16 AM   CREATININE 0.73 11/29/2012 10:16 AM   HGB 12.4 11/29/2012 10:16 AM     Lipids: No results found for: LDLCALC, LDLDIRECT, CHOL, TRIG, HDL     ASSESSMENT AND PLAN:  1. CAD with LAD stent: Continue ASA, metoprolol, and statin.  2. Carotid artery  disease: Dopplers reviewed above. Continue ASA and statin.  3. Hyperlipidemia: Continue statin.  4. Palpitations/PSVT: Stable on metoprolol 25 mg BID.  5. Murmur: No significant valvular pathology by echocardiogram in 2016. There was some aortic valve sclerosis.  6. Bilateral leg and dorsal pedal edema: This is likely due to venous insufficiency. I recommended compression stockings but she has a difficult time using them. She will use Lasix as needed. Leg elevation is also recommended.     Disposition: Follow up 1 yr.   Prentice Docker, M.D., F.A.C.C.

## 2017-03-16 DIAGNOSIS — R609 Edema, unspecified: Secondary | ICD-10-CM | POA: Diagnosis not present

## 2017-03-16 DIAGNOSIS — M545 Low back pain: Secondary | ICD-10-CM | POA: Diagnosis not present

## 2017-03-16 DIAGNOSIS — Z6820 Body mass index (BMI) 20.0-20.9, adult: Secondary | ICD-10-CM | POA: Diagnosis not present

## 2017-03-16 DIAGNOSIS — Z23 Encounter for immunization: Secondary | ICD-10-CM | POA: Diagnosis not present

## 2017-03-24 DIAGNOSIS — I872 Venous insufficiency (chronic) (peripheral): Secondary | ICD-10-CM | POA: Diagnosis not present

## 2017-03-24 DIAGNOSIS — Z6820 Body mass index (BMI) 20.0-20.9, adult: Secondary | ICD-10-CM | POA: Diagnosis not present

## 2017-03-24 DIAGNOSIS — M25522 Pain in left elbow: Secondary | ICD-10-CM | POA: Diagnosis not present

## 2017-03-24 DIAGNOSIS — Z23 Encounter for immunization: Secondary | ICD-10-CM | POA: Diagnosis not present

## 2017-04-01 ENCOUNTER — Institutional Professional Consult (permissible substitution): Payer: Medicare Other | Admitting: Internal Medicine

## 2017-04-05 ENCOUNTER — Ambulatory Visit (INDEPENDENT_AMBULATORY_CARE_PROVIDER_SITE_OTHER): Payer: Medicare Other | Admitting: Family

## 2017-04-05 ENCOUNTER — Ambulatory Visit (HOSPITAL_COMMUNITY)
Admission: RE | Admit: 2017-04-05 | Discharge: 2017-04-05 | Disposition: A | Discharge status: Home / Self Care | Payer: Medicare Other | Source: Ambulatory Visit | Attending: Family | Admitting: Family

## 2017-04-05 ENCOUNTER — Encounter: Payer: Self-pay | Admitting: Family

## 2017-04-05 VITALS — BP 131/59 | HR 45 | Temp 98.3°F | Resp 16 | Ht 64.0 in | Wt 120.6 lb

## 2017-04-05 DIAGNOSIS — I6523 Occlusion and stenosis of bilateral carotid arteries: Secondary | ICD-10-CM

## 2017-04-05 LAB — VAS US CAROTID
LEFT ECA DIAS: 0 cm/s
LEFT VERTEBRAL DIAS: -13 cm/s
Left CCA dist dias: 15 cm/s
Left CCA dist sys: 76 cm/s
Left CCA prox dias: 15 cm/s
Left CCA prox sys: 94 cm/s
Left ICA dist dias: -24 cm/s
Left ICA dist sys: -160 cm/s
Left ICA prox dias: -17 cm/s
Left ICA prox sys: -82 cm/s
RIGHT CCA MID DIAS: 13 cm/s
RIGHT ECA DIAS: 0 cm/s
RIGHT VERTEBRAL DIAS: 9 cm/s
Right CCA prox dias: 9 cm/s
Right CCA prox sys: 93 cm/s
Right cca dist sys: -107 cm/s

## 2017-04-05 NOTE — Patient Instructions (Signed)
Stroke Prevention Some medical conditions and behaviors are associated with an increased chance of having a stroke. You may prevent a stroke by making healthy choices and managing medical conditions. How can I reduce my risk of having a stroke?  Stay physically active. Get at least 30 minutes of activity on most or all days.  Do not smoke. It may also be helpful to avoid exposure to secondhand smoke.  Limit alcohol use. Moderate alcohol use is considered to be:  No more than 2 drinks per day for men.  No more than 1 drink per day for nonpregnant women.  Eat healthy foods. This involves:  Eating 5 or more servings of fruits and vegetables a day.  Making dietary changes that address high blood pressure (hypertension), high cholesterol, diabetes, or obesity.  Manage your cholesterol levels.  Making food choices that are high in fiber and low in saturated fat, trans fat, and cholesterol may control cholesterol levels.  Take any prescribed medicines to control cholesterol as directed by your health care provider.  Manage your diabetes.  Controlling your carbohydrate and sugar intake is recommended to manage diabetes.  Take any prescribed medicines to control diabetes as directed by your health care provider.  Control your hypertension.  Making food choices that are low in salt (sodium), saturated fat, trans fat, and cholesterol is recommended to manage hypertension.  Ask your health care provider if you need treatment to lower your blood pressure. Take any prescribed medicines to control hypertension as directed by your health care provider.  If you are 18-39 years of age, have your blood pressure checked every 3-5 years. If you are 40 years of age or older, have your blood pressure checked every year.  Maintain a healthy weight.  Reducing calorie intake and making food choices that are low in sodium, saturated fat, trans fat, and cholesterol are recommended to manage  weight.  Stop drug abuse.  Avoid taking birth control pills.  Talk to your health care provider about the risks of taking birth control pills if you are over 35 years old, smoke, get migraines, or have ever had a blood clot.  Get evaluated for sleep disorders (sleep apnea).  Talk to your health care provider about getting a sleep evaluation if you snore a lot or have excessive sleepiness.  Take medicines only as directed by your health care provider.  For some people, aspirin or blood thinners (anticoagulants) are helpful in reducing the risk of forming abnormal blood clots that can lead to stroke. If you have the irregular heart rhythm of atrial fibrillation, you should be on a blood thinner unless there is a good reason you cannot take them.  Understand all your medicine instructions.  Make sure that other conditions (such as anemia or atherosclerosis) are addressed. Get help right away if:  You have sudden weakness or numbness of the face, arm, or leg, especially on one side of the body.  Your face or eyelid droops to one side.  You have sudden confusion.  You have trouble speaking (aphasia) or understanding.  You have sudden trouble seeing in one or both eyes.  You have sudden trouble walking.  You have dizziness.  You have a loss of balance or coordination.  You have a sudden, severe headache with no known cause.  You have new chest pain or an irregular heartbeat. Any of these symptoms may represent a serious problem that is an emergency. Do not wait to see if the symptoms will go away.   Get medical help at once. Call your local emergency services (911 in [place] Do not drive yourself to the hospital. This information is not intended to replace advice given to you by your health care provider. Make sure you discuss any questions you have with your health care provider. Document Released: 07/09/2004 Document Revised: 11/07/2015 Document Reviewed: 12/02/2012 Elsevier  Interactive Patient Education  2017 [employer]  

## 2017-04-05 NOTE — Progress Notes (Signed)
Chief Complaint: Follow up Extracranial Carotid Artery Stenosis   History of Present Illness  Mckenzie [name] is a 81 y.o. female patient of Dr. Myra GianottiBrabham who has known carotid artery stenosis. This was originally detected by auscultation of a carotid bruit. At that time duplex revealed 50-69% stenosis in both carotid arteries. This was in 2010. She has been asymptomatic the entire time. Had surgery for 4 lumbar HNP's and has severe left sciatic pain. Patient also reports severe osteoporosis in her spine, is under treatment for this.  She has a hx of  hemolytic anemia. [facility] Dr. Remonia Richterevarsky is her hematologist in [place].  Pt had an MI in 2002 during hospitalization for ruptured appendix. She had cardiac stent placed in 2003. She has a known aortic valve murmur, her cardiologist was Dr. Myrtis SerKatz. Patient denies ever having a stroke or TIA. Specifically the patient denies a history of amaurosis fugax or monocular blindness, unilateral facial drooping, hemiplegia, or receptive or expressive aphasia.   She had lumbar spine surgery in 2015.  Pt Diabetic: No Pt smoker: former smoker, quit 2002  Pt meds include: Statin : Yes ASA: yes Other anticoagulants/antiplatelets: no   Past Medical History:  Diagnosis Date  . Aortic valve sclerosis    Mild, echo, April, 2012  . ARDS (adult respiratory distress syndrome) (HCC) 2003   with tracheostomy-peritonitis  . Arthritis   . Back pain    Need for surgery, Dr. Trey SailorsMark Roy  . CAD (coronary artery disease)    Stent to LAD, 2003 [facility] / nuclear scan April, 201 to artifact / persistent shortness of breath / catheterization Oct 21, 2010... widely patent stent to the mid LAD, no significant obstructive disease, should be low risk    for back surgery  . Carotid arterial disease (HCC)    Moderate-Dr. Myra GianottiBrabham following  . Chronic back pain   . COPD (chronic obstructive pulmonary disease) (HCC)   . Diastolic dysfunction    Echo, April, 2011   EF 65%  . Dysrhythmia    afib  . Ejection fraction    EF 65%, echo, September 15, 2010  . GERD (gastroesophageal reflux disease)   . Hemolytic anemia (HCC)    Followed by Dr. Derald Macleodarvosky  . Hypertension   . Hypothyroidism   . Left ventricular hypertrophy    EF 65%, echo, April, 2011  . MR (mitral regurgitation)     Mild echo, 2012 ///  Mild per Echo 2009,  mild, echo, April, 2011  . Myocardial infarction (HCC)   . Palpitations    Holter, September 16, 2010, PACs and PVCs,one 4 beat run of SVT  . Pneumonia 11-2011  . Preoperative clearance    Preop clearance for back surgery  . Shortness of breath    March, 2012  . UTI (urinary tract infection)    Chronic-on Macrodantin    Social History Social History  Substance Use Topics  . Smoking status: Former Smoker    Packs/day: 1.00    Years: 20.00    Types: Cigarettes    Quit date: 06/15/2000  . Smokeless tobacco: Never Used  . Alcohol use No    Family History Family History  Problem Relation Age of Onset  . COPD Other     Surgical History Past Surgical History:  Procedure Laterality Date  . APPENDECTOMY    . BACK SURGERY    . BREAST LUMPECTOMY Left   . CATARACT EXTRACTION W/PHACO Right 12/05/2012   Procedure: CATARACT EXTRACTION PHACO  AND INTRAOCULAR LENS PLACEMENT (IOC);  Surgeon: Gemma Payor, MD;  Location: [facility];  Service: Ophthalmology;  Laterality: Right;  CDE:15.30  . CATARACT EXTRACTION W/PHACO Left 12/15/2012   Procedure: CATARACT EXTRACTION PHACO AND INTRAOCULAR LENS PLACEMENT (IOC);  Surgeon: Gemma Payor, MD;  Location: [facility];  Service: Ophthalmology;  Laterality: Left;  CDE:22.56  . CORONARY ANGIOPLASTY WITH STENT PLACEMENT  2003  . repair of ruptured lumbar discs  12-2011  . SPINE SURGERY     lumbar disk removal by Dr. Laurian [facility] at [facility]  . THYROID SURGERY  08/2009   Benign adenomas  . TONSILLECTOMY    . TRACHEOSTOMY  2003   Following ARDS     Allergies  Allergen Reactions  .  Sulfamethoxazole-Trimethoprim Nausea Only  . Tetracycline Nausea Only    Current Outpatient Prescriptions  Medication Sig Dispense Refill  . albuterol (PROVENTIL HFA;VENTOLIN HFA) 108 (90 BASE) MCG/ACT inhaler Inhale 2 puffs into the lungs every 6 (six) hours as needed for wheezing.    Marland Kitchen aspirin 81 MG tablet Take 81 mg by mouth daily.    Marland Kitchen atorvastatin (LIPITOR) 10 MG tablet Take 10 mg by mouth daily.     . benazepril (LOTENSIN) 5 MG tablet Take 5 mg by mouth daily.     . calcium-vitamin D (OSCAL WITH D) 500-200 MG-UNIT per tablet Take 1 tablet by mouth daily.    . Cholecalciferol (VITAMIN D3) 1000 UNITS CAPS Take 1,000 Units by mouth daily. Take one tab daily    . diphenhydrAMINE (BENADRYL) 25 mg capsule Take 25 mg by mouth daily as needed for sleep.     Marland Kitchen esomeprazole (NEXIUM) 40 MG capsule Take 40 mg by mouth daily before breakfast.     . Ferrous Gluconate (IRON) 240 (27 FE) MG TABS Take 1 tablet by mouth daily.    . folic acid (FOLVITE) 800 MCG tablet Take 800 mcg by mouth daily.    . furosemide (LASIX) 20 MG tablet TAKE 1 TABLET DAILY AS NEEDED FOR EDEMA -MAY TAKE IN ADDITION TO DAILY DOSE ONLY AS NEEDED- 135 tablet 3  . hyoscyamine (LEVSIN SL) 0.125 MG SL tablet Take 0.125 mg by mouth every 6 (six) hours as needed for cramping or diarrhea or loose stools.     Marland Kitchen levothyroxine (SYNTHROID, LEVOTHROID) 75 MCG tablet Take 75 mcg by mouth daily before breakfast.     . MELATONIN PO Take 1 tablet by mouth at bedtime.    . Multiple Vitamin (MULTIVITAMIN) tablet Take 1 tablet by mouth daily.     . nitroGLYCERIN (NITROSTAT) 0.4 MG SL tablet Place one tablet under tongue every 5 minutes up to 3 doses as needed for chest pain. No more than 3 doses over a 15 minute period. 100 tablet 0  . OxyCODONE (OXYCONTIN) 15 mg T12A 12 hr tablet Take 15 mg by mouth as needed (breakthrough pain).    . OxyCODONE HCl ER (OXYCONTIN) 30 MG T12A Take 30 mg by mouth.    . polyethylene glycol powder (GLYCOLAX/MIRALAX)  powder Take 17 g by mouth daily as needed (Constipation).     . potassium chloride SA (K-DUR,KLOR-CON) 20 MEQ tablet Take 1 tablet (20 mEq total) by mouth daily. 3 tablet 0  . raloxifene (EVISTA) 60 MG tablet Take 60 mg by mouth daily.     Marland Kitchen SPIRIVA HANDIHALER 18 MCG inhalation capsule Place 18 mcg into inhaler and inhale daily.     . Tamsulosin HCl (FLOMAX) 0.4 MG CAPS Take 0.4 mg by mouth daily.  Take 1 capsule by mouth daily.    . temazepam (RESTORIL) 30 MG capsule Take 30 mg by mouth at bedtime.     . tretinoin (RETIN-A) 0.05 % cream Apply 1 application topically at bedtime.      No current facility-administered medications for this visit.     Review of Systems : See HPI for pertinent positives and negatives.  Physical Examination  Vitals:   04/05/17 1515 04/05/17 1521  BP: (!) 131/52 (!) 131/59  Pulse: (!) 45   Resp: 16   Temp: 98.3 F (36.8 C)   TempSrc: Oral   SpO2: 95%   Weight: 120 lb 9.6 oz (54.7 kg)   Height: 5\' 4"  (1.626 m)    Body mass index is 20.7 kg/m.  General: WDWN female in NAD GAIT: antalgic Eyes: PERRLA Pulmonary: Respirations are non labored, CTAB, no rales, no rhonchi, & no wheezing.  Cardiac: regular rhythm, positive murmur.  VASCULAR EXAM Carotid Bruits Left Right   Transmitted cardiac murmur Transmitted cardiac murmur    Abdominal aortic pulse is not palpable. Radial pulses are 2+ palpable and equal.      LE Pulses LEFT RIGHT   FEMORAL  palpable  palpable    POPLITEAL not palpable  not palpable   POSTERIOR TIBIAL  palpable   palpable    DORSALIS PEDIS  ANTERIOR TIBIAL palpable  palpable     Gastrointestinal: soft, nontender, BS WNL, no r/g,no palpable masses.  Musculoskeletal: No muscle atrophy/wasting. M/S 5/5 throughout, Extremities without  ischemic changes.  Neurologic: A&O X 3; Appropriate Affect ; SENSATION ;normal; speech is normal, CN 2-12 intact except, Pain and light touch intact in extremities, Motor exam as listed above    Assessment: CORAL TIMME is a 81 y.o. female who has no history of stroke or TIA. She has a cardiac murmur that is transmitted to both carotid arteries.   DATA Carotid Duplex (04/05/17): 1-39% bilateral internal carotid stenosis. Bilateral vertebral artery flow is antegrade.  Bilateral subclavian artery waveforms are normal.  No significant change from 03/27/2013 and 04/01/15.    Plan: Follow-up in 3 years with Carotid Duplex scan. Pt requested 3 years instead of 2 years follow up; this is certainly reasonable given her ICA stenosis has remained no greater than 1-39%.     I discussed in depth with the patient the nature of atherosclerosis, and emphasized the importance of maximal medical management including strict control of blood pressure, blood glucose, and lipid levels, obtaining regular exercise, and continued cessation of smoking.  The patient is aware that without maximal medical management the underlying atherosclerotic disease process will progress, limiting the benefit of any interventions. The patient was given information about stroke prevention and what symptoms should prompt the patient to seek immediate medical care. Thank you for allowing [place] to participate in this patient's care.  [name], RN, MSN, FNP-C [facility] Office: [phone]  Clinic Physician: Myra Gianotti  04/05/17 3:35 PM

## 2017-04-06 DIAGNOSIS — M7989 Other specified soft tissue disorders: Secondary | ICD-10-CM | POA: Diagnosis not present

## 2017-04-06 DIAGNOSIS — R600 Localized edema: Secondary | ICD-10-CM | POA: Diagnosis not present

## 2017-04-07 ENCOUNTER — Other Ambulatory Visit: Payer: Self-pay | Admitting: Cardiovascular Disease

## 2017-04-14 DIAGNOSIS — M79605 Pain in left leg: Secondary | ICD-10-CM | POA: Diagnosis not present

## 2017-04-14 DIAGNOSIS — G894 Chronic pain syndrome: Secondary | ICD-10-CM | POA: Diagnosis not present

## 2017-04-14 DIAGNOSIS — Z79899 Other long term (current) drug therapy: Secondary | ICD-10-CM | POA: Diagnosis not present

## 2017-04-14 DIAGNOSIS — M961 Postlaminectomy syndrome, not elsewhere classified: Secondary | ICD-10-CM | POA: Diagnosis not present

## 2017-04-14 DIAGNOSIS — Z79891 Long term (current) use of opiate analgesic: Secondary | ICD-10-CM | POA: Diagnosis not present

## 2017-04-19 ENCOUNTER — Telehealth: Payer: Self-pay

## 2017-04-19 NOTE — Telephone Encounter (Signed)
Patient contacted office stating pain clinic was taking her off of morphine 15 mg. Patient stated she was informed one of the side effects was rapid heartbeat. Patient states this really worries her and wants to know if Dr. Purvis SheffieldKoneswaran can prescribed her a different pain medication. Advised patient our dr would not be able to prescribe that due to it not being cardiology related. Patient verbalized understanding. Advised patient if her heart rate does get above 100 to give our office a call and we would let the dr know. Patient verbalized understanding and states she has a way to check at home.

## 2017-05-12 DIAGNOSIS — M961 Postlaminectomy syndrome, not elsewhere classified: Secondary | ICD-10-CM | POA: Diagnosis not present

## 2017-06-01 DIAGNOSIS — M5126 Other intervertebral disc displacement, lumbar region: Secondary | ICD-10-CM | POA: Diagnosis not present

## 2017-06-01 DIAGNOSIS — M47816 Spondylosis without myelopathy or radiculopathy, lumbar region: Secondary | ICD-10-CM | POA: Diagnosis not present

## 2017-06-09 DIAGNOSIS — F4542 Pain disorder with related psychological factors: Secondary | ICD-10-CM | POA: Diagnosis not present

## 2017-06-09 DIAGNOSIS — M961 Postlaminectomy syndrome, not elsewhere classified: Secondary | ICD-10-CM | POA: Diagnosis not present

## 2017-06-09 DIAGNOSIS — G894 Chronic pain syndrome: Secondary | ICD-10-CM | POA: Diagnosis not present

## 2017-06-16 DIAGNOSIS — M79605 Pain in left leg: Secondary | ICD-10-CM | POA: Diagnosis not present

## 2017-06-16 DIAGNOSIS — M961 Postlaminectomy syndrome, not elsewhere classified: Secondary | ICD-10-CM | POA: Diagnosis not present

## 2017-06-16 DIAGNOSIS — M545 Low back pain: Secondary | ICD-10-CM | POA: Diagnosis not present

## 2017-06-17 DIAGNOSIS — H6121 Impacted cerumen, right ear: Secondary | ICD-10-CM | POA: Diagnosis not present

## 2017-06-17 DIAGNOSIS — J209 Acute bronchitis, unspecified: Secondary | ICD-10-CM | POA: Diagnosis not present

## 2017-06-17 DIAGNOSIS — J019 Acute sinusitis, unspecified: Secondary | ICD-10-CM | POA: Diagnosis not present

## 2017-06-17 DIAGNOSIS — Z6820 Body mass index (BMI) 20.0-20.9, adult: Secondary | ICD-10-CM | POA: Diagnosis not present

## 2017-07-08 DIAGNOSIS — M79605 Pain in left leg: Secondary | ICD-10-CM | POA: Diagnosis not present

## 2017-07-08 DIAGNOSIS — Z79899 Other long term (current) drug therapy: Secondary | ICD-10-CM | POA: Diagnosis not present

## 2017-07-08 DIAGNOSIS — M961 Postlaminectomy syndrome, not elsewhere classified: Secondary | ICD-10-CM | POA: Diagnosis not present

## 2017-07-08 DIAGNOSIS — G894 Chronic pain syndrome: Secondary | ICD-10-CM | POA: Diagnosis not present

## 2017-07-08 DIAGNOSIS — M545 Low back pain: Secondary | ICD-10-CM | POA: Diagnosis not present

## 2017-07-08 DIAGNOSIS — Z79891 Long term (current) use of opiate analgesic: Secondary | ICD-10-CM | POA: Diagnosis not present

## 2017-07-19 DIAGNOSIS — G894 Chronic pain syndrome: Secondary | ICD-10-CM | POA: Diagnosis not present

## 2017-07-19 DIAGNOSIS — M545 Low back pain: Secondary | ICD-10-CM | POA: Diagnosis not present

## 2017-07-19 DIAGNOSIS — M79605 Pain in left leg: Secondary | ICD-10-CM | POA: Diagnosis not present

## 2017-07-19 DIAGNOSIS — M961 Postlaminectomy syndrome, not elsewhere classified: Secondary | ICD-10-CM | POA: Diagnosis not present

## 2017-07-30 ENCOUNTER — Other Ambulatory Visit: Payer: Self-pay | Admitting: Cardiovascular Disease

## 2017-08-07 DIAGNOSIS — J019 Acute sinusitis, unspecified: Secondary | ICD-10-CM | POA: Diagnosis not present

## 2017-08-07 DIAGNOSIS — Z6820 Body mass index (BMI) 20.0-20.9, adult: Secondary | ICD-10-CM | POA: Diagnosis not present

## 2017-08-07 DIAGNOSIS — N3001 Acute cystitis with hematuria: Secondary | ICD-10-CM | POA: Diagnosis not present

## 2017-08-16 DIAGNOSIS — R300 Dysuria: Secondary | ICD-10-CM | POA: Diagnosis not present

## 2017-08-23 DIAGNOSIS — M5416 Radiculopathy, lumbar region: Secondary | ICD-10-CM | POA: Diagnosis not present

## 2017-08-23 DIAGNOSIS — M5126 Other intervertebral disc displacement, lumbar region: Secondary | ICD-10-CM | POA: Diagnosis not present

## 2017-08-31 DIAGNOSIS — Z79899 Other long term (current) drug therapy: Secondary | ICD-10-CM | POA: Diagnosis not present

## 2017-08-31 DIAGNOSIS — M961 Postlaminectomy syndrome, not elsewhere classified: Secondary | ICD-10-CM | POA: Diagnosis not present

## 2017-08-31 DIAGNOSIS — I080 Rheumatic disorders of both mitral and aortic valves: Secondary | ICD-10-CM | POA: Diagnosis not present

## 2017-08-31 DIAGNOSIS — J449 Chronic obstructive pulmonary disease, unspecified: Secondary | ICD-10-CM | POA: Diagnosis not present

## 2017-08-31 DIAGNOSIS — Z7982 Long term (current) use of aspirin: Secondary | ICD-10-CM | POA: Diagnosis not present

## 2017-08-31 DIAGNOSIS — Z885 Allergy status to narcotic agent status: Secondary | ICD-10-CM | POA: Diagnosis not present

## 2017-08-31 DIAGNOSIS — E785 Hyperlipidemia, unspecified: Secondary | ICD-10-CM | POA: Diagnosis not present

## 2017-08-31 DIAGNOSIS — Z955 Presence of coronary angioplasty implant and graft: Secondary | ICD-10-CM | POA: Diagnosis not present

## 2017-08-31 DIAGNOSIS — Z881 Allergy status to other antibiotic agents status: Secondary | ICD-10-CM | POA: Diagnosis not present

## 2017-08-31 DIAGNOSIS — M79605 Pain in left leg: Secondary | ICD-10-CM | POA: Diagnosis not present

## 2017-08-31 DIAGNOSIS — M810 Age-related osteoporosis without current pathological fracture: Secondary | ICD-10-CM | POA: Diagnosis not present

## 2017-08-31 DIAGNOSIS — M545 Low back pain: Secondary | ICD-10-CM | POA: Diagnosis not present

## 2017-08-31 DIAGNOSIS — I10 Essential (primary) hypertension: Secondary | ICD-10-CM | POA: Diagnosis not present

## 2017-08-31 DIAGNOSIS — Z87891 Personal history of nicotine dependence: Secondary | ICD-10-CM | POA: Diagnosis not present

## 2017-08-31 DIAGNOSIS — I2510 Atherosclerotic heart disease of native coronary artery without angina pectoris: Secondary | ICD-10-CM | POA: Diagnosis not present

## 2017-08-31 DIAGNOSIS — E039 Hypothyroidism, unspecified: Secondary | ICD-10-CM | POA: Diagnosis not present

## 2017-08-31 DIAGNOSIS — K219 Gastro-esophageal reflux disease without esophagitis: Secondary | ICD-10-CM | POA: Diagnosis not present

## 2017-08-31 DIAGNOSIS — Z8673 Personal history of transient ischemic attack (TIA), and cerebral infarction without residual deficits: Secondary | ICD-10-CM | POA: Diagnosis not present

## 2017-08-31 DIAGNOSIS — Z7952 Long term (current) use of systemic steroids: Secondary | ICD-10-CM | POA: Diagnosis not present

## 2017-08-31 DIAGNOSIS — G894 Chronic pain syndrome: Secondary | ICD-10-CM | POA: Diagnosis not present

## 2017-09-13 DIAGNOSIS — J441 Chronic obstructive pulmonary disease with (acute) exacerbation: Secondary | ICD-10-CM | POA: Diagnosis not present

## 2017-09-13 DIAGNOSIS — Z6820 Body mass index (BMI) 20.0-20.9, adult: Secondary | ICD-10-CM | POA: Diagnosis not present

## 2017-09-13 DIAGNOSIS — R509 Fever, unspecified: Secondary | ICD-10-CM | POA: Diagnosis not present

## 2017-10-06 DIAGNOSIS — M961 Postlaminectomy syndrome, not elsewhere classified: Secondary | ICD-10-CM | POA: Diagnosis not present

## 2017-10-06 DIAGNOSIS — M545 Low back pain: Secondary | ICD-10-CM | POA: Diagnosis not present

## 2017-10-06 DIAGNOSIS — G894 Chronic pain syndrome: Secondary | ICD-10-CM | POA: Diagnosis not present

## 2017-10-06 DIAGNOSIS — M79605 Pain in left leg: Secondary | ICD-10-CM | POA: Diagnosis not present

## 2017-11-04 DIAGNOSIS — G894 Chronic pain syndrome: Secondary | ICD-10-CM | POA: Diagnosis not present

## 2017-11-04 DIAGNOSIS — Z79891 Long term (current) use of opiate analgesic: Secondary | ICD-10-CM | POA: Diagnosis not present

## 2017-11-04 DIAGNOSIS — Z79899 Other long term (current) drug therapy: Secondary | ICD-10-CM | POA: Diagnosis not present

## 2017-11-04 DIAGNOSIS — M961 Postlaminectomy syndrome, not elsewhere classified: Secondary | ICD-10-CM | POA: Diagnosis not present

## 2017-11-04 DIAGNOSIS — M79605 Pain in left leg: Secondary | ICD-10-CM | POA: Diagnosis not present

## 2017-11-04 DIAGNOSIS — M545 Low back pain: Secondary | ICD-10-CM | POA: Diagnosis not present

## 2017-12-02 DIAGNOSIS — M545 Low back pain: Secondary | ICD-10-CM | POA: Diagnosis not present

## 2017-12-02 DIAGNOSIS — M961 Postlaminectomy syndrome, not elsewhere classified: Secondary | ICD-10-CM | POA: Diagnosis not present

## 2017-12-02 DIAGNOSIS — M79605 Pain in left leg: Secondary | ICD-10-CM | POA: Diagnosis not present

## 2017-12-02 DIAGNOSIS — G894 Chronic pain syndrome: Secondary | ICD-10-CM | POA: Diagnosis not present

## 2017-12-16 ENCOUNTER — Other Ambulatory Visit: Payer: Self-pay | Admitting: Cardiovascular Disease

## 2017-12-30 DIAGNOSIS — M79605 Pain in left leg: Secondary | ICD-10-CM | POA: Diagnosis not present

## 2017-12-30 DIAGNOSIS — Z79899 Other long term (current) drug therapy: Secondary | ICD-10-CM | POA: Diagnosis not present

## 2017-12-30 DIAGNOSIS — Z79891 Long term (current) use of opiate analgesic: Secondary | ICD-10-CM | POA: Diagnosis not present

## 2017-12-30 DIAGNOSIS — M545 Low back pain: Secondary | ICD-10-CM | POA: Diagnosis not present

## 2017-12-30 DIAGNOSIS — G894 Chronic pain syndrome: Secondary | ICD-10-CM | POA: Diagnosis not present

## 2018-01-03 DIAGNOSIS — R3914 Feeling of incomplete bladder emptying: Secondary | ICD-10-CM | POA: Diagnosis not present

## 2018-01-03 DIAGNOSIS — N3946 Mixed incontinence: Secondary | ICD-10-CM | POA: Diagnosis not present

## 2018-01-03 DIAGNOSIS — N3020 Other chronic cystitis without hematuria: Secondary | ICD-10-CM | POA: Diagnosis not present

## 2018-01-07 DIAGNOSIS — Z6820 Body mass index (BMI) 20.0-20.9, adult: Secondary | ICD-10-CM | POA: Diagnosis not present

## 2018-01-07 DIAGNOSIS — R609 Edema, unspecified: Secondary | ICD-10-CM | POA: Diagnosis not present

## 2018-01-07 DIAGNOSIS — Z0001 Encounter for general adult medical examination with abnormal findings: Secondary | ICD-10-CM | POA: Diagnosis not present

## 2018-01-07 DIAGNOSIS — M545 Low back pain: Secondary | ICD-10-CM | POA: Diagnosis not present

## 2018-01-07 DIAGNOSIS — E039 Hypothyroidism, unspecified: Secondary | ICD-10-CM | POA: Diagnosis not present

## 2018-01-07 DIAGNOSIS — R202 Paresthesia of skin: Secondary | ICD-10-CM | POA: Diagnosis not present

## 2018-01-07 DIAGNOSIS — I10 Essential (primary) hypertension: Secondary | ICD-10-CM | POA: Diagnosis not present

## 2018-01-07 DIAGNOSIS — J449 Chronic obstructive pulmonary disease, unspecified: Secondary | ICD-10-CM | POA: Diagnosis not present

## 2018-01-07 DIAGNOSIS — G4700 Insomnia, unspecified: Secondary | ICD-10-CM | POA: Diagnosis not present

## 2018-01-07 DIAGNOSIS — E782 Mixed hyperlipidemia: Secondary | ICD-10-CM | POA: Diagnosis not present

## 2018-01-07 DIAGNOSIS — I358 Other nonrheumatic aortic valve disorders: Secondary | ICD-10-CM | POA: Diagnosis not present

## 2018-01-26 DIAGNOSIS — M545 Low back pain: Secondary | ICD-10-CM | POA: Diagnosis not present

## 2018-01-26 DIAGNOSIS — M79605 Pain in left leg: Secondary | ICD-10-CM | POA: Diagnosis not present

## 2018-01-26 DIAGNOSIS — G894 Chronic pain syndrome: Secondary | ICD-10-CM | POA: Diagnosis not present

## 2018-01-26 DIAGNOSIS — M961 Postlaminectomy syndrome, not elsewhere classified: Secondary | ICD-10-CM | POA: Diagnosis not present

## 2018-02-03 DIAGNOSIS — R300 Dysuria: Secondary | ICD-10-CM | POA: Diagnosis not present

## 2018-02-03 DIAGNOSIS — N3020 Other chronic cystitis without hematuria: Secondary | ICD-10-CM | POA: Diagnosis not present

## 2018-02-03 DIAGNOSIS — R3914 Feeling of incomplete bladder emptying: Secondary | ICD-10-CM | POA: Diagnosis not present

## 2018-02-17 DIAGNOSIS — N3020 Other chronic cystitis without hematuria: Secondary | ICD-10-CM | POA: Diagnosis not present

## 2018-02-17 DIAGNOSIS — R3914 Feeling of incomplete bladder emptying: Secondary | ICD-10-CM | POA: Diagnosis not present

## 2018-02-23 DIAGNOSIS — M50323 Other cervical disc degeneration at C6-C7 level: Secondary | ICD-10-CM | POA: Diagnosis not present

## 2018-02-23 DIAGNOSIS — M47812 Spondylosis without myelopathy or radiculopathy, cervical region: Secondary | ICD-10-CM | POA: Diagnosis not present

## 2018-02-23 DIAGNOSIS — M47816 Spondylosis without myelopathy or radiculopathy, lumbar region: Secondary | ICD-10-CM | POA: Diagnosis not present

## 2018-02-23 DIAGNOSIS — M4312 Spondylolisthesis, cervical region: Secondary | ICD-10-CM | POA: Diagnosis not present

## 2018-03-03 ENCOUNTER — Ambulatory Visit: Payer: Medicare Other | Admitting: Cardiovascular Disease

## 2018-03-04 ENCOUNTER — Other Ambulatory Visit: Payer: Self-pay | Admitting: Cardiovascular Disease

## 2018-03-10 DIAGNOSIS — M47812 Spondylosis without myelopathy or radiculopathy, cervical region: Secondary | ICD-10-CM | POA: Diagnosis not present

## 2018-03-17 DIAGNOSIS — R3914 Feeling of incomplete bladder emptying: Secondary | ICD-10-CM | POA: Diagnosis not present

## 2018-03-17 DIAGNOSIS — N3020 Other chronic cystitis without hematuria: Secondary | ICD-10-CM | POA: Diagnosis not present

## 2018-03-22 ENCOUNTER — Ambulatory Visit (INDEPENDENT_AMBULATORY_CARE_PROVIDER_SITE_OTHER): Payer: Medicare Other | Admitting: Cardiovascular Disease

## 2018-03-22 ENCOUNTER — Encounter: Payer: Self-pay | Admitting: Cardiovascular Disease

## 2018-03-22 VITALS — BP 100/40 | HR 51 | Ht 62.0 in | Wt 118.0 lb

## 2018-03-22 DIAGNOSIS — E785 Hyperlipidemia, unspecified: Secondary | ICD-10-CM | POA: Diagnosis not present

## 2018-03-22 DIAGNOSIS — I25118 Atherosclerotic heart disease of native coronary artery with other forms of angina pectoris: Secondary | ICD-10-CM | POA: Diagnosis not present

## 2018-03-22 DIAGNOSIS — R002 Palpitations: Secondary | ICD-10-CM | POA: Diagnosis not present

## 2018-03-22 DIAGNOSIS — R600 Localized edema: Secondary | ICD-10-CM

## 2018-03-22 DIAGNOSIS — Z955 Presence of coronary angioplasty implant and graft: Secondary | ICD-10-CM | POA: Diagnosis not present

## 2018-03-22 DIAGNOSIS — Z23 Encounter for immunization: Secondary | ICD-10-CM | POA: Diagnosis not present

## 2018-03-22 DIAGNOSIS — R001 Bradycardia, unspecified: Secondary | ICD-10-CM

## 2018-03-22 DIAGNOSIS — R011 Cardiac murmur, unspecified: Secondary | ICD-10-CM | POA: Diagnosis not present

## 2018-03-22 MED ORDER — FUROSEMIDE 20 MG PO TABS: ORAL_TABLET | ORAL | 6 refills | Status: DC

## 2018-03-22 MED ORDER — METOPROLOL TARTRATE 25 MG PO TABS: 12.5000 mg | ORAL_TABLET | Freq: Two times a day (BID) | ORAL | 6 refills | Status: DC

## 2018-03-22 MED ORDER — POTASSIUM CHLORIDE CRYS ER 20 MEQ PO TBCR: 20.0000 meq | EXTENDED_RELEASE_TABLET | Freq: Every day | ORAL | 6 refills | Status: DC

## 2018-03-22 NOTE — Progress Notes (Signed)
SUBJECTIVE: The patient presents for follow-up of coronary artery disease. She had an LAD stent placed in 2003 with coronary angiography performed in 2012 which showed patency of the stent.   Carotid artery Dopplers on 04/05/17 showed less than 40% stenosis bilaterally.  Echocardiogram 08/06/14 showed normal left ventricular systolic function, EF 65%, mild concentric LVH, and diastolic dysfunction.  She denies chest pain, palpitations, and shortness of breath.  Bilateral leg edema has gotten worse.  She has been taking Lasix 20 mg twice daily but this has not alleviated her symptoms.  ECG performed in the office today which I ordered and personally interpreted demonstrates sinus bradycardia with a right bundle branch block.     Review of Systems: As per "subjective", otherwise negative.  Allergies  Allergen Reactions  . Tetracycline Nausea Only    Current Outpatient Medications  Medication Sig Dispense Refill  . aspirin 81 MG tablet Take 81 mg by mouth daily.    Marland Kitchen atorvastatin (LIPITOR) 10 MG tablet Take 10 mg by mouth daily.     . benazepril (LOTENSIN) 5 MG tablet Take 5 mg by mouth daily.     . calcium-vitamin D (OSCAL WITH D) 500-200 MG-UNIT per tablet Take 1 tablet by mouth daily.    . Cholecalciferol (VITAMIN D3) 1000 UNITS CAPS Take 1,000 Units by mouth daily. Take one tab daily    . diphenhydrAMINE (BENADRYL) 25 mg capsule Take 25 mg by mouth daily as needed for sleep.     Marland Kitchen esomeprazole (NEXIUM) 40 MG capsule Take 40 mg by mouth daily before breakfast.     . Ferrous Gluconate (IRON) 240 (27 FE) MG TABS Take 1 tablet by mouth daily.    . folic acid (FOLVITE) 800 MCG tablet Take 800 mcg by mouth daily.    . furosemide (LASIX) 20 MG tablet TAKE 1 TABLET DAILY AS NEEDED FOR EDEMA.MAY TAKE IN ADDITION TO DAILYDOSE ONLY AS NEEDED 30 tablet 3  . hyoscyamine (LEVSIN SL) 0.125 MG SL tablet Take 0.125 mg by mouth every 6 (six) hours as needed for cramping or diarrhea or  loose stools.     Marland Kitchen levothyroxine (SYNTHROID, LEVOTHROID) 75 MCG tablet Take 75 mcg by mouth daily before breakfast.     . MELATONIN PO Take 1 tablet by mouth at bedtime.    . metoprolol tartrate (LOPRESSOR) 25 MG tablet TAKE (1) TABLET TWICE DAILY. 60 tablet 3  . Morphine-Naltrexone (EMBEDA) 50-2 MG CPCR Take by mouth 2 (two) times daily.    . Multiple Vitamin (MULTIVITAMIN) tablet Take 1 tablet by mouth daily.     . nitroGLYCERIN (NITROSTAT) 0.4 MG SL tablet Place one tablet under tongue every 5 minutes up to 3 doses as needed for chest pain. No more than 3 doses over a 15 minute period. 100 tablet 0  . polyethylene glycol powder (GLYCOLAX/MIRALAX) powder Take 17 g by mouth daily as needed (Constipation).     . raloxifene (EVISTA) 60 MG tablet Take 60 mg by mouth daily.     . Tamsulosin HCl (FLOMAX) 0.4 MG CAPS Take 0.4 mg by mouth daily. Take 1 capsule by mouth daily.    . temazepam (RESTORIL) 30 MG capsule Take 30 mg by mouth at bedtime.     . tretinoin (RETIN-A) 0.05 % cream Apply 1 application topically at bedtime.      No current facility-administered medications for this visit.     Past Medical History:  Diagnosis Date  . Aortic valve sclerosis  Mild, echo, April, 2012  . ARDS (adult respiratory distress syndrome) (HCC) 2003   with tracheostomy-peritonitis  . Arthritis   . Back pain    Need for surgery, Dr. Trey Sailors  . CAD (coronary artery disease)    Stent to LAD, 2003 [facility] / nuclear scan April, 201 to artifact / persistent shortness of breath / catheterization Oct 21, 2010... widely patent stent to the mid LAD, no significant obstructive disease, should be low risk    for back surgery  . Carotid arterial disease (HCC)    Moderate-Dr. Myra Gianotti following  . Chronic back pain   . COPD (chronic obstructive pulmonary disease) (HCC)   . Diastolic dysfunction    Echo, April, 2011   EF 65%  . Dysrhythmia    afib  . Ejection fraction    EF 65%, echo, September 15, 2010  . GERD  (gastroesophageal reflux disease)   . Hemolytic anemia (HCC)    Followed by Dr. Derald Macleod  . Hypertension   . Hypothyroidism   . Left ventricular hypertrophy    EF 65%, echo, April, 2011  . MR (mitral regurgitation)     Mild echo, 2012 ///  Mild per Echo 2009,  mild, echo, April, 2011  . Myocardial infarction (HCC)   . Palpitations    Holter, September 16, 2010, PACs and PVCs,one 4 beat run of SVT  . Pneumonia 11-2011  . Preoperative clearance    Preop clearance for back surgery  . Shortness of breath    March, 2012  . UTI (urinary tract infection)    Chronic-on Macrodantin    Past Surgical History:  Procedure Laterality Date  . APPENDECTOMY    . BACK SURGERY    . BREAST LUMPECTOMY Left   . CATARACT EXTRACTION W/PHACO Right 12/05/2012   Procedure: CATARACT EXTRACTION PHACO AND INTRAOCULAR LENS PLACEMENT (IOC);  Surgeon: Gemma Payor, MD;  Location: [facility];  Service: Ophthalmology;  Laterality: Right;  CDE:15.30  . CATARACT EXTRACTION W/PHACO Left 12/15/2012   Procedure: CATARACT EXTRACTION PHACO AND INTRAOCULAR LENS PLACEMENT (IOC);  Surgeon: Gemma Payor, MD;  Location: [facility];  Service: Ophthalmology;  Laterality: Left;  CDE:22.56  . CORONARY ANGIOPLASTY WITH STENT PLACEMENT  2003  . repair of ruptured lumbar discs  12-2011  . SPINE SURGERY     lumbar disk removal by Dr. Laurian Brim at [facility]  . THYROID SURGERY  08/2009   Benign adenomas  . TONSILLECTOMY    . TRACHEOSTOMY  2003   Following ARDS     Social History   Socioeconomic History  . Marital status: Married    Spouse name: Not on file  . Number of children: Not on file  . Years of education: Not on file  . Highest education level: Not on file  Occupational History  . Occupation: Retired  Engineer, production  . Financial resource strain: Not on file  . Food insecurity:    Worry: Not on file    Inability: Not on file  . Transportation needs:    Medical: Not on file    Non-medical: Not on file  Tobacco Use  .  Smoking status: Former Smoker    Packs/day: 1.00    Years: 20.00    Pack years: 20.00    Types: Cigarettes    Last attempt to quit: 06/15/2000    Years since quitting: 17.7  . Smokeless tobacco: Never Used  Substance and Sexual Activity  . Alcohol use: No  . Drug use: No  . Sexual activity:  Not on file  Lifestyle  . Physical activity:    Days per week: Not on file    Minutes per session: Not on file  . Stress: Not on file  Relationships  . Social connections:    Talks on phone: Not on file    Gets together: Not on file    Attends religious service: Not on file    Active member of club or organization: Not on file    Attends meetings of clubs or organizations: Not on file    Relationship status: Not on file  . Intimate partner violence:    Fear of current or ex partner: Not on file    Emotionally abused: Not on file    Physically abused: Not on file    Forced sexual activity: Not on file  Other Topics Concern  . Not on file  Social History Narrative   Regular Exercise: Yes-Walk     Vitals:   03/22/18 1448  BP: (!) 100/40  Pulse: (!) 51  SpO2: 95%  Weight: 118 lb (53.5 kg)  Height: 5\' 2"  (1.575 m)    Wt Readings from Last 3 Encounters:  03/22/18 118 lb (53.5 kg)  04/05/17 120 lb 9.6 oz (54.7 kg)  03/03/17 116 lb (52.6 kg)     PHYSICAL EXAM General: NAD HEENT: Normal. Neck: No JVD, no thyromegaly. Lungs: Clear to auscultation bilaterally with normal respiratory effort. CV: Bradycardic, regular rhythm, normal S1/S2, no S3/S4, 2/6 pansystolic murmur along left sternal border and heard over right upper sternal border.    1+ pitting bilateral lower extremity edema.  No carotid bruit.   Abdomen: Soft, nontender, no distention.  Neurologic: Alert and oriented.  Psych: Normal affect. Skin: Normal. Musculoskeletal: No gross deformities.    ECG: Reviewed above under Subjective   Labs: Lab Results  Component Value Date/Time   K 3.9 11/29/2012 10:16 AM   BUN  19 11/29/2012 10:16 AM   CREATININE 0.73 11/29/2012 10:16 AM   HGB 12.4 11/29/2012 10:16 AM     Lipids: No results found for: LDLCALC, LDLDIRECT, CHOL, TRIG, HDL     ASSESSMENT AND PLAN: 1. CAD with LAD stent: Symptomatically stable.  Continue ASA, metoprolol (due to bradycardia, I will reduce the dose to 12.5 mg twice daily),and statin.  2. Carotid artery disease: Dopplers reviewed above. Continue ASA and statin.  3. Hyperlipidemia: Continue statin.  4. Palpitations/PSVT: Symptomatically stable. Due to bradycardia, I will reduce the dose to 12.5 mg twice daily.  5. Murmur: No significant valvular pathology by echocardiogram in 2016. There was some aortic valve sclerosis.  I will obtain a follow-up echocardiogram.  6. Bilateral leg edema: This is likely due to venous insufficiency. I recommended compression stockings but she has a difficult time using them. She has been taking Lasix 20 mg twice daily without relief of symptoms. I will order a 2-D echocardiogram with Doppler to evaluate cardiac structure, function, and regional wall motion. I will increase Lasix to 40 mg every morning and 20 mg every afternoon.  I will start potassium chloride 20 mEq daily.  I will check a basic metabolic panel within the next few days.  7.  Bradycardia: I will reduce Lopressor to 12.5 mg twice daily.    Disposition: Follow up 6 months   Prentice Docker, M.D., F.A.C.C.

## 2018-03-22 NOTE — Patient Instructions (Signed)
Medication Instructions:   Decrease Lopressor to 12.5mg  twice a day.  Increase Lasix to 40mg  every morning & 20mg  every evening.  Begin Potassium daily.  Continue all other medications.    Labwork:  BMET - order given today.  Please do this Friday.   Testing/Procedures:  Your physician has requested that you have an echocardiogram. Echocardiography is a painless test that uses sound waves to create images of your heart. It provides your doctor with information about the size and shape of your heart and how well your heart's chambers and valves are working. This procedure takes approximately one hour. There are no restrictions for this procedure.  Office will contact with results via phone or letter.    Follow-Up: Your physician wants you to follow up in: 6 months.  You will receive a reminder letter in the mail one-two months in advance.  If you don't receive a letter, please call our office to schedule the follow up appointment   Any Other Special Instructions Will Be Listed Below (If Applicable).  If you need a refill on your cardiac medications before your next appointment, please call your pharmacy.

## 2018-03-23 ENCOUNTER — Other Ambulatory Visit: Payer: Self-pay | Admitting: Cardiovascular Disease

## 2018-03-23 DIAGNOSIS — M4802 Spinal stenosis, cervical region: Secondary | ICD-10-CM | POA: Diagnosis not present

## 2018-03-23 DIAGNOSIS — M50223 Other cervical disc displacement at C6-C7 level: Secondary | ICD-10-CM | POA: Diagnosis not present

## 2018-03-23 DIAGNOSIS — M47812 Spondylosis without myelopathy or radiculopathy, cervical region: Secondary | ICD-10-CM | POA: Diagnosis not present

## 2018-03-23 DIAGNOSIS — R011 Cardiac murmur, unspecified: Secondary | ICD-10-CM

## 2018-03-23 DIAGNOSIS — R600 Localized edema: Secondary | ICD-10-CM

## 2018-03-23 MED ORDER — METOPROLOL TARTRATE 25 MG PO TABS: 12.5000 mg | ORAL_TABLET | Freq: Two times a day (BID) | ORAL | 6 refills | Status: DC

## 2018-03-23 MED ORDER — FUROSEMIDE 20 MG PO TABS: ORAL_TABLET | ORAL | 6 refills | Status: DC

## 2018-03-23 MED ORDER — POTASSIUM CHLORIDE CRYS ER 10 MEQ PO TBCR: 20.0000 meq | EXTENDED_RELEASE_TABLET | Freq: Every day | ORAL | 6 refills | Status: DC

## 2018-03-23 NOTE — Addendum Note (Signed)
Addended by: Lesle Chris on: 03/23/2018 10:01 AM   Modules accepted: Orders

## 2018-03-25 DIAGNOSIS — R600 Localized edema: Secondary | ICD-10-CM | POA: Diagnosis not present

## 2018-03-29 ENCOUNTER — Telehealth: Payer: Self-pay | Admitting: *Deleted

## 2018-03-29 NOTE — Telephone Encounter (Signed)
Notes recorded by Lesle Chris, LPN on 95/62/1308 at 3:58 PM EDT Patient notified. Copy to pmd. ------  Notes recorded by Laqueta Linden, MD on 03/28/2018 at 11:21 AM EDT The following labs are stable without significant clinical change: Potassium and renal function All other results are normal or within acceptable limits. Medication changes / Follow up labs / Other changes or recommendations:  None Prentice Docker, MD 03/28/2018 11:21 AM

## 2018-03-31 ENCOUNTER — Other Ambulatory Visit: Payer: Medicare Other

## 2018-03-31 DIAGNOSIS — Z79899 Other long term (current) drug therapy: Secondary | ICD-10-CM | POA: Diagnosis not present

## 2018-03-31 DIAGNOSIS — Z79891 Long term (current) use of opiate analgesic: Secondary | ICD-10-CM | POA: Diagnosis not present

## 2018-03-31 DIAGNOSIS — G894 Chronic pain syndrome: Secondary | ICD-10-CM | POA: Diagnosis not present

## 2018-04-12 ENCOUNTER — Other Ambulatory Visit: Payer: Self-pay | Admitting: Cardiovascular Disease

## 2018-04-12 DIAGNOSIS — M47812 Spondylosis without myelopathy or radiculopathy, cervical region: Secondary | ICD-10-CM | POA: Diagnosis not present

## 2018-04-20 ENCOUNTER — Other Ambulatory Visit: Payer: Self-pay

## 2018-04-20 ENCOUNTER — Ambulatory Visit (INDEPENDENT_AMBULATORY_CARE_PROVIDER_SITE_OTHER): Payer: Medicare Other

## 2018-04-20 DIAGNOSIS — R011 Cardiac murmur, unspecified: Secondary | ICD-10-CM | POA: Diagnosis not present

## 2018-04-20 DIAGNOSIS — R600 Localized edema: Secondary | ICD-10-CM

## 2018-04-26 DIAGNOSIS — M961 Postlaminectomy syndrome, not elsewhere classified: Secondary | ICD-10-CM | POA: Diagnosis not present

## 2018-04-26 DIAGNOSIS — M79605 Pain in left leg: Secondary | ICD-10-CM | POA: Diagnosis not present

## 2018-04-26 DIAGNOSIS — M545 Low back pain: Secondary | ICD-10-CM | POA: Diagnosis not present

## 2018-04-26 DIAGNOSIS — G894 Chronic pain syndrome: Secondary | ICD-10-CM | POA: Diagnosis not present

## 2018-05-06 ENCOUNTER — Other Ambulatory Visit: Payer: Self-pay

## 2018-05-06 ENCOUNTER — Inpatient Hospital Stay (HOSPITAL_COMMUNITY)
Admission: EM | Admit: 2018-05-06 | Discharge: 2018-05-09 | DRG: 193 | Disposition: A | Discharge status: Home / Self Care | Payer: Medicare Other | Attending: Internal Medicine | Admitting: Internal Medicine

## 2018-05-06 ENCOUNTER — Encounter (HOSPITAL_COMMUNITY): Payer: Self-pay | Admitting: Emergency Medicine

## 2018-05-06 ENCOUNTER — Emergency Department (HOSPITAL_COMMUNITY): Payer: Medicare Other

## 2018-05-06 DIAGNOSIS — R918 Other nonspecific abnormal finding of lung field: Secondary | ICD-10-CM | POA: Diagnosis not present

## 2018-05-06 DIAGNOSIS — G9341 Metabolic encephalopathy: Secondary | ICD-10-CM | POA: Diagnosis present

## 2018-05-06 DIAGNOSIS — G8929 Other chronic pain: Secondary | ICD-10-CM | POA: Diagnosis not present

## 2018-05-06 DIAGNOSIS — J189 Pneumonia, unspecified organism: Secondary | ICD-10-CM | POA: Diagnosis present

## 2018-05-06 DIAGNOSIS — Z961 Presence of intraocular lens: Secondary | ICD-10-CM | POA: Diagnosis present

## 2018-05-06 DIAGNOSIS — K219 Gastro-esophageal reflux disease without esophagitis: Secondary | ICD-10-CM | POA: Diagnosis present

## 2018-05-06 DIAGNOSIS — I5189 Other ill-defined heart diseases: Secondary | ICD-10-CM | POA: Diagnosis not present

## 2018-05-06 DIAGNOSIS — I4510 Unspecified right bundle-branch block: Secondary | ICD-10-CM | POA: Diagnosis not present

## 2018-05-06 DIAGNOSIS — Z888 Allergy status to other drugs, medicaments and biological substances status: Secondary | ICD-10-CM

## 2018-05-06 DIAGNOSIS — I2510 Atherosclerotic heart disease of native coronary artery without angina pectoris: Secondary | ICD-10-CM | POA: Diagnosis present

## 2018-05-06 DIAGNOSIS — Z9841 Cataract extraction status, right eye: Secondary | ICD-10-CM | POA: Diagnosis not present

## 2018-05-06 DIAGNOSIS — J440 Chronic obstructive pulmonary disease with acute lower respiratory infection: Secondary | ICD-10-CM | POA: Diagnosis present

## 2018-05-06 DIAGNOSIS — I25118 Atherosclerotic heart disease of native coronary artery with other forms of angina pectoris: Secondary | ICD-10-CM

## 2018-05-06 DIAGNOSIS — E860 Dehydration: Secondary | ICD-10-CM | POA: Insufficient documentation

## 2018-05-06 DIAGNOSIS — G4710 Hypersomnia, unspecified: Secondary | ICD-10-CM | POA: Diagnosis not present

## 2018-05-06 DIAGNOSIS — M549 Dorsalgia, unspecified: Secondary | ICD-10-CM | POA: Diagnosis present

## 2018-05-06 DIAGNOSIS — I252 Old myocardial infarction: Secondary | ICD-10-CM

## 2018-05-06 DIAGNOSIS — Z9842 Cataract extraction status, left eye: Secondary | ICD-10-CM

## 2018-05-06 DIAGNOSIS — M545 Low back pain: Secondary | ICD-10-CM | POA: Diagnosis not present

## 2018-05-06 DIAGNOSIS — E876 Hypokalemia: Secondary | ICD-10-CM

## 2018-05-06 DIAGNOSIS — Z79899 Other long term (current) drug therapy: Secondary | ICD-10-CM

## 2018-05-06 DIAGNOSIS — I5032 Chronic diastolic (congestive) heart failure: Secondary | ICD-10-CM | POA: Diagnosis present

## 2018-05-06 DIAGNOSIS — J181 Lobar pneumonia, unspecified organism: Secondary | ICD-10-CM

## 2018-05-06 DIAGNOSIS — Z955 Presence of coronary angioplasty implant and graft: Secondary | ICD-10-CM

## 2018-05-06 DIAGNOSIS — Z79890 Hormone replacement therapy (postmenopausal): Secondary | ICD-10-CM | POA: Diagnosis not present

## 2018-05-06 DIAGNOSIS — I959 Hypotension, unspecified: Secondary | ICD-10-CM | POA: Diagnosis not present

## 2018-05-06 DIAGNOSIS — I110 Hypertensive heart disease with heart failure: Secondary | ICD-10-CM | POA: Diagnosis present

## 2018-05-06 DIAGNOSIS — Z87891 Personal history of nicotine dependence: Secondary | ICD-10-CM | POA: Diagnosis not present

## 2018-05-06 DIAGNOSIS — N179 Acute kidney failure, unspecified: Secondary | ICD-10-CM | POA: Diagnosis present

## 2018-05-06 DIAGNOSIS — Z79891 Long term (current) use of opiate analgesic: Secondary | ICD-10-CM

## 2018-05-06 DIAGNOSIS — E039 Hypothyroidism, unspecified: Secondary | ICD-10-CM | POA: Diagnosis present

## 2018-05-06 DIAGNOSIS — Z7982 Long term (current) use of aspirin: Secondary | ICD-10-CM

## 2018-05-06 DIAGNOSIS — E890 Postprocedural hypothyroidism: Secondary | ICD-10-CM

## 2018-05-06 LAB — COMPREHENSIVE METABOLIC PANEL
ALT: 17 U/L (ref 0–44)
AST: 30 U/L (ref 15–41)
Albumin: 3.4 g/dL — ABNORMAL LOW (ref 3.5–5.0)
Alkaline Phosphatase: 69 U/L (ref 38–126)
Anion gap: 10 (ref 5–15)
BUN: 55 mg/dL — ABNORMAL HIGH (ref 8–23)
CO2: 22 mmol/L (ref 22–32)
Calcium: 8.5 mg/dL — ABNORMAL LOW (ref 8.9–10.3)
Chloride: 101 mmol/L (ref 98–111)
Creatinine, Ser: 2.57 mg/dL — ABNORMAL HIGH (ref 0.44–1.00)
GFR calc Af Amer: 19 mL/min — ABNORMAL LOW (ref >60)
GFR calc non Af Amer: 17 mL/min — ABNORMAL LOW (ref >60)
Glucose, Bld: 106 mg/dL — ABNORMAL HIGH (ref 70–99)
Potassium: 4.6 mmol/L (ref 3.5–5.1)
Sodium: 133 mmol/L — ABNORMAL LOW (ref 135–145)
Total Bilirubin: 1.1 mg/dL (ref 0.3–1.2)
Total Protein: 6.4 g/dL — ABNORMAL LOW (ref 6.5–8.1)

## 2018-05-06 LAB — CBC WITH DIFFERENTIAL/PLATELET
Abs Immature Granulocytes: 0.61 10*3/uL — ABNORMAL HIGH (ref 0.00–0.07)
Basophils Absolute: 0.1 10*3/uL (ref 0.0–0.1)
Basophils Relative: 1 %
Eosinophils Absolute: 0.2 10*3/uL (ref 0.0–0.5)
Eosinophils Relative: 1 %
HCT: 38 % (ref 36.0–46.0)
Hemoglobin: 12 g/dL (ref 12.0–15.0)
Immature Granulocytes: 5 %
Lymphocytes Relative: 6 %
Lymphs Abs: 0.8 10*3/uL (ref 0.7–4.0)
MCH: 28.4 pg (ref 26.0–34.0)
MCHC: 31.6 g/dL (ref 30.0–36.0)
MCV: 89.8 fL (ref 80.0–100.0)
Monocytes Absolute: 1 10*3/uL (ref 0.1–1.0)
Monocytes Relative: 8 %
Neutro Abs: 9.8 10*3/uL — ABNORMAL HIGH (ref 1.7–7.7)
Neutrophils Relative %: 79 %
Platelets: 125 10*3/uL — ABNORMAL LOW (ref 150–400)
RBC: 4.23 MIL/uL (ref 3.87–5.11)
RDW: 13 % (ref 11.5–15.5)
WBC: 12.4 10*3/uL — ABNORMAL HIGH (ref 4.0–10.5)
nRBC: 0 % (ref 0.0–0.2)

## 2018-05-06 LAB — PROCALCITONIN: Procalcitonin: 69.41 ng/mL

## 2018-05-06 LAB — URINALYSIS, ROUTINE W REFLEX MICROSCOPIC
Bilirubin Urine: NEGATIVE
Glucose, UA: NEGATIVE mg/dL
Ketones, ur: NEGATIVE mg/dL
Nitrite: NEGATIVE
Protein, ur: NEGATIVE mg/dL
Specific Gravity, Urine: 1.014 (ref 1.005–1.030)
pH: 5 (ref 5.0–8.0)

## 2018-05-06 LAB — I-STAT CHEM 8, ED
BUN: 82 mg/dL — ABNORMAL HIGH (ref 8–23)
Calcium, Ion: 1.11 mmol/L — ABNORMAL LOW (ref 1.15–1.40)
Chloride: 102 mmol/L (ref 98–111)
Creatinine, Ser: 2.6 mg/dL — ABNORMAL HIGH (ref 0.44–1.00)
Glucose, Bld: 103 mg/dL — ABNORMAL HIGH (ref 70–99)
HCT: 35 % — ABNORMAL LOW (ref 36.0–46.0)
Hemoglobin: 11.9 g/dL — ABNORMAL LOW (ref 12.0–15.0)
Potassium: 6.9 mmol/L (ref 3.5–5.1)
Sodium: 132 mmol/L — ABNORMAL LOW (ref 135–145)
TCO2: 26 mmol/L (ref 22–32)

## 2018-05-06 LAB — RAPID URINE DRUG SCREEN, HOSP PERFORMED
Amphetamines: NOT DETECTED
Barbiturates: NOT DETECTED
Benzodiazepines: POSITIVE — AB
Cocaine: NOT DETECTED
Opiates: POSITIVE — AB
Tetrahydrocannabinol: NOT DETECTED

## 2018-05-06 LAB — I-STAT TROPONIN, ED: Troponin i, poc: 0.07 ng/mL (ref 0.00–0.08)

## 2018-05-06 LAB — I-STAT CG4 LACTIC ACID, ED
Lactic Acid, Venous: 1.61 mmol/L (ref 0.5–1.9)
Lactic Acid, Venous: 1.16 mmol/L (ref 0.5–1.9)

## 2018-05-06 LAB — MRSA PCR SCREENING: MRSA by PCR: NEGATIVE

## 2018-05-06 MED ORDER — ACETAMINOPHEN 325 MG PO TABS
650.0000 mg | ORAL_TABLET | ORAL | Status: DC | PRN
  Administered 2018-05-07: 650 mg via ORAL
  Filled 2018-05-06: qty 2

## 2018-05-06 MED ORDER — VANCOMYCIN HCL IN DEXTROSE 1-5 GM/200ML-% IV SOLN
1000.0000 mg | Freq: Once | INTRAVENOUS | Status: AC
  Administered 2018-05-06: 1000 mg via INTRAVENOUS
  Filled 2018-05-06: qty 200

## 2018-05-06 MED ORDER — NALOXONE HCL 0.4 MG/ML IJ SOLN
0.4000 mg | Freq: Once | INTRAMUSCULAR | Status: AC
  Administered 2018-05-06: 0.4 mg via INTRAVENOUS
  Filled 2018-05-06: qty 1

## 2018-05-06 MED ORDER — PIPERACILLIN-TAZOBACTAM 3.375 G IVPB 30 MIN
3.3750 g | Freq: Once | INTRAVENOUS | Status: AC
  Administered 2018-05-06: 3.375 g via INTRAVENOUS
  Filled 2018-05-06: qty 50

## 2018-05-06 MED ORDER — SODIUM CHLORIDE 0.9 % IV SOLN
500.0000 mg | INTRAVENOUS | Status: DC
  Administered 2018-05-06 – 2018-05-08 (×3): 500 mg via INTRAVENOUS
  Filled 2018-05-06 (×5): qty 500

## 2018-05-06 MED ORDER — ATORVASTATIN CALCIUM 10 MG PO TABS
10.0000 mg | ORAL_TABLET | Freq: Every day | ORAL | Status: DC
  Administered 2018-05-06 – 2018-05-09 (×4): 10 mg via ORAL
  Filled 2018-05-06 (×4): qty 1

## 2018-05-06 MED ORDER — SODIUM CHLORIDE 0.9 % IV BOLUS
2000.0000 mL | Freq: Once | INTRAVENOUS | Status: AC
  Administered 2018-05-06: 2000 mL via INTRAVENOUS

## 2018-05-06 MED ORDER — ASPIRIN EC 81 MG PO TBEC
81.0000 mg | DELAYED_RELEASE_TABLET | Freq: Every day | ORAL | Status: DC
  Administered 2018-05-06 – 2018-05-09 (×4): 81 mg via ORAL
  Filled 2018-05-06 (×8): qty 1

## 2018-05-06 MED ORDER — HYOSCYAMINE SULFATE 0.125 MG SL SUBL: 0.1250 mg | SUBLINGUAL_TABLET | Freq: Four times a day (QID) | SUBLINGUAL | Status: DC | PRN

## 2018-05-06 MED ORDER — LEVOTHYROXINE SODIUM 75 MCG PO TABS
75.0000 ug | ORAL_TABLET | Freq: Every day | ORAL | Status: DC
  Administered 2018-05-07 – 2018-05-09 (×3): 75 ug via ORAL
  Filled 2018-05-06 (×3): qty 1

## 2018-05-06 MED ORDER — ENOXAPARIN SODIUM 30 MG/0.3ML ~~LOC~~ SOLN
30.0000 mg | SUBCUTANEOUS | Status: DC
  Administered 2018-05-06: 30 mg via SUBCUTANEOUS
  Filled 2018-05-06: qty 0.3

## 2018-05-06 MED ORDER — SODIUM CHLORIDE 0.9 % IV SOLN
1.0000 g | INTRAVENOUS | Status: DC
  Administered 2018-05-06 – 2018-05-08 (×3): 1 g via INTRAVENOUS
  Filled 2018-05-06: qty 1
  Filled 2018-05-06 (×4): qty 10

## 2018-05-06 MED ORDER — PANTOPRAZOLE SODIUM 40 MG PO TBEC
40.0000 mg | DELAYED_RELEASE_TABLET | Freq: Every day | ORAL | Status: DC
  Administered 2018-05-06 – 2018-05-09 (×4): 40 mg via ORAL
  Filled 2018-05-06 (×4): qty 1

## 2018-05-06 MED ORDER — NITROFURANTOIN MACROCRYSTAL 50 MG PO CAPS: 50.0000 mg | ORAL_CAPSULE | Freq: Every day | ORAL | Status: DC

## 2018-05-06 MED ORDER — SODIUM CHLORIDE 0.9 % IV SOLN
INTRAVENOUS | Status: DC
  Administered 2018-05-06 – 2018-05-07 (×2): via INTRAVENOUS

## 2018-05-06 MED ORDER — POLYETHYLENE GLYCOL 3350 17 GM/SCOOP PO POWD
17.0000 g | Freq: Every day | ORAL | Status: DC | PRN
  Filled 2018-05-06: qty 255

## 2018-05-06 MED ORDER — ONDANSETRON HCL 4 MG/2ML IJ SOLN
4.0000 mg | Freq: Once | INTRAMUSCULAR | Status: AC
  Administered 2018-05-06: 4 mg via INTRAVENOUS
  Filled 2018-05-06: qty 2

## 2018-05-06 MED ORDER — NALOXONE HCL 2 MG/2ML IJ SOSY
1.0000 mg | PREFILLED_SYRINGE | Freq: Once | INTRAMUSCULAR | Status: AC
  Administered 2018-05-06: 1 mg via INTRAVENOUS
  Filled 2018-05-06: qty 2

## 2018-05-06 NOTE — ED Notes (Signed)
Date and time results received: 05/06/18 1547 (use smartphrase ".now" to insert current time)  Test: K+ Critical Value: 6.9  Name of Provider Notified: [email or web address]  Orders Received? Or Actions Taken?:  See chart

## 2018-05-06 NOTE — ED Triage Notes (Signed)
Patient complaining of nausea and vomiting since yesterday. Denies pain.

## 2018-05-06 NOTE — H&P (Addendum)
History and Physical  Mckenzie [name] ZOX:[id number] DOB: 04-13-1936 DOA: 05/06/2018  Referring physician: Dr Estell Harpin, ED physician PCP: Royann Shivers, PA-C  Outpatient Specialists:   Patient Coming From: home  Chief Complaint: vomiting  HPI: Mckenzie [name] is a 82 y.o. female with a history of back pain with tens implanted and chronic opiate use, coronary artery disease, diastolic dysfunction with preserved EF, COPD, hypothyroidism.  Patient seen for vomiting that started yesterday and is continued.  She has some mild shortness of breath but denies cough, fevers, chills, abdominal pain.  No palliating or provoking factors.  The patient has been taking her medications as normal.  She has felt a lot more sleepy than normal.  Emergency Department Course: Patient hypersomnolent upon arrival.  Code sepsis called.  Patient started on broad-spectrum antibiotics after blood cultures obtained.  Chest x-ray shows right lower lobe pneumonia.  Review of Systems:   Pt denies any fevers, chills, nausea, vomiting, diarrhea, constipation, abdominal pain, shortness of breath, dyspnea on exertion, orthopnea, cough, wheezing, palpitations, headache, vision changes, lightheadedness, dizziness, melena, rectal bleeding.  Review of systems are otherwise negative  Past Medical History:  Diagnosis Date  . Aortic valve sclerosis    Mild, echo, April, 2012  . ARDS (adult respiratory distress syndrome) (HCC) 2003   with tracheostomy-peritonitis  . Arthritis   . Back pain    Need for surgery, Dr. Trey Sailors  . CAD (coronary artery disease)    Stent to LAD, 2003 [facility] / nuclear scan April, 201 to artifact / persistent shortness of breath / catheterization Oct 21, 2010... widely patent stent to the mid LAD, no significant obstructive disease, should be low risk    for back surgery  . Carotid arterial disease (HCC)    Moderate-Dr. Myra Gianotti following  . Chronic back pain   . COPD (chronic obstructive  pulmonary disease) (HCC)   . Diastolic dysfunction    Echo, April, 2011   EF 65%  . Dysrhythmia    afib  . Ejection fraction    EF 65%, echo, September 15, 2010  . GERD (gastroesophageal reflux disease)   . Hemolytic anemia (HCC)    Followed by Dr. Derald Macleod  . Hypertension   . Hypothyroidism   . Left ventricular hypertrophy    EF 65%, echo, April, 2011  . MR (mitral regurgitation)     Mild echo, 2012 ///  Mild per Echo 2009,  mild, echo, April, 2011  . Myocardial infarction (HCC)   . Palpitations    Holter, September 16, 2010, PACs and PVCs,one 4 beat run of SVT  . Pneumonia 11-2011  . Preoperative clearance    Preop clearance for back surgery  . Shortness of breath    March, 2012  . UTI (urinary tract infection)    Chronic-on Macrodantin   Past Surgical History:  Procedure Laterality Date  . APPENDECTOMY    . BACK SURGERY    . BREAST LUMPECTOMY Left   . CATARACT EXTRACTION W/PHACO Right 12/05/2012   Procedure: CATARACT EXTRACTION PHACO AND INTRAOCULAR LENS PLACEMENT (IOC);  Surgeon: Gemma Payor, MD;  Location: [facility];  Service: Ophthalmology;  Laterality: Right;  CDE:15.30  . CATARACT EXTRACTION W/PHACO Left 12/15/2012   Procedure: CATARACT EXTRACTION PHACO AND INTRAOCULAR LENS PLACEMENT (IOC);  Surgeon: Gemma Payor, MD;  Location: [facility];  Service: Ophthalmology;  Laterality: Left;  CDE:22.56  . CORONARY ANGIOPLASTY WITH STENT PLACEMENT  2003  . repair of ruptured lumbar discs  12-2011  . SPINE SURGERY  lumbar disk removal by Dr. Laurian Brim at [facility]  . THYROID SURGERY  08/2009   Benign adenomas  . TONSILLECTOMY    . TRACHEOSTOMY  2003   Following ARDS    Social History:  reports that she quit smoking about 17 years ago. Her smoking use included cigarettes. She has a 20.00 pack-year smoking history. She has never used smokeless tobacco. She reports that she does not drink alcohol or use drugs. Patient lives at home  Allergies  Allergen Reactions  . Tetracycline Nausea  Only    Family History  Problem Relation Age of Onset  . COPD Other       Prior to Admission medications   Medication Sig Start Date End Date Taking? Authorizing Provider  atorvastatin (LIPITOR) 10 MG tablet Take 10 mg by mouth daily.  07/22/12  Yes [provider]  benazepril (LOTENSIN) 5 MG tablet Take 5 mg by mouth daily.    Yes [provider]  furosemide (LASIX) 20 MG tablet Take 2 tabs (40mg ) by mouth every morning & 1 tab (20mg ) every evening 03/23/18  Yes Laqueta Linden, MD  hyoscyamine (LEVSIN SL) 0.125 MG SL tablet Take 0.125 mg by mouth every 6 (six) hours as needed for cramping or diarrhea or loose stools.    Yes [provider]  levothyroxine (SYNTHROID, LEVOTHROID) 75 MCG tablet Take 75 mcg by mouth daily before breakfast.    Yes [provider]  potassium chloride SA (K-DUR,KLOR-CON) 10 MEQ tablet Take 2 tablets (20 mEq total) by mouth daily. 03/23/18  Yes Laqueta Linden, MD  temazepam (RESTORIL) 30 MG capsule Take 30 mg by mouth at bedtime.    Yes [provider]  tretinoin (RETIN-A) 0.05 % cream Apply 1 application topically at bedtime.    Yes [provider]  aspirin 81 MG tablet Take 81 mg by mouth daily.    [provider]  calcium-vitamin D (OSCAL WITH D) 500-200 MG-UNIT per tablet Take 1 tablet by mouth daily.    [provider]  Cholecalciferol (VITAMIN D3) 1000 UNITS CAPS Take 1,000 Units by mouth daily. Take one tab daily    [provider]  diphenhydrAMINE (BENADRYL) 25 mg capsule Take 25 mg by mouth daily as needed for sleep.     [provider]  esomeprazole (NEXIUM) 40 MG capsule Take 40 mg by mouth daily before breakfast.     [provider]  Ferrous Gluconate (IRON) 240 (27 FE) MG TABS Take 1 tablet by mouth daily.    [provider]  folic acid (FOLVITE) 800 MCG tablet Take 800 mcg by mouth daily.    [provider]  MELATONIN PO Take 1  tablet by mouth at bedtime.    [provider]  metoprolol tartrate (LOPRESSOR) 25 MG tablet TAKE (1) TABLET TWICE DAILY. 04/12/18   Laqueta Linden, MD  Morphine-Naltrexone (EMBEDA) 50-2 MG CPCR Take by mouth 2 (two) times daily.    [provider]  Multiple Vitamin (MULTIVITAMIN) tablet Take 1 tablet by mouth daily.     [provider]  nitrofurantoin (MACRODANTIN) 50 MG capsule Take 1 capsule by mouth daily. 04/21/18   [provider]  nitroGLYCERIN (NITROSTAT) 0.4 MG SL tablet Place one tablet under tongue every 5 minutes up to 3 doses as needed for chest pain. No more than 3 doses over a 15 minute period. 12/14/13   Luis Abed, MD  polyethylene glycol powder (GLYCOLAX/MIRALAX) powder Take 17 g by mouth  daily as needed (Constipation).     [provider]  diltiazem (CARDIZEM SR) 120 MG 12 hr capsule TAKE ONE CAPSULE BY MOUTH EVERY DAY 09/30/11 04/18/12  Luis AbedKatz, Jeffrey D, MD  diltiazem [facility](TIAZAC) 120 MG 24 hr capsule Take 1 capsule (120 mg total) by mouth daily. 02/27/11 09/30/11  Luis AbedKatz, Jeffrey D, MD    Physical Exam: BP (!) 65/33   Pulse 81   Temp 97.8 F (36.6 C) (Oral)   Resp 18   SpO2 100%   . General: Elderly Caucasian female.  Somnolent, but arouses fairly easily and oriented x3. No acute cardiopulmonary distress.  Marland Kitchen. HEENT: Normocephalic atraumatic.  Right and left ears normal in appearance.  Pupils equal, round, reactive to light. Extraocular muscles are intact. Sclerae anicteric and noninjected.  Moist mucosal membranes. No mucosal lesions.  . Neck: Neck supple without lymphadenopathy. No carotid bruits. No masses palpated.  . Cardiovascular: Regular rate with normal S1-S2 sounds. No murmurs, rubs, gallops auscultated. No JVD.  Marland Kitchen. Respiratory: Good respiratory effort.  Rales in right base.  No wheezing or no accessory muscle use. . Abdomen: Soft, nontender, nondistended. Active bowel sounds. No masses or hepatosplenomegaly  . Skin: No  rashes, lesions, or ulcerations.  Dry, warm to touch. 2+ dorsalis pedis and radial pulses. . Musculoskeletal: No calf or leg pain. All major joints not erythematous nontender.  No upper or lower joint deformation.  Good ROM.  No contractures  . Psychiatric: Intact judgment and insight. Pleasant and cooperative. . Neurologic: No focal neurological deficits. Strength is 5/5 and symmetric in upper and lower extremities.  Cranial nerves II through XII are grossly intact.           Labs on Admission: I have personally reviewed following labs and imaging studies  CBC: Recent Labs  Lab 05/06/18 1538 05/06/18 1547  WBC 12.4*  --   NEUTROABS 9.8*  --   HGB 12.0 11.9*  HCT 38.0 35.0*  MCV 89.8  --   PLT 125*  --    Basic Metabolic Panel: Recent Labs  Lab 05/06/18 1538 05/06/18 1547  NA 133* 132*  K 4.6 6.9*  CL 101 102  CO2 22  --   GLUCOSE 106* 103*  BUN 55* 82*  CREATININE 2.57* 2.60*  CALCIUM 8.5*  --    GFR: CrCl cannot be calculated (Unknown ideal weight.). Liver Function Tests: Recent Labs  Lab 05/06/18 1538  AST 30  ALT 17  ALKPHOS 69  BILITOT 1.1  PROT 6.4*  ALBUMIN 3.4*   No results for input(s): LIPASE, AMYLASE in the last 168 hours. No results for input(s): AMMONIA in the last 168 hours. Coagulation Profile: No results for input(s): INR, PROTIME in the last 168 hours. Cardiac Enzymes: No results for input(s): CKTOTAL, CKMB, CKMBINDEX, TROPONINI in the last 168 hours. BNP (last 3 results) No results for input(s): PROBNP in the last 8760 hours. HbA1C: No results for input(s): HGBA1C in the last 72 hours. CBG: No results for input(s): GLUCAP in the last 168 hours. Lipid Profile: No results for input(s): CHOL, HDL, LDLCALC, TRIG, CHOLHDL, LDLDIRECT in the last 72 hours. Thyroid Function Tests: No results for input(s): TSH, T4TOTAL, FREET4, T3FREE, THYROIDAB in the last 72 hours. Anemia Panel: No results for input(s): VITAMINB12, FOLATE, FERRITIN, TIBC,  IRON, RETICCTPCT in the last 72 hours. Urine analysis:    Component Value Date/Time   COLORURINE AMBER (A) 05/06/2018 1530   APPEARANCEUR CLOUDY (A) 05/06/2018 1530   LABSPEC 1.014 05/06/2018 1530  PHURINE 5.0 05/06/2018 1530   GLUCOSEU NEGATIVE 05/06/2018 1530   HGBUR MODERATE (A) 05/06/2018 1530   BILIRUBINUR NEGATIVE 05/06/2018 1530   KETONESUR NEGATIVE 05/06/2018 1530   PROTEINUR NEGATIVE 05/06/2018 1530   NITRITE NEGATIVE 05/06/2018 1530   LEUKOCYTESUR MODERATE (A) 05/06/2018 1530   Sepsis Labs: @LABRCNTIP (procalcitonin:4,lacticidven:4) )No results found for this or any previous visit (from the past 240 hour(s)).   Radiological Exams on Admission: Dg Chest Portable 1 View  Result Date: 05/06/2018 CLINICAL DATA:  Weakness EXAM: PORTABLE CHEST 1 VIEW COMPARISON:  None. FINDINGS: Airspace disease asymmetrically on the right, with volume loss. In the setting of code sepsis this is presumably pneumonia or aspiration. The left lung is clear. Normal heart size. Dorsal column stimulator leads. IMPRESSION: Asymmetric right lung opacity with volume loss, primarily concerning for pneumonia or aspiration. Electronically Signed   By: Marnee Spring M.D.   On: 05/06/2018 15:55    EKG: Independently reviewed.  Sinus rhythm with right bundle branch block.  No acute ST elevation or depression.  Probable old infarct.  Assessment/Plan: Principal Problem:   CAP (community acquired pneumonia) Active Problems:   GERD   Diastolic dysfunction   Back pain   CAD (coronary artery disease)   Hypothyroidism, postsurgical   Hypersomnolence   Acute renal failure (ARF) (HCC)   Dehydration    This patient was discussed with the ED physician, including pertinent vitals, physical exam findings, labs, and imaging.  We also discussed care given by the ED provider.  1. Community-acquired pneumonia a. Admission criteria: i. Patient mildly hypotensive to the 80s and 90s  systolically ii. Community-acquired pneumonia in the setting of acute renal failure Antibiotics: Rocephin and azithromycin Robitussin Blood cultures drawn in the emergency department Sputum cultures CBC tomorrow Strep antigen by urine Respiratory panel Procalcitonin 2. Hypotension a. In review of the patient's medical records, the patient has fairly low baseline systolic pressure of approximately 100. b. Hold antihypertensives at this time 3. Hypersomnolence a. Likely secondary to metabolite buildup from long-acting morphine b. Hold morphine and other sedating medications at this time 4. Acute renal failure a. Secondary to vomiting and dehydration b. IV fluids c. Check creatinine in the morning 5. Dehydration a. IV fluids 6. Coronary artery disease a. Continue antilipid therapy 7. Diastolic dysfunction a. Be cautious with IV fluids 8. GERD a. Cont antacid therapy 9. Hypothyroidism a. Continue synthroid  DVT prophylaxis: L:ovenox Consultants: none Code Status: full code Family Communication: none  Disposition Plan: home following stabilization   Levie Heritage, DO [facility] Pager [phone]  If 7PM-7AM, please contact night-coverage [email or web address] Password [email or web address]  .

## 2018-05-06 NOTE — ED Provider Notes (Signed)
[facility] EMERGENCY DEPARTMENT Provider Note   CSN: [id number] Arrival date & time: 05/06/18  1511     History   Chief Complaint Chief Complaint  Patient presents with  . Emesis    HPI Mckenzie [name] is a 82 y.o. female.  Patient brought to the emergency department by her husband who states she is been sleepy for last day and also has been vomiting some.  The history is provided by the patient and a relative. No language interpreter was used.  Emesis   This is a new problem. The current episode started 12 to 24 hours ago. The problem occurs 2 to 4 times per day. The problem has not changed since onset.The emesis has an appearance of stomach contents. There has been no fever. Pertinent negatives include no abdominal pain, no chills, no cough, no diarrhea and no headaches. Risk factors: Unknown.    Past Medical History:  Diagnosis Date  . Aortic valve sclerosis    Mild, echo, April, 2012  . ARDS (adult respiratory distress syndrome) (HCC) 2003   with tracheostomy-peritonitis  . Arthritis   . Back pain    Need for surgery, Dr. Trey Sailors  . CAD (coronary artery disease)    Stent to LAD, 2003 [facility] / nuclear scan April, 201 to artifact / persistent shortness of breath / catheterization Oct 21, 2010... widely patent stent to the mid LAD, no significant obstructive disease, should be low risk    for back surgery  . Carotid arterial disease (HCC)    Moderate-Dr. Myra Gianotti following  . Chronic back pain   . COPD (chronic obstructive pulmonary disease) (HCC)   . Diastolic dysfunction    Echo, April, 2011   EF 65%  . Dysrhythmia    afib  . Ejection fraction    EF 65%, echo, September 15, 2010  . GERD (gastroesophageal reflux disease)   . Hemolytic anemia (HCC)    Followed by Dr. Derald Macleod  . Hypertension   . Hypothyroidism   . Left ventricular hypertrophy    EF 65%, echo, April, 2011  . MR (mitral regurgitation)     Mild echo, 2012 ///  Mild per Echo 2009,  mild, echo, April,  2011  . Myocardial infarction (HCC)   . Palpitations    Holter, September 16, 2010, PACs and PVCs,one 4 beat run of SVT  . Pneumonia 11-2011  . Preoperative clearance    Preop clearance for back surgery  . Shortness of breath    March, 2012  . UTI (urinary tract infection)    Chronic-on Macrodantin    Patient Active Problem List   Diagnosis Date Noted  . Current chronic use of systemic steroids 07/04/2015  . Nontoxic multinodular goiter 05/01/2014  . Easy bruisability 08/23/2013  . Occlusion and stenosis of carotid artery without mention of cerebral infarction 03/21/2012  . Hypothyroidism, postsurgical 02/02/2012  . Preoperative clearance   . CAD (coronary artery disease)   . Palpitations   . Ejection fraction   . MR (mitral regurgitation)   . Left ventricular hypertrophy   . Aortic valve sclerosis   . Carotid arterial disease (HCC)   . GERD (gastroesophageal reflux disease)   . Hypothyroidism   . ARDS (adult respiratory distress syndrome) (HCC)   . Hemolytic anemia (HCC)   . Diastolic dysfunction   . Shortness of breath   . Back pain   . Hyperlipidemia 09/10/2009  . GERD 09/10/2009    Past Surgical History:  Procedure Laterality Date  .  APPENDECTOMY    . BACK SURGERY    . BREAST LUMPECTOMY Left   . CATARACT EXTRACTION W/PHACO Right 12/05/2012   Procedure: CATARACT EXTRACTION PHACO AND INTRAOCULAR LENS PLACEMENT (IOC);  Surgeon: Gemma Payor, MD;  Location: [facility];  Service: Ophthalmology;  Laterality: Right;  CDE:15.30  . CATARACT EXTRACTION W/PHACO Left 12/15/2012   Procedure: CATARACT EXTRACTION PHACO AND INTRAOCULAR LENS PLACEMENT (IOC);  Surgeon: Gemma Payor, MD;  Location: [facility];  Service: Ophthalmology;  Laterality: Left;  CDE:22.56  . CORONARY ANGIOPLASTY WITH STENT PLACEMENT  2003  . repair of ruptured lumbar discs  12-2011  . SPINE SURGERY     lumbar disk [facility]. Laurian Brim [facility]  . THYROID SURGERY  08/2009   Benign adenomas  . TONSILLECTOMY      . TRACHEOSTOMY  2003   Following ARDS      OB History   None      Home Medications    Prior to Admission medications   Medication Sig Start Date End Date Taking? Authorizing Provider  atorvastatin (LIPITOR) 10 MG tablet Take 10 mg by mouth daily.  07/22/12  Yes [provider]  benazepril (LOTENSIN) 5 MG tablet Take 5 mg by mouth daily.    Yes [provider]  furosemide (LASIX) 20 MG tablet Take 2 tabs (40mg ) by mouth every morning & 1 tab (20mg ) every evening 03/23/18  Yes Laqueta Linden, MD  hyoscyamine (LEVSIN SL) 0.125 MG SL tablet Take 0.125 mg by mouth every 6 (six) hours as needed for cramping or diarrhea or loose stools.    Yes [provider]  levothyroxine (SYNTHROID, LEVOTHROID) 75 MCG tablet Take 75 mcg by mouth daily before breakfast.    Yes [provider]  potassium chloride SA (K-DUR,KLOR-CON) 10 MEQ tablet Take 2 tablets (20 mEq total) by mouth daily. 03/23/18  Yes Laqueta Linden, MD  temazepam (RESTORIL) 30 MG capsule Take 30 mg by mouth at bedtime.    Yes [provider]  tretinoin (RETIN-A) 0.05 % cream Apply 1 application topically at bedtime.    Yes [provider]  aspirin 81 MG tablet Take 81 mg by mouth daily.    [provider]  calcium-vitamin D (OSCAL WITH D) 500-200 MG-UNIT per tablet Take 1 tablet by mouth daily.    [provider]  Cholecalciferol (VITAMIN D3) 1000 UNITS CAPS Take 1,000 Units by mouth daily. Take one tab daily    [provider]  diphenhydrAMINE (BENADRYL) 25 mg capsule Take 25 mg by mouth daily as needed for sleep.     [provider]  esomeprazole (NEXIUM) 40 MG capsule Take 40 mg by mouth daily before breakfast.     [provider]  Ferrous Gluconate (IRON) 240 (27 FE) MG TABS Take 1 tablet by mouth daily.    [provider]  folic acid (FOLVITE) 800 MCG tablet Take 800 mcg by mouth daily.    [provider]   MELATONIN PO Take 1 tablet by mouth at bedtime.    [provider]  metoprolol tartrate (LOPRESSOR) 25 MG tablet TAKE (1) TABLET TWICE DAILY. 04/12/18   Laqueta Linden, MD  Morphine-Naltrexone (EMBEDA) 50-2 MG CPCR Take by mouth 2 (two) times daily.    [provider]  Multiple Vitamin (MULTIVITAMIN) tablet Take 1 tablet by mouth daily.     [provider]  nitrofurantoin (MACRODANTIN) 50 MG capsule Take 1 capsule by mouth daily. 04/21/18   [provider]  nitroGLYCERIN (NITROSTAT) 0.4 MG SL tablet Place one tablet under tongue every 5 minutes up to 3 doses as needed for chest pain. No more than 3 doses over a 15 minute period. 12/14/13   Luis Abed, MD  polyethylene glycol powder (GLYCOLAX/MIRALAX) powder Take 17 g by mouth daily as needed (Constipation).     [provider]  diltiazem (CARDIZEM SR) 120 MG 12 hr capsule TAKE ONE CAPSULE BY MOUTH EVERY DAY 09/30/11 04/18/12  Luis Abed, MD  diltiazem [facility]) 120 MG 24 hr capsule Take 1 capsule (120 mg total) by mouth daily. 02/27/11 09/30/11  Luis Abed, MD    Family History Family History  Problem Relation Age of Onset  . COPD Other     Social History Social History   Tobacco Use  . Smoking status: Former Smoker    Packs/day: 1.00    Years: 20.00    Pack years: 20.00    Types: Cigarettes    Last attempt to quit: 06/15/2000    Years since quitting: 17.9  . Smokeless tobacco: Never Used  Substance Use Topics  . Alcohol use: No  . Drug use: No     Allergies   Tetracycline   Review of Systems Review of Systems  Constitutional: Negative for appetite change, chills and fatigue.  HENT: Negative for congestion, ear discharge and sinus pressure.   Eyes: Negative for discharge.  Respiratory: Negative for cough.   Cardiovascular: Negative for chest pain.  Gastrointestinal: Positive for vomiting. Negative for abdominal pain and diarrhea.  Genitourinary: Negative for  frequency and hematuria.  Musculoskeletal: Negative for back pain.  Skin: Negative for rash.  Neurological: Negative for seizures and headaches.  Psychiatric/Behavioral: Negative for hallucinations.     Physical Exam Updated Vital Signs BP (!) 65/33   Pulse 81   Temp 97.8 F (36.6 C) (Oral)   Resp 18   SpO2 100%   Physical Exam  Constitutional: She is oriented to person, place, and time. She appears well-developed.  HENT:  Head: Normocephalic.  Point pupils  Eyes: Conjunctivae and EOM are normal. No scleral icterus.  Neck: Neck supple. No thyromegaly present.  Cardiovascular: Normal rate and regular rhythm. Exam reveals no gallop and no friction rub.  No murmur heard. Pulmonary/Chest: No stridor. She has no wheezes. She has no rales. She exhibits no tenderness.  Abdominal: She exhibits no distension. There is no tenderness. There is no rebound.  Musculoskeletal: Normal range of motion. She exhibits no edema.  Lymphadenopathy:    She has no cervical adenopathy.  Neurological: She is oriented to person, place, and time. She exhibits normal muscle tone. Coordination normal.  Patient mildly lethargic  Skin: No rash noted. No erythema.  Psychiatric: She has a normal mood and affect. Her behavior is normal.     ED Treatments / Results  Labs (all labs ordered are listed, but only abnormal results are displayed) Labs Reviewed  COMPREHENSIVE METABOLIC PANEL - Abnormal; Notable for the following components:      Result Value   Sodium 133 (*)    Glucose, Bld 106 (*)    BUN 55 (*)    Creatinine, Ser 2.57 (*)    Calcium 8.5 (*)    Total Protein 6.4 (*)    Albumin 3.4 (*)    GFR calc non Af Amer 17 (*)    GFR calc Af Amer 19 (*)    All other components within normal limits  CBC WITH DIFFERENTIAL/PLATELET - Abnormal; Notable  for the following components:   WBC 12.4 (*)    Platelets 125 (*)    Neutro Abs 9.8 (*)    Abs Immature Granulocytes 0.61 (*)    All other components  within normal limits  I-STAT CHEM 8, ED - Abnormal; Notable for the following components:   Sodium 132 (*)    Potassium 6.9 (*)    BUN 82 (*)    Creatinine, Ser 2.60 (*)    Glucose, Bld 103 (*)    Calcium, Ion 1.11 (*)    Hemoglobin 11.9 (*)    HCT 35.0 (*)    All other components within normal limits  CULTURE, BLOOD (ROUTINE X 2)  CULTURE, BLOOD (ROUTINE X 2)  URINALYSIS, ROUTINE W REFLEX MICROSCOPIC  RAPID URINE DRUG SCREEN, HOSP PERFORMED  I-STAT CG4 LACTIC ACID, ED  I-STAT TROPONIN, ED  I-STAT CG4 LACTIC ACID, ED    EKG EKG Interpretation  Date/Time:  Friday May 06 2018 15:55:19 EST Ventricular Rate:  73 PR Interval:    QRS Duration: 124 QT Interval:  399 QTC Calculation: 440 R Axis:   89 Text Interpretation:  Sinus rhythm Atrial premature complex Right bundle branch block Probable inferior infarct, old Baseline wander in lead(s) V6 Confirmed by Bethann Berkshire [phone]) on 05/06/2018 4:04:56 PM   Radiology Dg Chest Portable 1 View  Result Date: 05/06/2018 CLINICAL DATA:  Weakness EXAM: PORTABLE CHEST 1 VIEW COMPARISON:  None. FINDINGS: Airspace disease asymmetrically on the right, with volume loss. In the setting of code sepsis this is presumably pneumonia or aspiration. The left lung is clear. Normal heart size. Dorsal column stimulator leads. IMPRESSION: Asymmetric right lung opacity with volume loss, primarily concerning for pneumonia or aspiration. Electronically Signed   By: Marnee Spring M.D.   On: 05/06/2018 15:55    Procedures Procedures (including critical care time)  Medications Ordered in ED Medications  vancomycin (VANCOCIN) IVPB 1000 mg/200 mL premix (1,000 mg Intravenous New Bag/Given 05/06/18 1647)  sodium chloride 0.9 % bolus 2,000 mL (2,000 mLs Intravenous New Bag/Given 05/06/18 1602)  ondansetron (ZOFRAN) injection 4 mg (4 mg Intravenous Given 05/06/18 1602)  piperacillin-tazobactam (ZOSYN) IVPB 3.375 g ( Intravenous Stopped 05/06/18 1641)    naloxone (NARCAN) injection 0.4 mg (0.4 mg Intravenous Given 05/06/18 1646)     Initial Impression / Assessment and Plan / ED Course  I have reviewed the triage vital signs and the nursing notes.  Pertinent labs & imaging results that were available during my care of the patient were reviewed by me and considered in my medical decision making (see chart for details).     CRITICAL CARE Performed by: Bethann Berkshire Total critical care time:40 minutes Critical care time was exclusive of separately billable procedures and treating other patients. Critical care was necessary to treat or prevent imminent or life-threatening deterioration. Critical care was time spent personally by me on the following activities: development of treatment plan with patient and/or surrogate as well as nursing, discussions with consultants, evaluation of patient's response to treatment, examination of patient, obtaining history from patient or surrogate, ordering and performing treatments and interventions, ordering and review of laboratory studies, ordering and review of radiographic studies, pulse oximetry and re-evaluation of patient's condition.  Patient with AKI pneumonia and lethargy from narcotic use.  Patient is treated with sepsis protocol and has been bolused with normal saline along with antibiotics.  She will be admitted to medicine Final Clinical Impressions(s) / ED Diagnoses   Final diagnoses:  Community acquired pneumonia of  right lower lobe of lung [facility](HCC)    ED Discharge Orders    None       Bethann BerkshireZammit, Rayce Brahmbhatt, MD 05/06/18 1724

## 2018-05-07 LAB — RESPIRATORY PANEL BY PCR

## 2018-05-07 LAB — CBC
HCT: 31 % — ABNORMAL LOW (ref 36.0–46.0)
Hemoglobin: 9.8 g/dL — ABNORMAL LOW (ref 12.0–15.0)
MCH: 28.9 pg (ref 26.0–34.0)
MCHC: 31.6 g/dL (ref 30.0–36.0)
MCV: 91.4 fL (ref 80.0–100.0)
Platelets: 104 10*3/uL — ABNORMAL LOW (ref 150–400)
RBC: 3.39 MIL/uL — ABNORMAL LOW (ref 3.87–5.11)
RDW: 13 % (ref 11.5–15.5)
WBC: 9 10*3/uL (ref 4.0–10.5)
nRBC: 0 % (ref 0.0–0.2)

## 2018-05-07 LAB — BASIC METABOLIC PANEL
Anion gap: 4 — ABNORMAL LOW (ref 5–15)
BUN: 50 mg/dL — ABNORMAL HIGH (ref 8–23)
CO2: 21 mmol/L — ABNORMAL LOW (ref 22–32)
Calcium: 7.1 mg/dL — ABNORMAL LOW (ref 8.9–10.3)
Chloride: 114 mmol/L — ABNORMAL HIGH (ref 98–111)
Creatinine, Ser: 2.15 mg/dL — ABNORMAL HIGH (ref 0.44–1.00)
GFR calc Af Amer: 24 mL/min — ABNORMAL LOW (ref >60)
GFR calc non Af Amer: 21 mL/min — ABNORMAL LOW (ref >60)
Glucose, Bld: 124 mg/dL — ABNORMAL HIGH (ref 70–99)
Potassium: 4.2 mmol/L (ref 3.5–5.1)
Sodium: 139 mmol/L (ref 135–145)

## 2018-05-07 LAB — STREP PNEUMONIAE URINARY ANTIGEN: Strep Pneumo Urinary Antigen: NEGATIVE

## 2018-05-07 LAB — PROCALCITONIN: Procalcitonin: 32.57 ng/mL

## 2018-05-07 MED ORDER — IPRATROPIUM-ALBUTEROL 0.5-2.5 (3) MG/3ML IN SOLN
3.0000 mL | Freq: Four times a day (QID) | RESPIRATORY_TRACT | Status: DC | PRN
  Filled 2018-05-07: qty 3

## 2018-05-07 MED ORDER — HEPARIN SODIUM (PORCINE) 5000 UNIT/ML IJ SOLN
5000.0000 [IU] | Freq: Three times a day (TID) | INTRAMUSCULAR | Status: DC
  Administered 2018-05-07 – 2018-05-09 (×6): 5000 [IU] via SUBCUTANEOUS
  Filled 2018-05-07 (×6): qty 1

## 2018-05-07 MED ORDER — TRAMADOL HCL 50 MG PO TABS
50.0000 mg | ORAL_TABLET | Freq: Once | ORAL | Status: AC
  Administered 2018-05-07: 50 mg via ORAL
  Filled 2018-05-07: qty 1

## 2018-05-07 MED ORDER — SODIUM CHLORIDE 0.9 % IV BOLUS
500.0000 mL | Freq: Once | INTRAVENOUS | Status: AC
  Administered 2018-05-07: 500 mL via INTRAVENOUS

## 2018-05-07 MED ORDER — POLYETHYLENE GLYCOL 3350 17 G PO PACK: 17.0000 g | PACK | Freq: Every day | ORAL | Status: DC | PRN

## 2018-05-07 MED ORDER — SODIUM ZIRCONIUM CYCLOSILICATE 10 G PO PACK: 10.0000 g | PACK | Freq: Once | ORAL | Status: DC

## 2018-05-07 MED ORDER — TRAZODONE HCL 50 MG PO TABS
50.0000 mg | ORAL_TABLET | Freq: Once | ORAL | Status: AC
  Administered 2018-05-07: 50 mg via ORAL
  Filled 2018-05-07: qty 1

## 2018-05-07 NOTE — Progress Notes (Signed)
PROGRESS NOTE    Mckenzie [name]  UEA:[id number]:[id number] DOB: [birth year] DOA: 05/06/2018 PCP: Royann ShiversSkillman, Katherine E, PA-C   Brief Narrative:  Per HPI from Dr. Adrian BlackwaterStinson: Mckenzie ReiningDorothy W [name] is a 82 y.o. female with a history of back pain with tens implanted and chronic opiate use, coronary artery disease, diastolic dysfunction with preserved EF, COPD, hypothyroidism.  Patient seen for vomiting that started yesterday and is continued.  She has some mild shortness of breath but denies cough, fevers, chills, abdominal pain.  No palliating or provoking factors.  The patient has been taking her medications as normal.  She has felt a lot more sleepy than normal.  Patient is much less sleepy and more interactive this morning.  She is being treated with Rocephin and azithromycin for community-acquired pneumonia.  AKI appears to be improving with fluid hydration.  Assessment & Plan:   Principal Problem:   CAP (community acquired pneumonia) Active Problems:   GERD   Diastolic dysfunction   Back pain   CAD (coronary artery disease)   Hypothyroidism, postsurgical   Hypersomnolence   Acute renal failure (ARF) (HCC)   Dehydration   Hypotension   1. Community-acquired pneumonia.  Continue Rocephin and azithromycin and follow cultures.  Respiratory panel and urine strep antigen pending.  Procalcitonin noted to be elevated. 2. Metabolic encephalopathy-acute.  Appears to be improving with morphine being held at this time. 3. AKI.  Likely secondary to dehydration from vomiting.  Continue IV fluids and monitor BMP.  This is improving. 4. CAD.  Continue statin therapy. 5. History of diastolic CHF.  Continue to monitor daily weights.  Appears to be at baseline. 6. GERD.  Continue PPI. 7. Hypothyroidism.  Continue Synthroid. Check TSH.   DVT prophylaxis: Heparin Code Status: Full Family Communication: None at bedside Disposition Plan: Home in 24 to 48 hours once pneumonia is improved and kidney function is  improved. Transfer to floor.   Consultants:   None  Procedures:   None  Antimicrobials:   Rocephin and azithromycin 11/22->   Subjective: Patient seen and evaluated today with no new acute complaints or concerns. No acute concerns or events noted overnight.  She states that she is feeling better.  Objective: Vitals:   05/07/18 0600 05/07/18 0630 05/07/18 0721 05/07/18 1057  BP: (!) 89/35 (!) 95/39    Pulse: 81 82 94   Resp: 17 15 (!) 21   Temp:   98.7 F (37.1 C) 98.5 F (36.9 C)  TempSrc:   Oral Oral  SpO2: 100% 100% 93%   Weight: 55.2 kg     Height:        Intake/Output Summary (Last 24 hours) at 05/07/2018 1102 Last data filed at 05/07/2018 0708 Gross per 24 hour  Intake 3522.7 ml  Output 450 ml  Net 3072.7 ml   Filed Weights   05/06/18 1906 05/07/18 0600  Weight: 54 kg 55.2 kg    Examination:  General exam: Appears calm and comfortable  Respiratory system: Clear to auscultation. Respiratory effort normal. Cardiovascular system: S1 & S2 heard, RRR. No JVD, murmurs, rubs, gallops or clicks. No pedal edema. Gastrointestinal system: Abdomen is nondistended, soft and nontender. No organomegaly or masses felt. Normal bowel sounds heard. Central nervous system: Alert and oriented. No focal neurological deficits. Extremities: Symmetric 5 x 5 power. Skin: No rashes, lesions or ulcers Psychiatry: Judgement and insight appear normal. Mood & affect appropriate.     Data Reviewed: I have personally reviewed following labs and imaging studies  CBC:  Recent Labs  Lab 05/06/18 1538 05/06/18 1547 05/07/18 0537  WBC 12.4*  --  9.0  NEUTROABS 9.8*  --   --   HGB 12.0 11.9* 9.8*  HCT 38.0 35.0* 31.0*  MCV 89.8  --  91.4  PLT 125*  --  104*   Basic Metabolic Panel: Recent Labs  Lab 05/06/18 1538 05/06/18 1547 05/07/18 0537  NA 133* 132* 139  K 4.6 6.9* 4.2  CL 101 102 114*  CO2 22  --  21*  GLUCOSE 106* 103* 124*  BUN 55* 82* 50*  CREATININE 2.57*  2.60* 2.15*  CALCIUM 8.5*  --  7.1*   GFR: Estimated Creatinine Clearance: 18 mL/min (A) (by C-G formula based on SCr of 2.15 mg/dL (H)). Liver Function Tests: Recent Labs  Lab 05/06/18 1538  AST 30  ALT 17  ALKPHOS 69  BILITOT 1.1  PROT 6.4*  ALBUMIN 3.4*   No results for input(s): LIPASE, AMYLASE in the last 168 hours. No results for input(s): AMMONIA in the last 168 hours. Coagulation Profile: No results for input(s): INR, PROTIME in the last 168 hours. Cardiac Enzymes: No results for input(s): CKTOTAL, CKMB, CKMBINDEX, TROPONINI in the last 168 hours. BNP (last 3 results) No results for input(s): PROBNP in the last 8760 hours. HbA1C: No results for input(s): HGBA1C in the last 72 hours. CBG: No results for input(s): GLUCAP in the last 168 hours. Lipid Profile: No results for input(s): CHOL, HDL, LDLCALC, TRIG, CHOLHDL, LDLDIRECT in the last 72 hours. Thyroid Function Tests: No results for input(s): TSH, T4TOTAL, FREET4, T3FREE, THYROIDAB in the last 72 hours. Anemia Panel: No results for input(s): VITAMINB12, FOLATE, FERRITIN, TIBC, IRON, RETICCTPCT in the last 72 hours. Sepsis Labs: Recent Labs  Lab 05/06/18 1554 05/06/18 1754 05/07/18 0537  PROCALCITON 69.41  --  32.57  LATICACIDVEN 1.61 1.16  --     Recent Results (from the past 240 hour(s))  MRSA PCR Screening     Status: None   Collection Time: 05/06/18  7:04 PM  Result Value Ref Range Status   MRSA by PCR NEGATIVE NEGATIVE Final    Comment:        The GeneXpert MRSA Assay (FDA approved for NASAL specimens only), is one component of a comprehensive MRSA colonization surveillance program. It is not intended to diagnose MRSA infection nor to guide or monitor treatment for MRSA infections. Performed at [facility], [address] [place], [place] [address]          Radiology Studies: Dg Chest Portable 1 View  Result Date: 05/06/2018 CLINICAL DATA:  Weakness EXAM: PORTABLE CHEST 1 VIEW  COMPARISON:  None. FINDINGS: Airspace disease asymmetrically on the right, with volume loss. In the setting of code sepsis this is presumably pneumonia or aspiration. The left lung is clear. Normal heart size. Dorsal column stimulator leads. IMPRESSION: Asymmetric right lung opacity with volume loss, primarily concerning for pneumonia or aspiration. Electronically Signed   By: Marnee Spring M.D.   On: 05/06/2018 15:55        Scheduled Meds: . aspirin EC  81 mg Oral Daily  . atorvastatin  10 mg Oral Daily  . enoxaparin (LOVENOX) injection  30 mg Subcutaneous Q24H  . levothyroxine  75 mcg Oral QAC breakfast  . pantoprazole  40 mg Oral Daily   Continuous Infusions: . sodium chloride 100 mL/hr at 05/07/18 1034  . azithromycin Stopped (05/06/18 2309)  . cefTRIAXone (ROCEPHIN)  IV Stopped (05/06/18 2104)     LOS:  1 day    Time spent: 30 minutes    Pratik Hoover Brunette, DO [facility] Pager [phone]  If 7PM-7AM, please contact night-coverage [email or web address] Password [email or web address] 05/07/2018, 11:02 AM

## 2018-05-08 LAB — CBC
HCT: 33.7 % — ABNORMAL LOW (ref 36.0–46.0)
Hemoglobin: 10.4 g/dL — ABNORMAL LOW (ref 12.0–15.0)
MCH: 28 pg (ref 26.0–34.0)
MCHC: 30.9 g/dL (ref 30.0–36.0)
MCV: 90.6 fL (ref 80.0–100.0)
Platelets: 113 10*3/uL — ABNORMAL LOW (ref 150–400)
RBC: 3.72 MIL/uL — ABNORMAL LOW (ref 3.87–5.11)
RDW: 13.2 % (ref 11.5–15.5)
WBC: 8 10*3/uL (ref 4.0–10.5)
nRBC: 0 % (ref 0.0–0.2)

## 2018-05-08 LAB — BASIC METABOLIC PANEL
Anion gap: 6 (ref 5–15)
BUN: 26 mg/dL — ABNORMAL HIGH (ref 8–23)
CO2: 20 mmol/L — ABNORMAL LOW (ref 22–32)
Calcium: 7.8 mg/dL — ABNORMAL LOW (ref 8.9–10.3)
Chloride: 116 mmol/L — ABNORMAL HIGH (ref 98–111)
Creatinine, Ser: 1.1 mg/dL — ABNORMAL HIGH (ref 0.44–1.00)
GFR calc Af Amer: 53 mL/min — ABNORMAL LOW (ref >60)
GFR calc non Af Amer: 46 mL/min — ABNORMAL LOW (ref >60)
Glucose, Bld: 79 mg/dL (ref 70–99)
Potassium: 3.5 mmol/L (ref 3.5–5.1)
Sodium: 142 mmol/L (ref 135–145)

## 2018-05-08 LAB — PROCALCITONIN: Procalcitonin: 47.99 ng/mL

## 2018-05-08 LAB — HIV ANTIBODY (ROUTINE TESTING W REFLEX): HIV Screen 4th Generation wRfx: NONREACTIVE

## 2018-05-08 MED ORDER — MORPHINE SULFATE ER 15 MG PO TBCR
30.0000 mg | EXTENDED_RELEASE_TABLET | Freq: Two times a day (BID) | ORAL | Status: DC
  Administered 2018-05-08 (×2): 30 mg via ORAL
  Filled 2018-05-08 (×3): qty 2

## 2018-05-08 MED ORDER — TRAMADOL HCL 50 MG PO TABS
50.0000 mg | ORAL_TABLET | Freq: Once | ORAL | Status: AC
  Administered 2018-05-08: 50 mg via ORAL
  Filled 2018-05-08: qty 1

## 2018-05-08 NOTE — Progress Notes (Signed)
PT has an implanted pain stimulator located on her Right Lower Back. PT does not have the supplies needed to use the stimulator. PT reports pain. PT receives Morphine 30mg  BID at home, and does not have any pain medication ordered PRN or scheduled currently. Continue to monitor PT and s/s of pain or discomfort.

## 2018-05-08 NOTE — Progress Notes (Signed)
PROGRESS NOTE    Mckenzie [name]  ZHY:[id number] DOB: February 10, 1936 DOA: 05/06/2018 PCP: Royann Shivers, PA-C   Brief Narrative:  Per HPI from Dr. Adrian Blackwater: Mckenzie Griffins Johnsonis a 82 y.o.femalewith a history of back pain with tens implanted and chronic opiate use, coronary artery disease, diastolic dysfunction with preserved EF, COPD, hypothyroidism. Patient seen for vomiting that started yesterday and is continued. She has some mild shortness of breath but denies cough, fevers, chills, abdominal pain. No palliating or provoking factors. The patient has been taking her medications as normal. She hasfelt a lot more sleepy than normal.  Patient is much less sleepy and more interactive this morning.  She is being treated with Rocephin and azithromycin for community-acquired pneumonia.  AKI appears to be improving with fluid hydration.  Assessment & Plan:   Principal Problem:   CAP (community acquired pneumonia) Active Problems:   GERD   Diastolic dysfunction   Back pain   CAD (coronary artery disease)   Hypothyroidism, postsurgical   Hypersomnolence   Acute renal failure (ARF) (HCC)   Dehydration   Hypotension  1. Community-acquired pneumonia.  Continue Rocephin and azithromycin and follow cultures.    Respiratory panel and urine strep antigen both within normal limits.  Continue IV antibiotics through today and anticipate transition to oral azithromycin and Cefdinir on discharge. 2. Metabolic encephalopathy-acute.  Appears to be back to baseline with improvement in AKI and narcotics out of her system.  She does appear to be having some symptoms of withdrawal with diarrhea and therefore, will restart her morphine and see how she starts to feel. 3. AKI-resolved.  Likely secondary to dehydration from vomiting.  Continue IV fluids and monitor BMP once again in a.m. 4. CAD.  Continue statin therapy. 5. History of diastolic CHF.  Continue to monitor daily weights.  Appears to  be at baseline. 6. GERD.  Continue PPI. 7. Hypothyroidism.  Continue Synthroid. Check TSH.   DVT prophylaxis: Heparin Code Status: Full Family Communication: None at bedside Disposition Plan: Home hopefully by a.m. once diarrhea resolves and patient is tolerating some of her oral narcotic medications.   Consultants:   None  Procedures:   None  Antimicrobials:   Rocephin and azithromycin 11/22->   Subjective: Patient seen and evaluated today with no new acute complaints or concerns. No acute concerns or events noted overnight.  She states that she is feeling better overall but is having some mild shakiness and loose stools.  She denies any significant pain and overall rates her pain level 5/10.  Objective: Vitals:   05/08/18 0300 05/08/18 0600 05/08/18 0715 05/08/18 1058  BP: (!) 130/52 (!) 157/52    Pulse: 86 93 (!) 102 91  Resp: 17 (!) 21 (!) 21 19  Temp:   98.5 F (36.9 C) 98.4 F (36.9 C)  TempSrc:   Oral Oral  SpO2: 99% 97% 98% 99%  Weight:      Height:        Intake/Output Summary (Last 24 hours) at 05/08/2018 1132 Last data filed at 05/08/2018 0100 Gross per 24 hour  Intake 892.8 ml  Output -  Net 892.8 ml   Filed Weights   05/06/18 1906 05/07/18 0600  Weight: 54 kg 55.2 kg    Examination:  General exam: Appears calm and comfortable  Respiratory system: Clear to auscultation. Respiratory effort normal. On [place]. Cardiovascular system: S1 & S2 heard, RRR. No JVD, murmurs, rubs, gallops or clicks. No pedal edema. Gastrointestinal system: Abdomen is nondistended, soft  and nontender. No organomegaly or masses felt. Normal bowel sounds heard. Central nervous system: Alert and oriented. No focal neurological deficits. Extremities: Symmetric 5 x 5 power. Skin: No rashes, lesions or ulcers Psychiatry: Judgement and insight appear normal. Mood & affect appropriate.     Data Reviewed: I have personally reviewed following labs and imaging  studies  CBC: Recent Labs  Lab 05/06/18 1538 05/06/18 1547 05/07/18 0537 05/08/18 0424  WBC 12.4*  --  9.0 8.0  NEUTROABS 9.8*  --   --   --   HGB 12.0 11.9* 9.8* 10.4*  HCT 38.0 35.0* 31.0* 33.7*  MCV 89.8  --  91.4 90.6  PLT 125*  --  104* 113*   Basic Metabolic Panel: Recent Labs  Lab 05/06/18 1538 05/06/18 1547 05/07/18 0537 05/08/18 0424  NA 133* 132* 139 142  K 4.6 6.9* 4.2 3.5  CL 101 102 114* 116*  CO2 22  --  21* 20*  GLUCOSE 106* 103* 124* 79  BUN 55* 82* 50* 26*  CREATININE 2.57* 2.60* 2.15* 1.10*  CALCIUM 8.5*  --  7.1* 7.8*   GFR: Estimated Creatinine Clearance: 35.2 mL/min (A) (by C-G formula based on SCr of 1.1 mg/dL (H)). Liver Function Tests: Recent Labs  Lab 05/06/18 1538  AST 30  ALT 17  ALKPHOS 69  BILITOT 1.1  PROT 6.4*  ALBUMIN 3.4*   No results for input(s): LIPASE, AMYLASE in the last 168 hours. No results for input(s): AMMONIA in the last 168 hours. Coagulation Profile: No results for input(s): INR, PROTIME in the last 168 hours. Cardiac Enzymes: No results for input(s): CKTOTAL, CKMB, CKMBINDEX, TROPONINI in the last 168 hours. BNP (last 3 results) No results for input(s): PROBNP in the last 8760 hours. HbA1C: No results for input(s): HGBA1C in the last 72 hours. CBG: No results for input(s): GLUCAP in the last 168 hours. Lipid Profile: No results for input(s): CHOL, HDL, LDLCALC, TRIG, CHOLHDL, LDLDIRECT in the last 72 hours. Thyroid Function Tests: No results for input(s): TSH, T4TOTAL, FREET4, T3FREE, THYROIDAB in the last 72 hours. Anemia Panel: No results for input(s): VITAMINB12, FOLATE, FERRITIN, TIBC, IRON, RETICCTPCT in the last 72 hours. Sepsis Labs: Recent Labs  Lab 05/06/18 1554 05/06/18 1754 05/07/18 0537 05/08/18 0424  PROCALCITON 69.41  --  32.57 47.99  LATICACIDVEN 1.61 1.16  --   --     Recent Results (from the past 240 hour(s))  Blood Culture (routine x 2)     Status: None (Preliminary result)    Collection Time: 05/06/18  3:41 PM  Result Value Ref Range Status   Specimen Description BLOOD RIGHT ARM DRAWN BY RN  Final   Special Requests   Final    BOTTLES DRAWN AEROBIC AND ANAEROBIC Blood Culture results may not be optimal due to an inadequate volume of blood received in culture bottles   Culture   Final    NO GROWTH 2 DAYS Performed at [facility], [address] [place], [place] [address]    Report Status PENDING  Incomplete  Blood Culture (routine x 2)     Status: None (Preliminary result)   Collection Time: 05/06/18  3:45 PM  Result Value Ref Range Status   Specimen Description BLOOD RIGHT ANTECUBITAL  Final   Special Requests   Final    BOTTLES DRAWN AEROBIC AND ANAEROBIC Blood Culture results may not be optimal due to an inadequate volume of blood received in culture bottles   Culture   Final  NO GROWTH 2 DAYS Performed at [facility], [address] [place], [place] [address]    Report Status PENDING  Incomplete  Respiratory Panel by PCR     Status: None   Collection Time: 05/06/18  7:04 PM  Result Value Ref Range Status   Adenovirus NOT DETECTED NOT DETECTED Final   Coronavirus 229E NOT DETECTED NOT DETECTED Final   Coronavirus HKU1 NOT DETECTED NOT DETECTED Final   Coronavirus NL63 NOT DETECTED NOT DETECTED Final   Coronavirus OC43 NOT DETECTED NOT DETECTED Final   Metapneumovirus NOT DETECTED NOT DETECTED Final   Rhinovirus / Enterovirus NOT DETECTED NOT DETECTED Final   Influenza A NOT DETECTED NOT DETECTED Final   Influenza B NOT DETECTED NOT DETECTED Final   Parainfluenza Virus 1 NOT DETECTED NOT DETECTED Final   Parainfluenza Virus 2 NOT DETECTED NOT DETECTED Final   Parainfluenza Virus 3 NOT DETECTED NOT DETECTED Final   Parainfluenza Virus 4 NOT DETECTED NOT DETECTED Final   Respiratory Syncytial Virus NOT DETECTED NOT DETECTED Final   Bordetella pertussis NOT DETECTED NOT DETECTED Final   Chlamydophila pneumoniae NOT DETECTED NOT DETECTED Final    Mycoplasma pneumoniae NOT DETECTED NOT DETECTED Final    Comment: Performed at [facility], [address] [place], [place] [address]  MRSA PCR Screening     Status: None   Collection Time: 05/06/18  7:04 PM  Result Value Ref Range Status   MRSA by PCR NEGATIVE NEGATIVE Final    Comment:        The GeneXpert MRSA Assay (FDA approved for NASAL specimens only), is one component of a comprehensive MRSA colonization surveillance program. It is not intended to diagnose MRSA infection nor to guide or monitor treatment for MRSA infections. Performed at [facility], [address] [place], [place] [address]          Radiology Studies: Dg Chest Portable 1 View  Result Date: 05/06/2018 CLINICAL DATA:  Weakness EXAM: PORTABLE CHEST 1 VIEW COMPARISON:  None. FINDINGS: Airspace disease asymmetrically on the right, with volume loss. In the setting of code sepsis this is presumably pneumonia or aspiration. The left lung is clear. Normal heart size. Dorsal column stimulator leads. IMPRESSION: Asymmetric right lung opacity with volume loss, primarily concerning for pneumonia or aspiration. Electronically Signed   By: Marnee SpringJonathon  Watts M.D.   On: 05/06/2018 15:55        Scheduled Meds: . aspirin EC  81 mg Oral Daily  . atorvastatin  10 mg Oral Daily  . heparin injection (subcutaneous)  5,000 Units Subcutaneous Q8H  . levothyroxine  75 mcg Oral QAC breakfast  . Morphine Sulfate ER  1 tablet Oral BID  . pantoprazole  40 mg Oral Daily   Continuous Infusions: . azithromycin Stopped (05/07/18 2047)  . cefTRIAXone (ROCEPHIN)  IV Stopped (05/07/18 1930)     LOS: 2 days    Time spent: 30 minutes    Cathalina Barcia Hoover Brunette Saren Corkern, DO [facility] Pager [phone]  If 7PM-7AM, please contact night-coverage [email or web address] Password [email or web address] 05/08/2018, 11:32 AM

## 2018-05-09 LAB — BASIC METABOLIC PANEL
Anion gap: 5 (ref 5–15)
BUN: 12 mg/dL (ref 8–23)
CO2: 22 mmol/L (ref 22–32)
Calcium: 7.8 mg/dL — ABNORMAL LOW (ref 8.9–10.3)
Chloride: 115 mmol/L — ABNORMAL HIGH (ref 98–111)
Creatinine, Ser: 0.94 mg/dL (ref 0.44–1.00)
GFR calc Af Amer: >60 mL/min (ref >60)
GFR calc non Af Amer: 56 mL/min — ABNORMAL LOW (ref >60)
Glucose, Bld: 96 mg/dL (ref 70–99)
Potassium: 3.4 mmol/L — ABNORMAL LOW (ref 3.5–5.1)
Sodium: 142 mmol/L (ref 135–145)

## 2018-05-09 LAB — CBC
HCT: 31.3 % — ABNORMAL LOW (ref 36.0–46.0)
Hemoglobin: 9.8 g/dL — ABNORMAL LOW (ref 12.0–15.0)
MCH: 28.2 pg (ref 26.0–34.0)
MCHC: 31.3 g/dL (ref 30.0–36.0)
MCV: 89.9 fL (ref 80.0–100.0)
Platelets: 118 10*3/uL — ABNORMAL LOW (ref 150–400)
RBC: 3.48 MIL/uL — ABNORMAL LOW (ref 3.87–5.11)
RDW: 13.1 % (ref 11.5–15.5)
WBC: 6 10*3/uL (ref 4.0–10.5)
nRBC: 0 % (ref 0.0–0.2)

## 2018-05-09 MED ORDER — AZITHROMYCIN 500 MG PO TABS: 500.0000 mg | ORAL_TABLET | Freq: Every day | ORAL | 0 refills | Status: AC

## 2018-05-09 MED ORDER — POTASSIUM CHLORIDE CRYS ER 20 MEQ PO TBCR
40.0000 meq | EXTENDED_RELEASE_TABLET | Freq: Once | ORAL | Status: AC
  Administered 2018-05-09: 40 meq via ORAL
  Filled 2018-05-09: qty 2

## 2018-05-09 MED ORDER — CEFDINIR 300 MG PO CAPS: 300.0000 mg | ORAL_CAPSULE | Freq: Two times a day (BID) | ORAL | 0 refills | Status: AC

## 2018-05-09 MED ORDER — IPRATROPIUM-ALBUTEROL 20-100 MCG/ACT IN AERS: 1.0000 | INHALATION_SPRAY | Freq: Four times a day (QID) | RESPIRATORY_TRACT | 2 refills | Status: DC | PRN

## 2018-05-09 NOTE — Discharge Summary (Signed)
Physician Discharge Summary  Mckenzie [name] ZYS:[id number] DOB: July 15, 1935 DOA: 05/06/2018  PCP: Royann Shivers, PA-C  Admit date: 05/06/2018  Discharge date: 05/09/2018  Admitted From:Home  Disposition:  Home  Recommendations for Outpatient Follow-up:  1. Follow up with PCP in 1-2 weeks 2. Continue azithromycin and Cefdinir for 3 more days to finish a total 7-day course of treatment for pneumonia 3. Withhold Lasix for the next few days until diarrhea improves which is suspected to be related to narcotic withdrawal which she will now resume 4. Follow-up with pain clinic in 2 weeks  Home Health: None  Equipment/Devices: Has home 2 L nasal cannula at nighttime  Discharge Condition: Stable  CODE STATUS: Full  Diet recommendation: Heart Healthy  Brief/Interim Summary: Per HPI from Dr. Adrian Blackwater: Mckenzie Griffins Johnsonis a 82 y.o.femalewith a history of back pain with tens implanted and chronic opiate use, coronary artery disease, diastolic dysfunction with preserved EF, COPD, hypothyroidism. Patient seen for vomiting that started yesterday and is continued. She has some mild shortness of breath but denies cough, fevers, chills, abdominal pain. No palliating or provoking factors. The patient has been taking her medications as normal. She hasfelt a lot more sleepy than normal.  Patient was noted to have right lobar pneumonia on chest x-ray with negative strep pneumonia antigen and respiratory viral panel that was also within normal limits.  She was started on azithromycin and Rocephin with significant improvement in her symptoms over the course of 2 to 3 days.  Blood cultures have demonstrated no growth over 48 hours.  Her acute encephalopathy also improved with improvement in her renal function and withholding of narcotics.  It is suspected that her combination of pneumonia as well as buildup of toxic metabolites due to AKI were causing her clinical issues.  She is doing much  better at this point with no other issues noted throughout the course of this admission.  She will continue her home antibiotics for 3 more days and will be given some Combivent for respiratory issues as well.  She is having some mild diarrhea which should improve within next 2 to 3 days as she resumes her home narcotics.  She denies any abdominal pain and continues to have her baseline level of appetite.  She remains on 2 L nasal cannula at home at bedtime.  Discharge Diagnoses:  Principal Problem:   CAP (community acquired pneumonia) Active Problems:   GERD   Diastolic dysfunction   Back pain   CAD (coronary artery disease)   Hypothyroidism, postsurgical   Hypersomnolence   Acute renal failure (ARF) (HCC)   Dehydration   Hypotension  Principal discharge diagnosis: Community-acquired pneumonia.  Discharge Instructions  Discharge Instructions    Diet - low sodium heart healthy   Complete by:  As directed    Increase activity slowly   Complete by:  As directed      Allergies as of 05/09/2018      Reactions   Tetracycline Nausea Only      Medication List    STOP taking these medications   furosemide 20 MG tablet Commonly known as:  LASIX   MELATONIN PO   polyethylene glycol powder powder Commonly known as:  GLYCOLAX/MIRALAX     TAKE these medications   aspirin 81 MG tablet Take 81 mg by mouth daily.   atorvastatin 10 MG tablet Commonly known as:  LIPITOR Take 10 mg by mouth daily.   azithromycin 500 MG tablet Commonly known as:  ZITHROMAX Take 1  tablet (500 mg total) by mouth daily for 3 days. Take 1 tablet daily for 3 days.   benazepril 5 MG tablet Commonly known as:  LOTENSIN Take 5 mg by mouth daily.   calcium-vitamin D 500-200 MG-UNIT tablet Commonly known as:  OSCAL WITH D Take 1 tablet by mouth daily.   cefdinir 300 MG capsule Commonly known as:  OMNICEF Take 1 capsule (300 mg total) by mouth 2 (two) times daily for 3 days.   diphenhydrAMINE 25  mg capsule Commonly known as:  BENADRYL Take 25 mg by mouth daily as needed for sleep.   EMBEDA 50-2 MG Cpcr Generic drug:  Morphine-Naltrexone Take by mouth 2 (two) times daily.   esomeprazole 40 MG capsule Commonly known as:  NEXIUM Take 40 mg by mouth daily before breakfast.   folic acid 800 MCG tablet Commonly known as:  FOLVITE Take 800 mcg by mouth daily.   hyoscyamine 0.125 MG SL tablet Commonly known as:  LEVSIN SL Take 0.125 mg by mouth every 6 (six) hours as needed for cramping or diarrhea or loose stools.   Ipratropium-Albuterol 20-100 MCG/ACT Aers respimat Commonly known as:  COMBIVENT Inhale 1 puff into the lungs every 6 (six) hours as needed for wheezing or shortness of breath.   Iron 240 (27 Fe) MG Tabs Take 1 tablet by mouth daily.   levothyroxine 75 MCG tablet Commonly known as:  SYNTHROID, LEVOTHROID Take 75 mcg by mouth daily before breakfast.   metoprolol tartrate 25 MG tablet Commonly known as:  LOPRESSOR TAKE (1) TABLET TWICE DAILY.   MORPHABOND ER 30 MG T12a Generic drug:  Morphine Sulfate ER Take 1 tablet by mouth 2 (two) times daily.   multivitamin tablet Take 1 tablet by mouth daily.   nitrofurantoin 50 MG capsule Commonly known as:  MACRODANTIN Take 1 capsule by mouth daily.   nitroGLYCERIN 0.4 MG SL tablet Commonly known as:  NITROSTAT Place one tablet under tongue every 5 minutes up to 3 doses as needed for chest pain. No more than 3 doses over a 15 minute period.   potassium chloride 10 MEQ tablet Commonly known as:  K-DUR,KLOR-CON Take 2 tablets (20 mEq total) by mouth daily.   temazepam 30 MG capsule Commonly known as:  RESTORIL Take 30 mg by mouth at bedtime.   tretinoin 0.05 % cream Commonly known as:  RETIN-A Apply 1 application topically at bedtime.   Vitamin D3 25 MCG (1000 UT) Caps Take 1,000 Units by mouth daily. Take one tab daily      Follow-up Information    Royann Shivers, PA-C Follow up in 1  week(s).   Specialty:  Physician Assistant Contact information: [address] [place] [address] [phone]        Laqueta Linden, MD .   Specialty:  Cardiology Contact information: [address] [place] [address] [phone]          Allergies  Allergen Reactions  . Tetracycline Nausea Only    Consultations:  None   Procedures/Studies: Dg Chest Portable 1 View  Result Date: 05/06/2018 CLINICAL DATA:  Weakness EXAM: PORTABLE CHEST 1 VIEW COMPARISON:  None. FINDINGS: Airspace disease asymmetrically on the right, with volume loss. In the setting of code sepsis this is presumably pneumonia or aspiration. The left lung is clear. Normal heart size. Dorsal column stimulator leads. IMPRESSION: Asymmetric right lung opacity with volume loss, primarily concerning for pneumonia or aspiration. Electronically Signed   By: Marnee Spring  M.D.   On: 05/06/2018 15:55     Discharge Exam: Vitals:   05/09/18 0000 05/09/18 0440  BP: 131/69   Pulse: 86   Resp:    Temp:  98.4 F (36.9 C)  SpO2: 97%    Vitals:   05/08/18 2015 05/08/18 2324 05/09/18 0000 05/09/18 0440  BP: (!) 166/67  131/69   Pulse: 87  86   Resp: 20     Temp: 99.5 F (37.5 C) 99.1 F (37.3 C)  98.4 F (36.9 C)  TempSrc: Oral Oral  Oral  SpO2: 98%  97%   Weight:    54.2 kg  Height:        General: Pt is alert, awake, not in acute distress Cardiovascular: RRR, S1/S2 +, no rubs, no gallops Respiratory: CTA bilaterally, no wheezing, no rhonchi, on 2 L oxygen. Abdominal: Soft, NT, ND, bowel sounds + Extremities: no edema, no cyanosis    The results of significant diagnostics from this hospitalization (including imaging, microbiology, ancillary and laboratory) are listed below for reference.     Microbiology: Recent Results (from the past 240 hour(s))  Blood Culture (routine x 2)     Status: None (Preliminary result)   Collection Time: 05/06/18  3:41 PM  Result Value Ref Range  Status   Specimen Description BLOOD RIGHT ARM DRAWN BY RN  Final   Special Requests   Final    BOTTLES DRAWN AEROBIC AND ANAEROBIC Blood Culture results may not be optimal due to an inadequate volume of blood received in culture bottles   Culture   Final    NO GROWTH 2 DAYS Performed at [facility], [address] [place], [place] [address]    Report Status PENDING  Incomplete  Blood Culture (routine x 2)     Status: None (Preliminary result)   Collection Time: 05/06/18  3:45 PM  Result Value Ref Range Status   Specimen Description BLOOD RIGHT ANTECUBITAL  Final   Special Requests   Final    BOTTLES DRAWN AEROBIC AND ANAEROBIC Blood Culture results may not be optimal due to an inadequate volume of blood received in culture bottles   Culture   Final    NO GROWTH 2 DAYS Performed at [facility], [address] [place], [place] [address]    Report Status PENDING  Incomplete  Respiratory Panel by PCR     Status: None   Collection Time: 05/06/18  7:04 PM  Result Value Ref Range Status   Adenovirus NOT DETECTED NOT DETECTED Final   Coronavirus 229E NOT DETECTED NOT DETECTED Final   Coronavirus HKU1 NOT DETECTED NOT DETECTED Final   Coronavirus NL63 NOT DETECTED NOT DETECTED Final   Coronavirus OC43 NOT DETECTED NOT DETECTED Final   Metapneumovirus NOT DETECTED NOT DETECTED Final   Rhinovirus / Enterovirus NOT DETECTED NOT DETECTED Final   Influenza A NOT DETECTED NOT DETECTED Final   Influenza B NOT DETECTED NOT DETECTED Final   Parainfluenza Virus 1 NOT DETECTED NOT DETECTED Final   Parainfluenza Virus 2 NOT DETECTED NOT DETECTED Final   Parainfluenza Virus 3 NOT DETECTED NOT DETECTED Final   Parainfluenza Virus 4 NOT DETECTED NOT DETECTED Final   Respiratory Syncytial Virus NOT DETECTED NOT DETECTED Final   Bordetella pertussis NOT DETECTED NOT DETECTED Final   Chlamydophila pneumoniae NOT DETECTED NOT DETECTED Final   Mycoplasma pneumoniae NOT DETECTED NOT DETECTED Final     Comment: Performed at [facility], [address] [place], [place] [address]  MRSA PCR Screening     Status: None   Collection Time: 05/06/18  7:04 PM  Result Value Ref Range Status   MRSA by PCR NEGATIVE NEGATIVE Final    Comment:        The GeneXpert MRSA Assay (FDA approved for NASAL specimens only), is one component of a comprehensive MRSA colonization surveillance program. It is not intended to diagnose MRSA infection nor to guide or monitor treatment for MRSA infections. Performed at [facility], [address], [place], [place] [address]      Labs: BNP (last 3 results) No results for input(s): BNP in the last 8760 hours. Basic Metabolic Panel: Recent Labs  Lab 05/06/18 1538 05/06/18 1547 05/07/18 0537 05/08/18 0424 05/09/18 0432  NA 133* 132* 139 142 142  K 4.6 6.9* 4.2 3.5 3.4*  CL 101 102 114* 116* 115*  CO2 22  --  21* 20* 22  GLUCOSE 106* 103* 124* 79 96  BUN 55* 82* 50* 26* 12  CREATININE 2.57* 2.60* 2.15* 1.10* 0.94  CALCIUM 8.5*  --  7.1* 7.8* 7.8*   Liver Function Tests: Recent Labs  Lab 05/06/18 1538  AST 30  ALT 17  ALKPHOS 69  BILITOT 1.1  PROT 6.4*  ALBUMIN 3.4*   No results for input(s): LIPASE, AMYLASE in the last 168 hours. No results for input(s): AMMONIA in the last 168 hours. CBC: Recent Labs  Lab 05/06/18 1538 05/06/18 1547 05/07/18 0537 05/08/18 0424 05/09/18 0432  WBC 12.4*  --  9.0 8.0 6.0  NEUTROABS 9.8*  --   --   --   --   HGB 12.0 11.9* 9.8* 10.4* 9.8*  HCT 38.0 35.0* 31.0* 33.7* 31.3*  MCV 89.8  --  91.4 90.6 89.9  PLT 125*  --  104* 113* 118*   Cardiac Enzymes: No results for input(s): CKTOTAL, CKMB, CKMBINDEX, TROPONINI in the last 168 hours. BNP: Invalid input(s): POCBNP CBG: No results for input(s): GLUCAP in the last 168 hours. D-Dimer No results for input(s): DDIMER in the last 72 hours. Hgb A1c No results for input(s): HGBA1C in the last 72 hours. Lipid Profile No results for input(s): CHOL,  HDL, LDLCALC, TRIG, CHOLHDL, LDLDIRECT in the last 72 hours. Thyroid function studies No results for input(s): TSH, T4TOTAL, T3FREE, THYROIDAB in the last 72 hours.  Invalid input(s): FREET3 Anemia work up No results for input(s): VITAMINB12, FOLATE, FERRITIN, TIBC, IRON, RETICCTPCT in the last 72 hours. Urinalysis    Component Value Date/Time   COLORURINE AMBER (A) 05/06/2018 1530   APPEARANCEUR CLOUDY (A) 05/06/2018 1530   LABSPEC 1.014 05/06/2018 1530   PHURINE 5.0 05/06/2018 1530   GLUCOSEU NEGATIVE 05/06/2018 1530   HGBUR MODERATE (A) 05/06/2018 1530   BILIRUBINUR NEGATIVE 05/06/2018 1530   KETONESUR NEGATIVE 05/06/2018 1530   PROTEINUR NEGATIVE 05/06/2018 1530   NITRITE NEGATIVE 05/06/2018 1530   LEUKOCYTESUR MODERATE (A) 05/06/2018 1530   Sepsis Labs Invalid input(s): PROCALCITONIN,  WBC,  LACTICIDVEN Microbiology Recent Results (from the past 240 hour(s))  Blood Culture (routine x 2)     Status: None (Preliminary result)   Collection Time: 05/06/18  3:41 PM  Result Value Ref Range Status   Specimen Description BLOOD RIGHT ARM DRAWN BY RN  Final   Special Requests   Final    BOTTLES DRAWN AEROBIC AND ANAEROBIC Blood Culture results may not be optimal due to an inadequate volume of blood received in culture bottles   Culture   Final    NO  GROWTH 2 DAYS Performed at [facility], [address], [place], [place] [address]    Report Status PENDING  Incomplete  Blood Culture (routine x 2)     Status: None (Preliminary result)   Collection Time: 05/06/18  3:45 PM  Result Value Ref Range Status   Specimen Description BLOOD RIGHT ANTECUBITAL  Final   Special Requests   Final    BOTTLES DRAWN AEROBIC AND ANAEROBIC Blood Culture results may not be optimal due to an inadequate volume of blood received in culture bottles   Culture   Final    NO GROWTH 2 DAYS Performed at [facility], [address] [place], [place] [address]    Report Status PENDING  Incomplete   Respiratory Panel by PCR     Status: None   Collection Time: 05/06/18  7:04 PM  Result Value Ref Range Status   Adenovirus NOT DETECTED NOT DETECTED Final   Coronavirus 229E NOT DETECTED NOT DETECTED Final   Coronavirus HKU1 NOT DETECTED NOT DETECTED Final   Coronavirus NL63 NOT DETECTED NOT DETECTED Final   Coronavirus OC43 NOT DETECTED NOT DETECTED Final   Metapneumovirus NOT DETECTED NOT DETECTED Final   Rhinovirus / Enterovirus NOT DETECTED NOT DETECTED Final   Influenza A NOT DETECTED NOT DETECTED Final   Influenza B NOT DETECTED NOT DETECTED Final   Parainfluenza Virus 1 NOT DETECTED NOT DETECTED Final   Parainfluenza Virus 2 NOT DETECTED NOT DETECTED Final   Parainfluenza Virus 3 NOT DETECTED NOT DETECTED Final   Parainfluenza Virus 4 NOT DETECTED NOT DETECTED Final   Respiratory Syncytial Virus NOT DETECTED NOT DETECTED Final   Bordetella pertussis NOT DETECTED NOT DETECTED Final   Chlamydophila pneumoniae NOT DETECTED NOT DETECTED Final   Mycoplasma pneumoniae NOT DETECTED NOT DETECTED Final    Comment: Performed at [facility], [address] [place], [place] [address]  MRSA PCR Screening     Status: None   Collection Time: 05/06/18  7:04 PM  Result Value Ref Range Status   MRSA by PCR NEGATIVE NEGATIVE Final    Comment:        The GeneXpert MRSA Assay (FDA approved for NASAL specimens only), is one component of a comprehensive MRSA colonization surveillance program. It is not intended to diagnose MRSA infection nor to guide or monitor treatment for MRSA infections. Performed at [facility], [address], [place], [place] [address]      Time coordinating discharge: 35 minutes  SIGNED:   Erick Blinks, DO [facility] 05/09/2018, 7:16 AM Pager [phone]  If 7PM-7AM, please contact night-coverage [email or web address] Password [email or web address]

## 2018-05-11 ENCOUNTER — Telehealth: Payer: Self-pay | Admitting: Cardiovascular Disease

## 2018-05-11 LAB — CULTURE, BLOOD (ROUTINE X 2)
Culture: NO GROWTH
Culture: NO GROWTH

## 2018-05-11 NOTE — Telephone Encounter (Signed)
[facility]  Wanted Dr Purvis SheffieldKoneswaran made aware of this and that they changed her medication

## 2018-05-11 NOTE — Telephone Encounter (Signed)
No answer

## 2018-05-16 ENCOUNTER — Other Ambulatory Visit: Payer: Self-pay

## 2018-05-16 NOTE — Patient Outreach (Signed)
[facility] Care Management  05/16/2018  Mckenzie ReiningDorothy W [name] 1935/10/10 [id number]   EMMI: general discharge RED alert Referral date: 05/16/18 Referral reason: lost interest in things: yes,  Sad/ hopeless; anxious/ empty: yes Insurance: Medicare Day # 4  Telephone call to patient regarding EMMI general discharge red alert. Attempted to speak with patient. Patient states she does not want to participate.   PLAN: R[facility] will close patient due to refusal of services.   George InaDavina Zakeria Kulzer RN,BSN,CCM [facility] Telephonic  [phone]

## 2018-05-17 DIAGNOSIS — N179 Acute kidney failure, unspecified: Secondary | ICD-10-CM | POA: Diagnosis not present

## 2018-05-17 DIAGNOSIS — R202 Paresthesia of skin: Secondary | ICD-10-CM | POA: Diagnosis not present

## 2018-05-17 DIAGNOSIS — E782 Mixed hyperlipidemia: Secondary | ICD-10-CM | POA: Diagnosis not present

## 2018-05-17 DIAGNOSIS — J159 Unspecified bacterial pneumonia: Secondary | ICD-10-CM | POA: Diagnosis not present

## 2018-05-17 DIAGNOSIS — Z6820 Body mass index (BMI) 20.0-20.9, adult: Secondary | ICD-10-CM | POA: Diagnosis not present

## 2018-05-17 DIAGNOSIS — M545 Low back pain: Secondary | ICD-10-CM | POA: Diagnosis not present

## 2018-05-17 DIAGNOSIS — Z13220 Encounter for screening for lipoid disorders: Secondary | ICD-10-CM | POA: Diagnosis not present

## 2018-05-26 ENCOUNTER — Ambulatory Visit: Payer: Medicare Other | Admitting: Student

## 2018-05-26 DIAGNOSIS — G894 Chronic pain syndrome: Secondary | ICD-10-CM | POA: Diagnosis not present

## 2018-05-26 DIAGNOSIS — M79605 Pain in left leg: Secondary | ICD-10-CM | POA: Diagnosis not present

## 2018-05-26 DIAGNOSIS — M545 Low back pain: Secondary | ICD-10-CM | POA: Diagnosis not present

## 2018-06-01 DIAGNOSIS — H1851 Endothelial corneal dystrophy: Secondary | ICD-10-CM | POA: Diagnosis not present

## 2018-06-01 DIAGNOSIS — H26493 Other secondary cataract, bilateral: Secondary | ICD-10-CM | POA: Diagnosis not present

## 2018-06-01 DIAGNOSIS — Z961 Presence of intraocular lens: Secondary | ICD-10-CM | POA: Diagnosis not present

## 2018-06-01 DIAGNOSIS — H524 Presbyopia: Secondary | ICD-10-CM | POA: Diagnosis not present

## 2018-06-08 DIAGNOSIS — R202 Paresthesia of skin: Secondary | ICD-10-CM | POA: Diagnosis not present

## 2018-06-08 DIAGNOSIS — M545 Low back pain: Secondary | ICD-10-CM | POA: Diagnosis not present

## 2018-06-08 DIAGNOSIS — J159 Unspecified bacterial pneumonia: Secondary | ICD-10-CM | POA: Diagnosis not present

## 2018-06-08 DIAGNOSIS — N179 Acute kidney failure, unspecified: Secondary | ICD-10-CM | POA: Diagnosis not present

## 2018-06-08 DIAGNOSIS — Z6820 Body mass index (BMI) 20.0-20.9, adult: Secondary | ICD-10-CM | POA: Diagnosis not present

## 2018-06-21 DIAGNOSIS — D649 Anemia, unspecified: Secondary | ICD-10-CM | POA: Diagnosis not present

## 2018-06-23 DIAGNOSIS — Z79899 Other long term (current) drug therapy: Secondary | ICD-10-CM | POA: Diagnosis not present

## 2018-06-23 DIAGNOSIS — M961 Postlaminectomy syndrome, not elsewhere classified: Secondary | ICD-10-CM | POA: Diagnosis not present

## 2018-06-23 DIAGNOSIS — M79605 Pain in left leg: Secondary | ICD-10-CM | POA: Diagnosis not present

## 2018-06-23 DIAGNOSIS — G894 Chronic pain syndrome: Secondary | ICD-10-CM | POA: Diagnosis not present

## 2018-06-23 DIAGNOSIS — M545 Low back pain: Secondary | ICD-10-CM | POA: Diagnosis not present

## 2018-06-23 DIAGNOSIS — Z79891 Long term (current) use of opiate analgesic: Secondary | ICD-10-CM | POA: Diagnosis not present

## 2018-07-14 DIAGNOSIS — R3914 Feeling of incomplete bladder emptying: Secondary | ICD-10-CM | POA: Diagnosis not present

## 2018-07-14 DIAGNOSIS — N3020 Other chronic cystitis without hematuria: Secondary | ICD-10-CM | POA: Diagnosis not present

## 2018-07-20 DIAGNOSIS — M4312 Spondylolisthesis, cervical region: Secondary | ICD-10-CM | POA: Diagnosis not present

## 2018-07-20 DIAGNOSIS — M47812 Spondylosis without myelopathy or radiculopathy, cervical region: Secondary | ICD-10-CM | POA: Diagnosis not present

## 2018-07-21 DIAGNOSIS — M545 Low back pain: Secondary | ICD-10-CM | POA: Diagnosis not present

## 2018-07-21 DIAGNOSIS — G894 Chronic pain syndrome: Secondary | ICD-10-CM | POA: Diagnosis not present

## 2018-07-21 DIAGNOSIS — M961 Postlaminectomy syndrome, not elsewhere classified: Secondary | ICD-10-CM | POA: Diagnosis not present

## 2018-07-21 DIAGNOSIS — M79605 Pain in left leg: Secondary | ICD-10-CM | POA: Diagnosis not present

## 2018-08-01 DIAGNOSIS — M5030 Other cervical disc degeneration, unspecified cervical region: Secondary | ICD-10-CM | POA: Diagnosis not present

## 2018-08-01 DIAGNOSIS — M47812 Spondylosis without myelopathy or radiculopathy, cervical region: Secondary | ICD-10-CM | POA: Diagnosis not present

## 2018-08-09 DIAGNOSIS — H6123 Impacted cerumen, bilateral: Secondary | ICD-10-CM | POA: Diagnosis not present

## 2018-08-09 DIAGNOSIS — Z681 Body mass index (BMI) 19 or less, adult: Secondary | ICD-10-CM | POA: Diagnosis not present

## 2018-08-09 DIAGNOSIS — R300 Dysuria: Secondary | ICD-10-CM | POA: Diagnosis not present

## 2018-08-16 DIAGNOSIS — G5603 Carpal tunnel syndrome, bilateral upper limbs: Secondary | ICD-10-CM | POA: Diagnosis not present

## 2018-08-18 DIAGNOSIS — Z79899 Other long term (current) drug therapy: Secondary | ICD-10-CM | POA: Diagnosis not present

## 2018-08-18 DIAGNOSIS — M961 Postlaminectomy syndrome, not elsewhere classified: Secondary | ICD-10-CM | POA: Diagnosis not present

## 2018-08-18 DIAGNOSIS — M79643 Pain in unspecified hand: Secondary | ICD-10-CM | POA: Diagnosis not present

## 2018-08-18 DIAGNOSIS — M79605 Pain in left leg: Secondary | ICD-10-CM | POA: Diagnosis not present

## 2018-08-18 DIAGNOSIS — Z79891 Long term (current) use of opiate analgesic: Secondary | ICD-10-CM | POA: Diagnosis not present

## 2018-08-18 DIAGNOSIS — G894 Chronic pain syndrome: Secondary | ICD-10-CM | POA: Diagnosis not present

## 2018-08-19 DIAGNOSIS — M25552 Pain in left hip: Secondary | ICD-10-CM | POA: Diagnosis not present

## 2018-08-19 DIAGNOSIS — M160 Bilateral primary osteoarthritis of hip: Secondary | ICD-10-CM | POA: Diagnosis not present

## 2018-08-25 DIAGNOSIS — G5613 Other lesions of median nerve, bilateral upper limbs: Secondary | ICD-10-CM | POA: Diagnosis not present

## 2018-08-29 DIAGNOSIS — Z681 Body mass index (BMI) 19 or less, adult: Secondary | ICD-10-CM | POA: Diagnosis not present

## 2018-08-29 DIAGNOSIS — N390 Urinary tract infection, site not specified: Secondary | ICD-10-CM | POA: Diagnosis not present

## 2018-08-31 DIAGNOSIS — Z6820 Body mass index (BMI) 20.0-20.9, adult: Secondary | ICD-10-CM | POA: Diagnosis not present

## 2018-08-31 DIAGNOSIS — G4700 Insomnia, unspecified: Secondary | ICD-10-CM | POA: Diagnosis not present

## 2018-09-12 DIAGNOSIS — Z681 Body mass index (BMI) 19 or less, adult: Secondary | ICD-10-CM | POA: Diagnosis not present

## 2018-09-12 DIAGNOSIS — N3001 Acute cystitis with hematuria: Secondary | ICD-10-CM | POA: Diagnosis not present

## 2018-09-12 DIAGNOSIS — R300 Dysuria: Secondary | ICD-10-CM | POA: Diagnosis not present

## 2018-09-13 ENCOUNTER — Other Ambulatory Visit: Payer: Self-pay | Admitting: Cardiovascular Disease

## 2018-09-15 DIAGNOSIS — M79605 Pain in left leg: Secondary | ICD-10-CM | POA: Diagnosis not present

## 2018-09-15 DIAGNOSIS — G894 Chronic pain syndrome: Secondary | ICD-10-CM | POA: Diagnosis not present

## 2018-09-15 DIAGNOSIS — G5613 Other lesions of median nerve, bilateral upper limbs: Secondary | ICD-10-CM | POA: Diagnosis not present

## 2018-09-15 DIAGNOSIS — M961 Postlaminectomy syndrome, not elsewhere classified: Secondary | ICD-10-CM | POA: Diagnosis not present

## 2018-09-22 ENCOUNTER — Telehealth: Payer: Self-pay | Admitting: *Deleted

## 2018-09-22 NOTE — Telephone Encounter (Signed)
   Primary Cardiologist:  Prentice Docker, MD   Patient contacted.  History reviewed.  No symptoms to suggest any unstable cardiac conditions.  Based on discussion, with current pandemic situation, we will be postponing this appointment for Mckenzie [name] with a plan for f/u in July 2020 or sooner if feasible/necessary.  If symptoms change, she has been instructed to contact our office.    [name], RN  09/22/2018 11:39 AM         .

## 2018-09-28 ENCOUNTER — Ambulatory Visit: Payer: Medicare Other | Admitting: Cardiovascular Disease

## 2018-09-28 DIAGNOSIS — N3020 Other chronic cystitis without hematuria: Secondary | ICD-10-CM | POA: Diagnosis not present

## 2018-09-28 DIAGNOSIS — N3946 Mixed incontinence: Secondary | ICD-10-CM | POA: Diagnosis not present

## 2018-09-28 DIAGNOSIS — R3914 Feeling of incomplete bladder emptying: Secondary | ICD-10-CM | POA: Diagnosis not present

## 2018-09-28 DIAGNOSIS — N816 Rectocele: Secondary | ICD-10-CM | POA: Diagnosis not present

## 2018-10-11 DIAGNOSIS — M25559 Pain in unspecified hip: Secondary | ICD-10-CM | POA: Diagnosis not present

## 2018-10-11 DIAGNOSIS — G5613 Other lesions of median nerve, bilateral upper limbs: Secondary | ICD-10-CM | POA: Diagnosis not present

## 2018-10-11 DIAGNOSIS — G894 Chronic pain syndrome: Secondary | ICD-10-CM | POA: Diagnosis not present

## 2018-10-11 DIAGNOSIS — M961 Postlaminectomy syndrome, not elsewhere classified: Secondary | ICD-10-CM | POA: Diagnosis not present

## 2018-10-25 DIAGNOSIS — N390 Urinary tract infection, site not specified: Secondary | ICD-10-CM | POA: Diagnosis not present

## 2018-10-25 DIAGNOSIS — Z681 Body mass index (BMI) 19 or less, adult: Secondary | ICD-10-CM | POA: Diagnosis not present

## 2018-11-14 DIAGNOSIS — R3914 Feeling of incomplete bladder emptying: Secondary | ICD-10-CM | POA: Diagnosis not present

## 2018-11-14 DIAGNOSIS — N3020 Other chronic cystitis without hematuria: Secondary | ICD-10-CM | POA: Diagnosis not present

## 2018-11-15 ENCOUNTER — Other Ambulatory Visit: Payer: Self-pay | Admitting: Cardiovascular Disease

## 2018-12-08 ENCOUNTER — Telehealth: Payer: Self-pay | Admitting: Cardiovascular Disease

## 2018-12-08 DIAGNOSIS — G5613 Other lesions of median nerve, bilateral upper limbs: Secondary | ICD-10-CM | POA: Diagnosis not present

## 2018-12-08 DIAGNOSIS — G894 Chronic pain syndrome: Secondary | ICD-10-CM | POA: Diagnosis not present

## 2018-12-08 DIAGNOSIS — Z79899 Other long term (current) drug therapy: Secondary | ICD-10-CM | POA: Diagnosis not present

## 2018-12-08 DIAGNOSIS — M25559 Pain in unspecified hip: Secondary | ICD-10-CM | POA: Diagnosis not present

## 2018-12-08 DIAGNOSIS — Z79891 Long term (current) use of opiate analgesic: Secondary | ICD-10-CM | POA: Diagnosis not present

## 2018-12-08 DIAGNOSIS — M961 Postlaminectomy syndrome, not elsewhere classified: Secondary | ICD-10-CM | POA: Diagnosis not present

## 2018-12-08 NOTE — Telephone Encounter (Signed)
Virtual Visit Pre-Appointment Phone Call  "(Name), I am calling you today to discuss your upcoming appointment. We are currently trying to limit exposure to the virus that causes COVID-19 by seeing patients at home rather than in the office."  1. "What is the BEST phone number to call the day of the visit?" - include this in appointment notes  2. Do you have or have access to (through a family member/friend) a smartphone with video capability that we can use for your visit?" a. If yes - list this number in appt notes as cell (if different from BEST phone #) and list the appointment type as a VIDEO visit in appointment notes b. If no - list the appointment type as a PHONE visit in appointment notes  3. Confirm consent - "In the setting of the current Covid19 crisis, you are scheduled for a (phone or video) visit with your provider on (date) at (time).  Just as we do with many in-office visits, in order for you to participate in this visit, we must obtain consent.  If you'd like, I can send this to your mychart (if signed up) or email for you to review.  Otherwise, I can obtain your verbal consent now.  All virtual visits are billed to your insurance company just like a normal visit would be.  By agreeing to a virtual visit, we'd like you to understand that the technology does not allow for your provider to perform an examination, and thus may limit your provider's ability to fully assess your condition. If your provider identifies any concerns that need to be evaluated in person, we will make arrangements to do so.  Finally, though the technology is pretty good, we cannot assure that it will always work on either your or our end, and in the setting of a video visit, we may have to convert it to a phone-only visit.  In either situation, we cannot ensure that we have a secure connection.  Are you willing to proceed?" STAFF: Did the patient verbally acknowledge consent to telehealth visit? Document  YES/NO here: yes  4. Advise patient to be prepared - "Two hours prior to your appointment, go ahead and check your blood pressure, pulse, oxygen saturation, and your weight (if you have the equipment to check those) and write them all down. When your visit starts, your provider will ask you for this information. If you have an Apple Watch or Kardia device, please plan to have heart rate information ready on the day of your appointment. Please have a pen and paper handy nearby the day of the visit as well."  5. Give patient instructions for MyChart download to smartphone OR [email or web address] as below if video visit (depending on what platform provider is using)  6. Inform patient they will receive a phone call 15 minutes prior to their appointment time (may be from unknown caller ID) so they should be prepared to answer    TELEPHONE CALL NOTE  Mckenzie [name] has been deemed a candidate for a follow-up tele-health visit to limit community exposure during the Covid-19 pandemic. I spoke with the patient via phone to ensure availability of phone/video source, confirm preferred email & phone number, and discuss instructions and expectations.  I reminded Mckenzie [name] to be prepared with any vital sign and/or heart rhythm information that could potentially be obtained via home monitoring, at the time of her visit. I reminded Mckenzie [name] to expect a phone call prior to  her visit.  Mckenzie [name] 12/08/2018 1:26 PM   INSTRUCTIONS FOR DOWNLOADING THE MYCHART APP TO SMARTPHONE  - The patient must first make sure to have activated MyChart and know their login information - If Apple, go to [employer] and type in MyChart in the search bar and download the app. If Android, ask patient to go to [employer] and type in [place] in the search bar and download the app. The app is free but as with any other app downloads, their phone may require them to verify saved payment information or  Apple/Android password.  - The patient will need to then log into the app with their MyChart username and password, and select [facility] as their healthcare provider to link the account. When it is time for your visit, go to the MyChart app, find appointments, and click Begin Video Visit. Be sure to Select Allow for your device to access the Microphone and Camera for your visit. You will then be connected, and your provider will be with you shortly.  **If they have any issues connecting, or need assistance please contact MyChart service desk [phone])**  **If using a computer, in order to ensure the best quality for their visit they will need to use either of the following Internet Browsers: [employer], or Google Chrome**  IF USING DOXIMITY or DOXY.ME - The patient will receive a link just prior to their visit by text.     FULL LENGTH CONSENT FOR TELE-HEALTH VISIT   I hereby voluntarily request, consent and authorize [facility] and its employed or contracted physicians, physician assistants, nurse practitioners or other licensed health care professionals (the Practitioner), to provide me with telemedicine health care services (the Services") as deemed necessary by the treating Practitioner. I acknowledge and consent to receive the Services by the Practitioner via telemedicine. I understand that the telemedicine visit will involve communicating with the Practitioner through live audiovisual communication technology and the disclosure of certain medical information by electronic transmission. I acknowledge that I have been given the opportunity to request an in-person assessment or other available alternative prior to the telemedicine visit and am voluntarily participating in the telemedicine visit.  I understand that I have the right to withhold or withdraw my consent to the use of telemedicine in the course of my care at any time, without affecting my right to future care  or treatment, and that the Practitioner or I may terminate the telemedicine visit at any time. I understand that I have the right to inspect all information obtained and/or recorded in the course of the telemedicine visit and may receive copies of available information for a reasonable fee.  I understand that some of the potential risks of receiving the Services via telemedicine include:   Delay or interruption in medical evaluation due to technological equipment failure or disruption;  Information transmitted may not be sufficient (e.g. poor resolution of images) to allow for appropriate medical decision making by the Practitioner; and/or   In rare instances, security protocols could fail, causing a breach of personal health information.  Furthermore, I acknowledge that it is my responsibility to provide information about my medical history, conditions and care that is complete and accurate to the best of my ability. I acknowledge that Practitioner's advice, recommendations, and/or decision may be based on factors not within their control, such as incomplete or inaccurate data provided by me or distortions of diagnostic images or specimens that may result from electronic transmissions. I  understand that the practice of medicine is not an exact science and that Practitioner makes no warranties or guarantees regarding treatment outcomes. I acknowledge that I will receive a copy of this consent concurrently upon execution via email to the email address I last provided but may also request a printed copy by calling the office of [facility].    I understand that my insurance will be billed for this visit.   I have read or had this consent read to me.  I understand the contents of this consent, which adequately explains the benefits and risks of the Services being provided via telemedicine.   I have been provided ample opportunity to ask questions regarding this consent and the Services and have had  my questions answered to my satisfaction.  I give my informed consent for the services to be provided through the use of telemedicine in my medical care  By participating in this telemedicine visit I agree to the above.

## 2018-12-08 NOTE — Telephone Encounter (Signed)
Virtual Visit Pre-Appointment Phone Call  "(Name), I am calling you today to discuss your upcoming appointment. We are currently trying to limit exposure to the virus that causes COVID-19 by seeing patients at home rather than in the office."  1. "What is the BEST phone number to call the day of the visit?" - include this in appointment notes  2. Do you have or have access to (through a family member/friend) a smartphone with video capability that we can use for your visit?" a. If yes - list this number in appt notes as cell (if different from BEST phone #) and list the appointment type as a VIDEO visit in appointment notes b. If no - list the appointment type as a PHONE visit in appointment notes  3. Confirm consent - "In the setting of the current Covid19 crisis, you are scheduled for a (phone or video) visit with your provider on (date) at (time).  Just as we do with many in-office visits, in order for you to participate in this visit, we must obtain consent.  If you'd like, I can send this to your mychart (if signed up) or email for you to review.  Otherwise, I can obtain your verbal consent now.  All virtual visits are billed to your insurance company just like a normal visit would be.  By agreeing to a virtual visit, we'd like you to understand that the technology does not allow for your provider to perform an examination, and thus may limit your provider's ability to fully assess your condition. If your provider identifies any concerns that need to be evaluated in person, we will make arrangements to do so.  Finally, though the technology is pretty good, we cannot assure that it will always work on either your or our end, and in the setting of a video visit, we may have to convert it to a phone-only visit.  In either situation, we cannot ensure that we have a secure connection.  Are you willing to proceed?" STAFF: Did the patient verbally acknowledge consent to telehealth visit? Document  YES/NO here: yes  4. Advise patient to be prepared - "Two hours prior to your appointment, go ahead and check your blood pressure, pulse, oxygen saturation, and your weight (if you have the equipment to check those) and write them all down. When your visit starts, your provider will ask you for this information. If you have an Apple Watch or Kardia device, please plan to have heart rate information ready on the day of your appointment. Please have a pen and paper handy nearby the day of the visit as well."  5. Give patient instructions for MyChart download to smartphone OR [email or web address] as below if video visit (depending on what platform provider is using)  6. Inform patient they will receive a phone call 15 minutes prior to their appointment time (may be from unknown caller ID) so they should be prepared to answer    TELEPHONE CALL NOTE  Mckenzie [name] [place] has been deemed a candidate for a follow-up tele-health visit to limit community exposure during the Covid-19 pandemic. I spoke with the patient via phone to ensure availability of phone/video source, confirm preferred email & phone number, and discuss instructions and expectations.  I reminded Mckenzie [name] [place] to be prepared with any vital sign and/or heart rhythm information that could potentially be obtained via home monitoring, at the time of her visit. I reminded Mckenzie [name] [place] to expect a phone call prior to  her visit.  Mckenzie [name] 12/08/2018 4:04 PM   INSTRUCTIONS FOR DOWNLOADING THE MYCHART APP TO SMARTPHONE  - The patient must first make sure to have activated MyChart and know their login information - If Apple, go to [employer] and type in MyChart in the search bar and download the app. If Android, ask patient to go to [employer] and type in [place] in the search bar and download the app. The app is free but as with any other app downloads, their phone may require them to verify saved payment information or  [employer]/Android password.  - The patient will need to then log into the app with their MyChart username and password, and select [facility] as their healthcare provider to link the account. When it is time for your visit, go to the MyChart app, find appointments, and click Begin Video Visit. Be sure to Select Allow for your device to access the Microphone and Camera for your visit. You will then be connected, and your provider will be with you shortly.  **If they have any issues connecting, or need assistance please contact MyChart service desk [phone])**  **If using a computer, in order to ensure the best quality for their visit they will need to use either of the following Internet Browsers: [employer], or Google Chrome**  IF USING DOXIMITY or DOXY.ME - The patient will receive a link just prior to their visit by text.     FULL LENGTH CONSENT FOR TELE-HEALTH VISIT   I hereby voluntarily request, consent and authorize [facility] and its employed or contracted physicians, physician assistants, nurse practitioners or other licensed health care professionals (the Practitioner), to provide me with telemedicine health care services (the Services") as deemed necessary by the treating Practitioner. I acknowledge and consent to receive the Services by the Practitioner via telemedicine. I understand that the telemedicine visit will involve communicating with the Practitioner through live audiovisual communication technology and the disclosure of certain medical information by electronic transmission. I acknowledge that I have been given the opportunity to request an in-person assessment or other available alternative prior to the telemedicine visit and am voluntarily participating in the telemedicine visit.  I understand that I have the right to withhold or withdraw my consent to the use of telemedicine in the course of my care at any time, without affecting my right to future care  or treatment, and that the Practitioner or I may terminate the telemedicine visit at any time. I understand that I have the right to inspect all information obtained and/or recorded in the course of the telemedicine visit and may receive copies of available information for a reasonable fee.  I understand that some of the potential risks of receiving the Services via telemedicine include:   Delay or interruption in medical evaluation due to technological equipment failure or disruption;  Information transmitted may not be sufficient (e.g. poor resolution of images) to allow for appropriate medical decision making by the Practitioner; and/or   In rare instances, security protocols could fail, causing a breach of personal health information.  Furthermore, I acknowledge that it is my responsibility to provide information about my medical history, conditions and care that is complete and accurate to the best of my ability. I acknowledge that Practitioner's advice, recommendations, and/or decision may be based on factors not within their control, such as incomplete or inaccurate data provided by me or distortions of diagnostic images or specimens that may result from electronic transmissions. I  understand that the practice of medicine is not an exact science and that Practitioner makes no warranties or guarantees regarding treatment outcomes. I acknowledge that I will receive a copy of this consent concurrently upon execution via email to the email address I last provided but may also request a printed copy by calling the office of [facility].    I understand that my insurance will be billed for this visit.   I have read or had this consent read to me.  I understand the contents of this consent, which adequately explains the benefits and risks of the Services being provided via telemedicine.   I have been provided ample opportunity to ask questions regarding this consent and the Services and have had  my questions answered to my satisfaction.  I give my informed consent for the services to be provided through the use of telemedicine in my medical care  By participating in this telemedicine visit I agree to the above.

## 2018-12-22 ENCOUNTER — Encounter: Payer: Self-pay | Admitting: Cardiovascular Disease

## 2018-12-22 ENCOUNTER — Telehealth (INDEPENDENT_AMBULATORY_CARE_PROVIDER_SITE_OTHER): Payer: Medicare Other | Admitting: Cardiovascular Disease

## 2018-12-22 VITALS — BP 138/61

## 2018-12-22 DIAGNOSIS — I25118 Atherosclerotic heart disease of native coronary artery with other forms of angina pectoris: Secondary | ICD-10-CM | POA: Diagnosis not present

## 2018-12-22 DIAGNOSIS — Z955 Presence of coronary angioplasty implant and graft: Secondary | ICD-10-CM

## 2018-12-22 DIAGNOSIS — E785 Hyperlipidemia, unspecified: Secondary | ICD-10-CM

## 2018-12-22 DIAGNOSIS — R600 Localized edema: Secondary | ICD-10-CM

## 2018-12-22 DIAGNOSIS — R002 Palpitations: Secondary | ICD-10-CM

## 2018-12-22 DIAGNOSIS — I350 Nonrheumatic aortic (valve) stenosis: Secondary | ICD-10-CM

## 2018-12-22 DIAGNOSIS — I6523 Occlusion and stenosis of bilateral carotid arteries: Secondary | ICD-10-CM

## 2018-12-22 NOTE — Progress Notes (Signed)
Virtual Visit via Telephone Note   This visit type was conducted due to national recommendations for restrictions regarding the COVID-19 Pandemic (e.g. social distancing) in an effort to limit this patient's exposure and mitigate transmission in our community.  Due to her co-morbid illnesses, this patient is at least at moderate risk for complications without adequate follow up.  This format is felt to be most appropriate for this patient at this time.  The patient did not have access to video technology/had technical difficulties with video requiring transitioning to audio format only (telephone).  All issues noted in this document were discussed and addressed.  No physical exam could be performed with this format.  Please refer to the patient's chart for her  consent to telehealth for [facility].   Date:  12/22/2018   ID:  Monna Fam, DOB 1936-03-13, MRN [id number]  Patient Location: Home Provider Location: Office  PCP:  Rosalee Kaufman, PA-C  Cardiologist:  Kate Sable, MD  Electrophysiologist:  None   Evaluation Performed:  Follow-Up Visit  Chief Complaint:  CAD  History of Present Illness:    Mckenzie [name] is a 83 y.o. female with coronary artery disease. She had an LAD stent placed in 2003 with coronary angiography performed in 2012 which showed patency of the stent.   The patient denies any symptoms of chest pain, palpitations, shortness of breath, lightheadedness, dizziness, leg swelling, orthopnea, PND, and syncope.  She has been doing housework today.  The patient does not have symptoms concerning for COVID-19 infection (fever, chills, cough, or new shortness of breath).    Past Medical History:  Diagnosis Date  . Aortic valve sclerosis    Mild, echo, April, 2012  . ARDS (adult respiratory distress syndrome) ([place]) 2003   with tracheostomy-peritonitis  . Arthritis   . Back pain    Need for surgery, Dr. Glenna Fellows  . CAD (coronary artery  disease)    Stent to LAD, 2003 [facility] / nuclear scan April, 201 to artifact / persistent shortness of breath / catheterization Oct 21, 2010... widely patent stent to the mid LAD, no significant obstructive disease, should be low risk    for back surgery  . Carotid arterial disease ([place])    Moderate-Dr. Trula Slade following  . Chronic back pain   . COPD (chronic obstructive pulmonary disease) ([place])   . Diastolic dysfunction    Echo, April, 2011   EF 65%  . Dysrhythmia    afib  . Ejection fraction    EF 65%, echo, September 15, 2010  . GERD (gastroesophageal reflux disease)   . Hemolytic anemia (HCC)    Followed by Dr. Janae Sauce  . Hypertension   . Hypothyroidism   . Left ventricular hypertrophy    EF 65%, echo, April, 2011  . MR (mitral regurgitation)     Mild echo, 2012 ///  Mild per Echo 2009,  mild, echo, April, 2011  . Myocardial infarction ([place])   . Palpitations    Holter, September 16, 2010, PACs and PVCs,one 4 beat run of SVT  . Pneumonia 11-2011  . Preoperative clearance    Preop clearance for back surgery  . Shortness of breath    March, 2012  . UTI (urinary tract infection)    Chronic-on Macrodantin   Past Surgical History:  Procedure Laterality Date  . APPENDECTOMY    . BACK SURGERY    . BREAST LUMPECTOMY Left   . CATARACT EXTRACTION W/PHACO Right 12/05/2012   Procedure: CATARACT EXTRACTION  PHACO AND INTRAOCULAR LENS PLACEMENT (IOC);  Surgeon: Gemma PayorKerry Hunt, MD;  Location: [facility];  Service: Ophthalmology;  Laterality: Right;  CDE:15.30  . CATARACT EXTRACTION W/PHACO Left 12/15/2012   Procedure: CATARACT EXTRACTION PHACO AND INTRAOCULAR LENS PLACEMENT (IOC);  Surgeon: Gemma PayorKerry Hunt, MD;  Location: [facility];  Service: Ophthalmology;  Laterality: Left;  CDE:22.56  . CORONARY ANGIOPLASTY WITH STENT PLACEMENT  2003  . repair of ruptured lumbar discs  12-2011  . SPINE SURGERY     lumbar [facility]im[facility]Toole [facility]  . THYROID SURGERY  08/2009   Benign adenomas  .  TONSILLECTOMY    . TRACHEOSTOMY  2003   Following ARDS      Current Meds  Medication Sig  . aspirin 81 MG tablet Take 81 mg by mouth daily.  Marland Kitchen. atorvastatin (LIPITOR) 10 MG tablet Take 10 mg by mouth daily.   . benazepril (LOTENSIN) 5 MG tablet Take 5 mg by mouth daily.   . Calcium Carbonate (CALCIUM 600 PO) Take 1,200 mg by mouth daily.  . Cholecalciferol (VITAMIN D3) 1000 UNITS CAPS Take 1,000 Units by mouth daily. Take one tab daily  . diphenhydrAMINE (BENADRYL) 25 mg capsule Take 25 mg by mouth daily as needed for sleep.   Marland Kitchen. esomeprazole (NEXIUM) 40 MG capsule Take 40 mg by mouth daily before breakfast.   . Ferrous Gluconate (IRON) 240 (27 FE) MG TABS Take 1 tablet by mouth daily.  . folic acid (FOLVITE) 800 MCG tablet Take 800 mcg by mouth daily.  . hyoscyamine (LEVSIN SL) 0.125 MG SL tablet Take 0.125 mg by mouth every 6 (six) hours as needed for cramping or diarrhea or loose stools.   Marland Kitchen. HYSINGLA ER 60 MG T24A Take 1 tablet by mouth 2 (two) times a day.   . Ipratropium-Albuterol (COMBIVENT RESPIMAT) 20-100 MCG/ACT AERS respimat Inhale 1 puff into the lungs every 6 (six) hours as needed for wheezing or shortness of breath.  . levothyroxine (SYNTHROID, LEVOTHROID) 75 MCG tablet Take 75 mcg by mouth daily before breakfast.   . Melatonin 10 MG TABS Take 1 tablet by mouth at bedtime.  . metoprolol tartrate (LOPRESSOR) 25 MG tablet TAKE (1) TABLET TWICE DAILY.  . Multiple Vitamin (MULTIVITAMIN) tablet Take 1 tablet by mouth daily.   . nitroGLYCERIN (NITROSTAT) 0.4 MG SL tablet Place one tablet under tongue every 5 minutes up to 3 doses as needed for chest pain. No more than 3 doses over a 15 minute period.  . polyethylene glycol (MIRALAX / GLYCOLAX) 17 g packet Take 17 g by mouth every morning.  . potassium chloride (K-DUR) 10 MEQ tablet TAKE 2 TABLETS BY MOUTH ONCE DAILY.  Marland Kitchen. temazepam (RESTORIL) 30 MG capsule Take 30 mg by mouth at bedtime.   . tretinoin (RETIN-A) 0.05 % cream Apply 1  application topically at bedtime.      Allergies:   Tetracycline   Social History   Tobacco Use  . Smoking status: Former Smoker    Packs/day: 1.00    Years: 20.00    Pack years: 20.00    Types: Cigarettes    Quit date: 06/15/2000    Years since quitting: 18.5  . Smokeless tobacco: Never Used  Substance Use Topics  . Alcohol use: No  . Drug use: No     Family Hx: The patient's family history includes COPD in an other family member.  ROS:   Please see the history of present illness.     All other systems  reviewed and are negative.   Prior CV studies:   The following studies were reviewed today:  Carotid artery Dopplers on 04/05/17 showed less than 40% stenosis bilaterally.  Echocardiogram 04/20/18:  - Left ventricle: The cavity size was normal. Wall thickness was   normal. Systolic function was normal. The estimated ejection   fraction was in the range of 60% to 65%. Wall motion was normal;   there were no regional wall motion abnormalities. - Aortic valve: Mildly calcified annulus. Trileaflet; mildly   thickened leaflets. There was mild stenosis. Mean gradient (S):   11 mm Hg. Valve area (VTI): 1.78 cm^2. Valve area (Vmax): 1.63   cm^2. Valve area (Vmean): 1.77 cm^2. - Mitral valve: Mildly calcified annulus. Mildly thickened leaflets   . There was mild regurgitation. - Pulmonary arteries: Systolic pressure was mildly increased. PA   peak pressure: 38 mm Hg (S).  Labs/Other Tests and Data Reviewed:    EKG:  No ECG reviewed.  Recent Labs: 05/06/2018: ALT 17 05/09/2018: BUN 12; Creatinine, Ser 0.94; Hemoglobin 9.8; Platelets 118; Potassium 3.4; Sodium 142   Recent Lipid Panel No results found for: CHOL, TRIG, HDL, CHOLHDL, LDLCALC, LDLDIRECT  Wt Readings from Last 3 Encounters:  05/09/18 119 lb 7.8 oz (54.2 kg)  03/22/18 118 lb (53.5 kg)  04/05/17 120 lb 9.6 oz (54.7 kg)     Objective:    Vital Signs:  BP 138/61    VITAL SIGNS:  reviewed  ASSESSMENT  & PLAN:    1. CAD with LAD stent: Symptomatically stable.  Continue ASA, atorvastatin, and metoprolol.  2. Carotid artery disease: Dopplers reviewed above. Continue ASA and statin.  3. Hyperlipidemia: Continue statin.  4. Palpitations/PSVT: Symptomatically stable. Continue metoprolol.  5.  Aortic stenosis: Mild in severity by echocardiogram in November 2019.  I will monitor.  6. Bilateral leg edema: This is likely due to venous insufficiency. I recommended compression stockings but she has a difficult time using them. She is no longer on Lasix but denies any current symptoms.  7. HTN: BP is normal. No changes.   COVID-19 Education: The signs and symptoms of COVID-19 were discussed with the patient and how to seek care for testing (follow up with PCP or arrange E-visit).  The importance of social distancing was discussed today.  Time:   Today, I have spent 10 minutes with the patient with telehealth technology discussing the above problems.     Medication Adjustments/Labs and Tests Ordered: Current medicines are reviewed at length with the patient today.  Concerns regarding medicines are outlined above.   Tests Ordered: No orders of the defined types were placed in this encounter.   Medication Changes: No orders of the defined types were placed in this encounter.   Follow Up:  Virtual Visit or In Person in 6 month(s)  Signed, Prentice DockerSuresh Koneswaran, MD  12/22/2018 3:01 PM    [facility]

## 2018-12-22 NOTE — Patient Instructions (Signed)

## 2019-01-05 DIAGNOSIS — G5613 Other lesions of median nerve, bilateral upper limbs: Secondary | ICD-10-CM | POA: Diagnosis not present

## 2019-01-05 DIAGNOSIS — M961 Postlaminectomy syndrome, not elsewhere classified: Secondary | ICD-10-CM | POA: Diagnosis not present

## 2019-01-05 DIAGNOSIS — M79643 Pain in unspecified hand: Secondary | ICD-10-CM | POA: Diagnosis not present

## 2019-01-05 DIAGNOSIS — G894 Chronic pain syndrome: Secondary | ICD-10-CM | POA: Diagnosis not present

## 2019-01-12 DIAGNOSIS — Z0001 Encounter for general adult medical examination with abnormal findings: Secondary | ICD-10-CM | POA: Diagnosis not present

## 2019-01-12 DIAGNOSIS — J449 Chronic obstructive pulmonary disease, unspecified: Secondary | ICD-10-CM | POA: Diagnosis not present

## 2019-01-12 DIAGNOSIS — I10 Essential (primary) hypertension: Secondary | ICD-10-CM | POA: Diagnosis not present

## 2019-01-12 DIAGNOSIS — I358 Other nonrheumatic aortic valve disorders: Secondary | ICD-10-CM | POA: Diagnosis not present

## 2019-01-12 DIAGNOSIS — Z681 Body mass index (BMI) 19 or less, adult: Secondary | ICD-10-CM | POA: Diagnosis not present

## 2019-01-12 DIAGNOSIS — E039 Hypothyroidism, unspecified: Secondary | ICD-10-CM | POA: Diagnosis not present

## 2019-01-12 DIAGNOSIS — E782 Mixed hyperlipidemia: Secondary | ICD-10-CM | POA: Diagnosis not present

## 2019-01-12 DIAGNOSIS — R609 Edema, unspecified: Secondary | ICD-10-CM | POA: Diagnosis not present

## 2019-02-02 ENCOUNTER — Other Ambulatory Visit: Payer: Self-pay

## 2019-02-02 DIAGNOSIS — G5613 Other lesions of median nerve, bilateral upper limbs: Secondary | ICD-10-CM | POA: Diagnosis not present

## 2019-02-02 DIAGNOSIS — Z20822 Contact with and (suspected) exposure to covid-19: Secondary | ICD-10-CM

## 2019-02-02 DIAGNOSIS — M961 Postlaminectomy syndrome, not elsewhere classified: Secondary | ICD-10-CM | POA: Diagnosis not present

## 2019-02-02 DIAGNOSIS — G894 Chronic pain syndrome: Secondary | ICD-10-CM | POA: Diagnosis not present

## 2019-02-02 DIAGNOSIS — M25559 Pain in unspecified hip: Secondary | ICD-10-CM | POA: Diagnosis not present

## 2019-02-04 LAB — NOVEL CORONAVIRUS, NAA: SARS-CoV-2, NAA: NOT DETECTED

## 2019-02-13 DIAGNOSIS — M62838 Other muscle spasm: Secondary | ICD-10-CM | POA: Diagnosis not present

## 2019-02-13 DIAGNOSIS — R3914 Feeling of incomplete bladder emptying: Secondary | ICD-10-CM | POA: Diagnosis not present

## 2019-02-13 DIAGNOSIS — M6281 Muscle weakness (generalized): Secondary | ICD-10-CM | POA: Diagnosis not present

## 2019-02-13 DIAGNOSIS — N816 Rectocele: Secondary | ICD-10-CM | POA: Diagnosis not present

## 2019-02-16 DIAGNOSIS — Z681 Body mass index (BMI) 19 or less, adult: Secondary | ICD-10-CM | POA: Diagnosis not present

## 2019-02-16 DIAGNOSIS — K5903 Drug induced constipation: Secondary | ICD-10-CM | POA: Diagnosis not present

## 2019-03-02 DIAGNOSIS — M25559 Pain in unspecified hip: Secondary | ICD-10-CM | POA: Diagnosis not present

## 2019-03-02 DIAGNOSIS — Z79899 Other long term (current) drug therapy: Secondary | ICD-10-CM | POA: Diagnosis not present

## 2019-03-02 DIAGNOSIS — G894 Chronic pain syndrome: Secondary | ICD-10-CM | POA: Diagnosis not present

## 2019-03-02 DIAGNOSIS — M79605 Pain in left leg: Secondary | ICD-10-CM | POA: Diagnosis not present

## 2019-03-02 DIAGNOSIS — Z79891 Long term (current) use of opiate analgesic: Secondary | ICD-10-CM | POA: Diagnosis not present

## 2019-03-02 DIAGNOSIS — M961 Postlaminectomy syndrome, not elsewhere classified: Secondary | ICD-10-CM | POA: Diagnosis not present

## 2019-03-06 DIAGNOSIS — K5903 Drug induced constipation: Secondary | ICD-10-CM | POA: Diagnosis not present

## 2019-03-06 DIAGNOSIS — Z681 Body mass index (BMI) 19 or less, adult: Secondary | ICD-10-CM | POA: Diagnosis not present

## 2019-03-06 DIAGNOSIS — K6289 Other specified diseases of anus and rectum: Secondary | ICD-10-CM | POA: Diagnosis not present

## 2019-03-27 DIAGNOSIS — Z9689 Presence of other specified functional implants: Secondary | ICD-10-CM | POA: Diagnosis not present

## 2019-03-27 DIAGNOSIS — M47812 Spondylosis without myelopathy or radiculopathy, cervical region: Secondary | ICD-10-CM | POA: Diagnosis not present

## 2019-03-27 DIAGNOSIS — G894 Chronic pain syndrome: Secondary | ICD-10-CM | POA: Diagnosis not present

## 2019-03-27 DIAGNOSIS — M1612 Unilateral primary osteoarthritis, left hip: Secondary | ICD-10-CM | POA: Diagnosis not present

## 2019-03-27 DIAGNOSIS — M961 Postlaminectomy syndrome, not elsewhere classified: Secondary | ICD-10-CM | POA: Diagnosis not present

## 2019-03-27 DIAGNOSIS — M5030 Other cervical disc degeneration, unspecified cervical region: Secondary | ICD-10-CM | POA: Diagnosis not present

## 2019-03-27 DIAGNOSIS — K5903 Drug induced constipation: Secondary | ICD-10-CM | POA: Diagnosis not present

## 2019-03-27 DIAGNOSIS — R200 Anesthesia of skin: Secondary | ICD-10-CM | POA: Diagnosis not present

## 2019-04-14 DIAGNOSIS — E782 Mixed hyperlipidemia: Secondary | ICD-10-CM | POA: Diagnosis not present

## 2019-04-14 DIAGNOSIS — I10 Essential (primary) hypertension: Secondary | ICD-10-CM | POA: Diagnosis not present

## 2019-04-24 DIAGNOSIS — M961 Postlaminectomy syndrome, not elsewhere classified: Secondary | ICD-10-CM | POA: Diagnosis not present

## 2019-04-24 DIAGNOSIS — G894 Chronic pain syndrome: Secondary | ICD-10-CM | POA: Diagnosis not present

## 2019-04-24 DIAGNOSIS — M1612 Unilateral primary osteoarthritis, left hip: Secondary | ICD-10-CM | POA: Diagnosis not present

## 2019-04-24 DIAGNOSIS — M79605 Pain in left leg: Secondary | ICD-10-CM | POA: Diagnosis not present

## 2019-05-03 ENCOUNTER — Encounter: Payer: Self-pay | Admitting: Gastroenterology

## 2019-05-15 DIAGNOSIS — I10 Essential (primary) hypertension: Secondary | ICD-10-CM | POA: Diagnosis not present

## 2019-05-15 DIAGNOSIS — E039 Hypothyroidism, unspecified: Secondary | ICD-10-CM | POA: Diagnosis not present

## 2019-05-16 DIAGNOSIS — K6289 Other specified diseases of anus and rectum: Secondary | ICD-10-CM | POA: Diagnosis not present

## 2019-05-16 DIAGNOSIS — R21 Rash and other nonspecific skin eruption: Secondary | ICD-10-CM | POA: Diagnosis not present

## 2019-05-16 DIAGNOSIS — Z681 Body mass index (BMI) 19 or less, adult: Secondary | ICD-10-CM | POA: Diagnosis not present

## 2019-05-23 DIAGNOSIS — M961 Postlaminectomy syndrome, not elsewhere classified: Secondary | ICD-10-CM | POA: Diagnosis not present

## 2019-05-23 DIAGNOSIS — G5613 Other lesions of median nerve, bilateral upper limbs: Secondary | ICD-10-CM | POA: Diagnosis not present

## 2019-05-23 DIAGNOSIS — G894 Chronic pain syndrome: Secondary | ICD-10-CM | POA: Diagnosis not present

## 2019-05-23 DIAGNOSIS — M25559 Pain in unspecified hip: Secondary | ICD-10-CM | POA: Diagnosis not present

## 2019-05-23 DIAGNOSIS — Z79899 Other long term (current) drug therapy: Secondary | ICD-10-CM | POA: Diagnosis not present

## 2019-05-23 DIAGNOSIS — Z79891 Long term (current) use of opiate analgesic: Secondary | ICD-10-CM | POA: Diagnosis not present

## 2019-06-12 ENCOUNTER — Ambulatory Visit (INDEPENDENT_AMBULATORY_CARE_PROVIDER_SITE_OTHER): Payer: Medicare Other | Admitting: Gastroenterology

## 2019-06-12 ENCOUNTER — Encounter: Payer: Self-pay | Admitting: Emergency Medicine

## 2019-06-12 VITALS — BP 126/50 | HR 88 | Temp 97.4°F | Ht 60.5 in | Wt 101.4 lb

## 2019-06-12 DIAGNOSIS — K5641 Fecal impaction: Secondary | ICD-10-CM

## 2019-06-12 DIAGNOSIS — K6289 Other specified diseases of anus and rectum: Secondary | ICD-10-CM

## 2019-06-12 NOTE — Patient Instructions (Addendum)
Fecal impaction is a severe form of constipation. It means that you have a large amount of hard stool in your rectum that you can't pass.   Clear liquid diet for the rest of the day  Clenpiq - one bottle today and one bottle tomorrow  Call in 24-48 hours with a symptom update  Continue to use your Miralax every day.  Add Metamucil one tablespoon daily. You can take this at the same time as your Miralax.   You may also need to take a stool softener while using prescription pain medications.   Please call me if you don't respond to the Clenpiq medication or if you develop abdominal pain, vomiting, abdominal swelling, or bleeding.   I value your feedback and thank you for entrusting [place] with your care. If you get a [place] patient survey, I would appreciate you taking the time to let us know about your experience today. Thank you!   Due to recent changes in healthcare laws, you may see the results of your imaging and laboratory studies on MyChart before your provider has had a chance to review them.  We understand that in some cases there may be results that are confusing or concerning to you. Not all laboratory results come back in the same time frame and the provider may be waiting for multiple results in order to interpret others.  Please give [place] 48 hours in order for your provider to thoroughly review all the results before contacting the office for clarification of your results.

## 2019-06-12 NOTE — Progress Notes (Signed)
Referring Provider: Royann ShiversSkillman, Katherine [name], * Primary Care Physician:  Mckenzie StackSkillman, Katherine E, PA-C  Reason for Consultation:  Rectal pain   IMPRESSION:  Fecal impaction with overflow diarrhea Rectal pain related to fecal impaction Normal [facility] per patient report No known family history of colon cancer or polyps  Cause of fecal impaction may be related to opiate. Bowel prep recommended to treat fecal impaction. Needs long term management plan while on opiates.  Drinking plenty of fluids and eating a high-fiber diet is recommended. Continue daily MiraLAX 17 g and add daily Metamucil. Consider endoscopic evaluation after resolution of fecal impaction.  However, the patient would like to avoid endoscopy if possible.  PLAN: Clear liquid diet for the rest of the day Drink plenty of water, follow a high-fiber diet Clenpiq - one bottle today and one bottle tomorrow (samples provided) Call in 24-48 hours with a symptom update or earlier with any alarm features After improvement in impaction, resume Miralax 17 g daily and add Metamucil QD  Obtain records from [facility]oscopy [facility] 10 years ago  Please see the "Patient Instructions" section for addition details about the plan.  HPI: Mckenzie [name] is a 83 y.o. female [facility]. The history is obtained to the patient, review of her electronic health record, and review of referral records. She has back pain with tens implanted and chronic opiate use, coronary artery disease with LAD stent placed in 2003, diastolic dysfunction with preserved EF, COPD, hypothyroidism. The patient thinks she has IBS.  Has progressive constipation over the last few months that she attributes to increasing use of opiates. Hard painful large stools. Intermittent straining with sense of incomplete evacuation.  Frequent diarrhea.  No fecal incontinence.  Denies urinary leakage.  Under  evaluation by urology for urinary incontinence. No fecal incontinence.    Last had a BM 3 days.  Baseline is one formed BM daily. No blood or mucous.  Uses Miralax daily.   Trial of lidocaine 2% mucosal jelly in September after a normal rectal exam was performed by the referring provider.  Scre[facility] at least 10 years ago.  The patient remembers this being normal.  No known family history of colon cancer or polyps. No family history of uterine/endometrial cancer, pancreatic cancer or gastric/stomach cancer.   Past Medical History:  Diagnosis Date  . Aortic valve sclerosis    Mild, echo, April, 2012  . ARDS (adult respiratory distress syndrome) (HCC) 2003   with tracheostomy-peritonitis  . Arthritis   . Back pain    Need for surgery, Dr. Trey SailorsMark Roy  . CAD (coronary artery disease)    [facility], [facility] / nuclear scan April, 201 to artifact / persistent shortness of breath / catheterization Oct 21, 2010... widely patent stent to the mid LAD, no significant obstructive disease, should be low risk    for back surgery  . Carotid arterial disease (HCC)    Moderate-Dr. Myra GianottiBrabham following  . Chronic back pain   . COPD (chronic obstructive pulmonary disease) (HCC)   . Diastolic dysfunction    Echo, April, 2011   EF 65%  . Dysrhythmia    afib  . Ejection fraction    EF 65%, echo, September 15, 2010  . GERD (gastroesophageal reflux disease)   . Hemolytic anemia (HCC)    Followed by Dr. Derald Macleodarvosky  . Hypertension   . Hypothyroidism   .  Left ventricular hypertrophy    EF 65%, echo, April, 2011  . MR (mitral regurgitation)     Mild echo, 2012 ///  Mild per Echo 2009,  mild, echo, April, 2011  . Myocardial infarction ([place])   . Palpitations    Holter, September 16, 2010, PACs and PVCs,one 4 beat run of SVT  . Pneumonia 11-2011  . Preoperative clearance    Preop clearance for back surgery  . Shortness of breath    March, 2012  . UTI (urinary tract  infection)    Chronic-on Macrodantin    Past Surgical History:  Procedure Laterality Date  . APPENDECTOMY    . BACK SURGERY    . BREAST LUMPECTOMY Left   . CATARACT EXTRACTION W/PHACO Right 12/05/2012   Procedure: CATARACT EXTRACTION PHACO AND INTRAOCULAR LENS PLACEMENT (IOC);  Surgeon: Tonny Branch, MD;  Location: [facility];  Service: Ophthalmology;  Laterality: Right;  CDE:15.30  . CATARACT EXTRACTION W/PHACO Left 12/15/2012   Procedure: CATARACT EXTRACTION PHACO AND INTRAOCULAR LENS PLACEMENT (IOC);  Surgeon: Tonny Branch, MD;  Location: [facility];  Service: Ophthalmology;  Laterality: Left;  CDE:22.56  . CORONARY ANGIOPLASTY WITH STENT PLACEMENT  2003  . repair of ruptured lumbar discs  12-2011  . SPINE SURGERY     lumbar disk removal by [facility]. Francesco Runner at [facility]  . THYROID SURGERY  08/2009   Benign adenomas  . TONSILLECTOMY    . TRACHEOSTOMY  2003   Following ARDS     Current Outpatient Medications  Medication Sig Dispense Refill  . aspirin 81 MG tablet Take 81 mg by mouth daily.    Marland Kitchen atorvastatin (LIPITOR) 10 MG tablet Take 10 mg by mouth daily.     . benazepril (LOTENSIN) 5 MG tablet Take 5 mg by mouth daily.     . Calcium Carbonate (CALCIUM 600 PO) Take 1,200 mg by mouth daily.    . Cholecalciferol (VITAMIN D3) 1000 UNITS CAPS Take 1,000 Units by mouth daily. Take one tab daily    . diphenhydrAMINE (BENADRYL) 25 mg capsule Take 25 mg by mouth daily as needed for sleep.     Marland Kitchen esomeprazole (NEXIUM) 40 MG capsule Take 40 mg by mouth daily before breakfast.     . Ferrous Gluconate (IRON) 240 (27 FE) MG TABS Take 1 tablet by mouth daily.    . folic acid (FOLVITE) 517 MCG tablet Take 800 mcg by mouth daily.    . hyoscyamine (LEVSIN SL) 0.125 MG SL tablet Take 0.125 mg by mouth every 6 (six) hours as needed for cramping or diarrhea or loose stools.     Marland Kitchen HYSINGLA ER 60 MG T24A Take 1 tablet by mouth 2 (two) times a day.     . Ipratropium-Albuterol (COMBIVENT RESPIMAT) 20-100 MCG/ACT  AERS respimat Inhale 1 puff into the lungs every 6 (six) hours as needed for wheezing or shortness of breath. 1 Inhaler 2  . levothyroxine (SYNTHROID, LEVOTHROID) 75 MCG tablet Take 75 mcg by mouth daily before breakfast.     . Melatonin 10 MG TABS Take 1 tablet by mouth at bedtime.    . metoprolol tartrate (LOPRESSOR) 25 MG tablet TAKE (1) TABLET TWICE DAILY. 60 tablet 0  . Multiple Vitamin (MULTIVITAMIN) tablet Take 1 tablet by mouth daily.     . nitroGLYCERIN (NITROSTAT) 0.4 MG SL tablet Place one tablet under tongue every 5 minutes up to 3 doses as needed for chest pain. No more than 3 doses over a 15 minute period. 100 tablet  0  . polyethylene glycol (MIRALAX / GLYCOLAX) 17 g packet Take 17 g by mouth every morning.    . potassium chloride (K-DUR) 10 MEQ tablet TAKE 2 TABLETS BY MOUTH ONCE DAILY. 60 tablet 3  . temazepam (RESTORIL) 30 MG capsule Take 30 mg by mouth at bedtime.     . tretinoin (RETIN-A) 0.05 % cream Apply 1 application topically at bedtime.      No current facility-administered medications for this visit.    Allergies as of 06/12/2019 - Review Complete 12/22/2018  Allergen Reaction Noted  . Tetracycline Nausea Only     Family History  Problem Relation Age of Onset  . COPD Other     Social History   Socioeconomic History  . Marital status: Married    Spouse name: Not on file  . Number of children: Not on file  . Years of education: Not on file  . Highest education level: Not on file  Occupational History  . Occupation: Retired  Tobacco Use  . Smoking status: Former Smoker    Packs/day: 1.00    Years: 20.00    Pack years: 20.00    Types: Cigarettes    Quit date: 06/15/2000    Years since quitting: 19.0  . Smokeless tobacco: Never Used  Substance and Sexual Activity  . Alcohol use: No  . Drug use: No  . Sexual activity: Not on file  Other Topics Concern  . Not on file  Social History Narrative   Regular Exercise: Yes-Walk   Social Determinants of  Health   Financial Resource Strain:   . Difficulty of Paying Living Expenses: Not on file  Food Insecurity:   . Worried About Programme researcher, broadcasting/film/video in the Last Year: Not on file  . Ran Out of Food in the Last Year: Not on file  Transportation Needs:   . Lack of Transportation (Medical): Not on file  . Lack of Transportation (Non-Medical): Not on file  Physical Activity:   . Days of Exercise per Week: Not on file  . Minutes of Exercise per Session: Not on file  Stress:   . Feeling of Stress : Not on file  Social Connections:   . Frequency of Communication with Friends and Family: Not on file  . Frequency of Social Gatherings with Friends and Family: Not on file  . Attends Religious Services: Not on file  . Active Member of Clubs or Organizations: Not on file  . Attends Banker Meetings: Not on file  . Marital Status: Not on file  Intimate Partner Violence:   . Fear of Current or Ex-Partner: Not on file  . Emotionally Abused: Not on file  . Physically Abused: Not on file  . Sexually Abused: Not on file    Review of Systems: 12 system ROS is negative except as noted above with the addition of arthritis, hearing problems, nose bleeding, insomnia, urinary urgency and incontience. Marland Kitchen   Physical Exam: General:   Alert,  well-nourished, pleasant and cooperative in NAD Head:  Normocephalic and atraumatic. Eyes:  Sclera clear, no icterus.   Conjunctiva pink. Ears:  Normal auditory acuity. Nose:  No deformity, discharge,  or lesions. Mouth:  No deformity or lesions.   Neck:  Supple; no masses or thyromegaly. Lungs:  Clear throughout to auscultation.   No wheezes. Heart:  Regular rate and rhythm; no murmurs. Abdomen:  Soft, thin, nontender, nondistended, normal bowel sounds, no rebound or guarding. No hepatosplenomegaly.   Rectal:   No  chemical dermatitis. No external hemorrhoids, fissure or fistula. Small stool particles at the anus. No prolapsing hemorrhoids. No rectal  prolapse. Normal anocutaneous reflex. The rectal vault is full of formed stool.  The stool is not hard.  No mass or fecal impaction. Normal anal resting tone.  Msk:  Symmetrical. No boney deformities LAD: No inguinal or umbilical LAD Extremities:  No clubbing or edema. Neurologic:  Alert and  oriented x4;  grossly nonfocal Skin:  Intact without significant lesions or rashes. Psych:  Alert and cooperative. Normal mood and affect.     Adrinne Sze L. Orvan Falconer, MD, MPH 06/12/2019, 11:21 AM

## 2019-07-03 ENCOUNTER — Telehealth: Payer: Self-pay | Admitting: Gastroenterology

## 2019-07-03 NOTE — Telephone Encounter (Signed)
FYI- Spoke to the patient who reported she took Clenpiq prep along with Miralax after her visit on 06/12/19. She reports shortly there after she developed diarrhea so she stopped the Miralax. She reports about a week or so after d/c'ing Miralax, she developed constipation so she resumed the miralax which induced more diarrhea. She reports she was not taking Metamucil so this RN told her to take the Metamucil in addition to the Miralax but if she continued to have diarrhea to reduce the dose by half and call back in a week with an update.   She also reported the RectiCare did not help and wanted to know alternatives. Please advise.

## 2019-07-03 NOTE — Telephone Encounter (Signed)
Pt reported that RectiCare is not working for her.  She would like a call to discuss.

## 2019-07-04 NOTE — Telephone Encounter (Signed)
My apologizes for the delay. I was out on vacation yesterday.   I am hopeful that daily Metamucil may provide improvement without constipation of diarrhea. She could then supplement with the Miralax as needed if she has further constipation.   What are the predominant symptoms bothering her at this time?  Thanks.

## 2019-07-04 NOTE — Telephone Encounter (Signed)
She could try some Anusol HC suppositories. Thank you.

## 2019-07-04 NOTE — Telephone Encounter (Signed)
When I spoke with the patient yesterday, her main problems were the diarrhea from the miralax (she had not started Metamucil) and the recticare cream did not help her rectal pain.

## 2019-07-05 ENCOUNTER — Other Ambulatory Visit: Payer: Self-pay | Admitting: *Deleted

## 2019-07-05 MED ORDER — HYDROCORTISONE ACETATE 25 MG RE SUPP: 25.0000 mg | Freq: Two times a day (BID) | RECTAL | 0 refills | Status: AC

## 2019-07-05 NOTE — Telephone Encounter (Signed)
Please advise admin instructions and duration for anusol suppositories? Any refills?

## 2019-07-05 NOTE — Telephone Encounter (Signed)
Patient aware. Anusol script sent to pharmacy on file.

## 2019-07-05 NOTE — Telephone Encounter (Signed)
Anusol HC 25mg  PR BID. Should not be used for more than 7 days. Thank you.

## 2019-07-10 DIAGNOSIS — Z681 Body mass index (BMI) 19 or less, adult: Secondary | ICD-10-CM | POA: Diagnosis not present

## 2019-07-10 DIAGNOSIS — R300 Dysuria: Secondary | ICD-10-CM | POA: Diagnosis not present

## 2019-07-10 DIAGNOSIS — N3001 Acute cystitis with hematuria: Secondary | ICD-10-CM | POA: Diagnosis not present

## 2019-07-13 ENCOUNTER — Telehealth: Payer: Self-pay | Admitting: Gastroenterology

## 2019-07-13 NOTE — Telephone Encounter (Signed)
The patient does not currently want to be scheduled for a flex sig. She said she would think about it.

## 2019-07-13 NOTE — Telephone Encounter (Signed)
I am sorry that she continues to have difficulty. Given her ongoing symptoms, I recommend a flexible sigmoidoscopy. Please schedule with me at the [facility] for next week. Thanks.

## 2019-07-13 NOTE — Telephone Encounter (Signed)
Given her ongoing symptoms, please schedule a follow-up with me or an APP next week. Thanks.

## 2019-07-13 NOTE — Telephone Encounter (Signed)
Patient continues complain of rectal pain despite 14 day use of Anusol suppositories and daily use of Recticare. Please advise.

## 2019-07-14 NOTE — Telephone Encounter (Signed)
Patient scheduled to see Dr. Orvan Falconer on 2/23 at 3:40 pm.

## 2019-07-18 DIAGNOSIS — M25559 Pain in unspecified hip: Secondary | ICD-10-CM | POA: Diagnosis not present

## 2019-07-18 DIAGNOSIS — G5600 Carpal tunnel syndrome, unspecified upper limb: Secondary | ICD-10-CM | POA: Diagnosis not present

## 2019-07-18 DIAGNOSIS — M961 Postlaminectomy syndrome, not elsewhere classified: Secondary | ICD-10-CM | POA: Diagnosis not present

## 2019-07-18 DIAGNOSIS — G894 Chronic pain syndrome: Secondary | ICD-10-CM | POA: Diagnosis not present

## 2019-07-26 ENCOUNTER — Telehealth: Payer: Self-pay | Admitting: Cardiovascular Disease

## 2019-07-26 NOTE — Telephone Encounter (Signed)
Virtual Visit Pre-Appointment Phone Call  "(Name), I am calling you today to discuss your upcoming appointment. We are currently trying to limit exposure to the virus that causes COVID-19 by seeing patients at home rather than in the office."  1. "What is the BEST phone number to call the day of the visit?" - [phone]  2. "Do you have or have access to (through a family member/friend) a smartphone with video capability that we can use for your visit?" a. If yes - list this number in appt notes as "cell" (if different from BEST phone #) and list the appointment type as a VIDEO visit in appointment notes b. If no - list the appointment type as a PHONE visit in appointment notes  3. Confirm consent - "In the setting of the current Covid19 crisis, you are scheduled for a (phone or video) visit with your provider on (date) at (time).  Just as we do with many in-office visits, in order for you to participate in this visit, we must obtain consent.  If you'd like, I can send this to your mychart (if signed up) or email for you to review.  Otherwise, I can obtain your verbal consent now.  All virtual visits are billed to your insurance company just like a normal visit would be.  By agreeing to a virtual visit, we'd like you to understand that the technology does not allow for your provider to perform an examination, and thus may limit your provider's ability to fully assess your condition. If your provider identifies any concerns that need to be evaluated in person, we will make arrangements to do so.  Finally, though the technology is pretty good, we cannot assure that it will always work on either your or our end, and in the setting of a video visit, we may have to convert it to a phone-only visit.  In either situation, we cannot ensure that we have a secure connection.  Are you willing to proceed?" STAFF: Did the patient verbally acknowledge consent to telehealth visit? Document YES/NO here: YES    4. Advise patient to be prepared - "Two hours prior to your appointment, go ahead and check your blood pressure, pulse, oxygen saturation, and your weight (if you have the equipment to check those) and write them all down. When your visit starts, your provider will ask you for this information. If you have an Apple Watch or Kardia device, please plan to have heart rate information ready on the day of your appointment. Please have a pen and paper handy nearby the day of the visit as well."  5. Give patient instructions for MyChart download to smartphone OR Doximity/Doxy.me as below if video visit (depending on what platform provider is using)  6. Inform patient they will receive a phone call 15 minutes prior to their appointment time (may be from unknown caller ID) so they should be prepared to answer    TELEPHONE CALL NOTE  ARIELLE EBER has been deemed a candidate for a follow-up tele-health visit to limit community exposure during the Covid-19 pandemic. I spoke with the patient via phone to ensure availability of phone/video source, confirm preferred email & phone number, and discuss instructions and expectations.  I reminded DEONI COSEY to be prepared with any vital sign and/or heart rhythm information that could potentially be obtained via home monitoring, at the time of her visit. I reminded CLIFFORD COUDRIET to expect a phone call prior to her visit.  Megan Salon 07/26/2019 10:35 AM   INSTRUCTIONS FOR DOWNLOADING THE MYCHART APP TO SMARTPHONE  - The patient must first make sure to have activated MyChart and know their login information - If Apple, go to [employer] and type in MyChart in the search bar and download the app. If Android, ask patient to go to [employer] and type in [place] in the search bar and download the app. The app is free but as with any other app downloads, their phone may require them to verify saved payment information or Apple/Android  password.  - The patient will need to then log into the app with their MyChart username and password, and select [facility] as their healthcare provider to link the account. When it is time for your visit, go to the MyChart app, find appointments, and click Begin Video Visit. Be sure to Select Allow for your device to access the Microphone and Camera for your visit. You will then be connected, and your provider will be with you shortly.  **If they have any issues connecting, or need assistance please contact MyChart service desk [phone])**  **If using a computer, in order to ensure the best quality for their visit they will need to use either of the following Internet Browsers: [employer] or Google Chrome**  IF USING DOXIMITY or DOXY.ME - The patient will receive a link just prior to their visit by text.     FULL LENGTH CONSENT FOR TELE-HEALTH VISIT   I hereby voluntarily request, consent and authorize [facility] and its employed or contracted physicians, physician assistants, nurse practitioners or other licensed health care professionals (the Practitioner), to provide me with telemedicine health care services (the "Services") as deemed necessary by the treating Practitioner. I acknowledge and consent to receive the Services by the Practitioner via telemedicine. I understand that the telemedicine visit will involve communicating with the Practitioner through live audiovisual communication technology and the disclosure of certain medical information by electronic transmission. I acknowledge that I have been given the opportunity to request an in-person assessment or other available alternative prior to the telemedicine visit and am voluntarily participating in the telemedicine visit.  I understand that I have the right to withhold or withdraw my consent to the use of telemedicine in the course of my care at any time, without affecting my right to future care or treatment,  and that the Practitioner or I may terminate the telemedicine visit at any time. I understand that I have the right to inspect all information obtained and/or recorded in the course of the telemedicine visit and may receive copies of available information for a reasonable fee.  I understand that some of the potential risks of receiving the Services via telemedicine include:  Marland Kitchen Delay or interruption in medical evaluation due to technological equipment failure or disruption; . Information transmitted may not be sufficient (e.g. poor resolution of images) to allow for appropriate medical decision making by the Practitioner; and/or  . In rare instances, security protocols could fail, causing a breach of personal health information.  Furthermore, I acknowledge that it is my responsibility to provide information about my medical history, conditions and care that is complete and accurate to the best of my ability. I acknowledge that Practitioner's advice, recommendations, and/or decision may be based on factors not within their control, such as incomplete or inaccurate data provided by me or distortions of diagnostic images or specimens that may result from electronic transmissions. I understand that the  practice of medicine is not an Chief Strategy Officer and that Practitioner makes no warranties or guarantees regarding treatment outcomes. I acknowledge that I will receive a copy of this consent concurrently upon execution via email to the email address I last provided but may also request a printed copy by calling the office of [facility].    I understand that my insurance will be billed for this visit.   I have read or had this consent read to me. . I understand the contents of this consent, which adequately explains the benefits and risks of the Services being provided via telemedicine.  . I have been provided ample opportunity to ask questions regarding this consent and the Services and have had my questions  answered to my satisfaction. . I give my informed consent for the services to be provided through the use of telemedicine in my medical care  By participating in this telemedicine visit I agree to the above.

## 2019-07-28 DIAGNOSIS — Z23 Encounter for immunization: Secondary | ICD-10-CM | POA: Diagnosis not present

## 2019-07-31 ENCOUNTER — Telehealth (INDEPENDENT_AMBULATORY_CARE_PROVIDER_SITE_OTHER): Payer: Medicare Other | Admitting: Cardiovascular Disease

## 2019-07-31 ENCOUNTER — Encounter: Payer: Self-pay | Admitting: Cardiovascular Disease

## 2019-07-31 VITALS — BP 128/58 | HR 85 | Ht 62.0 in | Wt 99.0 lb

## 2019-07-31 DIAGNOSIS — Z955 Presence of coronary angioplasty implant and graft: Secondary | ICD-10-CM

## 2019-07-31 DIAGNOSIS — I6523 Occlusion and stenosis of bilateral carotid arteries: Secondary | ICD-10-CM

## 2019-07-31 DIAGNOSIS — I25118 Atherosclerotic heart disease of native coronary artery with other forms of angina pectoris: Secondary | ICD-10-CM

## 2019-07-31 DIAGNOSIS — I350 Nonrheumatic aortic (valve) stenosis: Secondary | ICD-10-CM

## 2019-07-31 DIAGNOSIS — R600 Localized edema: Secondary | ICD-10-CM

## 2019-07-31 DIAGNOSIS — R002 Palpitations: Secondary | ICD-10-CM

## 2019-07-31 DIAGNOSIS — E785 Hyperlipidemia, unspecified: Secondary | ICD-10-CM

## 2019-07-31 NOTE — Progress Notes (Signed)
Virtual Visit via Telephone Note   This visit type was conducted due to national recommendations for restrictions regarding the COVID-19 Pandemic (e.g. social distancing) in an effort to limit this patient's exposure and mitigate transmission in our community.  Due to her co-morbid illnesses, this patient is at least at moderate risk for complications without adequate follow up.  This format is felt to be most appropriate for this patient at this time.  The patient did not have access to video technology/had technical difficulties with video requiring transitioning to audio format only (telephone).  All issues noted in this document were discussed and addressed.  No physical exam could be performed with this format.  Please refer to the patient's chart for her  consent to telehealth for [facility].   Date:  07/31/2019   ID:  Monna Fam, DOB [birth year], MRN [id number]  Patient Location: Home Provider Location: Office  PCP:  Rosalee Kaufman, PA-C  Cardiologist:  Kate Sable, MD  Electrophysiologist:  None   Evaluation Performed:  Follow-Up Visit  Chief Complaint:  CAD  History of Present Illness:    Mckenzie [name] is a 84 y.o. female with coronary artery disease. She had an LAD stent placed in 2003 with coronary angiography performed in 2012 which showed patency of the stent.   The patient denies any symptoms of chest pain, shortness of breath, lightheadedness, dizziness, leg swelling, orthopnea, PND, and syncope.  She's had some bilateral ankle numbness. She has arthritis of the hips. She seldom has palpitations, primarily when lying down at night.  Soc Hx: She has a son who is blind who lives with her and she is his caretaker.  Past Medical History:  Diagnosis Date  . Aortic valve sclerosis    Mild, echo, April, 2012  . ARDS (adult respiratory distress syndrome) ([place]) 2003   with tracheostomy-peritonitis  . Arthritis   . Back pain    Need for surgery,  Dr. Glenna Fellows  . CAD (coronary artery disease)    Stent to LAD, 2003 [facility] / nuclear scan April, 201 to artifact / persistent shortness of breath / catheterization Oct 21, 2010... widely patent stent to the mid LAD, no significant obstructive disease, should be low risk    for back surgery  . Carotid arterial disease ([place])    Moderate-Dr. Trula Slade following  . Chronic back pain   . COPD (chronic obstructive pulmonary disease) ([place])   . Diastolic dysfunction    Echo, April, 2011   EF 65%  . Dysrhythmia    afib  . Ejection fraction    EF 65%, echo, September 15, 2010  . GERD (gastroesophageal reflux disease)   . Hemolytic anemia (H[facility])    Followed by Dr. Janae Sauce  . Hypertension   . Hypothyroidism   . Left ventricular hypertrophy    EF 65%, echo, April, 2011  . MR (mitral regurgitation)     Mild echo, 2012 ///  Mild per Echo 2009,  mild, echo, April, 2011  . Myocardial infarction ([place])   . Palpitations    Holter, September 16, 2010, PACs and PVCs,one 4 beat run of SVT  . Pneumonia 11-2011  . Preoperative clearance    Preop clearance for back surgery  . Shortness of breath    March, 2012  . UTI (urinary tract infection)    Chronic-on Macrodantin   Past Surgical History:  Procedure Laterality Date  . APPENDECTOMY    . BACK SURGERY    . BREAST LUMPECTOMY Left   .  CATARACT EXTRACTION W/PHACO Right 12/05/2012   Procedure: CATARACT EXTRACTION PHACO AND INTRAOCULAR LENS PLACEMENT (IOC);  Surgeon: Gemma Payor, MD;  Location: [facility];  Service: Ophthalmology;  Laterality: Right;  CDE:15.30  . CATARACT EXTRACTION W/PHACO Left 12/15/2012   Procedure: CATARACT EXTRACTION PHACO AND INTRAOCULAR LENS PLACEMENT (IOC);  Surgeon: Gemma Payor, MD;  Location: [facility];  Service: Ophthalmology;  Laterality: Left;  CDE:22.56  . CORONARY ANGIOPLASTY WITH STENT PLACEMENT  2003  . repair of ruptured lumbar discs  12-2011  . SPINE SURGERY     lumbar disk removal by Dr. Laurian Brim [facility]  . THYROID SURGERY   08/2009   Benign adenomas  . TONSILLECTOMY    . TRACHEOSTOMY  2003   Following ARDS      Current Meds  Medication Sig  . aspirin 81 MG tablet Take 81 mg by mouth daily.  Marland Kitchen atorvastatin (LIPITOR) 10 MG tablet Take 10 mg by mouth daily.   . benazepril (LOTENSIN) 5 MG tablet Take 5 mg by mouth daily.   . Calcium Carbonate (CALCIUM 600 PO) Take 1,200 mg by mouth daily.  . Cholecalciferol (VITAMIN D3) 1000 UNITS CAPS Take 1,000 Units by mouth daily. Take one tab daily  . diphenhydrAMINE (BENADRYL) 25 mg capsule Take 25 mg by mouth daily as needed for sleep.   Marland Kitchen esomeprazole (NEXIUM) 40 MG capsule Take 40 mg by mouth daily before breakfast.   . Ferrous Gluconate (IRON) 240 (27 FE) MG TABS Take 1 tablet by mouth daily.  Marland Kitchen HYDROcodone-acetaminophen (NORCO) 10-325 MG tablet Take 1 tablet by mouth every 4 (four) hours.  . hydrocortisone (ANUSOL-HC) 2.5 % rectal cream as needed.  . hyoscyamine (LEVSIN SL) 0.125 MG SL tablet Take 0.125 mg by mouth every 6 (six) hours as needed for cramping or diarrhea or loose stools.   Marland Kitchen levothyroxine (SYNTHROID, LEVOTHROID) 75 MCG tablet Take 75 mcg by mouth daily before breakfast.   . Melatonin 10 MG TABS Take 1 tablet by mouth at bedtime.  . metoprolol tartrate (LOPRESSOR) 25 MG tablet TAKE (1) TABLET TWICE DAILY.  . Multiple Vitamin (MULTIVITAMIN) tablet Take 1 tablet by mouth daily.   . nitroGLYCERIN (NITROSTAT) 0.4 MG SL tablet Place one tablet under tongue every 5 minutes up to 3 doses as needed for chest pain. No more than 3 doses over a 15 minute period.  Marland Kitchen nystatin cream (MYCOSTATIN) as needed.  . polyethylene glycol (MIRALAX / GLYCOLAX) 17 g packet Take 17 g by mouth every morning.  Marland Kitchen PREMARIN vaginal cream as needed.  . raloxifene (EVISTA) 60 MG tablet Take 60 mg by mouth daily.  . tamsulosin (FLOMAX) 0.4 MG CAPS capsule Take 0.4 mg by mouth daily.  . temazepam (RESTORIL) 30 MG capsule Take 30 mg by mouth at bedtime.   . traZODone (DESYREL) 50 MG  tablet   . tretinoin (RETIN-A) 0.05 % cream Apply 1 application topically at bedtime.      Allergies:   Tetracycline   Social History   Tobacco Use  . Smoking status: Former Smoker    Packs/day: 1.00    Years: 20.00    Pack years: 20.00    Types: Cigarettes    Quit date: 06/15/2000    Years since quitting: 19.1  . Smokeless tobacco: Never Used  Substance Use Topics  . Alcohol use: No  . Drug use: No     Family Hx: The patient's family history includes Diabetes in her son; Heart failure in her father.  ROS:  Please see the history of present illness.     All other systems reviewed and are negative.   Prior CV studies:   The following studies were reviewed today:  Carotid artery Dopplers on 10/22/18showed less than 40% stenosis bilaterally.  Echocardiogram 04/20/18:  - Left ventricle: The cavity size was normal. Wall thickness was normal. Systolic function was normal. The estimated ejection fraction was in the range of 60% to 65%. Wall motion was normal; there were no regional wall motion abnormalities. - Aortic valve: Mildly calcified annulus. Trileaflet; mildly thickened leaflets. There was mild stenosis. Mean gradient (S): 11 mm Hg. Valve area (VTI): 1.78 cm^2. Valve area (Vmax): 1.63 cm^2. Valve area (Vmean): 1.77 cm^2. - Mitral valve: Mildly calcified annulus. Mildly thickened leaflets . There was mild regurgitation. - Pulmonary arteries: Systolic pressure was mildly increased. PA peak pressure: 38 mm Hg (S).  Labs/Other Tests and Data Reviewed:    EKG:  No ECG reviewed.  Recent Labs: No results found for requested labs within last 8760 hours.   Recent Lipid Panel No results found for: CHOL, TRIG, HDL, CHOLHDL, LDLCALC, LDLDIRECT  Wt Readings from Last 3 Encounters:  07/31/19 99 lb (44.9 kg)  06/12/19 101 lb 6 oz (46 kg)  05/09/18 119 lb 7.8 oz (54.2 kg)     Objective:    Vital Signs:  BP (!) 128/58   Pulse 85   Ht 5\' 2"   (1.575 m)   Wt 99 lb (44.9 kg)   BMI 18.11 kg/m    VITAL SIGNS:  reviewed  ASSESSMENT & PLAN:    1. CAD with LAD stent:Symptomatically stable.Continue ASA, atorvastatin, and metoprolol.  2. Carotid artery disease: Dopplers reviewed above. Continue ASA and statin.  3. Hyperlipidemia: Continue statin.  4. Palpitations/PSVT:Symptomaticallystable.Continue metoprolol.  5.  Aortic stenosis: Mild in severity by echocardiogram in November 2019.  I will monitor.  6. Bilateral leg edema: This is likely due to venous insufficiency. I recommended compression stockings but she has a difficult time using them. She is no longer on Lasix but denies any current symptoms.  7. HTN: BP is normal. No changes.   COVID-19 Education: The signs and symptoms of COVID-19 were discussed with the patient and how to seek care for testing (follow up with PCP or arrange E-visit).  The importance of social distancing was discussed today.  Time:   Today, I have spent 10 minutes with the patient with telehealth technology discussing the above problems.     Medication Adjustments/Labs and Tests Ordered: Current medicines are reviewed at length with the patient today.  Concerns regarding medicines are outlined above.   Tests Ordered: No orders of the defined types were placed in this encounter.   Medication Changes: No orders of the defined types were placed in this encounter.   Follow Up:  Office visit 1 yr  Signed, December 2019, MD  07/31/2019 12:04 PM    [facility]

## 2019-07-31 NOTE — Patient Instructions (Signed)
Medication Instructions:  Your physician recommends that you continue on your current medications as directed. Please refer to the Current Medication list given to you today.  *If you need a refill on your cardiac medications before your next appointment, please call your pharmacy*  Lab Work: None today If you have labs (blood work) drawn today and your tests are completely normal, you will receive your results only by: . MyChart Message (if you have MyChart) OR . A paper copy in the mail If you have any lab test that is abnormal or we need to change your treatment, we will call you to review the results.  Testing/Procedures: None today  Follow-Up: At [facility], you and your health needs are our priority.  As part of our continuing mission to provide you with exceptional heart care, we have created designated Provider Care Teams.  These Care Teams include your primary Cardiologist (physician) and Advanced Practice Providers (APPs -  Physician Assistants and Nurse Practitioners) who all work together to provide you with the care you need, when you need it.  Your next appointment:   12 months  The format for your next appointment:   In Person  Provider:   Suresh Koneswaran, MD  Other Instructions None      Thank you for choosing [facility] !         

## 2019-08-08 ENCOUNTER — Ambulatory Visit: Payer: Medicare Other | Admitting: Gastroenterology

## 2019-08-11 DIAGNOSIS — E039 Hypothyroidism, unspecified: Secondary | ICD-10-CM | POA: Diagnosis not present

## 2019-08-11 DIAGNOSIS — I10 Essential (primary) hypertension: Secondary | ICD-10-CM | POA: Diagnosis not present

## 2019-08-15 ENCOUNTER — Ambulatory Visit: Payer: Medicare Other | Admitting: Gastroenterology

## 2019-08-21 NOTE — Progress Notes (Signed)
Referring Provider: Royann Shivers, * Primary Care Physician:  Royann Shivers, PA-C  Chief complaint:  Rectal pain   IMPRESSION:  Recent suspected fecal impaction with overflow diarrhea Persistent rectal pain despite improvement in fecal impaction Normal colonoscopy over 10 years ago in [place] per patient report No known family history of colon cancer or polyps Opiate use preceding fecal impaction  Now with diarrhea after treatment for fecal impaction. Must screen for fecal impaction. Colonoscopy recommended, however, the patient wants to only pursue endoscopy as a last resort. Therefore, discussed use of contrasted CT of the abd/pelvis first. If non-diagnostic, will proceed with colonoscopy.     PLAN: Fecal calprotectin, GI pathogen panel CT abd/pelvis with contrast Colonoscopy if CT scan is nondiagnostic Hold Miralax 17 g daily and Metamucil QD until diarrhea resolves Obtain records from colonoscopy at [facility] 10 years ago   Please see the "Patient Instructions" section for addition details about the plan.  HPI: Mckenzie [name] is a 84 y.o. female under evaluation of rectal pain attributed to fecal impaction with overflow diarrhea.  Initial consultation performed 06/12/2019. She returns in scheduled follow-up.  The interval history is obtained to the patient and review of her electronic health record. She has chronic back pain with tens implanted and chronic opiate use, coronary artery disease with LAD stent placed in 2003, diastolic dysfunction with preserved EF, COPD, hypothyroidism. The patient thinks she has IBS.  At the time of her initial consultation she reported several months of progressive constipation that was temporally associated with increasing use of opiates. Hard painful large stools. Intermittent straining with sense of incomplete evacuation.  Frequent diarrhea.  No fecal incontinence.  Uses MiraLAX daily.  Started a trial of  lidocaine 2% mucosal jelly in September.  She was treated with Clenpiq and asked to drink plenty of water.  After improvement in her impaction she was asked to resume MiraLAX 17 g daily and add Metamucil daily.  RectiCare was also recommended.  She called 07/05/2019 with diarrhea from the MiraLAX and did not find RectiCare cream helping her rectal pain. Metamucil and Miralax are causing diarrhea.  She was prescribed Anusol HC 25 mg PR twice daily. Flexible sigmoidoscopy was recommended.   Constant rectal pain. No longer feeling a fullness. No noted pain with defecation.  She has no control over her bowel habits. Wears Depends. Unsure of how many bowel movements she has. No blood or mucous. Appetite is good. Weight is stable. No blood or mucous in the stool.  Has not use Metamucil or Miralax since Saturday and has continued to have diarrhea.   Screening colonoscopy in past at [facility] at least 10 years ago.  The patient remembers this being normal.  She is very resistant to having another colonoscopy.      Past Medical History:  Diagnosis Date  . Aortic valve sclerosis    Mild, echo, April, 2012  . ARDS (adult respiratory distress syndrome) (HCC) 2003   with tracheostomy-peritonitis  . Arthritis   . Back pain    Need for surgery, Dr. Trey Sailors  . CAD (coronary artery disease)    Stent to LAD, 2003 [facility] / nuclear scan April, 201 to artifact / persistent shortness of breath / catheterization Oct 21, 2010... widely patent stent to the mid LAD, no significant obstructive disease, should be low risk    for back surgery  . Carotid arterial disease (HCC)    Moderate-Dr. Myra Gianotti following  . Chronic back pain   .  COPD (chronic obstructive pulmonary disease) ([place])   . Diastolic dysfunction    Echo, April, 2011   EF 65%  . Dysrhythmia    afib  . Ejection fraction    EF 65%, echo, September 15, 2010  . GERD (gastroesophageal reflux disease)   . Hemolytic anemia (HCC)    Followed by Dr.  Janae Sauce  . Hypertension   . Hypothyroidism   . Left ventricular hypertrophy    EF 65%, echo, April, 2011  . MR (mitral regurgitation)     Mild echo, 2012 ///  Mild per Echo 2009,  mild, echo, April, 2011  . Myocardial infarction ([place])   . Palpitations    Holter, September 16, 2010, PACs and PVCs,one 4 beat run of SVT  . Pneumonia 11-2011  . Preoperative clearance    Preop clearance for back surgery  . Shortness of breath    March, 2012  . UTI (urinary tract infection)    Chronic-on Macrodantin    Past Surgical History:  Procedure Laterality Date  . APPENDECTOMY    . BACK SURGERY    . BREAST LUMPECTOMY Left   . CATARACT EXTRACTION W/PHACO Right 12/05/2012   Procedure: CATARACT EXTRACTION PHACO AND INTRAOCULAR LENS PLACEMENT (IOC);  Surgeon: Tonny Branch, MD;  Location: [facility];  Service: Ophthalmology;  Laterality: Right;  CDE:15.30  . CATARACT EXTRACTION W/PHACO Left 12/15/2012   Procedure: CATARACT EXTRACTION PHACO AND INTRAOCULAR LENS PLACEMENT (IOC);  Surgeon: Tonny Branch, MD;  Location: [facility];  Service: Ophthalmology;  Laterality: Left;  CDE:22.56  . CORONARY ANGIOPLASTY WITH STENT PLACEMENT  2003  . repair of ruptured lumbar discs  12-2011  . SPINE SURGERY     lumbar disk removal by Dr. Francesco [facility] at [facility]  . THYROID SURGERY  08/2009   Benign adenomas  . TONSILLECTOMY    . TRACHEOSTOMY  2003   Following ARDS     Current Outpatient Medications  Medication Sig Dispense Refill  . aspirin 81 MG tablet Take 81 mg by mouth daily.    Marland Kitchen atorvastatin (LIPITOR) 10 MG tablet Take 10 mg by mouth daily.     . benazepril (LOTENSIN) 5 MG tablet Take 5 mg by mouth daily.     . Calcium Carbonate (CALCIUM 600 PO) Take 1,200 mg by mouth daily.    . Cholecalciferol (VITAMIN D3) 1000 UNITS CAPS Take 1,000 Units by mouth daily. Take one tab daily    . diphenhydrAMINE (BENADRYL) 25 mg capsule Take 25 mg by mouth daily as needed for sleep.     Marland Kitchen esomeprazole (NEXIUM) 40 MG capsule Take 40  mg by mouth daily before breakfast.     . Ferrous Gluconate (IRON) 240 (27 FE) MG TABS Take 1 tablet by mouth daily.    Marland Kitchen HYDROcodone-acetaminophen (NORCO) 10-325 MG tablet Take 1 tablet by mouth every 4 (four) hours.    . hydrocortisone (ANUSOL-HC) 2.5 % rectal cream as needed.    . hyoscyamine (LEVSIN SL) 0.125 MG SL tablet Take 0.125 mg by mouth every 6 (six) hours as needed for cramping or diarrhea or loose stools.     Marland Kitchen levothyroxine (SYNTHROID, LEVOTHROID) 75 MCG tablet Take 75 mcg by mouth daily before breakfast.     . Melatonin 10 MG TABS Take 1 tablet by mouth at bedtime.    . metoprolol tartrate (LOPRESSOR) 25 MG tablet TAKE (1) TABLET TWICE DAILY. 60 tablet 0  . Multiple Vitamin (MULTIVITAMIN) tablet Take 1 tablet by mouth daily.     . nitroGLYCERIN (  NITROSTAT) 0.4 MG SL tablet Place one tablet under tongue every 5 minutes up to 3 doses as needed for chest pain. No more than 3 doses over a 15 minute period. 100 tablet 0  . nystatin cream (MYCOSTATIN) as needed.    . polyethylene glycol (MIRALAX / GLYCOLAX) 17 g packet Take 17 g by mouth every morning.    Marland Kitchen PREMARIN vaginal cream as needed.    . raloxifene (EVISTA) 60 MG tablet Take 60 mg by mouth daily.    . tamsulosin (FLOMAX) 0.4 MG CAPS capsule Take 0.4 mg by mouth daily.    . temazepam (RESTORIL) 30 MG capsule Take 30 mg by mouth at bedtime.     . traZODone (DESYREL) 50 MG tablet     . tretinoin (RETIN-A) 0.05 % cream Apply 1 application topically at bedtime.      No current facility-administered medications for this visit.    Allergies as of 08/22/2019 - Review Complete 07/31/2019  Allergen Reaction Noted  . Tetracycline Nausea Only     Family History  Problem Relation Age of Onset  . Heart failure Father   . Diabetes Son     Social History   Socioeconomic History  . Marital status: Married    Spouse name: Not on file  . Number of children: 1  . Years of education: Not on file  . Highest education level: Not on  file  Occupational History  . Occupation: Retired  Tobacco Use  . Smoking status: Former Smoker    Packs/day: 1.00    Years: 20.00    Pack years: 20.00    Types: Cigarettes    Quit date: 06/15/2000    Years since quitting: 19.1  . Smokeless tobacco: Never Used  Substance and Sexual Activity  . Alcohol use: No  . Drug use: No  . Sexual activity: Not on file  Other Topics Concern  . Not on file  Social History Narrative   Regular Exercise: Yes-Walk   Social Determinants of Health   Financial Resource Strain:   . Difficulty of Paying Living Expenses: Not on file  Food Insecurity:   . Worried About Programme researcher, broadcasting/film/video in the Last Year: Not on file  . Ran Out of Food in the Last Year: Not on file  Transportation Needs:   . Lack of Transportation (Medical): Not on file  . Lack of Transportation (Non-Medical): Not on file  Physical Activity:   . Days of Exercise per Week: Not on file  . Minutes of Exercise per Session: Not on file  Stress:   . Feeling of Stress : Not on file  Social Connections:   . Frequency of Communication with Friends and Family: Not on file  . Frequency of Social Gatherings with Friends and Family: Not on file  . Attends Religious Services: Not on file  . Active Member of Clubs or Organizations: Not on file  . Attends Banker Meetings: Not on file  . Marital Status: Not on file  Intimate Partner Violence:   . Fear of Current or Ex-Partner: Not on file  . Emotionally Abused: Not on file  . Physically Abused: Not on file  . Sexually Abused: Not on file     Physical Exam: General:   Alert,  well-nourished, pleasant and cooperative in NAD Abdomen:  Soft, thin, nontender, nondistended, normal bowel sounds, no rebound or guarding. No hepatosplenomegaly.   Neurologic:  Alert and  oriented x4;  grossly nonfocal Skin: No rash or  bruise Psych:  Alert and cooperative. Normal mood and affect.     Haifa Hatton L. Orvan Falconer, MD, MPH 08/21/2019, 4:56  PM

## 2019-08-22 ENCOUNTER — Ambulatory Visit (INDEPENDENT_AMBULATORY_CARE_PROVIDER_SITE_OTHER): Payer: Medicare Other | Admitting: Gastroenterology

## 2019-08-22 ENCOUNTER — Encounter: Payer: Self-pay | Admitting: Gastroenterology

## 2019-08-22 VITALS — BP 130/64 | HR 68 | Temp 97.9°F | Ht 62.0 in | Wt 100.0 lb

## 2019-08-22 DIAGNOSIS — K6289 Other specified diseases of anus and rectum: Secondary | ICD-10-CM | POA: Diagnosis not present

## 2019-08-22 DIAGNOSIS — K5641 Fecal impaction: Secondary | ICD-10-CM

## 2019-08-22 NOTE — Patient Instructions (Signed)
Your provider has requested that you go to the basement level for lab work before leaving today. Press "B" on the elevator. The lab is located at the first door on the left as you exit the elevator.  Hold miralax 17 grams daily and Metamucil daily until diarrhea resolves.   You have been scheduled for a CT scan of the abdomen and pelvis at [facility], 1st floor Radiology.  You are scheduled on 09-06-19 at 10:00 am. You should arrive 15 minutes prior to your appointment time for registration. Please follow the written instructions below on the day of your exam:  WARNING: IF YOU ARE ALLERGIC TO IODINE/X-RAY DYE, PLEASE NOTIFY RADIOLOGY IMMEDIATELY AT [phone]! YOU WILL BE GIVEN A 13 HOUR PREMEDICATION PREP.  1) Do not eat anything after 6:00 am (4 hours prior to your test) 2) You have been given 2 bottles of oral contrast to drink. The solution may taste better if refrigerated, but do NOT add ice or any other liquid to this solution. Shake well before drinking.    Drink 1 bottle of contrast @ 8:00 am (2 hours prior to your exam)  Drink 1 bottle of contrast @ 9:00 am (1 hour prior to your exam)  You may take any medications as prescribed with a small amount of water, if necessary. If you take any of the following medications: METFORMIN, GLUCOPHAGE, GLUCOVANCE, AVANDAMET, RIOMET, FORTAMET, [place] MET, JANUMET, GLUMETZA or METAGLIP, you MAY be asked to HOLD this medication 48 hours AFTER the exam.  The purpose of you drinking the oral contrast is to aid in the visualization of your intestinal tract. The contrast solution may cause some diarrhea. Depending on your individual set of symptoms, you may also receive an intravenous injection of x-ray contrast/dye. Plan on being at [facility] for 30 minutes or longer, depending on the type of exam you are having performed.  This test typically takes 30-45 minutes to complete.  If you have any questions regarding your exam or if you need to  reschedule, you may call [phone] between the hours of 8:00 am and 5:00 pm, Monday-Friday.  ________________________________________________________________________

## 2019-08-23 DIAGNOSIS — Z79899 Other long term (current) drug therapy: Secondary | ICD-10-CM | POA: Diagnosis not present

## 2019-08-23 DIAGNOSIS — Z79891 Long term (current) use of opiate analgesic: Secondary | ICD-10-CM | POA: Diagnosis not present

## 2019-08-23 DIAGNOSIS — G894 Chronic pain syndrome: Secondary | ICD-10-CM | POA: Diagnosis not present

## 2019-08-23 DIAGNOSIS — M1612 Unilateral primary osteoarthritis, left hip: Secondary | ICD-10-CM | POA: Diagnosis not present

## 2019-08-23 DIAGNOSIS — M961 Postlaminectomy syndrome, not elsewhere classified: Secondary | ICD-10-CM | POA: Diagnosis not present

## 2019-08-23 DIAGNOSIS — M5030 Other cervical disc degeneration, unspecified cervical region: Secondary | ICD-10-CM | POA: Diagnosis not present

## 2019-08-24 DIAGNOSIS — N3946 Mixed incontinence: Secondary | ICD-10-CM | POA: Diagnosis not present

## 2019-08-24 DIAGNOSIS — R3914 Feeling of incomplete bladder emptying: Secondary | ICD-10-CM | POA: Diagnosis not present

## 2019-08-24 DIAGNOSIS — N3020 Other chronic cystitis without hematuria: Secondary | ICD-10-CM | POA: Diagnosis not present

## 2019-08-24 DIAGNOSIS — R350 Frequency of micturition: Secondary | ICD-10-CM | POA: Diagnosis not present

## 2019-08-25 DIAGNOSIS — Z23 Encounter for immunization: Secondary | ICD-10-CM | POA: Diagnosis not present

## 2019-09-04 DIAGNOSIS — M1612 Unilateral primary osteoarthritis, left hip: Secondary | ICD-10-CM | POA: Diagnosis not present

## 2019-09-06 ENCOUNTER — Other Ambulatory Visit: Payer: Self-pay

## 2019-09-06 ENCOUNTER — Ambulatory Visit (HOSPITAL_COMMUNITY)
Admission: RE | Admit: 2019-09-06 | Discharge: 2019-09-06 | Disposition: A | Discharge status: Home / Self Care | Payer: Medicare Other | Source: Ambulatory Visit | Attending: Gastroenterology | Admitting: Gastroenterology

## 2019-09-06 DIAGNOSIS — K5641 Fecal impaction: Secondary | ICD-10-CM | POA: Diagnosis not present

## 2019-09-06 DIAGNOSIS — K6289 Other specified diseases of anus and rectum: Secondary | ICD-10-CM | POA: Diagnosis not present

## 2019-09-06 DIAGNOSIS — K8020 Calculus of gallbladder without cholecystitis without obstruction: Secondary | ICD-10-CM | POA: Diagnosis not present

## 2019-09-06 LAB — POCT I-STAT CREATININE: Creatinine, Ser: 0.7 mg/dL (ref 0.44–1.00)

## 2019-09-06 MED ORDER — IOHEXOL 300 MG/ML  SOLN
75.0000 mL | Freq: Once | INTRAMUSCULAR | Status: AC | PRN
  Administered 2019-09-06: 75 mL via INTRAVENOUS

## 2019-09-06 MED ORDER — IOHEXOL 300 MG/ML  SOLN: 100.0000 mL | Freq: Once | INTRAMUSCULAR | Status: DC | PRN

## 2019-09-07 ENCOUNTER — Telehealth: Payer: Self-pay | Admitting: Gastroenterology

## 2019-09-07 NOTE — Telephone Encounter (Signed)
The patient asked me if Dr. Orvan Falconer still wanted her to have her labs completed. I told the patient she does still want the labs. The patient did not inquire about labs in [place]. She wanted to inform the clinic that she did not know she was supposed to have the labs completed the day of her visit. She was informed this was okay and to complete them when she could.

## 2019-09-07 NOTE — Telephone Encounter (Signed)
Pt inquired whether she can get labs done locally in [place].

## 2019-09-13 ENCOUNTER — Other Ambulatory Visit (INDEPENDENT_AMBULATORY_CARE_PROVIDER_SITE_OTHER): Payer: Medicare Other

## 2019-09-13 DIAGNOSIS — K6289 Other specified diseases of anus and rectum: Secondary | ICD-10-CM | POA: Diagnosis not present

## 2019-09-13 DIAGNOSIS — R3914 Feeling of incomplete bladder emptying: Secondary | ICD-10-CM | POA: Diagnosis not present

## 2019-09-13 DIAGNOSIS — K5641 Fecal impaction: Secondary | ICD-10-CM

## 2019-09-13 DIAGNOSIS — N3020 Other chronic cystitis without hematuria: Secondary | ICD-10-CM | POA: Diagnosis not present

## 2019-09-13 LAB — BASIC METABOLIC PANEL
BUN: 23 mg/dL (ref 6–23)
CO2: 31 mEq/L (ref 19–32)
Calcium: 9.4 mg/dL (ref 8.4–10.5)
Chloride: 103 mEq/L (ref 96–112)
Creatinine, Ser: 0.69 mg/dL (ref 0.40–1.20)
GFR: 81.45 mL/min (ref >60.00)
Glucose, Bld: 98 mg/dL (ref 70–99)
Potassium: 4.1 mEq/L (ref 3.5–5.1)
Sodium: 138 mEq/L (ref 135–145)

## 2019-09-18 ENCOUNTER — Other Ambulatory Visit: Payer: Medicare Other

## 2019-09-18 DIAGNOSIS — K5641 Fecal impaction: Secondary | ICD-10-CM

## 2019-09-18 DIAGNOSIS — K6289 Other specified diseases of anus and rectum: Secondary | ICD-10-CM

## 2019-09-18 DIAGNOSIS — M542 Cervicalgia: Secondary | ICD-10-CM | POA: Diagnosis not present

## 2019-09-18 DIAGNOSIS — M25552 Pain in left hip: Secondary | ICD-10-CM | POA: Diagnosis not present

## 2019-09-18 DIAGNOSIS — M545 Low back pain: Secondary | ICD-10-CM | POA: Diagnosis not present

## 2019-09-20 ENCOUNTER — Other Ambulatory Visit: Payer: Self-pay | Admitting: Orthopaedic Surgery

## 2019-09-20 DIAGNOSIS — G894 Chronic pain syndrome: Secondary | ICD-10-CM | POA: Diagnosis not present

## 2019-09-20 DIAGNOSIS — M5416 Radiculopathy, lumbar region: Secondary | ICD-10-CM

## 2019-09-20 DIAGNOSIS — M1612 Unilateral primary osteoarthritis, left hip: Secondary | ICD-10-CM | POA: Diagnosis not present

## 2019-09-20 DIAGNOSIS — M545 Low back pain: Secondary | ICD-10-CM | POA: Diagnosis not present

## 2019-09-20 DIAGNOSIS — M961 Postlaminectomy syndrome, not elsewhere classified: Secondary | ICD-10-CM | POA: Diagnosis not present

## 2019-09-20 LAB — GI PROFILE, STOOL, PCR

## 2019-09-20 LAB — CALPROTECTIN, FECAL: Calprotectin, Fecal: 131 ug/g — ABNORMAL HIGH (ref 0–120)

## 2019-09-26 ENCOUNTER — Telehealth: Payer: Self-pay | Admitting: Gastroenterology

## 2019-09-26 NOTE — Telephone Encounter (Signed)
Patient is calling for an MRI appt

## 2019-09-26 NOTE — Telephone Encounter (Signed)
Left message for pt to call back  °

## 2019-10-03 DIAGNOSIS — N3001 Acute cystitis with hematuria: Secondary | ICD-10-CM | POA: Diagnosis not present

## 2019-10-03 DIAGNOSIS — R591 Generalized enlarged lymph nodes: Secondary | ICD-10-CM | POA: Diagnosis not present

## 2019-10-03 DIAGNOSIS — R300 Dysuria: Secondary | ICD-10-CM | POA: Diagnosis not present

## 2019-10-03 DIAGNOSIS — Z681 Body mass index (BMI) 19 or less, adult: Secondary | ICD-10-CM | POA: Diagnosis not present

## 2019-10-04 ENCOUNTER — Ambulatory Visit (INDEPENDENT_AMBULATORY_CARE_PROVIDER_SITE_OTHER): Payer: Medicare Other | Admitting: Physician Assistant

## 2019-10-04 ENCOUNTER — Ambulatory Visit: Payer: Medicare Other | Admitting: Physician Assistant

## 2019-10-04 ENCOUNTER — Encounter: Payer: Self-pay | Admitting: Physician Assistant

## 2019-10-04 VITALS — BP 120/40 | HR 76 | Temp 97.3°F | Ht 60.5 in | Wt 101.6 lb

## 2019-10-04 DIAGNOSIS — I6523 Occlusion and stenosis of bilateral carotid arteries: Secondary | ICD-10-CM | POA: Diagnosis not present

## 2019-10-04 DIAGNOSIS — K5900 Constipation, unspecified: Secondary | ICD-10-CM

## 2019-10-04 DIAGNOSIS — K589 Irritable bowel syndrome without diarrhea: Secondary | ICD-10-CM

## 2019-10-04 DIAGNOSIS — K582 Mixed irritable bowel syndrome: Secondary | ICD-10-CM

## 2019-10-04 NOTE — Progress Notes (Signed)
Reviewed and agree with management plans. ? ?Kamill Fulbright L. Maciah Schweigert, MD, MPH  ?

## 2019-10-04 NOTE — Patient Instructions (Signed)
If you are age 82 or older, your body mass index should be between 23-30. Your Body mass index is 19.52 kg/m. If this is out of the aforementioned range listed, please consider follow up with your Primary Care Provider.  If you are age 82 or younger, your body mass index should be between 19-25. Your Body mass index is 19.52 kg/m. If this is out of the aformentioned range listed, please consider follow up with your Primary Care Provider.   Continue Miralax 17 gm in 8 ounces of water daily. May increase to twice daily as needed.  Follow up as needed.

## 2019-10-04 NOTE — Progress Notes (Signed)
Subjective:    Patient ID: Mckenzie [name], female    DOB: Nov 24, 1935, 84 y.o.   MRN: [id number]  HPI Mckenzie [name] is an 84 year old white female, recently established with Dr. Tarri Glenn who comes in today for follow-up. Patient has history of coronary artery disease, carotid stenosis, mitral regurgitation, diastolic dysfunction, hypothyroidism, prior pneumonia with ARDS.  She was initially seen on 06/12/2019 with complaints of rectal pain attributed to fecal impaction with overflow diarrhea.  She was seen in follow-up on 08/22/2019 and was still having some complaints of rectal discomfort.  She had not had any pain with defecation.  She had no complaints of abdominal pain.  She had been using MiraLAX on a regular basis and Metamucil. Her last colonoscopy was done 10+ years ago in [place].  Colonoscopy was suggested but she was very reluctant.  She therefore underwent CT scan of the abdomen and pelvis which showed a small gallstone and a contracted gallbladder, CBD of 8 mm, there was no evidence of any bowel wall thickening of the small or large bowel, no fecal impaction or rectal abnormality noted though she did have moderate stool in the colon.  She was noted to have progressed chronic bladder outlet obstruction and multiple bladder diverticuli.  Patient has had longstanding issues with urinary incontinence and currently has a catheter in.  She also underwent GI path panel which was negative and fecal calprotectin which came back positive.  Today patient says that she has IBS, and has had symptoms for many years.  She usually tends more towards the constipation side.  She is doing well with a dose of Mylanta daily and a dose of MiraLAX daily.  With this she may have a bowel movement every 2 days.  Very occasionally she will have an episode of diarrhea which will last for a couple of hours to up to a day.  She has no current complaints of abdominal discomfort, no rectal discomfort, no bleeding.  She  says she is feeling pretty good.  She specifically states she does not want to have a colonoscopy.  Review of Systems Pertinent positive and negative review of systems were noted in the above HPI section.  All other review of systems was otherwise negative.  Outpatient Encounter Medications as of 10/04/2019  Medication Sig  . aspirin 81 MG tablet Take 81 mg by mouth daily.  Marland Kitchen atorvastatin (LIPITOR) 10 MG tablet Take 10 mg by mouth daily.   . benazepril (LOTENSIN) 5 MG tablet Take 5 mg by mouth daily.   . Calcium Carbonate (CALCIUM 600 PO) Take 1,200 mg by mouth daily.  . Cholecalciferol (VITAMIN D3) 1000 UNITS CAPS Take 1,000 Units by mouth daily. Take one tab daily  . diphenhydrAMINE (BENADRYL) 25 mg capsule Take 25 mg by mouth daily as needed for sleep.   Marland Kitchen esomeprazole (NEXIUM) 40 MG capsule Take 40 mg by mouth daily before breakfast.   . Ferrous Gluconate (IRON) 240 (27 FE) MG TABS Take 1 tablet by mouth daily.  Marland Kitchen HYDROcodone-acetaminophen (NORCO) 10-325 MG tablet Take 1 tablet by mouth every 4 (four) hours.  . hydrocortisone (ANUSOL-HC) 2.5 % rectal cream as needed.  . hyoscyamine (LEVSIN SL) 0.125 MG SL tablet Take 0.125 mg by mouth every 6 (six) hours as needed for cramping or diarrhea or loose stools.   Marland Kitchen levothyroxine (SYNTHROID, LEVOTHROID) 75 MCG tablet Take 75 mcg by mouth daily before breakfast.   . Melatonin 10 MG TABS Take 1 tablet by mouth at bedtime.  Marland Kitchen  metoprolol tartrate (LOPRESSOR) 25 MG tablet TAKE (1) TABLET TWICE DAILY.  . Multiple Vitamin (MULTIVITAMIN) tablet Take 1 tablet by mouth daily.   . nitroGLYCERIN (NITROSTAT) 0.4 MG SL tablet Place one tablet under tongue every 5 minutes up to 3 doses as needed for chest pain. No more than 3 doses over a 15 minute period.  Marland Kitchen nystatin cream (MYCOSTATIN) as needed.  . polyethylene glycol (MIRALAX / GLYCOLAX) 17 g packet Take 17 g by mouth every morning.  Marland Kitchen PREMARIN vaginal cream as needed.  . raloxifene (EVISTA) 60 MG tablet  Take 60 mg by mouth daily.  . tamsulosin (FLOMAX) 0.4 MG CAPS capsule Take 0.4 mg by mouth daily.  . temazepam (RESTORIL) 30 MG capsule Take 30 mg by mouth at bedtime.   . traZODone (DESYREL) 50 MG tablet   . tretinoin (RETIN-A) 0.05 % cream Apply 1 application topically at bedtime.   . [DISCONTINUED] diltiazem (CARDIZEM SR) 120 MG 12 hr capsule TAKE ONE CAPSULE BY MOUTH EVERY DAY  . [DISCONTINUED] diltiazem (TIAZAC) 120 MG 24 hr capsule Take 1 capsule (120 mg total) by mouth daily.   No facility-administered encounter medications on file as of 10/04/2019.   Allergies  Allergen Reactions  . Tetracycline Nausea Only   Patient Active Problem List   Diagnosis Date Noted  . Irritable bowel syndrome (IBS) 10/04/2019  . CAP (community acquired pneumonia) 05/06/2018  . Hypersomnolence 05/06/2018  . Acute renal failure (ARF) (HCC) 05/06/2018  . Dehydration 05/06/2018  . Hypotension 05/06/2018  . Current chronic use of systemic steroids 07/04/2015  . Nontoxic multinodular goiter 05/01/2014  . Easy bruisability 08/23/2013  . Occlusion and stenosis of carotid artery without mention of cerebral infarction 03/21/2012  . Hypothyroidism, postsurgical 02/02/2012  . Preoperative clearance   . CAD (coronary artery disease)   . Palpitations   . Ejection fraction   . MR (mitral regurgitation)   . Left ventricular hypertrophy   . Aortic valve sclerosis   . Carotid arterial disease (HCC)   . GERD (gastroesophageal reflux disease)   . Hypothyroidism   . ARDS (adult respiratory distress syndrome) (HCC)   . Hemolytic anemia (HCC)   . Diastolic dysfunction   . Shortness of breath   . Back pain   . Hyperlipidemia 09/10/2009  . GERD 09/10/2009   Social History   Socioeconomic History  . Marital status: Married    Spouse name: Not on file  . Number of children: 1  . Years of education: Not on file  . Highest education level: Not on file  Occupational History  . Occupation: Retired  Tobacco  Use  . Smoking status: Former Smoker    Packs/day: 1.00    Years: 20.00    Pack years: 20.00    Types: Cigarettes    Quit date: 06/15/2000    Years since quitting: 19.3  . Smokeless tobacco: Never Used  Substance and Sexual Activity  . Alcohol use: No  . Drug use: No  . Sexual activity: Not on file  Other Topics Concern  . Not on file  Social History Narrative   Regular Exercise: Yes-Walk   Social Determinants of Health   Financial Resource Strain:   . Difficulty of Paying Living Expenses:   Food Insecurity:   . Worried About Programme researcher, broadcasting/film/video in the Last Year:   . Barista in the Last Year:   Transportation Needs:   . Freight forwarder (Medical):   Marland Kitchen Lack of Transportation (Non-Medical):  Physical Activity:   . Days of Exercise per Week:   . Minutes of Exercise per Session:   Stress:   . Feeling of Stress :   Social Connections:   . Frequency of Communication with Friends and Family:   . Frequency of Social Gatherings with Friends and Family:   . Attends Religious Services:   . Active Member of Clubs or Organizations:   . Attends Banker Meetings:   Marland Kitchen Marital Status:   Intimate Partner Violence:   . Fear of Current or Ex-Partner:   . Emotionally Abused:   Marland Kitchen Physically Abused:   . Sexually Abused:     Ms. Flatley family history includes Diabetes in her son; Heart failure in her father.      Objective:    Vitals:   10/04/19 1354  BP: (!) 120/40  Pulse: 76  Temp: (!) 97.3 F (36.3 C)    Physical Exam Well-developed well-nourished elderly WF in no acute distress.  Height, Weight101, BMI 19.5  HEENT; nontraumatic normocephalic, EOMI, PER R LA, sclera anicteric. Oropharynx;not examined Neck; supple, no JVD Cardiovascular; regular rate and rhythm with S1-S2, systolic murmur rub or gallop Pulmonary; Clear bilaterally Abdomen; soft, nontender, nondistended, no palpable mass or hepatosplenomegaly, bowel sounds are active,  indwelling foley Rectal;not done Skin; benign exam, no jaundice rash or appreciable lesions Extremities; no clubbing cyanosis or edema skin warm and dry Neuro/Psych; alert and oriented x4, grossly nonfocal mood and affect appropriate       Assessment & Plan:   #72 84 year old white female with long history of IBS, generally constipation predominant.  Patient with an episode of fecal impaction a few months ago, then some persistent rectal discomfort which has since resolved.  CT of the abdomen and pelvis done recently with no evidence of rectal or bowel wall thickening, no mass.  No impaction.  She does have chronic bladder outlet obstruction with multiple bladder diverticuli.  Positive fecal calprotectin  Patient feels she is currently at her baseline regarding IBS, and doing well on MiraLAX daily and 1 dose of Mylanta daily.  She declines colonoscopy  #2 multiple comorbidities as outlined above  Plan; continue MiraLAX 17 g in 8 ounces of water daily, discussed doubling up on MiraLAX as needed.  She will continue Mylanta 30 cc daily  Continue Nexium 40 mg p.o. daily  Patient will follow up with Dr. Orvan Falconer or myself on an as-needed basis.  Plan  Amy Oswald Hillock PA-C 10/04/2019   Cc: Royann Shivers, *

## 2019-10-05 NOTE — Telephone Encounter (Signed)
Pt did not return phone call. 

## 2019-10-13 DIAGNOSIS — E039 Hypothyroidism, unspecified: Secondary | ICD-10-CM | POA: Diagnosis not present

## 2019-10-13 DIAGNOSIS — I10 Essential (primary) hypertension: Secondary | ICD-10-CM | POA: Diagnosis not present

## 2019-10-16 DIAGNOSIS — G894 Chronic pain syndrome: Secondary | ICD-10-CM | POA: Diagnosis not present

## 2019-10-16 DIAGNOSIS — M5030 Other cervical disc degeneration, unspecified cervical region: Secondary | ICD-10-CM | POA: Diagnosis not present

## 2019-10-16 DIAGNOSIS — M1612 Unilateral primary osteoarthritis, left hip: Secondary | ICD-10-CM | POA: Diagnosis not present

## 2019-10-16 DIAGNOSIS — M961 Postlaminectomy syndrome, not elsewhere classified: Secondary | ICD-10-CM | POA: Diagnosis not present

## 2019-10-23 ENCOUNTER — Other Ambulatory Visit: Payer: Medicare Other

## 2019-10-26 DIAGNOSIS — R300 Dysuria: Secondary | ICD-10-CM | POA: Diagnosis not present

## 2019-10-26 DIAGNOSIS — Z681 Body mass index (BMI) 19 or less, adult: Secondary | ICD-10-CM | POA: Diagnosis not present

## 2019-10-26 DIAGNOSIS — J441 Chronic obstructive pulmonary disease with (acute) exacerbation: Secondary | ICD-10-CM | POA: Diagnosis not present

## 2019-10-26 DIAGNOSIS — R591 Generalized enlarged lymph nodes: Secondary | ICD-10-CM | POA: Diagnosis not present

## 2019-10-26 DIAGNOSIS — R1310 Dysphagia, unspecified: Secondary | ICD-10-CM | POA: Diagnosis not present

## 2019-11-01 DIAGNOSIS — N3020 Other chronic cystitis without hematuria: Secondary | ICD-10-CM | POA: Diagnosis not present

## 2019-11-01 DIAGNOSIS — N320 Bladder-neck obstruction: Secondary | ICD-10-CM | POA: Diagnosis not present

## 2019-11-01 DIAGNOSIS — R3914 Feeling of incomplete bladder emptying: Secondary | ICD-10-CM | POA: Diagnosis not present

## 2019-11-13 DIAGNOSIS — I10 Essential (primary) hypertension: Secondary | ICD-10-CM | POA: Diagnosis not present

## 2019-11-13 DIAGNOSIS — Z87891 Personal history of nicotine dependence: Secondary | ICD-10-CM | POA: Diagnosis not present

## 2019-11-13 DIAGNOSIS — J441 Chronic obstructive pulmonary disease with (acute) exacerbation: Secondary | ICD-10-CM | POA: Diagnosis not present

## 2019-11-13 DIAGNOSIS — I872 Venous insufficiency (chronic) (peripheral): Secondary | ICD-10-CM | POA: Diagnosis not present

## 2019-11-15 DIAGNOSIS — M79605 Pain in left leg: Secondary | ICD-10-CM | POA: Diagnosis not present

## 2019-11-15 DIAGNOSIS — G894 Chronic pain syndrome: Secondary | ICD-10-CM | POA: Diagnosis not present

## 2019-11-15 DIAGNOSIS — Z9689 Presence of other specified functional implants: Secondary | ICD-10-CM | POA: Diagnosis not present

## 2019-11-15 DIAGNOSIS — M961 Postlaminectomy syndrome, not elsewhere classified: Secondary | ICD-10-CM | POA: Diagnosis not present

## 2019-12-03 DIAGNOSIS — T83098A Other mechanical complication of other indwelling urethral catheter, initial encounter: Secondary | ICD-10-CM | POA: Diagnosis not present

## 2019-12-20 DIAGNOSIS — R3914 Feeling of incomplete bladder emptying: Secondary | ICD-10-CM | POA: Diagnosis not present

## 2020-01-11 DIAGNOSIS — G894 Chronic pain syndrome: Secondary | ICD-10-CM | POA: Diagnosis not present

## 2020-01-11 DIAGNOSIS — M961 Postlaminectomy syndrome, not elsewhere classified: Secondary | ICD-10-CM | POA: Diagnosis not present

## 2020-01-11 DIAGNOSIS — M25559 Pain in unspecified hip: Secondary | ICD-10-CM | POA: Diagnosis not present

## 2020-01-11 DIAGNOSIS — G5600 Carpal tunnel syndrome, unspecified upper limb: Secondary | ICD-10-CM | POA: Diagnosis not present

## 2020-01-12 DIAGNOSIS — Z87891 Personal history of nicotine dependence: Secondary | ICD-10-CM | POA: Diagnosis not present

## 2020-01-12 DIAGNOSIS — I10 Essential (primary) hypertension: Secondary | ICD-10-CM | POA: Diagnosis not present

## 2020-01-12 DIAGNOSIS — I872 Venous insufficiency (chronic) (peripheral): Secondary | ICD-10-CM | POA: Diagnosis not present

## 2020-01-12 DIAGNOSIS — J441 Chronic obstructive pulmonary disease with (acute) exacerbation: Secondary | ICD-10-CM | POA: Diagnosis not present

## 2020-02-13 DIAGNOSIS — I10 Essential (primary) hypertension: Secondary | ICD-10-CM | POA: Diagnosis not present

## 2020-02-13 DIAGNOSIS — J441 Chronic obstructive pulmonary disease with (acute) exacerbation: Secondary | ICD-10-CM | POA: Diagnosis not present

## 2020-02-13 DIAGNOSIS — Z87891 Personal history of nicotine dependence: Secondary | ICD-10-CM | POA: Diagnosis not present

## 2020-02-13 DIAGNOSIS — I872 Venous insufficiency (chronic) (peripheral): Secondary | ICD-10-CM | POA: Diagnosis not present

## 2020-02-15 DIAGNOSIS — G5600 Carpal tunnel syndrome, unspecified upper limb: Secondary | ICD-10-CM | POA: Diagnosis not present

## 2020-02-15 DIAGNOSIS — M25559 Pain in unspecified hip: Secondary | ICD-10-CM | POA: Diagnosis not present

## 2020-02-15 DIAGNOSIS — M961 Postlaminectomy syndrome, not elsewhere classified: Secondary | ICD-10-CM | POA: Diagnosis not present

## 2020-02-15 DIAGNOSIS — G894 Chronic pain syndrome: Secondary | ICD-10-CM | POA: Diagnosis not present

## 2020-02-20 DIAGNOSIS — N390 Urinary tract infection, site not specified: Secondary | ICD-10-CM | POA: Diagnosis not present

## 2020-02-26 DIAGNOSIS — N3946 Mixed incontinence: Secondary | ICD-10-CM | POA: Diagnosis not present

## 2020-02-26 DIAGNOSIS — R339 Retention of urine, unspecified: Secondary | ICD-10-CM | POA: Diagnosis not present

## 2020-02-26 DIAGNOSIS — N3020 Other chronic cystitis without hematuria: Secondary | ICD-10-CM | POA: Diagnosis not present

## 2020-03-04 DIAGNOSIS — Z681 Body mass index (BMI) 19 or less, adult: Secondary | ICD-10-CM | POA: Diagnosis not present

## 2020-03-04 DIAGNOSIS — N390 Urinary tract infection, site not specified: Secondary | ICD-10-CM | POA: Diagnosis not present

## 2020-03-04 DIAGNOSIS — B373 Candidiasis of vulva and vagina: Secondary | ICD-10-CM | POA: Diagnosis not present

## 2020-03-19 DIAGNOSIS — M961 Postlaminectomy syndrome, not elsewhere classified: Secondary | ICD-10-CM | POA: Diagnosis not present

## 2020-03-19 DIAGNOSIS — Z79899 Other long term (current) drug therapy: Secondary | ICD-10-CM | POA: Diagnosis not present

## 2020-03-19 DIAGNOSIS — Z79891 Long term (current) use of opiate analgesic: Secondary | ICD-10-CM | POA: Diagnosis not present

## 2020-03-19 DIAGNOSIS — G5600 Carpal tunnel syndrome, unspecified upper limb: Secondary | ICD-10-CM | POA: Diagnosis not present

## 2020-03-19 DIAGNOSIS — M79605 Pain in left leg: Secondary | ICD-10-CM | POA: Diagnosis not present

## 2020-03-19 DIAGNOSIS — G894 Chronic pain syndrome: Secondary | ICD-10-CM | POA: Diagnosis not present

## 2020-03-28 DIAGNOSIS — Z681 Body mass index (BMI) 19 or less, adult: Secondary | ICD-10-CM | POA: Diagnosis not present

## 2020-03-28 DIAGNOSIS — Z20828 Contact with and (suspected) exposure to other viral communicable diseases: Secondary | ICD-10-CM | POA: Diagnosis not present

## 2020-03-28 DIAGNOSIS — R197 Diarrhea, unspecified: Secondary | ICD-10-CM | POA: Diagnosis not present

## 2020-03-28 DIAGNOSIS — Z8719 Personal history of other diseases of the digestive system: Secondary | ICD-10-CM | POA: Diagnosis not present

## 2020-03-28 DIAGNOSIS — R002 Palpitations: Secondary | ICD-10-CM | POA: Diagnosis not present

## 2020-04-05 DIAGNOSIS — R3914 Feeling of incomplete bladder emptying: Secondary | ICD-10-CM | POA: Diagnosis not present

## 2020-04-13 DIAGNOSIS — I872 Venous insufficiency (chronic) (peripheral): Secondary | ICD-10-CM | POA: Diagnosis not present

## 2020-04-13 DIAGNOSIS — I10 Essential (primary) hypertension: Secondary | ICD-10-CM | POA: Diagnosis not present

## 2020-04-13 DIAGNOSIS — Z87891 Personal history of nicotine dependence: Secondary | ICD-10-CM | POA: Diagnosis not present

## 2020-04-13 DIAGNOSIS — J441 Chronic obstructive pulmonary disease with (acute) exacerbation: Secondary | ICD-10-CM | POA: Diagnosis not present

## 2020-04-16 DIAGNOSIS — M79605 Pain in left leg: Secondary | ICD-10-CM | POA: Diagnosis not present

## 2020-04-16 DIAGNOSIS — M961 Postlaminectomy syndrome, not elsewhere classified: Secondary | ICD-10-CM | POA: Diagnosis not present

## 2020-04-16 DIAGNOSIS — G894 Chronic pain syndrome: Secondary | ICD-10-CM | POA: Diagnosis not present

## 2020-04-16 DIAGNOSIS — M25559 Pain in unspecified hip: Secondary | ICD-10-CM | POA: Diagnosis not present

## 2020-04-18 DIAGNOSIS — E785 Hyperlipidemia, unspecified: Secondary | ICD-10-CM | POA: Diagnosis not present

## 2020-04-18 DIAGNOSIS — I350 Nonrheumatic aortic (valve) stenosis: Secondary | ICD-10-CM | POA: Diagnosis not present

## 2020-04-18 DIAGNOSIS — I2510 Atherosclerotic heart disease of native coronary artery without angina pectoris: Secondary | ICD-10-CM | POA: Insufficient documentation

## 2020-04-18 DIAGNOSIS — I10 Essential (primary) hypertension: Secondary | ICD-10-CM | POA: Insufficient documentation

## 2020-04-18 DIAGNOSIS — R002 Palpitations: Secondary | ICD-10-CM | POA: Diagnosis not present

## 2020-05-11 DIAGNOSIS — I350 Nonrheumatic aortic (valve) stenosis: Secondary | ICD-10-CM | POA: Diagnosis not present

## 2020-05-11 DIAGNOSIS — N319 Neuromuscular dysfunction of bladder, unspecified: Secondary | ICD-10-CM | POA: Diagnosis not present

## 2020-05-11 DIAGNOSIS — Z87891 Personal history of nicotine dependence: Secondary | ICD-10-CM | POA: Diagnosis not present

## 2020-05-11 DIAGNOSIS — I2510 Atherosclerotic heart disease of native coronary artery without angina pectoris: Secondary | ICD-10-CM | POA: Diagnosis not present

## 2020-05-11 DIAGNOSIS — E039 Hypothyroidism, unspecified: Secondary | ICD-10-CM | POA: Diagnosis not present

## 2020-05-11 DIAGNOSIS — Z955 Presence of coronary angioplasty implant and graft: Secondary | ICD-10-CM | POA: Diagnosis not present

## 2020-05-11 DIAGNOSIS — I10 Essential (primary) hypertension: Secondary | ICD-10-CM | POA: Diagnosis not present

## 2020-05-11 DIAGNOSIS — E7800 Pure hypercholesterolemia, unspecified: Secondary | ICD-10-CM | POA: Diagnosis not present

## 2020-05-11 DIAGNOSIS — I252 Old myocardial infarction: Secondary | ICD-10-CM | POA: Diagnosis not present

## 2020-05-11 DIAGNOSIS — T83091A Other mechanical complication of indwelling urethral catheter, initial encounter: Secondary | ICD-10-CM | POA: Diagnosis not present

## 2020-05-11 DIAGNOSIS — J449 Chronic obstructive pulmonary disease, unspecified: Secondary | ICD-10-CM | POA: Diagnosis not present

## 2020-05-11 DIAGNOSIS — T83011A Breakdown (mechanical) of indwelling urethral catheter, initial encounter: Secondary | ICD-10-CM | POA: Diagnosis not present

## 2020-05-14 DIAGNOSIS — Z87891 Personal history of nicotine dependence: Secondary | ICD-10-CM | POA: Diagnosis not present

## 2020-05-14 DIAGNOSIS — R002 Palpitations: Secondary | ICD-10-CM | POA: Diagnosis not present

## 2020-05-14 DIAGNOSIS — J441 Chronic obstructive pulmonary disease with (acute) exacerbation: Secondary | ICD-10-CM | POA: Diagnosis not present

## 2020-05-14 DIAGNOSIS — I872 Venous insufficiency (chronic) (peripheral): Secondary | ICD-10-CM | POA: Diagnosis not present

## 2020-05-14 DIAGNOSIS — I10 Essential (primary) hypertension: Secondary | ICD-10-CM | POA: Diagnosis not present

## 2020-05-15 DIAGNOSIS — Z23 Encounter for immunization: Secondary | ICD-10-CM | POA: Diagnosis not present

## 2020-05-16 DIAGNOSIS — G894 Chronic pain syndrome: Secondary | ICD-10-CM | POA: Diagnosis not present

## 2020-05-16 DIAGNOSIS — M961 Postlaminectomy syndrome, not elsewhere classified: Secondary | ICD-10-CM | POA: Diagnosis not present

## 2020-05-16 DIAGNOSIS — M25559 Pain in unspecified hip: Secondary | ICD-10-CM | POA: Diagnosis not present

## 2020-05-16 DIAGNOSIS — G5613 Other lesions of median nerve, bilateral upper limbs: Secondary | ICD-10-CM | POA: Diagnosis not present

## 2020-05-20 DIAGNOSIS — I441 Atrioventricular block, second degree: Secondary | ICD-10-CM | POA: Diagnosis not present

## 2020-05-20 DIAGNOSIS — I471 Supraventricular tachycardia: Secondary | ICD-10-CM | POA: Diagnosis not present

## 2020-05-27 DIAGNOSIS — R3914 Feeling of incomplete bladder emptying: Secondary | ICD-10-CM | POA: Diagnosis not present

## 2020-06-14 DIAGNOSIS — I872 Venous insufficiency (chronic) (peripheral): Secondary | ICD-10-CM | POA: Diagnosis not present

## 2020-06-14 DIAGNOSIS — I10 Essential (primary) hypertension: Secondary | ICD-10-CM | POA: Diagnosis not present

## 2020-06-14 DIAGNOSIS — J441 Chronic obstructive pulmonary disease with (acute) exacerbation: Secondary | ICD-10-CM | POA: Diagnosis not present

## 2020-06-14 DIAGNOSIS — Z87891 Personal history of nicotine dependence: Secondary | ICD-10-CM | POA: Diagnosis not present

## 2020-06-17 DIAGNOSIS — Z79899 Other long term (current) drug therapy: Secondary | ICD-10-CM | POA: Diagnosis not present

## 2020-06-17 DIAGNOSIS — T83511A Infection and inflammatory reaction due to indwelling urethral catheter, initial encounter: Secondary | ICD-10-CM | POA: Diagnosis not present

## 2020-06-17 DIAGNOSIS — J449 Chronic obstructive pulmonary disease, unspecified: Secondary | ICD-10-CM | POA: Diagnosis not present

## 2020-06-17 DIAGNOSIS — I10 Essential (primary) hypertension: Secondary | ICD-10-CM | POA: Diagnosis not present

## 2020-06-17 DIAGNOSIS — Z7982 Long term (current) use of aspirin: Secondary | ICD-10-CM | POA: Diagnosis not present

## 2020-06-17 DIAGNOSIS — K219 Gastro-esophageal reflux disease without esophagitis: Secondary | ICD-10-CM | POA: Diagnosis not present

## 2020-06-17 DIAGNOSIS — Z466 Encounter for fitting and adjustment of urinary device: Secondary | ICD-10-CM | POA: Diagnosis not present

## 2020-06-17 DIAGNOSIS — I2510 Atherosclerotic heart disease of native coronary artery without angina pectoris: Secondary | ICD-10-CM | POA: Diagnosis not present

## 2020-06-17 DIAGNOSIS — N3289 Other specified disorders of bladder: Secondary | ICD-10-CM | POA: Diagnosis not present

## 2020-06-17 DIAGNOSIS — N390 Urinary tract infection, site not specified: Secondary | ICD-10-CM | POA: Diagnosis not present

## 2020-06-17 DIAGNOSIS — Z881 Allergy status to other antibiotic agents status: Secondary | ICD-10-CM | POA: Diagnosis not present

## 2020-06-25 DIAGNOSIS — Z466 Encounter for fitting and adjustment of urinary device: Secondary | ICD-10-CM | POA: Diagnosis not present

## 2020-06-25 DIAGNOSIS — N323 Diverticulum of bladder: Secondary | ICD-10-CM | POA: Diagnosis not present

## 2020-06-25 DIAGNOSIS — R339 Retention of urine, unspecified: Secondary | ICD-10-CM | POA: Diagnosis not present

## 2020-06-25 DIAGNOSIS — N320 Bladder-neck obstruction: Secondary | ICD-10-CM | POA: Diagnosis not present

## 2020-06-25 DIAGNOSIS — M810 Age-related osteoporosis without current pathological fracture: Secondary | ICD-10-CM | POA: Diagnosis not present

## 2020-06-25 DIAGNOSIS — M5416 Radiculopathy, lumbar region: Secondary | ICD-10-CM | POA: Diagnosis not present

## 2020-06-25 DIAGNOSIS — I252 Old myocardial infarction: Secondary | ICD-10-CM | POA: Diagnosis not present

## 2020-06-28 DIAGNOSIS — M5416 Radiculopathy, lumbar region: Secondary | ICD-10-CM | POA: Diagnosis not present

## 2020-06-28 DIAGNOSIS — N323 Diverticulum of bladder: Secondary | ICD-10-CM | POA: Diagnosis not present

## 2020-06-28 DIAGNOSIS — N320 Bladder-neck obstruction: Secondary | ICD-10-CM | POA: Diagnosis not present

## 2020-06-28 DIAGNOSIS — Z466 Encounter for fitting and adjustment of urinary device: Secondary | ICD-10-CM | POA: Diagnosis not present

## 2020-06-28 DIAGNOSIS — M810 Age-related osteoporosis without current pathological fracture: Secondary | ICD-10-CM | POA: Diagnosis not present

## 2020-06-28 DIAGNOSIS — R339 Retention of urine, unspecified: Secondary | ICD-10-CM | POA: Diagnosis not present

## 2020-07-08 DIAGNOSIS — E039 Hypothyroidism, unspecified: Secondary | ICD-10-CM | POA: Diagnosis not present

## 2020-07-08 DIAGNOSIS — E7849 Other hyperlipidemia: Secondary | ICD-10-CM | POA: Diagnosis not present

## 2020-07-08 DIAGNOSIS — Z0001 Encounter for general adult medical examination with abnormal findings: Secondary | ICD-10-CM | POA: Diagnosis not present

## 2020-07-08 DIAGNOSIS — J449 Chronic obstructive pulmonary disease, unspecified: Secondary | ICD-10-CM | POA: Diagnosis not present

## 2020-07-08 DIAGNOSIS — I10 Essential (primary) hypertension: Secondary | ICD-10-CM | POA: Diagnosis not present

## 2020-07-09 DIAGNOSIS — E7849 Other hyperlipidemia: Secondary | ICD-10-CM | POA: Diagnosis not present

## 2020-07-09 DIAGNOSIS — J449 Chronic obstructive pulmonary disease, unspecified: Secondary | ICD-10-CM | POA: Diagnosis not present

## 2020-07-09 DIAGNOSIS — G4700 Insomnia, unspecified: Secondary | ICD-10-CM | POA: Diagnosis not present

## 2020-07-09 DIAGNOSIS — R339 Retention of urine, unspecified: Secondary | ICD-10-CM | POA: Diagnosis not present

## 2020-07-09 DIAGNOSIS — R3914 Feeling of incomplete bladder emptying: Secondary | ICD-10-CM | POA: Diagnosis not present

## 2020-07-09 DIAGNOSIS — R609 Edema, unspecified: Secondary | ICD-10-CM | POA: Diagnosis not present

## 2020-07-09 DIAGNOSIS — M545 Low back pain, unspecified: Secondary | ICD-10-CM | POA: Diagnosis not present

## 2020-07-09 DIAGNOSIS — I10 Essential (primary) hypertension: Secondary | ICD-10-CM | POA: Diagnosis not present

## 2020-07-09 DIAGNOSIS — E039 Hypothyroidism, unspecified: Secondary | ICD-10-CM | POA: Diagnosis not present

## 2020-07-09 DIAGNOSIS — I358 Other nonrheumatic aortic valve disorders: Secondary | ICD-10-CM | POA: Diagnosis not present

## 2020-07-18 DIAGNOSIS — Z79891 Long term (current) use of opiate analgesic: Secondary | ICD-10-CM | POA: Diagnosis not present

## 2020-07-18 DIAGNOSIS — M961 Postlaminectomy syndrome, not elsewhere classified: Secondary | ICD-10-CM | POA: Diagnosis not present

## 2020-07-18 DIAGNOSIS — G5600 Carpal tunnel syndrome, unspecified upper limb: Secondary | ICD-10-CM | POA: Diagnosis not present

## 2020-07-18 DIAGNOSIS — G894 Chronic pain syndrome: Secondary | ICD-10-CM | POA: Diagnosis not present

## 2020-07-18 DIAGNOSIS — M5459 Other low back pain: Secondary | ICD-10-CM | POA: Diagnosis not present

## 2020-07-18 DIAGNOSIS — Z79899 Other long term (current) drug therapy: Secondary | ICD-10-CM | POA: Diagnosis not present

## 2020-07-25 DIAGNOSIS — I252 Old myocardial infarction: Secondary | ICD-10-CM | POA: Diagnosis not present

## 2020-07-25 DIAGNOSIS — M5416 Radiculopathy, lumbar region: Secondary | ICD-10-CM | POA: Diagnosis not present

## 2020-07-25 DIAGNOSIS — R339 Retention of urine, unspecified: Secondary | ICD-10-CM | POA: Diagnosis not present

## 2020-07-25 DIAGNOSIS — N323 Diverticulum of bladder: Secondary | ICD-10-CM | POA: Diagnosis not present

## 2020-07-25 DIAGNOSIS — M810 Age-related osteoporosis without current pathological fracture: Secondary | ICD-10-CM | POA: Diagnosis not present

## 2020-07-25 DIAGNOSIS — N320 Bladder-neck obstruction: Secondary | ICD-10-CM | POA: Diagnosis not present

## 2020-07-25 DIAGNOSIS — Z466 Encounter for fitting and adjustment of urinary device: Secondary | ICD-10-CM | POA: Diagnosis not present

## 2020-08-01 ENCOUNTER — Ambulatory Visit: Payer: Medicare Other | Admitting: Family Medicine

## 2020-08-05 DIAGNOSIS — R339 Retention of urine, unspecified: Secondary | ICD-10-CM | POA: Diagnosis not present

## 2020-08-05 DIAGNOSIS — Z466 Encounter for fitting and adjustment of urinary device: Secondary | ICD-10-CM | POA: Diagnosis not present

## 2020-08-05 DIAGNOSIS — M810 Age-related osteoporosis without current pathological fracture: Secondary | ICD-10-CM | POA: Diagnosis not present

## 2020-08-05 DIAGNOSIS — N320 Bladder-neck obstruction: Secondary | ICD-10-CM | POA: Diagnosis not present

## 2020-08-05 DIAGNOSIS — N323 Diverticulum of bladder: Secondary | ICD-10-CM | POA: Diagnosis not present

## 2020-08-05 DIAGNOSIS — M5416 Radiculopathy, lumbar region: Secondary | ICD-10-CM | POA: Diagnosis not present

## 2020-08-07 DIAGNOSIS — I350 Nonrheumatic aortic (valve) stenosis: Secondary | ICD-10-CM | POA: Diagnosis not present

## 2020-08-07 DIAGNOSIS — I083 Combined rheumatic disorders of mitral, aortic and tricuspid valves: Secondary | ICD-10-CM | POA: Diagnosis not present

## 2020-08-11 NOTE — Progress Notes (Deleted)
Cardiology Office Note  Date: 08/11/2020   ID: Chelsye, Suhre [birth year], MRN [id number]  PCP:  Royann Shivers, PA-C  Cardiologist:  Prentice Docker, MD (Inactive) Electrophysiologist:  None   Chief Complaint:   History of Present Illness: Mckenzie [name] is a 85 y.o. female with a history of CAD, HTN, aortic stenosis, palpitations, Carotid artery disease.  Recent office visit with Dr. Sharrell Ku Cardiology [facility] healthcare 04/18/2020.   No angina. No nitrate or BB use. EKG NSR RBBB Palpitations. heart racing from time to time. Zio monitor was ordered. BP acceptable control. Continue ACEI. Tolerating statin therapy. Echo ordered   Past Medical History:  Diagnosis Date  . Aortic valve sclerosis    Mild, echo, April, 2012  . ARDS (adult respiratory distress syndrome) (HCC) 2003   with tracheostomy-peritonitis  . Arthritis   . Back pain    Need for surgery, Dr. Trey Sailors  . CAD (coronary artery disease)    Stent to LAD, 2003 [facility] / nuclear scan April, 201 to artifact / persistent shortness of breath / catheterization Oct 21, 2010... widely patent stent to the mid LAD, no significant obstructive disease, should be low risk    for back surgery  . Carotid arterial disease (HCC)    Moderate-Dr. Myra Gianotti following  . Chronic back pain   . COPD (chronic obstructive pulmonary disease) (HCC)   . Diastolic dysfunction    Echo, April, 2011   EF 65%  . Dysrhythmia    afib  . Ejection fraction    EF 65%, echo, September 15, 2010  . GERD (gastroesophageal reflux disease)   . Hemolytic anemia (HCC)    Followed by Dr. Derald Macleod  . Hypertension   . Hypothyroidism   . Left ventricular hypertrophy    EF 65%, echo, April, 2011  . MR (mitral regurgitation)     Mild echo, 2012 ///  Mild per Echo 2009,  mild, echo, April, 2011  . Myocardial infarction (HCC)   . Palpitations    Holter, September 16, 2010, PACs and PVCs,one 4 beat run of SVT  . Pneumonia 11-2011  . Preoperative clearance     Preop clearance for back surgery  . Shortness of breath    March, 2012  . UTI (urinary tract infection)    Chronic-on Macrodantin    Past Surgical History:  Procedure Laterality Date  . APPENDECTOMY    . BACK SURGERY    . BREAST LUMPECTOMY Left   . CATARACT EXTRACTION W/PHACO Right 12/05/2012   Procedure: CATARACT EXTRACTION PHACO AND INTRAOCULAR LENS PLACEMENT (IOC);  Surgeon: Gemma Payor, MD;  Location: [facility];  Service: Ophthalmology;  Laterality: Right;  CDE:15.30  . CATARACT EXTRACTION W/PHACO Left 12/15/2012   Procedure: CATARACT EXTRACTION PHACO AND INTRAOCULAR LENS PLACEMENT (IOC);  Surgeon: Gemma Payor, MD;  Location: [facility];  Service: Ophthalmology;  Laterality: Left;  CDE:22.56  . CORONARY ANGIOPLASTY WITH STENT PLACEMENT  2003  . repair of ruptured lumbar discs  12-2011  . SPINE SURGERY     lumbar disk removal by Dr. Laurian Brim at [facility]  . THYROID SURGERY  08/2009   Benign adenomas  . TONSILLECTOMY    . TRACHEOSTOMY  2003   Following ARDS     Current Outpatient Medications  Medication Sig Dispense Refill  . aspirin 81 MG tablet Take 81 mg by mouth daily.    Marland Kitchen atorvastatin (LIPITOR) 10 MG tablet Take 10 mg by mouth daily.     . benazepril (LOTENSIN) 5  MG tablet Take 5 mg by mouth daily.     . Calcium Carbonate (CALCIUM 600 PO) Take 1,200 mg by mouth daily.    . Cholecalciferol (VITAMIN D3) 1000 UNITS CAPS Take 1,000 Units by mouth daily. Take one tab daily    . diphenhydrAMINE (BENADRYL) 25 mg capsule Take 25 mg by mouth daily as needed for sleep.     Marland Kitchen esomeprazole (NEXIUM) 40 MG capsule Take 40 mg by mouth daily before breakfast.     . Ferrous Gluconate (IRON) 240 (27 FE) MG TABS Take 1 tablet by mouth daily.    Marland Kitchen HYDROcodone-acetaminophen (NORCO) 10-325 MG tablet Take 1 tablet by mouth every 4 (four) hours.    . hydrocortisone (ANUSOL-HC) 2.5 % rectal cream as needed.    . hyoscyamine (LEVSIN SL) 0.125 MG SL tablet Take 0.125 mg by mouth every 6 (six) hours  as needed for cramping or diarrhea or loose stools.     Marland Kitchen levothyroxine (SYNTHROID, LEVOTHROID) 75 MCG tablet Take 75 mcg by mouth daily before breakfast.     . Melatonin 10 MG TABS Take 1 tablet by mouth at bedtime.    . metoprolol tartrate (LOPRESSOR) 25 MG tablet TAKE (1) TABLET TWICE DAILY. 60 tablet 0  . Multiple Vitamin (MULTIVITAMIN) tablet Take 1 tablet by mouth daily.     . nitroGLYCERIN (NITROSTAT) 0.4 MG SL tablet Place one tablet under tongue every 5 minutes up to 3 doses as needed for chest pain. No more than 3 doses over a 15 minute period. 100 tablet 0  . nystatin cream (MYCOSTATIN) as needed.    . polyethylene glycol (MIRALAX / GLYCOLAX) 17 g packet Take 17 g by mouth every morning.    Marland Kitchen PREMARIN vaginal cream as needed.    . raloxifene (EVISTA) 60 MG tablet Take 60 mg by mouth daily.    . tamsulosin (FLOMAX) 0.4 MG CAPS capsule Take 0.4 mg by mouth daily.    . temazepam (RESTORIL) 30 MG capsule Take 30 mg by mouth at bedtime.     . traZODone (DESYREL) 50 MG tablet     . tretinoin (RETIN-A) 0.05 % cream Apply 1 application topically at bedtime.      No current facility-administered medications for this visit.   Allergies:  Tetracycline   Social History: The patient  reports that she quit smoking about 20 years ago. Her smoking use included cigarettes. She has a 20.00 pack-year smoking history. She has never used smokeless tobacco. She reports that she does not drink alcohol and does not use drugs.   Family History: The patient's family history includes Diabetes in her son; Heart failure in her father.   ROS:  Please see the history of present illness. Otherwise, complete review of systems is positive for none.  All other systems are reviewed and negative.   Physical Exam: VS:  There were no vitals taken for this visit., BMI There is no height or weight on file to calculate BMI.  Wt Readings from Last 3 Encounters:  10/04/19 101 lb 9.6 oz (46.1 kg)  08/22/19 100 lb (45.4  kg)  07/31/19 99 lb (44.9 kg)    General: Patient appears comfortable at rest. HEENT: Conjunctiva and lids normal, oropharynx clear with moist mucosa. Neck: Supple, no elevated JVP or carotid bruits, no thyromegaly. Lungs: Clear to auscultation, nonlabored breathing at rest. Cardiac: Regular rate and rhythm, no S3 or significant systolic murmur, no pericardial rub. Abdomen: Soft, nontender, no hepatomegaly, bowel sounds present, no guarding or  rebound. Extremities: No pitting edema, distal pulses 2+. Skin: Warm and dry. Musculoskeletal: No kyphosis. Neuropsychiatric: Alert and oriented x3, affect grossly appropriate.  ECG:  {EKG/Telemetry Strips [phone]}  Recent Labwork: 09/13/2019: BUN 23; Creatinine, Ser 0.69; Potassium 4.1; Sodium 138  No results found for: CHOL, TRIG, HDL, CHOLHDL, VLDL, LDLCALC, LDLDIRECT  Other Studies Reviewed Today:   08/07/2020  ECHOCARDIOGRAM W COLORFLOW SPECTRAL DOPPLER Study Info Indications   - Aortic Stenosis  Complete two-dimensional, color flow and Doppler transthoracic echocardiogram is performed.  Staff Referring Physician:   Hubert Azure, DO; Sonographer:   Collene Mares RDCS Ordering Physician:   Hubert Azure  [phone])  Account #:   [id number]   Summary 1. The left ventricle is normal in size with mildly increased wall thickness. 2. The left ventricular systolic function is normal, LVEF is visually estimated at 65-70%. 3. There is grade I diastolic dysfunction (impaired relaxation). 4. There is mild mitral valve regurgitation. 5. There is mild aortic valve stenosis. 6. The left atrium is mildly to moderately dilated in size. 7. The right ventricle is normal in size, with normal systolic function.   Assessment and Plan:  1. CAD in native artery   2. Palpitations   3. Essential hypertension   4. Mixed hyperlipidemia   5. Aortic valve stenosis, etiology of cardiac valve disease unspecified       Medication Adjustments/Labs and Tests Ordered: Current medicines are reviewed at length with the patient today.  Concerns regarding medicines are outlined above.   Disposition: Follow-up with   Signed, Rennis Harding, NP 08/11/2020 10:42 PM    [facility] at [facility] [address] [place], [place], [place] [address] Phone: [phone]; Fax: [phone]

## 2020-08-12 ENCOUNTER — Ambulatory Visit: Payer: Medicare Other | Admitting: Family Medicine

## 2020-08-12 DIAGNOSIS — I350 Nonrheumatic aortic (valve) stenosis: Secondary | ICD-10-CM

## 2020-08-12 DIAGNOSIS — E782 Mixed hyperlipidemia: Secondary | ICD-10-CM

## 2020-08-12 DIAGNOSIS — R002 Palpitations: Secondary | ICD-10-CM

## 2020-08-12 DIAGNOSIS — I10 Essential (primary) hypertension: Secondary | ICD-10-CM

## 2020-08-12 DIAGNOSIS — I2510 Atherosclerotic heart disease of native coronary artery without angina pectoris: Secondary | ICD-10-CM

## 2020-08-13 ENCOUNTER — Encounter: Payer: Self-pay | Admitting: Family Medicine

## 2020-08-15 DIAGNOSIS — G894 Chronic pain syndrome: Secondary | ICD-10-CM | POA: Diagnosis not present

## 2020-08-15 DIAGNOSIS — M961 Postlaminectomy syndrome, not elsewhere classified: Secondary | ICD-10-CM | POA: Diagnosis not present

## 2020-08-15 DIAGNOSIS — M25559 Pain in unspecified hip: Secondary | ICD-10-CM | POA: Diagnosis not present

## 2020-08-15 DIAGNOSIS — M5459 Other low back pain: Secondary | ICD-10-CM | POA: Diagnosis not present

## 2020-08-20 DIAGNOSIS — N320 Bladder-neck obstruction: Secondary | ICD-10-CM | POA: Diagnosis not present

## 2020-08-20 DIAGNOSIS — Z466 Encounter for fitting and adjustment of urinary device: Secondary | ICD-10-CM | POA: Diagnosis not present

## 2020-08-20 DIAGNOSIS — M810 Age-related osteoporosis without current pathological fracture: Secondary | ICD-10-CM | POA: Diagnosis not present

## 2020-08-20 DIAGNOSIS — R339 Retention of urine, unspecified: Secondary | ICD-10-CM | POA: Diagnosis not present

## 2020-08-20 DIAGNOSIS — N323 Diverticulum of bladder: Secondary | ICD-10-CM | POA: Diagnosis not present

## 2020-08-20 DIAGNOSIS — M5416 Radiculopathy, lumbar region: Secondary | ICD-10-CM | POA: Diagnosis not present

## 2020-08-24 DIAGNOSIS — I252 Old myocardial infarction: Secondary | ICD-10-CM | POA: Diagnosis not present

## 2020-08-24 DIAGNOSIS — M810 Age-related osteoporosis without current pathological fracture: Secondary | ICD-10-CM | POA: Diagnosis not present

## 2020-08-24 DIAGNOSIS — N323 Diverticulum of bladder: Secondary | ICD-10-CM | POA: Diagnosis not present

## 2020-08-24 DIAGNOSIS — R339 Retention of urine, unspecified: Secondary | ICD-10-CM | POA: Diagnosis not present

## 2020-08-24 DIAGNOSIS — Z466 Encounter for fitting and adjustment of urinary device: Secondary | ICD-10-CM | POA: Diagnosis not present

## 2020-08-24 DIAGNOSIS — N320 Bladder-neck obstruction: Secondary | ICD-10-CM | POA: Diagnosis not present

## 2020-08-24 DIAGNOSIS — M5416 Radiculopathy, lumbar region: Secondary | ICD-10-CM | POA: Diagnosis not present

## 2020-09-03 DIAGNOSIS — R339 Retention of urine, unspecified: Secondary | ICD-10-CM | POA: Diagnosis not present

## 2020-09-03 DIAGNOSIS — N320 Bladder-neck obstruction: Secondary | ICD-10-CM | POA: Diagnosis not present

## 2020-09-03 DIAGNOSIS — M810 Age-related osteoporosis without current pathological fracture: Secondary | ICD-10-CM | POA: Diagnosis not present

## 2020-09-03 DIAGNOSIS — Z466 Encounter for fitting and adjustment of urinary device: Secondary | ICD-10-CM | POA: Diagnosis not present

## 2020-09-03 DIAGNOSIS — N323 Diverticulum of bladder: Secondary | ICD-10-CM | POA: Diagnosis not present

## 2020-09-03 DIAGNOSIS — M5416 Radiculopathy, lumbar region: Secondary | ICD-10-CM | POA: Diagnosis not present

## 2020-09-10 DIAGNOSIS — H524 Presbyopia: Secondary | ICD-10-CM | POA: Diagnosis not present

## 2020-09-10 DIAGNOSIS — Z961 Presence of intraocular lens: Secondary | ICD-10-CM | POA: Diagnosis not present

## 2020-09-11 DIAGNOSIS — I10 Essential (primary) hypertension: Secondary | ICD-10-CM | POA: Diagnosis not present

## 2020-09-11 DIAGNOSIS — J441 Chronic obstructive pulmonary disease with (acute) exacerbation: Secondary | ICD-10-CM | POA: Diagnosis not present

## 2020-09-11 DIAGNOSIS — I872 Venous insufficiency (chronic) (peripheral): Secondary | ICD-10-CM | POA: Diagnosis not present

## 2020-09-11 DIAGNOSIS — Z87891 Personal history of nicotine dependence: Secondary | ICD-10-CM | POA: Diagnosis not present

## 2020-09-12 DIAGNOSIS — M5030 Other cervical disc degeneration, unspecified cervical region: Secondary | ICD-10-CM | POA: Diagnosis not present

## 2020-09-12 DIAGNOSIS — M961 Postlaminectomy syndrome, not elsewhere classified: Secondary | ICD-10-CM | POA: Diagnosis not present

## 2020-09-12 DIAGNOSIS — G894 Chronic pain syndrome: Secondary | ICD-10-CM | POA: Diagnosis not present

## 2020-09-12 DIAGNOSIS — M5459 Other low back pain: Secondary | ICD-10-CM | POA: Diagnosis not present

## 2020-09-20 DIAGNOSIS — J441 Chronic obstructive pulmonary disease with (acute) exacerbation: Secondary | ICD-10-CM | POA: Diagnosis not present

## 2020-09-20 DIAGNOSIS — Z20828 Contact with and (suspected) exposure to other viral communicable diseases: Secondary | ICD-10-CM | POA: Diagnosis not present

## 2020-09-23 DIAGNOSIS — R339 Retention of urine, unspecified: Secondary | ICD-10-CM | POA: Diagnosis not present

## 2020-09-23 DIAGNOSIS — N323 Diverticulum of bladder: Secondary | ICD-10-CM | POA: Diagnosis not present

## 2020-09-23 DIAGNOSIS — N320 Bladder-neck obstruction: Secondary | ICD-10-CM | POA: Diagnosis not present

## 2020-09-23 DIAGNOSIS — I252 Old myocardial infarction: Secondary | ICD-10-CM | POA: Diagnosis not present

## 2020-09-23 DIAGNOSIS — M5416 Radiculopathy, lumbar region: Secondary | ICD-10-CM | POA: Diagnosis not present

## 2020-09-23 DIAGNOSIS — M810 Age-related osteoporosis without current pathological fracture: Secondary | ICD-10-CM | POA: Diagnosis not present

## 2020-09-23 DIAGNOSIS — Z466 Encounter for fitting and adjustment of urinary device: Secondary | ICD-10-CM | POA: Diagnosis not present

## 2020-09-26 DIAGNOSIS — Z681 Body mass index (BMI) 19 or less, adult: Secondary | ICD-10-CM | POA: Diagnosis not present

## 2020-09-26 DIAGNOSIS — L578 Other skin changes due to chronic exposure to nonionizing radiation: Secondary | ICD-10-CM | POA: Diagnosis not present

## 2020-09-26 DIAGNOSIS — T148XXA Other injury of unspecified body region, initial encounter: Secondary | ICD-10-CM | POA: Diagnosis not present

## 2020-09-26 DIAGNOSIS — R21 Rash and other nonspecific skin eruption: Secondary | ICD-10-CM | POA: Diagnosis not present

## 2020-09-26 DIAGNOSIS — K6289 Other specified diseases of anus and rectum: Secondary | ICD-10-CM | POA: Diagnosis not present

## 2020-09-27 ENCOUNTER — Inpatient Hospital Stay (HOSPITAL_COMMUNITY): Payer: Medicare Other

## 2020-09-27 ENCOUNTER — Encounter (HOSPITAL_COMMUNITY)
Admission: EM | Disposition: A | Discharge status: Rehabilitation Facility | Payer: Self-pay | Source: Home / Self Care | Attending: Internal Medicine

## 2020-09-27 ENCOUNTER — Emergency Department (HOSPITAL_COMMUNITY): Payer: Medicare Other

## 2020-09-27 ENCOUNTER — Other Ambulatory Visit: Payer: Self-pay

## 2020-09-27 ENCOUNTER — Inpatient Hospital Stay (HOSPITAL_COMMUNITY)
Admission: EM | Admit: 2020-09-27 | Discharge: 2020-10-03 | DRG: 481 | Disposition: A | Discharge status: Rehabilitation Facility | Payer: Medicare Other | Attending: Internal Medicine | Admitting: Internal Medicine

## 2020-09-27 ENCOUNTER — Inpatient Hospital Stay (HOSPITAL_COMMUNITY): Payer: Medicare Other | Admitting: Anesthesiology

## 2020-09-27 ENCOUNTER — Encounter (HOSPITAL_COMMUNITY): Payer: Self-pay

## 2020-09-27 DIAGNOSIS — E876 Hypokalemia: Secondary | ICD-10-CM | POA: Diagnosis present

## 2020-09-27 DIAGNOSIS — Z833 Family history of diabetes mellitus: Secondary | ICD-10-CM | POA: Diagnosis not present

## 2020-09-27 DIAGNOSIS — Z79890 Hormone replacement therapy (postmenopausal): Secondary | ICD-10-CM

## 2020-09-27 DIAGNOSIS — R58 Hemorrhage, not elsewhere classified: Secondary | ICD-10-CM | POA: Diagnosis present

## 2020-09-27 DIAGNOSIS — K219 Gastro-esophageal reflux disease without esophagitis: Secondary | ICD-10-CM | POA: Diagnosis not present

## 2020-09-27 DIAGNOSIS — S72002D Fracture of unspecified part of neck of left femur, subsequent encounter for closed fracture with routine healing: Secondary | ICD-10-CM | POA: Diagnosis not present

## 2020-09-27 DIAGNOSIS — K581 Irritable bowel syndrome with constipation: Secondary | ICD-10-CM | POA: Diagnosis not present

## 2020-09-27 DIAGNOSIS — F5104 Psychophysiologic insomnia: Secondary | ICD-10-CM | POA: Diagnosis present

## 2020-09-27 DIAGNOSIS — Z955 Presence of coronary angioplasty implant and graft: Secondary | ICD-10-CM | POA: Diagnosis not present

## 2020-09-27 DIAGNOSIS — E890 Postprocedural hypothyroidism: Secondary | ICD-10-CM | POA: Diagnosis not present

## 2020-09-27 DIAGNOSIS — E039 Hypothyroidism, unspecified: Secondary | ICD-10-CM | POA: Diagnosis present

## 2020-09-27 DIAGNOSIS — S72142D Displaced intertrochanteric fracture of left femur, subsequent encounter for closed fracture with routine healing: Secondary | ICD-10-CM | POA: Diagnosis not present

## 2020-09-27 DIAGNOSIS — R339 Retention of urine, unspecified: Secondary | ICD-10-CM | POA: Diagnosis present

## 2020-09-27 DIAGNOSIS — S7222XA Displaced subtrochanteric fracture of left femur, initial encounter for closed fracture: Secondary | ICD-10-CM | POA: Diagnosis not present

## 2020-09-27 DIAGNOSIS — W010XXA Fall on same level from slipping, tripping and stumbling without subsequent striking against object, initial encounter: Secondary | ICD-10-CM | POA: Diagnosis present

## 2020-09-27 DIAGNOSIS — E46 Unspecified protein-calorie malnutrition: Secondary | ICD-10-CM | POA: Diagnosis not present

## 2020-09-27 DIAGNOSIS — Z881 Allergy status to other antibiotic agents status: Secondary | ICD-10-CM | POA: Diagnosis not present

## 2020-09-27 DIAGNOSIS — G8929 Other chronic pain: Secondary | ICD-10-CM | POA: Diagnosis present

## 2020-09-27 DIAGNOSIS — M1990 Unspecified osteoarthritis, unspecified site: Secondary | ICD-10-CM | POA: Diagnosis present

## 2020-09-27 DIAGNOSIS — K589 Irritable bowel syndrome without diarrhea: Secondary | ICD-10-CM | POA: Diagnosis present

## 2020-09-27 DIAGNOSIS — S72009A Fracture of unspecified part of neck of unspecified femur, initial encounter for closed fracture: Secondary | ICD-10-CM | POA: Diagnosis not present

## 2020-09-27 DIAGNOSIS — I25118 Atherosclerotic heart disease of native coronary artery with other forms of angina pectoris: Secondary | ICD-10-CM | POA: Diagnosis not present

## 2020-09-27 DIAGNOSIS — N320 Bladder-neck obstruction: Secondary | ICD-10-CM | POA: Diagnosis present

## 2020-09-27 DIAGNOSIS — Z419 Encounter for procedure for purposes other than remedying health state, unspecified: Secondary | ICD-10-CM

## 2020-09-27 DIAGNOSIS — D62 Acute posthemorrhagic anemia: Secondary | ICD-10-CM | POA: Diagnosis not present

## 2020-09-27 DIAGNOSIS — Z981 Arthrodesis status: Secondary | ICD-10-CM | POA: Diagnosis not present

## 2020-09-27 DIAGNOSIS — M160 Bilateral primary osteoarthritis of hip: Secondary | ICD-10-CM | POA: Diagnosis not present

## 2020-09-27 DIAGNOSIS — S72002A Fracture of unspecified part of neck of left femur, initial encounter for closed fracture: Secondary | ICD-10-CM | POA: Diagnosis not present

## 2020-09-27 DIAGNOSIS — Z960 Presence of urogenital implants: Secondary | ICD-10-CM | POA: Diagnosis present

## 2020-09-27 DIAGNOSIS — I10 Essential (primary) hypertension: Secondary | ICD-10-CM | POA: Diagnosis not present

## 2020-09-27 DIAGNOSIS — M549 Dorsalgia, unspecified: Secondary | ICD-10-CM | POA: Diagnosis present

## 2020-09-27 DIAGNOSIS — I2510 Atherosclerotic heart disease of native coronary artery without angina pectoris: Secondary | ICD-10-CM | POA: Diagnosis present

## 2020-09-27 DIAGNOSIS — Z8781 Personal history of (healed) traumatic fracture: Secondary | ICD-10-CM

## 2020-09-27 DIAGNOSIS — Y92019 Unspecified place in single-family (private) house as the place of occurrence of the external cause: Secondary | ICD-10-CM

## 2020-09-27 DIAGNOSIS — R9431 Abnormal electrocardiogram [ECG] [EKG]: Secondary | ICD-10-CM | POA: Diagnosis not present

## 2020-09-27 DIAGNOSIS — Z8249 Family history of ischemic heart disease and other diseases of the circulatory system: Secondary | ICD-10-CM

## 2020-09-27 DIAGNOSIS — W1830XD Fall on same level, unspecified, subsequent encounter: Secondary | ICD-10-CM | POA: Diagnosis not present

## 2020-09-27 DIAGNOSIS — M795 Residual foreign body in soft tissue: Secondary | ICD-10-CM | POA: Diagnosis not present

## 2020-09-27 DIAGNOSIS — Z20822 Contact with and (suspected) exposure to covid-19: Secondary | ICD-10-CM | POA: Diagnosis present

## 2020-09-27 DIAGNOSIS — I358 Other nonrheumatic aortic valve disorders: Secondary | ICD-10-CM | POA: Diagnosis present

## 2020-09-27 DIAGNOSIS — J449 Chronic obstructive pulmonary disease, unspecified: Secondary | ICD-10-CM | POA: Diagnosis present

## 2020-09-27 DIAGNOSIS — Z87891 Personal history of nicotine dependence: Secondary | ICD-10-CM

## 2020-09-27 DIAGNOSIS — J439 Emphysema, unspecified: Secondary | ICD-10-CM | POA: Diagnosis not present

## 2020-09-27 DIAGNOSIS — I252 Old myocardial infarction: Secondary | ICD-10-CM

## 2020-09-27 DIAGNOSIS — S72002S Fracture of unspecified part of neck of left femur, sequela: Secondary | ICD-10-CM | POA: Diagnosis not present

## 2020-09-27 DIAGNOSIS — W19XXXA Unspecified fall, initial encounter: Secondary | ICD-10-CM

## 2020-09-27 DIAGNOSIS — S72142A Displaced intertrochanteric fracture of left femur, initial encounter for closed fracture: Principal | ICD-10-CM | POA: Diagnosis present

## 2020-09-27 DIAGNOSIS — I499 Cardiac arrhythmia, unspecified: Secondary | ICD-10-CM | POA: Diagnosis not present

## 2020-09-27 DIAGNOSIS — Z043 Encounter for examination and observation following other accident: Secondary | ICD-10-CM | POA: Diagnosis not present

## 2020-09-27 DIAGNOSIS — R7401 Elevation of levels of liver transaminase levels: Secondary | ICD-10-CM | POA: Diagnosis not present

## 2020-09-27 DIAGNOSIS — E8809 Other disorders of plasma-protein metabolism, not elsewhere classified: Secondary | ICD-10-CM | POA: Diagnosis present

## 2020-09-27 DIAGNOSIS — Z79899 Other long term (current) drug therapy: Secondary | ICD-10-CM

## 2020-09-27 DIAGNOSIS — S7292XS Unspecified fracture of left femur, sequela: Secondary | ICD-10-CM | POA: Diagnosis not present

## 2020-09-27 DIAGNOSIS — R0989 Other specified symptoms and signs involving the circulatory and respiratory systems: Secondary | ICD-10-CM | POA: Diagnosis not present

## 2020-09-27 DIAGNOSIS — I959 Hypotension, unspecified: Secondary | ICD-10-CM | POA: Diagnosis not present

## 2020-09-27 DIAGNOSIS — G8918 Other acute postprocedural pain: Secondary | ICD-10-CM | POA: Diagnosis not present

## 2020-09-27 HISTORY — PX: INTRAMEDULLARY (IM) NAIL INTERTROCHANTERIC: SHX5875

## 2020-09-27 LAB — ECHOCARDIOGRAM COMPLETE
AR max vel: 1.82 cm2
AV Area VTI: 2.4 cm2
AV Area mean vel: 2.18 cm2
AV Mean grad: 14.3 mmHg
AV Peak grad: 31.9 mmHg
Ao pk vel: 2.82 m/s
Area-P 1/2: 2.91 cm2
Height: 62 in
Weight: 1648 oz

## 2020-09-27 LAB — CBC WITH DIFFERENTIAL/PLATELET
Abs Immature Granulocytes: 0.05 10*3/uL (ref 0.00–0.07)
Basophils Absolute: 0 10*3/uL (ref 0.0–0.1)
Basophils Relative: 0 %
Eosinophils Absolute: 0 10*3/uL (ref 0.0–0.5)
Eosinophils Relative: 0 %
HCT: 31.7 % — ABNORMAL LOW (ref 36.0–46.0)
Hemoglobin: 10.4 g/dL — ABNORMAL LOW (ref 12.0–15.0)
Immature Granulocytes: 0 %
Lymphocytes Relative: 8 %
Lymphs Abs: 1.1 10*3/uL (ref 0.7–4.0)
MCH: 31 pg (ref 26.0–34.0)
MCHC: 32.8 g/dL (ref 30.0–36.0)
MCV: 94.3 fL (ref 80.0–100.0)
Monocytes Absolute: 1.5 10*3/uL — ABNORMAL HIGH (ref 0.1–1.0)
Monocytes Relative: 11 %
Neutro Abs: 10.6 10*3/uL — ABNORMAL HIGH (ref 1.7–7.7)
Neutrophils Relative %: 81 %
Platelets: 175 10*3/uL (ref 150–400)
RBC: 3.36 MIL/uL — ABNORMAL LOW (ref 3.87–5.11)
RDW: 13.5 % (ref 11.5–15.5)
WBC: 13.3 10*3/uL — ABNORMAL HIGH (ref 4.0–10.5)
nRBC: 0 % (ref 0.0–0.2)

## 2020-09-27 LAB — COMPREHENSIVE METABOLIC PANEL
ALT: 19 U/L (ref 0–44)
AST: 27 U/L (ref 15–41)
Albumin: 3.2 g/dL — ABNORMAL LOW (ref 3.5–5.0)
Alkaline Phosphatase: 45 U/L (ref 38–126)
Anion gap: 9 (ref 5–15)
BUN: 15 mg/dL (ref 8–23)
CO2: 26 mmol/L (ref 22–32)
Calcium: 8.2 mg/dL — ABNORMAL LOW (ref 8.9–10.3)
Chloride: 105 mmol/L (ref 98–111)
Creatinine, Ser: 0.67 mg/dL (ref 0.44–1.00)
GFR, Estimated: >60 mL/min (ref >60)
Glucose, Bld: 115 mg/dL — ABNORMAL HIGH (ref 70–99)
Potassium: 3.4 mmol/L — ABNORMAL LOW (ref 3.5–5.1)
Sodium: 140 mmol/L (ref 135–145)
Total Bilirubin: 0.8 mg/dL (ref 0.3–1.2)
Total Protein: 5.1 g/dL — ABNORMAL LOW (ref 6.5–8.1)

## 2020-09-27 LAB — RESP PANEL BY RT-PCR (FLU A&B, COVID) ARPGX2
Influenza A by PCR: NEGATIVE
Influenza B by PCR: NEGATIVE
SARS Coronavirus 2 by RT PCR: NEGATIVE

## 2020-09-27 SURGERY — FIXATION, FRACTURE, INTERTROCHANTERIC, WITH INTRAMEDULLARY ROD
Anesthesia: General | Site: Hip | Laterality: Left

## 2020-09-27 MED ORDER — TRANEXAMIC ACID-NACL 1000-0.7 MG/100ML-% IV SOLN: 1000.0000 mg | INTRAVENOUS | Status: DC

## 2020-09-27 MED ORDER — CALCIUM CARBONATE-VITAMIN D 500-200 MG-UNIT PO TABS
2.0000 | ORAL_TABLET | Freq: Every day | ORAL | Status: DC
  Administered 2020-09-28 – 2020-10-03 (×6): 2 via ORAL
  Filled 2020-09-27 (×6): qty 2

## 2020-09-27 MED ORDER — TRETINOIN 0.05 % EX CREA: 1.0000 "application " | TOPICAL_CREAM | Freq: Every day | CUTANEOUS | Status: DC

## 2020-09-27 MED ORDER — ALBUMIN HUMAN 5 % IV SOLN: INTRAVENOUS | Status: DC | PRN

## 2020-09-27 MED ORDER — CLONIDINE HCL (ANALGESIA) 100 MCG/ML EP SOLN
EPIDURAL | Status: AC
  Filled 2020-09-27: qty 10

## 2020-09-27 MED ORDER — TRAZODONE HCL 50 MG PO TABS: 50.0000 mg | ORAL_TABLET | Freq: Every evening | ORAL | Status: DC | PRN

## 2020-09-27 MED ORDER — FENTANYL CITRATE (PF) 100 MCG/2ML IJ SOLN: 25.0000 ug | INTRAMUSCULAR | Status: DC | PRN

## 2020-09-27 MED ORDER — LACTATED RINGERS IV SOLN: INTRAVENOUS | Status: DC | PRN

## 2020-09-27 MED ORDER — HEPARIN SODIUM (PORCINE) 5000 UNIT/ML IJ SOLN
5000.0000 [IU] | Freq: Three times a day (TID) | INTRAMUSCULAR | Status: DC
  Administered 2020-09-28 – 2020-10-03 (×15): 5000 [IU] via SUBCUTANEOUS
  Filled 2020-09-27 (×15): qty 1

## 2020-09-27 MED ORDER — BUPIVACAINE HCL (PF) 0.25 % IJ SOLN
INTRAMUSCULAR | Status: AC
  Filled 2020-09-27: qty 10

## 2020-09-27 MED ORDER — METOCLOPRAMIDE HCL 5 MG PO TABS: 5.0000 mg | ORAL_TABLET | Freq: Three times a day (TID) | ORAL | Status: DC | PRN

## 2020-09-27 MED ORDER — LACTATED RINGERS IV SOLN: INTRAVENOUS | Status: DC

## 2020-09-27 MED ORDER — ASPIRIN 81 MG PO CHEW
81.0000 mg | CHEWABLE_TABLET | Freq: Two times a day (BID) | ORAL | Status: DC
  Administered 2020-09-27 – 2020-10-03 (×12): 81 mg via ORAL
  Filled 2020-09-27 (×12): qty 1

## 2020-09-27 MED ORDER — PROPOFOL 10 MG/ML IV BOLUS
INTRAVENOUS | Status: DC | PRN
  Administered 2020-09-27: 70 mg via INTRAVENOUS

## 2020-09-27 MED ORDER — SODIUM CHLORIDE 0.9 % IV SOLN: 250.0000 mL | INTRAVENOUS | Status: DC | PRN

## 2020-09-27 MED ORDER — MOMETASONE FURO-FORMOTEROL FUM 100-5 MCG/ACT IN AERO
2.0000 | INHALATION_SPRAY | Freq: Two times a day (BID) | RESPIRATORY_TRACT | Status: DC
  Administered 2020-09-28 – 2020-10-03 (×11): 2 via RESPIRATORY_TRACT
  Filled 2020-09-27: qty 8.8

## 2020-09-27 MED ORDER — SODIUM CHLORIDE 0.9% FLUSH
3.0000 mL | Freq: Two times a day (BID) | INTRAVENOUS | Status: DC
  Administered 2020-09-27 – 2020-10-02 (×7): 3 mL via INTRAVENOUS

## 2020-09-27 MED ORDER — CHLORHEXIDINE GLUCONATE 4 % EX LIQD: 60.0000 mL | Freq: Once | CUTANEOUS | Status: DC

## 2020-09-27 MED ORDER — ONDANSETRON HCL 4 MG PO TABS: 4.0000 mg | ORAL_TABLET | Freq: Four times a day (QID) | ORAL | Status: DC | PRN

## 2020-09-27 MED ORDER — METOCLOPRAMIDE HCL 5 MG/ML IJ SOLN: 5.0000 mg | Freq: Three times a day (TID) | INTRAMUSCULAR | Status: DC | PRN

## 2020-09-27 MED ORDER — LIDOCAINE 2% (20 MG/ML) 5 ML SYRINGE
INTRAMUSCULAR | Status: AC
  Filled 2020-09-27: qty 10

## 2020-09-27 MED ORDER — PHENOL 1.4 % MT LIQD
1.0000 | OROMUCOSAL | Status: DC | PRN
Start: 2020-09-27 — End: 2020-10-03

## 2020-09-27 MED ORDER — VITAMIN D 25 MCG (1000 UNIT) PO TABS
1000.0000 [IU] | ORAL_TABLET | Freq: Every day | ORAL | Status: DC
  Administered 2020-09-28 – 2020-10-03 (×6): 1000 [IU] via ORAL
  Filled 2020-09-27 (×6): qty 1

## 2020-09-27 MED ORDER — HYDROMORPHONE HCL 1 MG/ML IJ SOLN
0.5000 mg | Freq: Once | INTRAMUSCULAR | Status: AC
  Administered 2020-09-27: 0.5 mg via INTRAVENOUS
  Filled 2020-09-27: qty 1

## 2020-09-27 MED ORDER — ONDANSETRON HCL 4 MG/2ML IJ SOLN: 4.0000 mg | Freq: Four times a day (QID) | INTRAMUSCULAR | Status: DC | PRN

## 2020-09-27 MED ORDER — TEMAZEPAM 15 MG PO CAPS
30.0000 mg | ORAL_CAPSULE | Freq: Every day | ORAL | Status: DC
  Administered 2020-09-27 – 2020-10-02 (×6): 30 mg via ORAL
  Filled 2020-09-27 (×6): qty 2

## 2020-09-27 MED ORDER — FENTANYL CITRATE (PF) 100 MCG/2ML IJ SOLN
INTRAMUSCULAR | Status: DC | PRN
  Administered 2020-09-27: 150 ug via INTRAVENOUS

## 2020-09-27 MED ORDER — HYDROCODONE-ACETAMINOPHEN 5-325 MG PO TABS
1.0000 | ORAL_TABLET | ORAL | Status: DC | PRN
Start: 2020-09-27 — End: 2020-10-02
  Administered 2020-09-28 – 2020-09-29 (×6): 2 via ORAL
  Administered 2020-09-29 – 2020-09-30 (×2): 1 via ORAL
  Administered 2020-09-30 – 2020-10-02 (×10): 2 via ORAL
  Filled 2020-09-27 (×9): qty 2
  Filled 2020-09-27: qty 1
  Filled 2020-09-27 (×5): qty 2
  Filled 2020-09-27: qty 1
  Filled 2020-09-27 (×2): qty 2

## 2020-09-27 MED ORDER — PANTOPRAZOLE SODIUM 40 MG PO TBEC
40.0000 mg | DELAYED_RELEASE_TABLET | Freq: Every day | ORAL | Status: DC
  Administered 2020-09-28 – 2020-10-03 (×6): 40 mg via ORAL
  Filled 2020-09-27: qty 1
  Filled 2020-09-27: qty 2
  Filled 2020-09-27 (×4): qty 1

## 2020-09-27 MED ORDER — LIDOCAINE 2% (20 MG/ML) 5 ML SYRINGE
INTRAMUSCULAR | Status: DC | PRN
  Administered 2020-09-27: 30 mg via INTRAVENOUS

## 2020-09-27 MED ORDER — ACETAMINOPHEN 325 MG PO TABS: 650.0000 mg | ORAL_TABLET | Freq: Four times a day (QID) | ORAL | Status: DC | PRN

## 2020-09-27 MED ORDER — SODIUM CHLORIDE 0.9% FLUSH: 3.0000 mL | INTRAVENOUS | Status: DC | PRN

## 2020-09-27 MED ORDER — BISACODYL 10 MG RE SUPP: 10.0000 mg | Freq: Every day | RECTAL | Status: DC | PRN

## 2020-09-27 MED ORDER — FENTANYL CITRATE (PF) 100 MCG/2ML IJ SOLN
25.0000 ug | INTRAMUSCULAR | Status: DC | PRN
  Administered 2020-09-27: 25 ug via INTRAVENOUS
  Filled 2020-09-27: qty 2

## 2020-09-27 MED ORDER — METHOCARBAMOL 1000 MG/10ML IJ SOLN
500.0000 mg | Freq: Four times a day (QID) | INTRAMUSCULAR | Status: DC | PRN
  Filled 2020-09-27: qty 5

## 2020-09-27 MED ORDER — ROCURONIUM BROMIDE 10 MG/ML (PF) SYRINGE
PREFILLED_SYRINGE | INTRAVENOUS | Status: AC
  Filled 2020-09-27: qty 20

## 2020-09-27 MED ORDER — TAMSULOSIN HCL 0.4 MG PO CAPS
0.4000 mg | ORAL_CAPSULE | Freq: Every day | ORAL | Status: DC
  Administered 2020-09-28 – 2020-10-03 (×6): 0.4 mg via ORAL
  Filled 2020-09-27 (×6): qty 1

## 2020-09-27 MED ORDER — PHENYLEPHRINE 40 MCG/ML (10ML) SYRINGE FOR IV PUSH (FOR BLOOD PRESSURE SUPPORT)
PREFILLED_SYRINGE | INTRAVENOUS | Status: AC
  Filled 2020-09-27: qty 30

## 2020-09-27 MED ORDER — CEFAZOLIN SODIUM-DEXTROSE 2-4 GM/100ML-% IV SOLN: 2.0000 g | INTRAVENOUS | Status: DC

## 2020-09-27 MED ORDER — NITROGLYCERIN 0.4 MG SL SUBL: 0.4000 mg | SUBLINGUAL_TABLET | SUBLINGUAL | Status: DC | PRN

## 2020-09-27 MED ORDER — ONDANSETRON HCL 4 MG/2ML IJ SOLN
INTRAMUSCULAR | Status: DC | PRN
  Administered 2020-09-27: 4 mg via INTRAVENOUS

## 2020-09-27 MED ORDER — PHENYLEPHRINE HCL (PRESSORS) 10 MG/ML IV SOLN
INTRAVENOUS | Status: DC | PRN
  Administered 2020-09-27: 200 ug via INTRAVENOUS
  Administered 2020-09-27: 80 ug via INTRAVENOUS

## 2020-09-27 MED ORDER — FENTANYL CITRATE (PF) 250 MCG/5ML IJ SOLN
INTRAMUSCULAR | Status: AC
  Filled 2020-09-27: qty 5

## 2020-09-27 MED ORDER — ATORVASTATIN CALCIUM 10 MG PO TABS
10.0000 mg | ORAL_TABLET | Freq: Every day | ORAL | Status: DC
  Administered 2020-09-28 – 2020-10-03 (×6): 10 mg via ORAL
  Filled 2020-09-27 (×6): qty 1

## 2020-09-27 MED ORDER — LEVOTHYROXINE SODIUM 75 MCG PO TABS
75.0000 ug | ORAL_TABLET | Freq: Every day | ORAL | Status: DC
  Administered 2020-09-28 – 2020-10-01 (×4): 75 ug via ORAL
  Filled 2020-09-27 (×4): qty 1

## 2020-09-27 MED ORDER — METHOCARBAMOL 500 MG PO TABS
500.0000 mg | ORAL_TABLET | Freq: Four times a day (QID) | ORAL | Status: DC | PRN
  Administered 2020-09-28 – 2020-10-02 (×6): 500 mg via ORAL
  Filled 2020-09-27 (×7): qty 1

## 2020-09-27 MED ORDER — METOPROLOL SUCCINATE ER 25 MG PO TB24
ORAL_TABLET | ORAL | Status: AC
  Administered 2020-09-27: 25 mg
  Filled 2020-09-27: qty 1

## 2020-09-27 MED ORDER — ADULT MULTIVITAMIN W/MINERALS CH
1.0000 | ORAL_TABLET | Freq: Every day | ORAL | Status: DC
  Administered 2020-09-28 – 2020-10-03 (×6): 1 via ORAL
  Filled 2020-09-27 (×6): qty 1

## 2020-09-27 MED ORDER — SUGAMMADEX SODIUM 200 MG/2ML IV SOLN
INTRAVENOUS | Status: DC | PRN
  Administered 2020-09-27 (×2): 100 mg via INTRAVENOUS

## 2020-09-27 MED ORDER — CEFAZOLIN SODIUM-DEXTROSE 2-3 GM-%(50ML) IV SOLR
INTRAVENOUS | Status: DC | PRN
  Administered 2020-09-27: 2 g via INTRAVENOUS

## 2020-09-27 MED ORDER — RALOXIFENE HCL 60 MG PO TABS
60.0000 mg | ORAL_TABLET | Freq: Every day | ORAL | Status: DC
  Administered 2020-10-01 – 2020-10-03 (×3): 60 mg via ORAL
  Filled 2020-09-27 (×8): qty 1

## 2020-09-27 MED ORDER — CEFAZOLIN SODIUM-DEXTROSE 2-4 GM/100ML-% IV SOLN
INTRAVENOUS | Status: AC
  Filled 2020-09-27: qty 100

## 2020-09-27 MED ORDER — METOPROLOL TARTRATE 12.5 MG HALF TABLET
25.0000 mg | ORAL_TABLET | Freq: Once | ORAL | Status: AC
  Administered 2020-09-27: 25 mg via ORAL
  Filled 2020-09-27: qty 2

## 2020-09-27 MED ORDER — ORAL CARE MOUTH RINSE: 15.0000 mL | Freq: Once | OROMUCOSAL | Status: AC

## 2020-09-27 MED ORDER — EPHEDRINE 5 MG/ML INJ
INTRAVENOUS | Status: AC
  Filled 2020-09-27: qty 10

## 2020-09-27 MED ORDER — PHENYLEPHRINE HCL-NACL 10-0.9 MG/250ML-% IV SOLN
INTRAVENOUS | Status: DC | PRN
  Administered 2020-09-27: 75 ug/min via INTRAVENOUS

## 2020-09-27 MED ORDER — POVIDONE-IODINE 10 % EX SWAB: 2.0000 "application " | Freq: Once | CUTANEOUS | Status: DC

## 2020-09-27 MED ORDER — MORPHINE SULFATE (PF) 4 MG/ML IV SOLN
INTRAVENOUS | Status: AC
  Filled 2020-09-27: qty 1

## 2020-09-27 MED ORDER — OXYCODONE HCL 5 MG PO TABS
5.0000 mg | ORAL_TABLET | ORAL | Status: DC | PRN
  Administered 2020-09-28 – 2020-09-29 (×3): 5 mg via ORAL
  Filled 2020-09-27 (×3): qty 1

## 2020-09-27 MED ORDER — METOPROLOL TARTRATE 25 MG PO TABS
25.0000 mg | ORAL_TABLET | Freq: Two times a day (BID) | ORAL | Status: DC
  Administered 2020-09-27 – 2020-09-28 (×2): 25 mg via ORAL
  Filled 2020-09-27 (×2): qty 1

## 2020-09-27 MED ORDER — LIDOCAINE HCL (CARDIAC) PF 100 MG/5ML IV SOSY: PREFILLED_SYRINGE | INTRAVENOUS | Status: DC | PRN

## 2020-09-27 MED ORDER — ROCURONIUM BROMIDE 10 MG/ML (PF) SYRINGE
PREFILLED_SYRINGE | INTRAVENOUS | Status: DC | PRN
  Administered 2020-09-27: 50 mg via INTRAVENOUS
  Administered 2020-09-27: 10 mg via INTRAVENOUS

## 2020-09-27 MED ORDER — SODIUM CHLORIDE 0.9% FLUSH
3.0000 mL | Freq: Two times a day (BID) | INTRAVENOUS | Status: DC
  Administered 2020-09-27 – 2020-10-03 (×11): 3 mL via INTRAVENOUS

## 2020-09-27 MED ORDER — BUPIVACAINE HCL (PF) 0.25 % IJ SOLN
INTRAMUSCULAR | Status: AC
  Filled 2020-09-27: qty 30

## 2020-09-27 MED ORDER — CEFAZOLIN SODIUM-DEXTROSE 2-4 GM/100ML-% IV SOLN
2.0000 g | Freq: Three times a day (TID) | INTRAVENOUS | Status: AC
  Administered 2020-09-27 – 2020-09-28 (×2): 2 g via INTRAVENOUS
  Filled 2020-09-27 (×2): qty 100

## 2020-09-27 MED ORDER — POLYETHYLENE GLYCOL 3350 17 G PO PACK
17.0000 g | PACK | Freq: Two times a day (BID) | ORAL | Status: DC
  Administered 2020-09-27 – 2020-09-28 (×2): 17 g via ORAL
  Filled 2020-09-27 (×3): qty 1

## 2020-09-27 MED ORDER — MENTHOL 3 MG MT LOZG: 1.0000 | LOZENGE | OROMUCOSAL | Status: DC | PRN

## 2020-09-27 MED ORDER — ESTROGENS, CONJUGATED 0.625 MG/GM VA CREA
1.0000 | TOPICAL_CREAM | Freq: Every day | VAGINAL | Status: DC
  Administered 2020-10-02: 1 via VAGINAL
  Filled 2020-09-27 (×2): qty 30

## 2020-09-27 MED ORDER — CHLORHEXIDINE GLUCONATE 0.12 % MT SOLN
15.0000 mL | Freq: Once | OROMUCOSAL | Status: AC
  Administered 2020-09-27: 15 mL via OROMUCOSAL
  Filled 2020-09-27: qty 15

## 2020-09-27 MED ORDER — BUPIVACAINE HCL 0.25 % IJ SOLN
INTRAMUSCULAR | Status: DC | PRN
  Administered 2020-09-27: 30 mL

## 2020-09-27 MED ORDER — POLYETHYLENE GLYCOL 3350 17 G PO PACK: 17.0000 g | PACK | Freq: Every day | ORAL | Status: DC | PRN

## 2020-09-27 MED ORDER — ACETAMINOPHEN 650 MG RE SUPP: 650.0000 mg | Freq: Four times a day (QID) | RECTAL | Status: DC | PRN

## 2020-09-27 MED ORDER — MORPHINE SULFATE (PF) 2 MG/ML IV SOLN
0.5000 mg | INTRAVENOUS | Status: DC | PRN
  Administered 2020-09-30 (×2): 0.5 mg via INTRAVENOUS
  Filled 2020-09-27 (×2): qty 1

## 2020-09-27 MED ORDER — SODIUM CHLORIDE 0.9 % IV SOLN: INTRAVENOUS | Status: DC

## 2020-09-27 MED ORDER — DEXAMETHASONE SODIUM PHOSPHATE 10 MG/ML IJ SOLN
INTRAMUSCULAR | Status: DC | PRN
  Administered 2020-09-27: 10 mg via INTRAVENOUS

## 2020-09-27 MED ORDER — DOCUSATE SODIUM 100 MG PO CAPS
100.0000 mg | ORAL_CAPSULE | Freq: Two times a day (BID) | ORAL | Status: DC
  Administered 2020-09-27 – 2020-09-28 (×2): 100 mg via ORAL
  Filled 2020-09-27 (×2): qty 1

## 2020-09-27 MED ORDER — ACETAMINOPHEN 325 MG PO TABS
325.0000 mg | ORAL_TABLET | Freq: Four times a day (QID) | ORAL | Status: DC | PRN
  Administered 2020-09-29 – 2020-10-03 (×2): 650 mg via ORAL
  Filled 2020-09-27 (×3): qty 2

## 2020-09-27 MED ORDER — ONDANSETRON HCL 4 MG/2ML IJ SOLN
INTRAMUSCULAR | Status: AC
  Filled 2020-09-27: qty 4

## 2020-09-27 MED ORDER — HYDRALAZINE HCL 20 MG/ML IJ SOLN: 10.0000 mg | Freq: Four times a day (QID) | INTRAMUSCULAR | Status: DC | PRN

## 2020-09-27 MED ORDER — ALBUTEROL SULFATE (2.5 MG/3ML) 0.083% IN NEBU
2.5000 mg | INHALATION_SOLUTION | RESPIRATORY_TRACT | Status: DC | PRN
  Filled 2020-09-27: qty 3

## 2020-09-27 MED ORDER — DEXAMETHASONE SODIUM PHOSPHATE 10 MG/ML IJ SOLN
INTRAMUSCULAR | Status: AC
  Filled 2020-09-27: qty 2

## 2020-09-27 SURGICAL SUPPLY — 62 items
BIT DRILL INTERTAN LAG SCREW (BIT) ×2 IMPLANT
BIT DRILL SHORT 4.0 (BIT) ×1 IMPLANT
BLADE CLIPPER SURG (BLADE) IMPLANT
BLADE SURG 15 STRL LF DISP TIS (BLADE) ×1 IMPLANT
BLADE SURG 15 STRL SS (BLADE) ×2
COVER MAYO STAND STRL (DRAPES) ×2 IMPLANT
COVER PERINEAL POST (MISCELLANEOUS) ×2 IMPLANT
COVER SURGICAL LIGHT HANDLE (MISCELLANEOUS) ×2 IMPLANT
COVER WAND RF STERILE (DRAPES) ×2 IMPLANT
DRAPE HALF SHEET 40X57 (DRAPES) IMPLANT
DRAPE INCISE IOBAN 66X45 STRL (DRAPES) ×4 IMPLANT
DRAPE ORTHO SPLIT 77X108 STRL (DRAPES)
DRAPE STERI IOBAN 125X83 (DRAPES) ×2 IMPLANT
DRAPE SURG ORHT 6 SPLT 77X108 (DRAPES) IMPLANT
DRILL BIT SHORT 4.0 (BIT) ×2
DRSG AQUACEL AG ADV 3.5X 4 (GAUZE/BANDAGES/DRESSINGS) ×6 IMPLANT
DRSG MEPILEX BORDER 4X4 (GAUZE/BANDAGES/DRESSINGS) IMPLANT
DRSG MEPILEX BORDER 4X8 (GAUZE/BANDAGES/DRESSINGS) ×2 IMPLANT
DURAPREP 26ML APPLICATOR (WOUND CARE) ×2 IMPLANT
ELECT REM PT RETURN 9FT ADLT (ELECTROSURGICAL) ×2
ELECTRODE REM PT RTRN 9FT ADLT (ELECTROSURGICAL) ×1 IMPLANT
FACESHIELD WRAPAROUND (MASK) ×2 IMPLANT
GAUZE XEROFORM 5X9 LF (GAUZE/BANDAGES/DRESSINGS) ×2 IMPLANT
GLOVE ECLIPSE 8.0 STRL XLNG CF (GLOVE) ×2 IMPLANT
GLOVE SRG 8 PF TXTR STRL LF DI (GLOVE) ×1 IMPLANT
GLOVE SURG UNDER POLY LF SZ8 (GLOVE) ×2
GOWN STRL REUS W/ TWL LRG LVL3 (GOWN DISPOSABLE) ×1 IMPLANT
GOWN STRL REUS W/ TWL XL LVL3 (GOWN DISPOSABLE) IMPLANT
GOWN STRL REUS W/TWL LRG LVL3 (GOWN DISPOSABLE) ×2
GOWN STRL REUS W/TWL XL LVL3 (GOWN DISPOSABLE)
GUIDE PIN 3.2X343 (PIN) ×2
GUIDE PIN 3.2X343MM (PIN) ×4
GUIDE ROD 3.0 (MISCELLANEOUS) ×2
KIT BASIN OR (CUSTOM PROCEDURE TRAY) ×2 IMPLANT
KIT TURNOVER KIT B (KITS) ×2 IMPLANT
MANIFOLD NEPTUNE II (INSTRUMENTS) ×2 IMPLANT
NAIL TRIGEN LEFT 10X38-125 (Nail) ×2 IMPLANT
NEEDLE 22X1 1/2 (OR ONLY) (NEEDLE) ×2 IMPLANT
NS IRRIG 1000ML POUR BTL (IV SOLUTION) ×2 IMPLANT
PACK GENERAL/GYN (CUSTOM PROCEDURE TRAY) ×2 IMPLANT
PAD ARMBOARD 7.5X6 YLW CONV (MISCELLANEOUS) ×4 IMPLANT
PIN GUIDE 3.2X343MM (PIN) ×2 IMPLANT
ROD GUIDE 3.0 (MISCELLANEOUS) ×1 IMPLANT
SCREW LAG COMPR KIT 90/85 (Screw) ×2 IMPLANT
SCREW TRIGEN LOW PROF 5.0X40 (Screw) ×2 IMPLANT
SPONGE LAP 4X18 RFD (DISPOSABLE) IMPLANT
STAPLER VISISTAT 35W (STAPLE) ×2 IMPLANT
SUT ETHILON 2 0 FS 18 (SUTURE) IMPLANT
SUT ETHILON 3 0 PS 1 (SUTURE) ×6 IMPLANT
SUT VIC AB 0 CT1 27 (SUTURE) ×2
SUT VIC AB 0 CT1 27XBRD ANBCTR (SUTURE) ×1 IMPLANT
SUT VIC AB 1 CT1 27 (SUTURE) ×4
SUT VIC AB 1 CT1 27XBRD ANBCTR (SUTURE) ×2 IMPLANT
SUT VIC AB 2-0 CT1 27 (SUTURE) ×2
SUT VIC AB 2-0 CT1 TAPERPNT 27 (SUTURE) ×1 IMPLANT
SUT VIC AB 2-0 CTB1 (SUTURE) ×2 IMPLANT
SYR 30ML SLIP (SYRINGE) ×2 IMPLANT
SYR CONTROL 10ML LL (SYRINGE) ×2 IMPLANT
TAPE STRIPS DRAPE STRL (GAUZE/BANDAGES/DRESSINGS) IMPLANT
TOWEL GREEN STERILE (TOWEL DISPOSABLE) ×2 IMPLANT
TOWEL GREEN STERILE FF (TOWEL DISPOSABLE) ×2 IMPLANT
WATER STERILE IRR 1000ML POUR (IV SOLUTION) ×2 IMPLANT

## 2020-09-27 NOTE — Progress Notes (Signed)
Patient's radiographs reviewed Left hip intertrochanteric fracture is present Tentatively plan for operative fixation early tonight Please keep the patient n.p.o. and do not start any blood thinners Medical admission, medical optimization, and risk stratification pending

## 2020-09-27 NOTE — ED Triage Notes (Signed)
Pt fell last night around midnight. Left hip hurts and pt unable to ambulate. EMS brought pt in to ED.

## 2020-09-27 NOTE — Consult Note (Signed)
Reason for Consult: Left hip fracture Referring Physician: Dr. Thompson [name] is an 85 y.o. female.  HPI: Mckenzie [name] is an 85 year old ambulatory patient who uses no assistive devices to walk who fell early this morning.  No loss of consciousness.  Mechanical fall.  She lives with her husband and son at home.  Has multiple medical comorbidities.  Had echocardiogram this morning the results of which are not available.  Denies any other orthopedic complaints and there was no loss of consciousness.  Past Medical History:  Diagnosis Date  . Aortic valve sclerosis    Mild, echo, April, 2012  . ARDS (adult respiratory distress syndrome) (HCC) 2003   with tracheostomy-peritonitis  . Arthritis   . Back pain    Need for surgery, Dr. Trey Sailors  . CAD (coronary artery disease)    Stent to LAD, 2003 [facility] / nuclear scan April, 201 to artifact / persistent shortness of breath / catheterization Oct 21, 2010... widely patent stent to the mid LAD, no significant obstructive disease, should be low risk    for back surgery  . Carotid arterial disease (HCC)    Moderate-Dr. Myra Gianotti following  . Chronic back pain   . COPD (chronic obstructive pulmonary disease) (HCC)   . Diastolic dysfunction    Echo, April, 2011   EF 65%  . Dysrhythmia    afib  . Ejection fraction    EF 65%, echo, September 15, 2010  . GERD (gastroesophageal reflux disease)   . Hemolytic anemia (HCC)    Followed by Dr. Derald Macleod  . Hypertension   . Hypothyroidism   . Left ventricular hypertrophy    EF 65%, echo, April, 2011  . MR (mitral regurgitation)     Mild echo, 2012 ///  Mild per Echo 2009,  mild, echo, April, 2011  . Myocardial infarction (HCC)   . Palpitations    Holter, September 16, 2010, PACs and PVCs,one 4 beat run of SVT  . Pneumonia 11-2011  . Preoperative clearance    Preop clearance for back surgery  . Shortness of breath    March, 2012  . UTI (urinary tract infection)    Chronic-on Macrodantin    Past Surgical  History:  Procedure Laterality Date  . APPENDECTOMY    . BACK SURGERY    . BREAST LUMPECTOMY Left   . CATARACT EXTRACTION W/PHACO Right 12/05/2012   Procedure: CATARACT EXTRACTION PHACO AND INTRAOCULAR LENS PLACEMENT (IOC);  Surgeon: Gemma Payor, MD;  Location: [facility];  Service: Ophthalmology;  Laterality: Right;  CDE:15.30  . CATARACT EXTRACTION W/PHACO Left 12/15/2012   Procedure: CATARACT EXTRACTION PHACO AND INTRAOCULAR LENS PLACEMENT (IOC);  Surgeon: Gemma Payor, MD;  Location: [facility];  Service: Ophthalmology;  Laterality: Left;  CDE:22.56  . CORONARY ANGIOPLASTY WITH STENT PLACEMENT  2003  . repair of ruptured lumbar discs  12-2011  . SPINE SURGERY     lumbar disk removal by Dr. Laurian Brim at [facility]  . THYROID SURGERY  08/2009   Benign adenomas  . TONSILLECTOMY    . TRACHEOSTOMY  2003   Following ARDS     Family History  Problem Relation Age of Onset  . Heart failure Father   . Diabetes Son     Social History:  reports that she quit smoking about 20 years ago. Her smoking use included cigarettes. She has a 20.00 pack-year smoking history. She has never used smokeless tobacco. She reports that she does not drink alcohol and does not use drugs.  Allergies:  Allergies  Allergen Reactions  . Tetracycline Nausea Only    Medications: I have reviewed the patient's current medications.  Results for orders placed or performed during the hospital encounter of 09/27/20 (from the past 48 hour(s))  CBC with Differential/Platelet     Status: Abnormal   Collection Time: 09/27/20  9:19 AM  Result Value Ref Range   WBC 13.3 (H) 4.0 - 10.5 K/uL   RBC 3.36 (L) 3.87 - 5.11 MIL/uL   Hemoglobin 10.4 (L) 12.0 - 15.0 g/dL   HCT 17.6 (L) 16.0 - 73.7 %   MCV 94.3 80.0 - 100.0 fL   MCH 31.0 26.0 - 34.0 pg   MCHC 32.8 30.0 - 36.0 g/dL   RDW 10.6 26.9 - 48.5 %   Platelets 175 150 - 400 K/uL   nRBC 0.0 0.0 - 0.2 %   Neutrophils Relative % 81 %   Neutro Abs 10.6 (H) 1.7 - 7.7 K/uL    Lymphocytes Relative 8 %   Lymphs Abs 1.1 0.7 - 4.0 K/uL   Monocytes Relative 11 %   Monocytes Absolute 1.5 (H) 0.1 - 1.0 K/uL   Eosinophils Relative 0 %   Eosinophils Absolute 0.0 0.0 - 0.5 K/uL   Basophils Relative 0 %   Basophils Absolute 0.0 0.0 - 0.1 K/uL   Immature Granulocytes 0 %   Abs Immature Granulocytes 0.05 0.00 - 0.07 K/uL    Comment: Performed at [facility], [address] [place], [place] [address]  Comprehensive metabolic panel     Status: Abnormal   Collection Time: 09/27/20  9:19 AM  Result Value Ref Range   Sodium 140 135 - 145 mmol/L   Potassium 3.4 (L) 3.5 - 5.1 mmol/L   Chloride 105 98 - 111 mmol/L   CO2 26 22 - 32 mmol/L   Glucose, Bld 115 (H) 70 - 99 mg/dL    Comment: Glucose reference range applies only to samples taken after fasting for at least 8 hours.   BUN 15 8 - 23 mg/dL   Creatinine, Ser 3.50 0.44 - 1.00 mg/dL   Calcium 8.2 (L) 8.9 - 10.3 mg/dL   Total Protein 5.1 (L) 6.5 - 8.1 g/dL   Albumin 3.2 (L) 3.5 - 5.0 g/dL   AST 27 15 - 41 U/L   ALT 19 0 - 44 U/L   Alkaline Phosphatase 45 38 - 126 U/L   Total Bilirubin 0.8 0.3 - 1.2 mg/dL   GFR, Estimated >09 >38 mL/min    Comment: (NOTE) Calculated using the CKD-EPI Creatinine Equation (2021)    Anion gap 9 5 - 15    Comment: Performed at [facility], [address], [place], [place] [address]  Resp Panel by RT-PCR (Flu A&B, Covid) Nasopharyngeal Swab     Status: None   Collection Time: 09/27/20 11:26 AM   Specimen: Nasopharyngeal Swab; Nasopharyngeal(NP) swabs in vial transport medium  Result Value Ref Range   SARS Coronavirus 2 by RT PCR NEGATIVE NEGATIVE    Comment: (NOTE) SARS-CoV-2 target nucleic acids are NOT DETECTED.  The SARS-CoV-2 RNA is generally detectable in upper respiratory specimens during the acute phase of infection. The lowest concentration of SARS-CoV-2 viral copies this assay can detect is 138 copies/mL. A negative result does not preclude SARS-Cov-2 infection and should  not be used as the sole basis for treatment or other patient management decisions. A negative result may occur with  improper specimen collection/handling, submission of specimen other than nasopharyngeal swab, presence of  viral mutation(s) within the areas targeted by this assay, and inadequate number of viral copies(<138 copies/mL). A negative result must be combined with clinical observations, patient history, and epidemiological information. The expected result is Negative.  Fact Sheet for Patients:  BloggerCourse.comhttps://www.fda.gov/media/152166/download  Fact Sheet for Healthcare Providers:  SeriousBroker.ithttps://www.fda.gov/media/152162/download  This test is no t yet approved or cleared by the [place] FDA and  has been authorized for detection and/or diagnosis of SARS-CoV-2 by FDA under an Emergency Use Authorization (EUA). This EUA will remain  in effect (meaning this test can be used) for the duration of the COVID-19 declaration under Section 564(b)(1) of the Act, 21 U.S.C.section 360bbb-3(b)(1), unless the authorization is terminated  or revoked sooner.       Influenza A by PCR NEGATIVE NEGATIVE   Influenza B by PCR NEGATIVE NEGATIVE    Comment: (NOTE) The Xpert Xpress SARS-CoV-2/FLU/RSV plus assay is intended as an aid in the diagnosis of influenza from Nasopharyngeal swab specimens and should not be used as a sole basis for treatment. Nasal washings and aspirates are unacceptable for Xpert Xpress SARS-CoV-2/FLU/RSV testing.  Fact Sheet for Patients: BloggerCourse.comhttps://www.fda.gov/media/152166/download  Fact Sheet for Healthcare Providers: SeriousBroker.ithttps://www.fda.gov/media/152162/download  This test is not yet approved or cleared by the [place] FDA and has been authorized for detection and/or diagnosis of SARS-CoV-2 by FDA under an Emergency Use Authorization (EUA). This EUA will remain in effect (meaning this test can be used) for the duration of the COVID-19 declaration under Section 564(b)(1)  of the Act, 21 U.S.C. section 360bbb-3(b)(1), unless the authorization is terminated or revoked.  Performed at [facility], [address] [place], [place] [id number]     DG Chest [place] 1 View  Result Date: 09/27/2020 CLINICAL DATA:  Larey SeatFell last night, unable to walk, RIGHT hip fracture; history coronary artery disease post MI, COPD, GERD, hypertension EXAM: PORTABLE CHEST 1 VIEW COMPARISON:  08/07/2027 as and 19 FINDINGS: Intraspinal stimulator lower thoracic spine. Normal heart size, mediastinal contours, and pulmonary vascularity. Atherosclerotic calcification aorta. Lungs appear emphysematous but clear. Elevation of RIGHT diaphragm unchanged. No pleural effusion or pneumothorax. Skin fold projects over upper RIGHT chest. Bones demineralized. IMPRESSION: Emphysematous changes without infiltrate. Aortic Atherosclerosis (ICD10-I70.0) and Emphysema (ICD10-J43.9). Electronically Signed   By: Ulyses SouthwardMark  Boles M.D.   On: 09/27/2020 10:08   DG Hip Unilat W or Wo Pelvis 2-3 Views Left  Result Date: 09/27/2020 CLINICAL DATA:  Larey SeatFell last night, LEFT hip pain, unable to walk, has pain stimulator EXAM: DG HIP (WITH OR WITHOUT PELVIS) 2-3V LEFT COMPARISON:  CT abdomen and pelvis 09/06/2019 FINDINGS: Osseous demineralization. Prior lower lumbar fusion. Hip and SI joint spaces preserved. Displaced, angulated, and overriding intertrochanteric fracture LEFT femur. No dislocation. Pain stimulator projects over upper lateral RIGHT pelvis. Pelvis intact. IMPRESSION: Displaced angulated and overriding intertrochanteric fracture LEFT femur. Prior lumbar fusion. Electronically Signed   By: Ulyses SouthwardMark  Boles M.D.   On: 09/27/2020 10:06    Review of Systems  Musculoskeletal: Positive for arthralgias.  All other systems reviewed and are negative.  Blood pressure (!) 123/40, pulse (!) 103, temperature 98.8 F (37.1 C), temperature source Oral, resp. rate 11, height 5\' 2"  (1.575 m), weight 46.7 kg, SpO2 95 %. Physical Exam Vitals  reviewed.  HENT:     Head: Normocephalic.     Nose: Nose normal.     Mouth/Throat:     Mouth: Mucous membranes are moist.  Eyes:     Pupils: Pupils are equal, round, and reactive to light.  Cardiovascular:  Rate and Rhythm: Normal rate.     Pulses: Normal pulses.  Pulmonary:     Effort: Pulmonary effort is normal.  Abdominal:     General: Abdomen is flat.  Musculoskeletal:     Cervical back: Normal range of motion.  Skin:    General: Skin is warm.     Capillary Refill: Capillary refill takes less than 2 seconds.  Neurological:     General: No focal deficit present.     Mental Status: She is alert.  Psychiatric:        Mood and Affect: Mood normal.   Examination of the extremities demonstrates palpable radial pulses bilaterally.  Patient has some areas of ecchymosis diffusely around the arms consistent with easy bruising.  Has excellent wrist elbow shoulder range of motion actively and passively.  Good grip strength bilaterally.  Right leg has good ankle dorsiflexion strength with no knee effusion and no groin pain with internal or external Tatian of the leg.  Left hip is held in the flexed in a deducted position.  Ankle dorsiflexion intact.  Pedal pulses palpable.  Assessment/Plan: Impression is left hip intertrochanteric fracture.  Plan is left hip IMH S.  Risk and benefits are discussed with the patient including not limited to infection nerve vessel damage nonunion malunion as well as potential need for more surgery as well as a potential for prolonged period of nonweightbearing.  Patient understands risk and benefits.  All questions answered  Burnard Bunting 09/27/2020, 3:17 PM

## 2020-09-27 NOTE — Anesthesia Postprocedure Evaluation (Signed)
Anesthesia Post Note  Patient: Mckenzie [name]  Procedure(s) Performed: INTRAMEDULLARY (IM) NAIL INTERTROCHANTRIC (Left Hip)     Patient location during evaluation: PACU Anesthesia Type: General Level of consciousness: awake Pain management: pain level controlled Vital Signs Assessment: post-procedure vital signs reviewed and stable Respiratory status: spontaneous breathing Cardiovascular status: stable Postop Assessment: no apparent nausea or vomiting Anesthetic complications: no   No complications documented.  Last Vitals:  Vitals:   09/27/20 1940 09/27/20 1955  BP: (!) 118/48 (!) 124/48  Pulse: 89 82  Resp: 14 11  Temp:    SpO2: 98% 99%    Last Pain:  Vitals:   09/27/20 1955  TempSrc:   PainSc: 0-No pain                 Trixie Maclaren

## 2020-09-27 NOTE — ED Notes (Signed)
Echo in process 

## 2020-09-27 NOTE — Anesthesia Procedure Notes (Signed)
Procedure Name: Intubation Date/Time: 09/27/2020 5:12 PM Performed by: Gwenyth Allegra, CRNA Pre-anesthesia Checklist: Patient identified, Emergency Drugs available, Suction available, Patient being monitored and Timeout performed Patient Re-evaluated:Patient Re-evaluated prior to induction Oxygen Delivery Method: Circle system utilized Preoxygenation: Pre-oxygenation with 100% oxygen Induction Type: IV induction Ventilation: Mask ventilation without difficulty and Oral airway inserted - appropriate to patient size Grade View: Grade I Tube type: Oral Tube size: 7.0 mm Airway Equipment and Method: Stylet Placement Confirmation: ETT inserted through vocal cords under direct vision,  positive ETCO2 and breath sounds checked- equal and bilateral Secured at: 20 cm Tube secured with: Tape Dental Injury: Teeth and Oropharynx as per pre-operative assessment

## 2020-09-27 NOTE — Transfer of Care (Addendum)
Immediate Anesthesia Transfer of Care Note  Patient: [name]  Procedure(s) Performed: INTRAMEDULLARY (IM) NAIL INTERTROCHANTRIC (Left Hip)  Patient Location: PACU  Anesthesia Type:General  Level of Consciousness: awake, alert  and oriented  Airway & Oxygen Therapy: Patient Spontanous Breathing  Post-op Assessment: Report given to RN and Post -op Vital signs reviewed and stable  Post vital signs: Reviewed and stable  Last Vitals:  Vitals Value Taken Time  BP 118/50 09/27/20 1907  Temp    Pulse 93 09/27/20 1907  Resp 17 09/27/20 1907  SpO2 100 % 09/27/20 1907  Vitals shown include unvalidated device data.  Last Pain:  Vitals:   09/27/20 0908  TempSrc: Oral  PainSc: 10-Worst pain ever         Complications: No complications documented.

## 2020-09-27 NOTE — H&P (Signed)
Patient Demographics:    Mckenzie [name], is a 85 y.o. female  MRN: [id number]   DOB - Apr 26, 1936  Admit Date - 09/27/2020  Outpatient Primary MD for the patient is Mckenzie Shivers, PA-C   Assessment & Plan:    Principal Problem:   Lt Hip Hip fracture -s/p Mechanical Fall Active Problems:   Mild Aortic valve sclerosis   CAD (coronary artery disease)/Stent to LAD, 2003 [facility]   HTN (hypertension)   Hypothyroidism, postsurgical   COPD (chronic obstructive pulmonary disease) (HCC)   GERD   Irritable bowel syndrome (IBS)    1)Lt Hip Fx/Left hip intertrochanteric fracture is --status post mechanical fall with left hip fracture -Opiates for pain control -Orthopedic input from Dr. Cammy Copa appreciated plan is for operative fixation on 09/27/2020 - 2)H/o CAD with prior stent to LAD in 2003-no chest pains, no ACS type symptoms  -EKG without acute concerns -continue Lipitor, aspirin and metoprolol  3) IBS with predominant constipation----continue MiraLAX, hold Movantik, hold Levsin  4)GERD-stable, continue PPI  5)HTN--stable, hold benazepril, continue metoprolol, may use IV hydralazine as needed elevated BP  6) postsurgical hypothyroidism--- continue levothyroxine, check TSH  7)Heart Murmur--history of mild AS and mild MR, echo from November 2019 noted with preserved EF of 60 to 65%--- repeat echo requested  8)COPD-chest x-ray with emphysema, no acute exacerbation--continue bronchodilators   9) social/ethics--- plan of care and advanced directive discussed with patient and husband at bedside, patient is a full code  10) leukocytosis--WBC 13, reactive secondary to hip fracture versus infection -Check UA -Chest x-ray without acute cardiopulmonary findings  Disposition/Need for in-Hospital Stay-  patient unable to be discharged at this time due to --hip fracture requiring operative fixation, most likely will need SNF rehab postop  Status is: Inpatient  Remains inpatient appropriate because:Please see above   Dispo: The patient is from: Home              Anticipated d/c is to: SNF              Anticipated d/c date is: 2 days              Patient currently is not medically stable to d/c. Barriers: Not Clinically Stable-    With History of - Reviewed by me  Past Medical History:  Diagnosis Date  . Aortic valve sclerosis    Mild, echo, April, 2012  . ARDS (adult respiratory distress syndrome) (HCC) 2003   with tracheostomy-peritonitis  . Arthritis   . Back pain    Need for surgery, Dr. Trey Sailors  . CAD (coronary artery disease)    Stent to LAD, 2003 [facility] / nuclear scan April, 201 to artifact / persistent shortness of breath / catheterization Oct 21, 2010... widely patent stent to the mid LAD, no significant obstructive disease, should be low risk    for back surgery  . Carotid arterial disease (HCC)    Moderate-Dr. Myra Gianotti following  .  Chronic back pain   . COPD (chronic obstructive pulmonary disease) (HCC)   . Diastolic dysfunction    Echo, April, 2011   EF 65%  . Dysrhythmia    afib  . Ejection fraction    EF 65%, echo, September 15, 2010  . GERD (gastroesophageal reflux disease)   . Hemolytic anemia (HCC)    Followed by Dr. Derald Macleod  . Hypertension   . Hypothyroidism   . Left ventricular hypertrophy    EF 65%, echo, April, 2011  . MR (mitral regurgitation)     Mild echo, 2012 ///  Mild per Echo 2009,  mild, echo, April, 2011  . Myocardial infarction (HCC)   . Palpitations    Holter, September 16, 2010, PACs and PVCs,one 4 beat run of SVT  . Pneumonia 11-2011  . Preoperative clearance    Preop clearance for back surgery  . Shortness of breath    March, 2012  . UTI (urinary tract infection)    Chronic-on Macrodantin      Past Surgical History:  Procedure  Laterality Date  . APPENDECTOMY    . BACK SURGERY    . BREAST LUMPECTOMY Left   . CATARACT EXTRACTION W/PHACO Right 12/05/2012   Procedure: CATARACT EXTRACTION PHACO AND INTRAOCULAR LENS PLACEMENT (IOC);  Surgeon: Gemma Payor, MD;  Location: [facility];  Service: Ophthalmology;  Laterality: Right;  CDE:15.30  . CATARACT EXTRACTION W/PHACO Left 12/15/2012   Procedure: CATARACT EXTRACTION PHACO AND INTRAOCULAR LENS PLACEMENT (IOC);  Surgeon: Gemma Payor, MD;  Location: [facility];  Service: Ophthalmology;  Laterality: Left;  CDE:22.56  . CORONARY ANGIOPLASTY WITH STENT PLACEMENT  2003  . repair of ruptured lumbar discs  12-2011  . SPINE SURGERY     lumbar disk removal by [facility]. Laurian Brim [facility]  . THYROID SURGERY  08/2009   Benign adenomas  . TONSILLECTOMY    . TRACHEOSTOMY  2003   Following ARDS       Chief Complaint  Patient presents with  . Fall      HPI:    Mckenzie [name]  is a 85 y.o. female 85 year old with history of CAD status post prior stenting 2003, COPD, GERD, postsurgical hypothyroidism, history of mild aortic stenosis, mild MR, as well as IBS presents to the ED with left hip pain after falling around midnight yesterday -Both patient and husband states that this was a mechanical fall -Denies syncope, denies chest pains palpitations dizziness or shortness of breath before or after fall -Denies seizures -In the ED hip x-rays demonstrate displaced angulated and overriding intertrochanteric femur fracture on the left-- -EDP discussed with on-call orthopedic surgeon Dr. Corrie Mckusick Dean--who recommends transfer to [facility] for operative fixation -Chest x-ray with emphysema otherwise no acute cardiopulmonary findings -EKG is irregular but sinus with PACs -WBC 13.3 hemoglobin 10.4 which is close to patient's baseline -Platelets 175, potassium is 3.4 sodium is 140, glucose 115 creatinine 0.67 -COVID negative -Additional history obtained from patient's husband at bedside    Review of systems:    In addition to the HPI above,   A full Review of  Systems was done, all other systems reviewed are negative except as noted above in HPI , .    Social History:  Reviewed by me    Social History   Tobacco Use  . Smoking status: Former Smoker    Packs/day: 1.00    Years: 20.00    Pack years: 20.00    Types: Cigarettes    Quit date: 06/15/2000  Years since quitting: 20.2  . Smokeless tobacco: Never Used  Substance Use Topics  . Alcohol use: No     Family History :  Reviewed by me    Family History  Problem Relation Age of Onset  . Heart failure Father   . Diabetes Son       Home Medications:   Prior to Admission medications   Medication Sig Start Date End Date Taking? Authorizing Provider  atorvastatin (LIPITOR) 10 MG tablet Take 10 mg by mouth daily.  07/22/12  Yes [provider]  benazepril (LOTENSIN) 5 MG tablet Take 5 mg by mouth daily.   Yes [provider]  Calcium Carbonate (CALCIUM 600 PO) Take 1,200 mg by mouth daily.   Yes [provider]  Cholecalciferol (VITAMIN D3) 1000 UNITS CAPS Take 1,000 Units by mouth daily. Take one tab daily   Yes [provider]  conjugated estrogens (PREMARIN) vaginal cream Place 1 Applicatorful vaginally daily.   Yes [provider]  diphenhydrAMINE (BENADRYL) 25 mg capsule Take 25 mg by mouth daily as needed for sleep.   Yes [provider]  esomeprazole (NEXIUM) 40 MG capsule Take 40 mg by mouth daily before breakfast.   Yes [provider]  HYDROcodone-acetaminophen (NORCO) 10-325 MG tablet Take 1 tablet by mouth every 4 (four) hours. 05/23/19  Yes [provider]  hyoscyamine (LEVSIN SL) 0.125 MG SL tablet Take 0.125 mg by mouth every 6 (six) hours as needed for cramping or diarrhea or loose stools.   Yes [provider]  levothyroxine (SYNTHROID, LEVOTHROID) 75 MCG tablet Take 75 mcg by mouth daily before breakfast.   Yes  [provider]  Melatonin 10 MG TABS Take 1 tablet by mouth at bedtime.   Yes [provider]  metoprolol tartrate (LOPRESSOR) 25 MG tablet TAKE (1) TABLET TWICE DAILY. Patient taking differently: Take 25 mg by mouth 2 (two) times daily. 04/12/18  Yes Laqueta Linden, MD  MOVANTIK 25 MG TABS tablet Take 25 mg by mouth daily. 09/16/20  Yes [provider]  Multiple Vitamin (MULTIVITAMIN) tablet Take 1 tablet by mouth daily.   Yes [provider]  polyethylene glycol (MIRALAX / GLYCOLAX) 17 g packet Take 17 g by mouth every morning.   Yes [provider]  raloxifene (EVISTA) 60 MG tablet Take 60 mg by mouth daily. 04/13/19  Yes [provider]  tamsulosin (FLOMAX) 0.4 MG CAPS capsule Take 0.4 mg by mouth daily. 04/06/19  Yes [provider]  temazepam (RESTORIL) 30 MG capsule Take 30 mg by mouth at bedtime.   Yes [provider]  traZODone (DESYREL) 50 MG tablet Take 50 mg by mouth at bedtime as needed for sleep. 06/28/19  Yes [provider]  tretinoin (RETIN-A) 0.05 % cream Apply 1 application topically at bedtime.   Yes [provider]  nitroGLYCERIN (NITROSTAT) 0.4 MG SL tablet Place one tablet under tongue every 5 minutes up to 3 doses as needed for chest pain. No more than 3 doses over a 15 minute period. 12/14/13   Luis Abed, MD     Allergies:     Allergies  Allergen Reactions  . Tetracycline Nausea Only     Physical Exam:   Vitals  Blood pressure (!) 121/55, pulse 96, temperature 98.8 F (37.1 C), temperature source Oral, resp. rate 17, height 5\' 2"  (1.575 m), weight 46.7 kg, SpO2 93 %.  Physical Examination: General appearance - alert, well appearing, and in no  distress  Mental status - alert, oriented to person, place, and time,  Eyes - sclera anicteric Neck - supple, no JVD elevation , Chest - clear  to auscultation bilaterally, symmetrical air movement,  Heart - S1 and S2  normal, irregular , 3/6 SM Abdomen - soft, nontender, nondistended, +bs Neurological - screening mental status exam normal, neck supple without rigidity, cranial nerves II through XII intact, DTR's normal and symmetric Extremities - no pedal edema noted, intact peripheral pulses  Skin - warm, dry MSK-left lower extremity is shortened and rotated     Data Review:    CBC Recent Labs  Lab 09/27/20 0919  WBC 13.3*  HGB 10.4*  HCT 31.7*  PLT 175  MCV 94.3  MCH 31.0  MCHC 32.8  RDW 13.5  LYMPHSABS 1.1  MONOABS 1.5*  EOSABS 0.0  BASOSABS 0.0   ------------------------------------------------------------------------------------------------------------------  Chemistries  Recent Labs  Lab 09/27/20 0919  NA 140  K 3.4*  CL 105  CO2 26  GLUCOSE 115*  BUN 15  CREATININE 0.67  CALCIUM 8.2*  AST 27  ALT 19  ALKPHOS 45  BILITOT 0.8   ------------------------------------------------------------------------------------------------------------------ estimated creatinine clearance is 39.3 mL/min (by C-G formula based on SCr of 0.67 mg/dL). ------------------------------------------------------------------------------------------------------------------ No results for input(s): TSH, T4TOTAL, T3FREE, THYROIDAB in the last 72 hours.  Invalid input(s): FREET3   Coagulation profile No results for input(s): INR, PROTIME in the last 168 hours. ------------------------------------------------------------------------------------------------------------------- No results for input(s): DDIMER in the last 72 hours. -------------------------------------------------------------------------------------------------------------------  Cardiac Enzymes No results for input(s): CKMB, TROPONINI, MYOGLOBIN in the last 168 hours.  Invalid input(s): CK ------------------------------------------------------------------------------------------------------------------ No results found for:  BNP   ---------------------------------------------------------------------------------------------------------------  Urinalysis    Component Value Date/Time   COLORURINE AMBER (A) 05/06/2018 1530   APPEARANCEUR CLOUDY (A) 05/06/2018 1530   LABSPEC 1.014 05/06/2018 1530   PHURINE 5.0 05/06/2018 1530   GLUCOSEU NEGATIVE 05/06/2018 1530   HGBUR MODERATE (A) 05/06/2018 1530   BILIRUBINUR NEGATIVE 05/06/2018 1530   KETONESUR NEGATIVE 05/06/2018 1530   PROTEINUR NEGATIVE 05/06/2018 1530   NITRITE NEGATIVE 05/06/2018 1530   LEUKOCYTESUR MODERATE (A) 05/06/2018 1530    ----------------------------------------------------------------------------------------------------------------   Imaging Results:    DG Chest Port 1 View  Result Date: 09/27/2020 CLINICAL DATA:  Larey SeatFell last night, unable to walk, RIGHT hip fracture; history coronary artery disease post MI, COPD, GERD, hypertension EXAM: PORTABLE CHEST 1 VIEW COMPARISON:  08/07/2027 as and 19 FINDINGS: Intraspinal stimulator lower thoracic spine. Normal heart size, mediastinal contours, and pulmonary vascularity. Atherosclerotic calcification aorta. Lungs appear emphysematous but clear. Elevation of RIGHT diaphragm unchanged. No pleural effusion or pneumothorax. Skin fold projects over upper RIGHT chest. Bones demineralized. IMPRESSION: Emphysematous changes without infiltrate. Aortic Atherosclerosis (ICD10-I70.0) and Emphysema (ICD10-J43.9). Electronically Signed   By: Ulyses SouthwardMark  Boles M.D.   On: 09/27/2020 10:08   DG Hip Unilat W or Wo Pelvis 2-3 Views Left  Result Date: 09/27/2020 CLINICAL DATA:  Larey SeatFell last night, LEFT hip pain, unable to walk, has pain stimulator EXAM: DG HIP (WITH OR WITHOUT PELVIS) 2-3V LEFT COMPARISON:  CT abdomen and pelvis 09/06/2019 FINDINGS: Osseous demineralization. Prior lower lumbar fusion. Hip and SI joint spaces preserved. Displaced, angulated, and overriding intertrochanteric fracture LEFT femur. No dislocation. Pain  stimulator projects over upper lateral RIGHT pelvis. Pelvis intact. IMPRESSION: Displaced angulated and overriding intertrochanteric fracture LEFT femur. Prior lumbar fusion. Electronically Signed   By: Ulyses SouthwardMark  Boles M.D.   On: 09/27/2020 10:06    Radiological Exams on Admission: DG Chest Port 1  View  Result Date: 09/27/2020 CLINICAL DATA:  Larey Seat last night, unable to walk, RIGHT hip fracture; history coronary artery disease post MI, COPD, GERD, hypertension EXAM: PORTABLE CHEST 1 VIEW COMPARISON:  08/07/2027 as and 19 FINDINGS: Intraspinal stimulator lower thoracic spine. Normal heart size, mediastinal contours, and pulmonary vascularity. Atherosclerotic calcification aorta. Lungs appear emphysematous but clear. Elevation of RIGHT diaphragm unchanged. No pleural effusion or pneumothorax. Skin fold projects over upper RIGHT chest. Bones demineralized. IMPRESSION: Emphysematous changes without infiltrate. Aortic Atherosclerosis (ICD10-I70.0) and Emphysema (ICD10-J43.9). Electronically Signed   By: Ulyses Southward M.D.   On: 09/27/2020 10:08   DG Hip Unilat W or Wo Pelvis 2-3 Views Left  Result Date: 09/27/2020 CLINICAL DATA:  Larey Seat last night, LEFT hip pain, unable to walk, has pain stimulator EXAM: DG HIP (WITH OR WITHOUT PELVIS) 2-3V LEFT COMPARISON:  CT abdomen and pelvis 09/06/2019 FINDINGS: Osseous demineralization. Prior lower lumbar fusion. Hip and SI joint spaces preserved. Displaced, angulated, and overriding intertrochanteric fracture LEFT femur. No dislocation. Pain stimulator projects over upper lateral RIGHT pelvis. Pelvis intact. IMPRESSION: Displaced angulated and overriding intertrochanteric fracture LEFT femur. Prior lumbar fusion. Electronically Signed   By: Ulyses Southward M.D.   On: 09/27/2020 10:06    DVT Prophylaxis -SCD /heparin AM Labs Ordered, also please review Full Orders  Family Communication: Admission, patients condition and plan of care including tests being ordered have been  discussed with the patient and husband at bedside who indicate understanding and agree with the plan   Code Status - Full Code  Likely DC to transfer to [facility] for Operative Fixation of Lt Hip Fx---likely will need SNF Rehab post op  Condition   Stable   Shon Hale M.D on 09/27/2020 at 12:14 PM Go to [email or web address] -  for contact info  [facility] - Office  [phone]

## 2020-09-27 NOTE — Anesthesia Preprocedure Evaluation (Signed)
Anesthesia Evaluation  Patient identified by MRN, date of birth, ID band Patient awake    Reviewed: Allergy & Precautions, H&P , NPO status , Patient's Chart, lab work & pertinent test results, reviewed documented beta blocker date and time   Airway Mallampati: III  TM Distance: >3 FB Neck ROM: Full    Dental no notable dental hx. (+) Teeth Intact, Dental Advisory Given   Pulmonary shortness of breath, COPD, former smoker,    Pulmonary exam normal breath sounds clear to auscultation       Cardiovascular hypertension, Pt. on medications and Pt. on home beta blockers + CAD, + Past MI and + Cardiac Stents   Rhythm:Regular Rate:Normal     Neuro/Psych negative neurological ROS  negative psych ROS   GI/Hepatic Neg liver ROS, GERD  Medicated,  Endo/Other  negative endocrine ROSHypothyroidism   Renal/GU Renal disease  negative genitourinary   Musculoskeletal  (+) Arthritis , Osteoarthritis,    Abdominal   Peds  Hematology  (+) Blood dyscrasia, anemia ,   Anesthesia Other Findings   Reproductive/Obstetrics negative OB ROS                             Anesthesia Physical Anesthesia Plan  ASA: III  Anesthesia Plan: General   Post-op Pain Management:    Induction: Intravenous  PONV Risk Score and Plan: 4 or greater and Ondansetron, Dexamethasone and Treatment may vary due to age or medical condition  Airway Management Planned: Oral ETT  Additional Equipment:   Intra-op Plan:   Post-operative Plan: Extubation in OR  Informed Consent: I have reviewed the patients History and Physical, chart, labs and discussed the procedure including the risks, benefits and alternatives for the proposed anesthesia with the patient or authorized representative who has indicated his/her understanding and acceptance.     Dental advisory given  Plan Discussed with: CRNA  Anesthesia Plan Comments:          Anesthesia Quick Evaluation

## 2020-09-27 NOTE — Brief Op Note (Signed)
   09/27/2020  7:12 PM  PATIENT:  Mckenzie [name]  85 y.o. female  PRE-OPERATIVE DIAGNOSIS: Left hip fracture l  POST-OPERATIVE DIAGNOSIS: Left hip intertrochanteric/subtrochanteric femur fracture  PROCEDURE:  Procedure(s): INTRAMEDULLARY (IM) NAIL INTERTROCHANTRIC  SURGEON:  Surgeon(s): Cammy Copa, MD  ASSISTANT: magnant pa  ANESTHESIA:   general  EBL: 25 ml    No intake/output data recorded.  BLOOD ADMINISTERED: none  DRAINS: none   LOCAL MEDICATIONS USED: Marcaine morphine clonidine  SPECIMEN:  No Specimen  COUNTS:  YES  TOURNIQUET:  * No tourniquets in log *  DICTATION: .Other Dictation: Dictation Number [id number]  PLAN OF CARE: Admit to inpatient   PATIENT DISPOSITION:  PACU - hemodynamically stable

## 2020-09-27 NOTE — Progress Notes (Signed)
*  PRELIMINARY RESULTS* Echocardiogram 2D Echocardiogram has been performed.  Jeryl Columbia 09/27/2020, 12:46 PM

## 2020-09-27 NOTE — ED Notes (Signed)
Attempted report to [phone] but no answer

## 2020-09-27 NOTE — ED Provider Notes (Signed)
[facility] EMERGENCY DEPARTMENT Provider Note   CSN: [id number] Arrival date & time: 09/27/20  7989     History Chief Complaint  Patient presents with  . Fall    Mckenzie [name] is a 85 y.o. female.  Patient tripped and fell last night.  She did not hit her head.  She was not dizzy.  She complains of pain to left hip  The history is provided by the patient and medical records. No language interpreter was used.  Fall This is a new problem. The current episode started 6 to 12 hours ago. The problem occurs rarely. The problem has been resolved. Pertinent negatives include no chest pain, no abdominal pain and no headaches. Exacerbated by: Movement. Nothing relieves the symptoms. She has tried nothing for the symptoms. The treatment provided no relief.       Past Medical History:  Diagnosis Date  . Aortic valve sclerosis    Mild, echo, April, 2012  . ARDS (adult respiratory distress syndrome) (HCC) 2003   with tracheostomy-peritonitis  . Arthritis   . Back pain    Need for surgery, Dr. Trey Sailors  . CAD (coronary artery disease)    Stent to LAD, 2003 [facility] / nuclear scan April, 201 to artifact / persistent shortness of breath / catheterization Oct 21, 2010... widely patent stent to the mid LAD, no significant obstructive disease, should be low risk    for back surgery  . Carotid arterial disease (HCC)    Moderate-Dr. Myra Gianotti following  . Chronic back pain   . COPD (chronic obstructive pulmonary disease) (HCC)   . Diastolic dysfunction    Echo, April, 2011   EF 65%  . Dysrhythmia    afib  . Ejection fraction    EF 65%, echo, September 15, 2010  . GERD (gastroesophageal reflux disease)   . Hemolytic anemia (HCC)    Followed by Dr. Derald Macleod  . Hypertension   . Hypothyroidism   . Left ventricular hypertrophy    EF 65%, echo, April, 2011  . MR (mitral regurgitation)     Mild echo, 2012 ///  Mild per Echo 2009,  mild, echo, April, 2011  . Myocardial infarction (HCC)   .  Palpitations    Holter, September 16, 2010, PACs and PVCs,one 4 beat run of SVT  . Pneumonia 11-2011  . Preoperative clearance    Preop clearance for back surgery  . Shortness of breath    March, 2012  . UTI (urinary tract infection)    Chronic-on Macrodantin    Patient Active Problem List   Diagnosis Date Noted  . Hip fracture (HCC) 09/27/2020  . Irritable bowel syndrome (IBS) 10/04/2019  . CAP (community acquired pneumonia) 05/06/2018  . Hypersomnolence 05/06/2018  . Acute renal failure (ARF) (HCC) 05/06/2018  . Dehydration 05/06/2018  . Hypotension 05/06/2018  . Current chronic use of systemic steroids 07/04/2015  . Nontoxic multinodular goiter 05/01/2014  . Easy bruisability 08/23/2013  . Occlusion and stenosis of carotid artery without mention of cerebral infarction 03/21/2012  . Hypothyroidism, postsurgical 02/02/2012  . Preoperative clearance   . CAD (coronary artery disease)   . Palpitations   . Ejection fraction   . MR (mitral regurgitation)   . Left ventricular hypertrophy   . Aortic valve sclerosis   . Carotid arterial disease (HCC)   . GERD (gastroesophageal reflux disease)   . Hypothyroidism   . ARDS (adult respiratory distress syndrome) (HCC)   . Hemolytic anemia (HCC)   . Diastolic dysfunction   .  Shortness of breath   . Back pain   . Hyperlipidemia 09/10/2009  . GERD 09/10/2009    Past Surgical History:  Procedure Laterality Date  . APPENDECTOMY    . BACK SURGERY    . BREAST LUMPECTOMY Left   . CATARACT EXTRACTION W/PHACO Right 12/05/2012   Procedure: CATARACT EXTRACTION PHACO AND INTRAOCULAR LENS PLACEMENT (IOC);  Surgeon: Gemma PayorKerry Hunt, MD;  Location: [facility];  Service: Ophthalmology;  Laterality: Right;  CDE:15.30  . CATARACT EXTRACTION W/PHACO Left 12/15/2012   Procedure: CATARACT EXTRACTION PHACO AND INTRAOCULAR LENS PLACEMENT (IOC);  Surgeon: Gemma PayorKerry Hunt, MD;  Location: [facility];  Service: Ophthalmology;  Laterality: Left;  CDE:22.56  . CORONARY ANGIOPLASTY  WITH STENT PLACEMENT  2003  . repair of ruptured lumbar discs  12-2011  . SPINE SURGERY     lumbar disk [facility] Laur[facility]  . THYROID SURGERY  08/2009   Benign adenomas  . TONSILLECTOMY    . TRACHEOSTOMY  2003   Following ARDS      OB History   No obstetric history on file.     Family History  Problem Relation Age of Onset  . Heart failure Father   . Diabetes Son     Social History   Tobacco Use  . Smoking status: Former Smoker    Packs/day: 1.00    Years: 20.00    Pack years: 20.00    Types: Cigarettes    Quit date: 06/15/2000    Years since quitting: 20.2  . Smokeless tobacco: Never Used  Vaping Use  . Vaping Use: Never used  Substance Use Topics  . Alcohol use: No  . Drug use: No    Home Medications Prior to Admission medications   Medication Sig Start Date End Date Taking? Authorizing Provider  atorvastatin (LIPITOR) 10 MG tablet Take 10 mg by mouth daily.  07/22/12  Yes [provider]  benazepril (LOTENSIN) 5 MG tablet Take 5 mg by mouth daily.   Yes [provider]  Calcium Carbonate (CALCIUM 600 PO) Take 1,200 mg by mouth daily.   Yes [provider]  Cholecalciferol (VITAMIN D3) 1000 UNITS CAPS Take 1,000 Units by mouth daily. Take one tab daily   Yes [provider]  conjugated estrogens (PREMARIN) vaginal cream Place 1 Applicatorful vaginally daily.   Yes [provider]  diphenhydrAMINE (BENADRYL) 25 mg capsule Take 25 mg by mouth daily as needed for sleep.   Yes [provider]  esomeprazole (NEXIUM) 40 MG capsule Take 40 mg by mouth daily before breakfast.   Yes [provider]  HYDROcodone-acetaminophen (NORCO) 10-325 MG tablet Take 1 tablet by mouth every 4 (four) hours. 05/23/19  Yes [provider]  hyoscyamine (LEVSIN SL) 0.125 MG SL tablet Take 0.125 mg by mouth every 6 (six) hours as needed for cramping or diarrhea or loose stools.   Yes [provider]  levothyroxine (SYNTHROID, LEVOTHROID) 75 MCG tablet Take 75 mcg by mouth daily before breakfast.   Yes [provider]  Melatonin 10 MG TABS Take 1 tablet by mouth at bedtime.   Yes [provider]  metoprolol tartrate (LOPRESSOR) 25 MG tablet TAKE (1) TABLET TWICE DAILY. Patient taking differently: Take 25 mg by mouth 2 (two) times daily. 04/12/18  Yes Laqueta LindenKoneswaran, Suresh A, MD  MOVANTIK 25 MG TABS tablet Take 25 mg by mouth daily. 09/16/20  Yes [provider]  Multiple Vitamin (MULTIVITAMIN) tablet Take 1 tablet by mouth daily.  Yes [provider]  polyethylene glycol (MIRALAX / GLYCOLAX) 17 g packet Take 17 g by mouth every morning.   Yes [provider]  raloxifene (EVISTA) 60 MG tablet Take 60 mg by mouth daily. 04/13/19  Yes [provider]  tamsulosin (FLOMAX) 0.4 MG CAPS capsule Take 0.4 mg by mouth daily. 04/06/19  Yes [provider]  temazepam (RESTORIL) 30 MG capsule Take 30 mg by mouth at bedtime.   Yes [provider]  traZODone (DESYREL) 50 MG tablet Take 50 mg by mouth at bedtime as needed for sleep. 06/28/19  Yes [provider]  tretinoin (RETIN-A) 0.05 % cream Apply 1 application topically at bedtime.   Yes [provider]  nitroGLYCERIN (NITROSTAT) 0.4 MG SL tablet Place one tablet under tongue every 5 minutes up to 3 doses as needed for chest pain. No more than 3 doses over a 15 minute period. 12/14/13   Luis Abed, MD    Allergies    Tetracycline  Review of Systems   Review of Systems  Constitutional: Negative for appetite change and fatigue.  HENT: Negative for congestion, ear discharge and sinus pressure.   Eyes: Negative for discharge.  Respiratory: Negative for cough.   Cardiovascular: Negative for chest pain.  Gastrointestinal: Negative for abdominal pain and diarrhea.  Genitourinary: Negative for frequency and hematuria.  Musculoskeletal: Negative for  back pain.       Left hip pain  Skin: Negative for rash.  Neurological: Negative for seizures and headaches.  Psychiatric/Behavioral: Negative for hallucinations.    Physical Exam Updated Vital Signs BP 99/68   Pulse (!) 54   Temp 98.8 F (37.1 C) (Oral)   Resp (!) 21   Ht 5\' 2"  (1.575 m)   Wt 46.7 kg   SpO2 97%   BMI 18.84 kg/m   Physical Exam Vitals reviewed.  Constitutional:      Appearance: She is well-developed.  HENT:     Head: Normocephalic.     Nose: Nose normal.  Eyes:     General: No scleral icterus.    Conjunctiva/sclera: Conjunctivae normal.  Neck:     Thyroid: No thyromegaly.  Cardiovascular:     Rate and Rhythm: Normal rate and regular rhythm.     Heart sounds: No murmur heard. No friction rub. No gallop.   Pulmonary:     Breath sounds: No stridor. No wheezing or rales.  Chest:     Chest wall: No tenderness.  Abdominal:     General: There is no distension.     Tenderness: There is no abdominal tenderness. There is no rebound.  Musculoskeletal:     Cervical back: Normal range of motion and neck supple.     Comments: Tender left hip  Lymphadenopathy:     Cervical: No cervical adenopathy.  Skin:    Findings: No erythema or rash.  Neurological:     Mental Status: She is alert and oriented to person, place, and time.     Motor: No abnormal muscle tone.     Coordination: Coordination normal.  Psychiatric:        Behavior: Behavior normal.     ED Results / Procedures / Treatments   Labs (all labs ordered are listed, but only abnormal results are displayed) Labs Reviewed  CBC WITH DIFFERENTIAL/PLATELET - Abnormal; Notable for the following components:      Result Value   WBC 13.3 (*)    RBC 3.36 (*)    Hemoglobin 10.4 (*)  HCT 31.7 (*)    Neutro Abs 10.6 (*)    Monocytes Absolute 1.5 (*)    All other components within normal limits  COMPREHENSIVE METABOLIC PANEL - Abnormal; Notable for the following components:   Potassium 3.4 (*)     Glucose, Bld 115 (*)    Calcium 8.2 (*)    Total Protein 5.1 (*)    Albumin 3.2 (*)    All other components within normal limits  RESP PANEL BY RT-PCR (FLU A&B, COVID) [id number]    EKG None  Radiology DG Chest Port 1 View  Result Date: 09/27/2020 CLINICAL DATA:  Larey Seat last night, unable to walk, RIGHT hip fracture; history coronary artery disease post MI, COPD, GERD, hypertension EXAM: PORTABLE CHEST 1 VIEW COMPARISON:  08/07/2027 as and 19 FINDINGS: Intraspinal stimulator lower thoracic spine. Normal heart size, mediastinal contours, and pulmonary vascularity. Atherosclerotic calcification aorta. Lungs appear emphysematous but clear. Elevation of RIGHT diaphragm unchanged. No pleural effusion or pneumothorax. Skin fold projects over upper RIGHT chest. Bones demineralized. IMPRESSION: Emphysematous changes without infiltrate. Aortic Atherosclerosis (ICD10-I70.0) and Emphysema (ICD10-J43.9). Electronically Signed   By: Ulyses Southward M.D.   On: 09/27/2020 10:08   DG Hip Unilat W or Wo Pelvis 2-3 Views Left  Result Date: 09/27/2020 CLINICAL DATA:  Larey Seat last night, LEFT hip pain, unable to walk, has pain stimulator EXAM: DG HIP (WITH OR WITHOUT PELVIS) 2-3V LEFT COMPARISON:  CT abdomen and pelvis 09/06/2019 FINDINGS: Osseous demineralization. Prior lower lumbar fusion. Hip and SI joint spaces preserved. Displaced, angulated, and overriding intertrochanteric fracture LEFT femur. No dislocation. Pain stimulator projects over upper lateral RIGHT pelvis. Pelvis intact. IMPRESSION: Displaced angulated and overriding intertrochanteric fracture LEFT femur. Prior lumbar fusion. Electronically Signed   By: Ulyses Southward M.D.   On: 09/27/2020 10:06    Procedures Procedures   Medications Ordered in ED Medications  HYDROmorphone (DILAUDID) injection 0.5 mg (0.5 mg Intravenous Given 09/27/20 1030)    ED Course  I have reviewed the triage vital signs and the nursing notes.  Pertinent labs & imaging results that  were available during my care of the patient were reviewed by me and considered in my medical decision making (see chart for details). Patient with a left hip fracture.  I spoke with orthopedic surgeon Dr. August Saucer and he would like to keep the patient n.p.o. and transfer her to [facility] to do her surgery today.   MDM Rules/Calculators/A&P      Patient with a hip fracture from mechanical fall.  She will be admitted to [facility] with orthopedic consult                     Final Clinical Impression(s) / ED Diagnoses Final diagnoses:  Fall    Rx / DC Orders ED Discharge Orders    None       Bethann Berkshire, MD 09/27/20 1113

## 2020-09-28 DIAGNOSIS — E876 Hypokalemia: Secondary | ICD-10-CM

## 2020-09-28 DIAGNOSIS — D62 Acute posthemorrhagic anemia: Secondary | ICD-10-CM

## 2020-09-28 LAB — TSH: TSH: 0.313 u[IU]/mL — ABNORMAL LOW (ref 0.350–4.500)

## 2020-09-28 LAB — BASIC METABOLIC PANEL
Anion gap: 6 (ref 5–15)
BUN: 14 mg/dL (ref 8–23)
CO2: 24 mmol/L (ref 22–32)
Calcium: 7.5 mg/dL — ABNORMAL LOW (ref 8.9–10.3)
Chloride: 107 mmol/L (ref 98–111)
Creatinine, Ser: 0.73 mg/dL (ref 0.44–1.00)
GFR, Estimated: >60 mL/min (ref >60)
Glucose, Bld: 191 mg/dL — ABNORMAL HIGH (ref 70–99)
Potassium: 3.3 mmol/L — ABNORMAL LOW (ref 3.5–5.1)
Sodium: 137 mmol/L (ref 135–145)

## 2020-09-28 LAB — CBC
HCT: 19.6 % — ABNORMAL LOW (ref 36.0–46.0)
Hemoglobin: 6.4 g/dL — CL (ref 12.0–15.0)
MCH: 30.6 pg (ref 26.0–34.0)
MCHC: 32.7 g/dL (ref 30.0–36.0)
MCV: 93.8 fL (ref 80.0–100.0)
Platelets: 126 10*3/uL — ABNORMAL LOW (ref 150–400)
RBC: 2.09 MIL/uL — ABNORMAL LOW (ref 3.87–5.11)
RDW: 13.8 % (ref 11.5–15.5)
WBC: 8.8 10*3/uL (ref 4.0–10.5)
nRBC: 0 % (ref 0.0–0.2)

## 2020-09-28 LAB — ABO/RH: ABO/RH(D): O NEG

## 2020-09-28 LAB — PREPARE RBC (CROSSMATCH)

## 2020-09-28 LAB — GLUCOSE, CAPILLARY
Glucose-Capillary: 134 mg/dL — ABNORMAL HIGH (ref 70–99)
Glucose-Capillary: 151 mg/dL — ABNORMAL HIGH (ref 70–99)

## 2020-09-28 MED ORDER — SODIUM CHLORIDE 0.9% IV SOLUTION: Freq: Once | INTRAVENOUS | Status: AC

## 2020-09-28 MED ORDER — POTASSIUM CHLORIDE CRYS ER 20 MEQ PO TBCR
40.0000 meq | EXTENDED_RELEASE_TABLET | Freq: Once | ORAL | Status: AC
  Administered 2020-09-28: 40 meq via ORAL
  Filled 2020-09-28: qty 2

## 2020-09-28 MED ORDER — SENNOSIDES-DOCUSATE SODIUM 8.6-50 MG PO TABS
1.0000 | ORAL_TABLET | Freq: Two times a day (BID) | ORAL | Status: DC
  Filled 2020-09-28: qty 1

## 2020-09-28 MED ORDER — METOPROLOL TARTRATE 12.5 MG HALF TABLET
12.5000 mg | ORAL_TABLET | Freq: Two times a day (BID) | ORAL | Status: DC
  Administered 2020-09-28 – 2020-10-01 (×7): 12.5 mg via ORAL
  Filled 2020-09-28 (×7): qty 1

## 2020-09-28 MED ORDER — CHLORHEXIDINE GLUCONATE CLOTH 2 % EX PADS
6.0000 | MEDICATED_PAD | Freq: Every day | CUTANEOUS | Status: DC
  Administered 2020-09-28 – 2020-10-03 (×6): 6 via TOPICAL

## 2020-09-28 NOTE — Progress Notes (Signed)
PROGRESS NOTE    Mckenzie [name]  WUJ:[id number] DOB: December 05, 1935 DOA: 09/27/2020 PCP: Royann Shivers, PA-C    Chief Complaint  Patient presents with  . Fall    Brief Narrative:  Mckenzie [name]  is a 85 y.o. female 85 year old with history of CAD status post prior stenting 2003, COPD, GERD, postsurgical hypothyroidism, history of mild aortic stenosis, mild MR, as well as IBS presents to the ED with left hip pain after falling around midnight yesterday -Both patient and husband states that this was a mechanical fall -Denies syncope, denies chest pains palpitations dizziness or shortness of breath before or after fall -Denies seizures -In the ED hip x-rays demonstrate displaced angulated and overriding intertrochanteric femur fracture on the left-- She is transferred from [facility] to [facility] for surgery  Subjective:   POD#1, hgb 6.4 this am, vitals signs are stable, she is to get prbc transfusion this am Husband at bedside   Assessment & Plan:   Principal Problem:   Lt Hip Hip fracture -s/p Mechanical Fall Active Problems:   GERD   Mild Aortic valve sclerosis   CAD (coronary artery disease)/Stent to LAD, 2003 [facility]   Hypothyroidism, postsurgical   Irritable bowel syndrome (IBS)   COPD (chronic obstructive pulmonary disease) (HCC)   HTN (hypertension)  Left hip intertrochanteric/subtrochanteric femur fracture from mechanical fall at home S/p Left hip IMN On 4/15 touchdown weightbearing left lower extremity as tolerated Postop wound care, pain management and DVT prophylaxis per Ortho, appreciate Ortho input  Postop blood loss anemia Hemoglobin 10.4 yesterday, hemoglobin 6.4 today 2 PRBC transfusion today Repeat CBC in the morning  Leukocytosis WBC 13.3 on presentation, likely reactive WBC normalized Monitor  HTN bp low normal, decrease lopressor , add on holding parameters Continue hold benazepril  Hypokalemia  replace, repeat lab in am, check  mag     CAD with prior stent to LAD in 2003-no chest pains, no ACS type symptoms  -EKG without acute concerns -continue Lipitor, aspirin and metoprolol  Heart Murmur--history of mild AS and mild MR, echo from November 2019 noted with preserved EF of 60 to 65%--- repeat echo on 4/15 , no significant interval changes except "A small pericardial effusion is present. There is no evidence of cardiac tamponade. "  She denies chest pain  COPD-chest x-ray with emphysema, no acute exacerbation--continue bronchodilators   chronic bladder outlet obstruction  Indwelling foley, follows [facility]  IBS with predominant constipation----continue MiraLAX, hold Movantik, hold Levsin  GERD-stable, continue PPI   postsurgical hypothyroidism--- continue levothyroxine, check TSH    Body mass index is 18.84 kg/m.Marland Kitchen       Unresulted Labs (From admission, onward)          Start     Ordered   09/29/20 0500  Basic metabolic panel  Tomorrow morning,   R        09/28/20 1219   09/29/20 0500  Magnesium  Tomorrow morning,   R        09/28/20 1219   09/29/20 0500  Hepatic function panel  Tomorrow morning,   R        09/28/20 1605   09/28/20 1605  Urinalysis, Routine w reflex microscopic  Once,   R        09/28/20 1604   09/28/20 0500  CBC  Daily,   R      09/27/20 2043            DVT prophylaxis: heparin injection 5,000 Units Start:  09/28/20 1800 Place TED hose Start: 09/27/20 2044 SCDs Start: 09/27/20 1938 SCDs Start: 09/27/20 1112 Place TED hose Start: 09/27/20 1112   Code Status: Full Family Communication: Husband at bedside Disposition:   Status is: Inpatient  Dispo: The patient is from: Home              Anticipated d/c is to: Likely need skilled nursing facility              Anticipated d/c date is: Hopefully can discharge on Monday                Consultants:   Orthopedics  Procedures:   IMN left hip on 4/15 by Dr August Saucer  Antimicrobials:    Anti-infectives  (From admission, onward)   Start     Dose/Rate Route Frequency Ordered Stop   09/28/20 0600  ceFAZolin (ANCEF) IVPB 2g/100 mL premix  Status:  Discontinued        2 g 200 mL/hr over 30 Minutes Intravenous On call to O.R. 09/27/20 1522 09/27/20 2038   09/27/20 2200  ceFAZolin (ANCEF) IVPB 2g/100 mL premix        2 g 200 mL/hr over 30 Minutes Intravenous Every 8 hours 09/27/20 2043 09/28/20 0751         Objective: Vitals:   09/28/20 1343 09/28/20 1437 09/28/20 1532 09/28/20 1547  BP: (!) 115/54 (!) 124/96 (!) 112/41 (!) 104/41  Pulse: 80 92 90 83  Resp:  Temp:  (!) 97 F (36.1 C) (!) 97 F (36.1 C) (!) 97.1 F (36.2 C)  TempSrc:  Axillary Axillary Axillary  SpO2:  99% 99% 98%  Weight:      Height:        Intake/Output Summary (Last 24 hours) at 09/28/2020 1605 Last data filed at 09/28/2020 1430 Gross per 24 hour  Intake 2767.5 ml  Output 900 ml  Net 1867.5 ml   Filed Weights   09/27/20 0909  Weight: 46.7 kg    Examination:  General exam: frail and pale, hard of hearing, but calm, NAD Respiratory system: Clear to auscultation. Respiratory effort normal. Cardiovascular system: S1 & S2 heard, RRR. Soft  Murmur 1-2/6, No pedal edema. Gastrointestinal system: Abdomen is nondistended, soft and nontender.  Normal bowel sounds heard. Central nervous system: Alert and oriented. No focal neurological deficits. Extremities: left hip post op changes  Skin: No rashes, lesions or ulcers Psychiatry: Judgement and insight appear normal. Mood & affect appropriate.     Data Reviewed: I have personally reviewed following labs and imaging studies  CBC: Recent Labs  Lab 09/27/20 0919 09/28/20 0139  WBC 13.3* 8.8  NEUTROABS 10.6*  --   HGB 10.4* 6.4*  HCT 31.7* 19.6*  MCV 94.3 93.8  PLT 175 126*    Basic Metabolic Panel: Recent Labs  Lab 09/27/20 0919 09/28/20 0139  NA 140 137  K 3.4* 3.3*  CL 105 107  CO2 26 24  GLUCOSE 115* 191*  BUN 15 14   CREATININE 0.67 0.73  CALCIUM 8.2* 7.5*    GFR: Estimated Creatinine Clearance: 39.3 mL/min (by C-G formula based on SCr of 0.73 mg/dL).  Liver Function Tests: Recent Labs  Lab 09/27/20 0919  AST 27  ALT 19  ALKPHOS 45  BILITOT 0.8  PROT 5.1*  ALBUMIN 3.2*    CBG: No results for input(s): GLUCAP in the last 168 hours.   Recent Results (from the past 240 hour(s))  Resp Panel by RT-PCR (Flu A&B,  Covid) Nasopharyngeal Swab     Status: None   Collection Time: 09/27/20 11:26 AM   Specimen: Nasopharyngeal Swab; Nasopharyngeal(NP) swabs in vial transport medium  Result Value Ref Range Status   SARS Coronavirus 2 by RT PCR NEGATIVE NEGATIVE Final    Comment: (NOTE) SARS-CoV-2 target nucleic acids are NOT DETECTED.  The SARS-CoV-2 RNA is generally detectable in upper respiratory specimens during the acute phase of infection. The lowest concentration of SARS-CoV-2 viral copies this assay can detect is 138 copies/mL. A negative result does not preclude SARS-Cov-2 infection and should not be used as the sole basis for treatment or other patient management decisions. A negative result may occur with  improper specimen collection/handling, submission of specimen other than nasopharyngeal swab, presence of viral mutation(s) within the areas targeted by this assay, and inadequate number of viral copies(<138 copies/mL). A negative result must be combined with clinical observations, patient history, and epidemiological information. The expected result is Negative.  Fact Sheet for Patients:  BloggerCourse.com  Fact Sheet for Healthcare Providers:  SeriousBroker.it  This test is no t yet approved or cleared by the [place] FDA and  has been authorized for detection and/or diagnosis of SARS-CoV-2 by FDA under an Emergency Use Authorization (EUA). This EUA will remain  in effect (meaning this test can be used) for the duration of  the COVID-19 declaration under Section 564(b)(1) of the Act, 21 U.S.C.section 360bbb-3(b)(1), unless the authorization is terminated  or revoked sooner.       Influenza A by PCR NEGATIVE NEGATIVE Final   Influenza B by PCR NEGATIVE NEGATIVE Final    Comment: (NOTE) The Xpert Xpress SARS-CoV-2/FLU/RSV plus assay is intended as an aid in the diagnosis of influenza from Nasopharyngeal swab specimens and should not be used as a sole basis for treatment. Nasal washings and aspirates are unacceptable for Xpert Xpress SARS-CoV-2/FLU/RSV testing.  Fact Sheet for Patients: BloggerCourse.com  Fact Sheet for Healthcare Providers: SeriousBroker.it  This test is not yet approved or cleared by the [place] FDA and has been authorized for detection and/or diagnosis of SARS-CoV-2 by FDA under an Emergency Use Authorization (EUA). This EUA will remain in effect (meaning this test can be used) for the duration of the COVID-19 declaration under Section 564(b)(1) of the Act, 21 U.S.C. section 360bbb-3(b)(1), unless the authorization is terminated or revoked.  Performed at [facility], [address] [place], [place] [address]          Radiology Studies: Pelvis Portable  Result Date: 09/27/2020 CLINICAL DATA:  Post left hip fracture EXAM: PORTABLE PELVIS 1-2 VIEWS COMPARISON:  01/27/2021 FINDINGS: Internal fixation within the left femur with intramedullary nail and dynamic hip screw. Anatomic alignment. No hardware complicating feature. Mild degenerative changes in the hips bilaterally. IMPRESSION: Internal fixation across the left proximal femoral fracture. No complicating feature. Electronically Signed   By: Charlett Nose M.D.   On: 09/27/2020 19:48   DG Chest Port 1 View  Result Date: 09/27/2020 CLINICAL DATA:  Larey Seat last night, unable to walk, RIGHT hip fracture; history coronary artery disease post MI, COPD, GERD, hypertension EXAM:  PORTABLE CHEST 1 VIEW COMPARISON:  08/07/2027 as and 19 FINDINGS: Intraspinal stimulator lower thoracic spine. Normal heart size, mediastinal contours, and pulmonary vascularity. Atherosclerotic calcification aorta. Lungs appear emphysematous but clear. Elevation of RIGHT diaphragm unchanged. No pleural effusion or pneumothorax. Skin fold projects over upper RIGHT chest. Bones demineralized. IMPRESSION: Emphysematous changes without infiltrate. Aortic Atherosclerosis (ICD10-I70.0) and Emphysema (ICD10-J43.9). Electronically Signed  By: Ulyses Southward M.D.   On: 09/27/2020 10:08   DG C-Arm 1-60 Min  Result Date: 09/27/2020 CLINICAL DATA:  Intramedullary nail left femur. EXAM: DG C-ARM 1-60 MIN; LEFT FEMUR 2 VIEWS FLUOROSCOPY TIME:  Fluoroscopy Time:  1 minutes and 50 seconds. Radiation Exposure Index (if provided by the fluoroscopic device): 7.61 mGy. Number of Acquired Spot Images: 6 COMPARISON:  September 27, 2020. FINDINGS: Six C-arm fluoroscopic images were obtained intraoperatively and submitted for post operative interpretation. These images demonstrate intramedullary nail and screw fixation of a left femoral neck fracture with improved, near anatomic alignment. Please see the performing provider's procedural report for further detail. IMPRESSION: Intraoperative fluoroscopy, as detailed above. Electronically Signed   By: Feliberto Harts MD   On: 09/27/2020 18:42   ECHOCARDIOGRAM COMPLETE  Result Date: 09/27/2020    ECHOCARDIOGRAM REPORT   Patient Name:   DESERAY DAPONTE Date of Exam: 09/27/2020 Medical Rec #:  [id number]         Height:       62.0 in Accession #:    [id number]        Weight:       103.0 lb Date of Birth:  04-14-1936         BSA:          1.442 m Patient Age:    85 years          BP:           123/40 mmHg Patient Gender: F                 HR:           103 bpm. [facility] Procedure: 2D Echo, Cardiac Doppler and Color Doppler Indications:    Abnormal ECG R94.31  History:         Patient has no prior history of Echocardiogram examinations.                 CAD; Risk Factors:Hypertension and Dyslipidemia.  Sonographer:    Jeryl Columbia RDCS (AE) Referring Phys: [id number] COURAGE EMOKPAE IMPRESSIONS  1. Left ventricular ejection fraction, by estimation, is 65 to 70%. The left ventricle has normal function. The left ventricle has no regional wall motion abnormalities. There is moderate concentric left ventricular hypertrophy. Left ventricular diastolic parameters are indeterminate.  2. Right ventricular systolic function is normal. The right ventricular size is normal.  3. A small pericardial effusion is present. There is no evidence of cardiac tamponade.  4. The mitral valve is grossly normal. Trivial mitral valve regurgitation. No evidence of mitral stenosis. Moderate to severe mitral annular calcification.  5. The aortic valve is grossly normal. Aortic valve regurgitation is not visualized. Mild aortic valve stenosis.  6. The inferior vena cava is normal in size with greater than 50% respiratory variability, suggesting right atrial pressure of 3 mmHg. Comparison(s): Prior images unable to be directly viewed, comparison made by report only. Conclusion(s)/Recommendation(s): Otherwise normal echocardiogram, with minor abnormalities described in the report. FINDINGS  Left Ventricle: Left ventricular ejection fraction, by estimation, is 65 to 70%. The left ventricle has normal function. The left ventricle has no regional wall motion abnormalities. The left ventricular internal cavity size was normal in size. There is  moderate concentric left ventricular hypertrophy. Left ventricular diastolic parameters are indeterminate. Right Ventricle: The right ventricular size is normal. Right vetricular wall thickness was not well visualized. Right ventricular systolic function is normal. Left Atrium: Left atrial size was  normal in size. Right Atrium: Right atrial size was normal in size. Pericardium: A  small pericardial effusion is present. There is no evidence of cardiac tamponade. Presence of pericardial fat pad. Mitral Valve: The mitral valve is grossly normal. Moderate to severe mitral annular calcification. Trivial mitral valve regurgitation. No evidence of mitral valve stenosis. Tricuspid Valve: The tricuspid valve is normal in structure. Tricuspid valve regurgitation is trivial. No evidence of tricuspid stenosis. Aortic Valve: The aortic valve is grossly normal. Aortic valve regurgitation is not visualized. Mild aortic stenosis is present. Aortic valve mean gradient measures 14.3 mmHg. Aortic valve peak gradient measures 31.9 mmHg. Aortic valve area, by VTI measures 2.40 cm. Pulmonic Valve: The pulmonic valve was not well visualized. Pulmonic valve regurgitation is not visualized. Aorta: The aortic arch was not well visualized, the ascending aorta was not well visualized and the aortic root was not well visualized. Venous: The inferior vena cava is normal in size with greater than 50% respiratory variability, suggesting right atrial pressure of 3 mmHg. IAS/Shunts: The interatrial septum was not well visualized.  LEFT VENTRICLE PLAX 2D LVOT diam:     1.90 cm  Diastology LV SV:         97       LV e' medial:    5.44 cm/s LV SV Index:   67       LV E/e' medial:  14.3 LVOT Area:     2.84 cm LV e' lateral:   3.81 cm/s                         LV E/e' lateral: 20.4  RIGHT VENTRICLE RV S prime:     12.90 cm/s TAPSE (M-mode): 1.3 cm LEFT ATRIUM             Index       RIGHT ATRIUM          Index LA Vol (A2C):   25.7 ml 17.82 ml/m RA Area:     5.68 cm LA Vol (A4C):   19.4 ml 13.45 ml/m RA Volume:   8.92 ml  6.19 ml/m LA Biplane Vol: 22.7 ml 15.74 ml/m  AORTIC VALVE AV Area (Vmax):    1.82 cm AV Area (Vmean):   2.18 cm AV Area (VTI):     2.40 cm AV Vmax:           282.33 cm/s AV Vmean:          164.000 cm/s AV VTI:            0.402 m AV Peak Grad:      31.9 mmHg AV Mean Grad:      14.3 mmHg LVOT Vmax:          181.67 cm/s LVOT Vmean:        126.000 cm/s LVOT VTI:          0.341 m LVOT/AV VTI ratio: 0.85 MITRAL VALVE MV Area (PHT): 2.91 cm     SHUNTS MV Decel Time: 261 msec     Systemic VTI:  0.34 m MV E velocity: 77.60 cm/s   Systemic Diam: 1.90 cm MV A velocity: 129.00 cm/s MV E/A ratio:  0.60 Jodelle RedBridgette Christopher MD Electronically signed by Jodelle RedBridgette Christopher MD Signature Date/Time: 09/27/2020/4:18:47 PM    Final    DG Hip Unilat W or Wo Pelvis 2-3 Views Left  Result Date: 09/27/2020 CLINICAL DATA:  Larey SeatFell last night, LEFT hip pain, unable to walk, has pain  stimulator EXAM: DG HIP (WITH OR WITHOUT PELVIS) 2-3V LEFT COMPARISON:  CT abdomen and pelvis 09/06/2019 FINDINGS: Osseous demineralization. Prior lower lumbar fusion. Hip and SI joint spaces preserved. Displaced, angulated, and overriding intertrochanteric fracture LEFT femur. No dislocation. Pain stimulator projects over upper lateral RIGHT pelvis. Pelvis intact. IMPRESSION: Displaced angulated and overriding intertrochanteric fracture LEFT femur. Prior lumbar fusion. Electronically Signed   By: Ulyses Southward M.D.   On: 09/27/2020 10:06   DG FEMUR MIN 2 VIEWS LEFT  Result Date: 09/27/2020 CLINICAL DATA:  Intramedullary nail left femur. EXAM: DG C-ARM 1-60 MIN; LEFT FEMUR 2 VIEWS FLUOROSCOPY TIME:  Fluoroscopy Time:  1 minutes and 50 seconds. Radiation Exposure Index (if provided by the fluoroscopic device): 7.61 mGy. Number of Acquired Spot Images: 6 COMPARISON:  September 27, 2020. FINDINGS: Six C-arm fluoroscopic images were obtained intraoperatively and submitted for post operative interpretation. These images demonstrate intramedullary nail and screw fixation of a left femoral neck fracture with improved, near anatomic alignment. Please see the performing provider's procedural report for further detail. IMPRESSION: Intraoperative fluoroscopy, as detailed above. Electronically Signed   By: Feliberto Harts MD   On: 09/27/2020 18:42        Scheduled  Meds: . aspirin  81 mg Oral BID  . atorvastatin  10 mg Oral Daily  . calcium-vitamin D  2 tablet Oral Daily  . Chlorhexidine Gluconate Cloth  6 each Topical Daily  . cholecalciferol  1,000 Units Oral Daily  . conjugated estrogens  1 Applicatorful Vaginal Daily  . heparin  5,000 Units Subcutaneous Q8H  . levothyroxine  75 mcg Oral QAC breakfast  . metoprolol tartrate  12.5 mg Oral BID  . mometasone-formoterol  2 puff Inhalation BID  . multivitamin with minerals  1 tablet Oral Daily  . pantoprazole  40 mg Oral Daily  . polyethylene glycol  17 g Oral BID  . raloxifene  60 mg Oral Daily  . senna-docusate  1 tablet Oral BID  . sodium chloride flush  3 mL Intravenous Q12H  . sodium chloride flush  3 mL Intravenous Q12H  . tamsulosin  0.4 mg Oral Daily  . temazepam  30 mg Oral QHS   Continuous Infusions: . sodium chloride    . methocarbamol (ROBAXIN) IV       LOS: 1 day   Time spent: Greater than 50% of this time was spent in counseling, explanation of diagnosis, planning of further management, and coordination of care.   Voice Recognition Reubin Milan dictation system was used to create this note, attempts have been made to correct errors. Please contact the author with questions and/or clarifications.   Albertine Grates, MD PhD FACP [facility]  Available via Epic secure chat 7am-7pm for nonurgent issues Please page for urgent issues To page the attending provider between 7A-7P or the covering provider during after hours 7P-7A, please log into the web site [email or web address] and access using universal [place] password for that web site. If you do not have the password, please call the hospital operator.    09/28/2020, 4:05 PM

## 2020-09-28 NOTE — Op Note (Signed)
NAME: Mckenzie [name], Mckenzie [name] MEDICAL RECORD NO: [id number] ACCOUNT NO: [id number] DATE OF BIRTH: 1935-08-20 FACILITY: [facility] LOCATION: [facility] PHYSICIAN: Graylin Shiver. August Saucer, MD  Operative Report   PREOPERATIVE DIAGNOSIS:  Left hip intertrochanteric/subtrochanteric femur fracture.  POSTOPERATIVE DIAGNOSIS:  Left hip intertrochanteric/subtrochanteric femur fracture.  PROCEDURE:  Left hip IMHS placement for hip/femur fracture using Smith and Nephew 10 x 38 mm IMHS InterTAN.  One distal interlocking screw.  PROCEDURE IN DETAIL: The patient was brought to the operating room where general anesthetic was induced.  Preoperative antibiotics administered.  Timeout was called.  Left leg was placed under traction, and under fluoroscopic guidance, the fracture was  reduced.  Right leg placed in the lithotomy position with the peroneal nerve well padded.  Left hip region was prescrubbed with alcohol and Betadine, allowed to air dry, prepped with DuraPrep solution and draped in a sterile manner.  Ioban used to cover  the operative field.  Timeout was called.  Under fluoroscopic guidance, fracture reduction was optimized. Nail size was optimized.  The patient was a small female and thus reaming was required.  Guide pin placed in the tip of the trochanter down across  the reduced fracture.  Proximal reaming was performed.  A guide pin was then placed down the length of the femur and reaming performed up to 11.5 mm.  A 38 x 10 mm nail was placed and the compression screw and the secondary screw were placed with good  compression achieved.  One distal interlocking screw was then placed under fluoroscopic guidance for rotational stability because there was a subtrochanteric extension to this fracture.  All in all, the alignment was intact and looked good.  Tip apex  distance approximately 5 mm.  Bone quality was also good.  All incisions irrigated and closed using 0 Vicryl suture, 2-0 Vicryl suture, and 3-0 nylon.  Anesthetic  agent including Marcaine, morphine, clonidine injected into the incision for postoperative  pain relief.  The patient tolerated the procedure well without immediate complications, was transferred to the recovery room in stable condition.  Luke's assistance was required at all times for mobilization of the tissue, opening, closing, fracture  reduction, nail placement.  His assistance was a medical necessity.    PAA D: 09/27/2020 7:17:32 pm T: 09/28/2020 5:19:00 am  JOB: [id number]/ [id number]

## 2020-09-28 NOTE — Progress Notes (Signed)
PT Cancellation Note  Patient Details Name: Mckenzie [name] MRN: [id number] DOB: 12-07-1935   Cancelled Treatment:    Reason Eval/Treat Not Completed: Medical issues which prohibited therapy at this time. Per RN, pt with soft BP and requiring 1 unit PRBC, PT will continue to follow and evaluate when appropriate.   Deland Pretty, DPT   Acute Rehabilitation Department Pager #: [phone]   Gaetana Michaelis 09/28/2020, 11:56 AM

## 2020-09-28 NOTE — Evaluation (Signed)
Physical Therapy Evaluation Patient Details Name: Mckenzie [name] MRN: [id number] DOB: 29-Jul-1935 Today's Date: 09/28/2020   History of Present Illness  The pt is an 85 yo female presenting 4/15 after a fall at home with c/o L hip pain, inability to ambulate. Imaging revealed L hip fx and is now s/p IM nail of L femur on 4/15. PMH includes: CAD s/p LAD stent in 2003, HTN, COPD, GERD, and IBS.    Clinical Impression  Pt in bed upon arrival of PT, agreeable to evaluation at this time. Prior to admission the pt was completely independent without need for AD, driving, completed all ADLs and IADLs without assist, living in a home with 1 step to enter with spouse and disabled son. The pt now presents with limitations in functional mobility, strength, power, dynamic stability, activity tolerance, and pain due to above dx, that limit her ability to safely complete bed mobility or OOB transfers without modA for stability and to manage RW. The pt moves impulsively quickly at this time, with decreased safety awareness, problem solving, and ability to maintain TDWB without repeated cues. The pt was unable to use hop technique to mobilize this session, and required increased time, assist, and effort to complete wiggle/pivoting movements with RLE to complete pivot to recliner. The pt is highly motivated to return home, but will need continued therapy acutely and following d/c to facilitate safe return to independence with transfers and to reduce caregiver burden upon return home.      Follow Up Recommendations SNF (pending progression with PT)    Equipment Recommendations  3in1 (PT);Wheelchair (measurements PT);Wheelchair cushion (measurements PT)    Recommendations for Other Services OT consult     Precautions / Restrictions Precautions Precautions: Fall Precaution Comments: pt admitted for fall, denies other falls in last 6 months Restrictions Weight Bearing Restrictions: Yes LLE Weight Bearing:  Touchdown weight bearing      Mobility  Bed Mobility Overal bed mobility: Needs Assistance Bed Mobility: Supine to Sit     Supine to sit: Min assist;HOB elevated     General bed mobility comments: pt attempted movement, and immediately reached for PT to complete movement. Pt then given sequential cues and cuse to move slowly resulting in pt being able to complete movement of BLE and trunk with minA only.    Transfers Overall transfer level: Needs assistance Equipment used: Rolling walker (2 wheeled) Transfers: Sit to/from [employer] Sit to Stand: Min assist Stand pivot transfers: Mod assist;Max assist       General transfer comment: minA to power up with decent maintaining of TDWB RLE. repeated cues for hand placement with each rep, little carryover. mod/maxA to manage pivoting on RLE to recliner, pt needing assist to manage RW, constant cues to maintain TDWB, and mod/maxA for balance. pt unable to generate hop at this time  Ambulation/Gait             General Gait Details: pt unable to generate any clearance with RLE while maintaining TDWB, modA to complete wiggle/pivoting steps to pivot to recliner     Balance Overall balance assessment: Needs assistance Sitting-balance support: No upper extremity supported Sitting balance-Leahy Scale: Fair     Standing balance support: Bilateral upper extremity supported Standing balance-Leahy Scale: Poor Standing balance comment: reliant on BUE support and external support                             Pertinent Vitals/Pain  Pain Assessment: 0-10 Pain Score: 4  Pain Location: L hip and thigh Pain Descriptors / Indicators: Discomfort;Sore Pain Intervention(s): Limited activity within patient's tolerance;Monitored during session;Repositioned;Premedicated before session    Home Living Family/patient expects to be discharged to:: Private residence Living Arrangements: Spouse/significant other;Children  (disabled son) Available Help at Discharge: Family;Available 24 hours/day Type of Home: House Home Access: Stairs to enter Entrance Stairs-Rails: None Entrance Stairs-Number of Steps: 1 Home Layout: One level Home Equipment: Hand held shower head;Grab bars - tub/shower;Walker - 2 wheels;Cane - single point      Prior Function Level of Independence: Independent               Hand Dominance   Dominant Hand: Right    Extremity/Trunk Assessment   Upper Extremity Assessment Upper Extremity Assessment: Generalized weakness    Lower Extremity Assessment Lower Extremity Assessment: Generalized weakness;LLE deficits/detail LLE Deficits / Details: limited by TDWB, pt able to demo good movement against gravity at knee and ankle LLE Sensation: WNL    Cervical / Trunk Assessment Cervical / Trunk Assessment: Kyphotic  Communication   Communication: No difficulties  Cognition Arousal/Alertness: Awake/alert Behavior During Therapy: WFL for tasks assessed/performed;Impulsive (impulsive with mobility) Overall Cognitive Status: Impaired/Different from baseline Area of Impairment: Attention;Memory;Following commands;Problem solving                   Current Attention Level: Focused Memory: Decreased short-term memory;Decreased recall of precautions Following Commands: Follows one step commands consistently (with repeated cues)     Problem Solving: Difficulty sequencing;Requires verbal cues General Comments: repeated cues for precautions, to slow movements. Pt reports understanding of TDWB, but required increased cues to maintain with mobiltiy. Pt also with decreased insight to need for assistance, stating she was ready to return home despite having stairs at home and requiring mod-maxA to manage pivot at eval. cues for all problem solving and pt asking to return to bed immediately after PT exit despite verbalizing goal of sitting for an hour. Pt seemed unaware of discussion  about remaining in recliner.      General Comments General comments (skin integrity, edema, etc.): pt s/p 1 unit PRBC, RN arrived to deliver second unit after session. Pt spouse present and supportive. BP 114/48 (66) sitting in bed prior to session, 124/96 (100) sitting EOB    Exercises     Assessment/Plan    PT Assessment Patient needs continued PT services  PT Problem List Decreased strength;Decreased range of motion;Decreased activity tolerance;Decreased balance;Decreased mobility;Decreased coordination;Decreased cognition;Decreased safety awareness       PT Treatment Interventions Gait training;Stair training;DME instruction;Functional mobility training;Therapeutic activities;Therapeutic exercise;Balance training;Patient/family education    PT Goals (Current goals can be found in the Care Plan section)  Acute Rehab PT Goals Patient Stated Goal: return home asap, agreeable to rehab PT Goal Formulation: With patient/family Time For Goal Achievement: 10/12/20 Potential to Achieve Goals: Fair    Frequency Min 3X/week    AM-PAC PT "6 Clicks" Mobility  Outcome Measure Help needed turning from your back to your side while in a flat bed without using bedrails?: A Little Help needed moving from lying on your back to sitting on the side of a flat bed without using bedrails?: A Little Help needed moving to and from a bed to a chair (including a wheelchair)?: A Lot Help needed standing up from a chair using your arms (e.g., wheelchair or bedside chair)?: A Lot Help needed to walk in hospital room?: A Lot Help needed climbing 3-5 steps  with a railing? : Total 6 Click Score: 13    End of Session Equipment Utilized During Treatment: Gait belt Activity Tolerance: Patient tolerated treatment well Patient left: in chair;with nursing/sitter in room;with family/visitor present Nurse Communication: Mobility status PT Visit Diagnosis: Other abnormalities of gait and mobility  (R26.89);Pain;Muscle weakness (generalized) (M62.81) Pain - Right/Left: Left Pain - part of body: Hip    Time: 0630-1601 PT Time Calculation (min) (ACUTE ONLY): 34 min   Charges:   PT Evaluation $PT Eval Moderate Complexity: 1 Mod PT Treatments $Therapeutic Activity: 8-22 mins        Rolm Baptise, PT, DPT   Acute Rehabilitation Department Pager #: [phone]  Gaetana Michaelis 09/28/2020, 2:57 PM

## 2020-09-28 NOTE — Plan of Care (Signed)
Care plan has been added to document education

## 2020-09-28 NOTE — Progress Notes (Signed)
  Subjective: Patient stable.  Pain controlled.  Desires breakfast.   Objective: Vital signs in last 24 hours: Temp:  [98 F (36.7 C)-98.8 F (37.1 C)] 98.2 F (36.8 C) (04/16 0700) Pulse Rate:  [53-103] 75 (04/16 0700) Resp:  [11-21] 17 (04/16 0700) BP: (94-125)/(40-68) 98/49 (04/16 0700) SpO2:  [93 %-99 %] 98 % (04/16 0700) Weight:  [46.7 kg] 46.7 kg (04/15 0909)  Intake/Output from previous day: 04/15 0701 - 04/16 0700 In: 2200 [I.V.:1600; IV Piggyback:600] Out: 900 [Urine:800; Blood:100] Intake/Output this shift: No intake/output data recorded.  Exam:  Sensation intact distally Intact pulses distally Dorsiflexion/Plantar flexion intact  Labs: Recent Labs    09/27/20 0919 09/28/20 0139  HGB 10.4* 6.4*   Recent Labs    09/27/20 0919 09/28/20 0139  WBC 13.3* 8.8  RBC 3.36* 2.09*  HCT 31.7* 19.6*  PLT 175 126*   Recent Labs    09/27/20 0919 09/28/20 0139  NA 140 137  K 3.4* 3.3*  CL 105 107  CO2 26 24  BUN 15 14  CREATININE 0.67 0.73  GLUCOSE 115* 191*  CALCIUM 8.2* 7.5*   No results for input(s): LABPT, INR in the last 72 hours.  Assessment/Plan: Plan at this time is mobilization with touchdown weightbearing left lower extremity as tolerated.  Agree with transfusion for hemoglobin decreased from 10-6-1/2.  Anticipate discharge to skilled nursing versus home Monday or Tuesday   Burnard Bunting 09/28/2020, 8:31 AM

## 2020-09-29 LAB — TYPE AND SCREEN
ABO/RH(D): O NEG
Antibody Screen: NEGATIVE
Unit division: 0
Unit division: 0

## 2020-09-29 LAB — CBC
HCT: 24.9 % — ABNORMAL LOW (ref 36.0–46.0)
Hemoglobin: 8.5 g/dL — ABNORMAL LOW (ref 12.0–15.0)
MCH: 30.6 pg (ref 26.0–34.0)
MCHC: 34.1 g/dL (ref 30.0–36.0)
MCV: 89.6 fL (ref 80.0–100.0)
Platelets: 102 10*3/uL — ABNORMAL LOW (ref 150–400)
RBC: 2.78 MIL/uL — ABNORMAL LOW (ref 3.87–5.11)
RDW: 15.8 % — ABNORMAL HIGH (ref 11.5–15.5)
WBC: 9.4 10*3/uL (ref 4.0–10.5)
nRBC: 0 % (ref 0.0–0.2)

## 2020-09-29 LAB — HEPATIC FUNCTION PANEL
ALT: 18 U/L (ref 0–44)
AST: 35 U/L (ref 15–41)
Albumin: 2.8 g/dL — ABNORMAL LOW (ref 3.5–5.0)
Alkaline Phosphatase: 34 U/L — ABNORMAL LOW (ref 38–126)
Bilirubin, Direct: 0.1 mg/dL (ref 0.0–0.2)
Indirect Bilirubin: 0.7 mg/dL (ref 0.3–0.9)
Total Bilirubin: 0.8 mg/dL (ref 0.3–1.2)
Total Protein: 4.6 g/dL — ABNORMAL LOW (ref 6.5–8.1)

## 2020-09-29 LAB — BPAM RBC
Blood Product Expiration Date: 202205022359
Blood Product Expiration Date: 202205022359
ISSUE DATE / TIME: 202204161132
ISSUE DATE / TIME: 202204161458
Unit Type and Rh: 9500
Unit Type and Rh: 9500

## 2020-09-29 LAB — MAGNESIUM: Magnesium: 2 mg/dL (ref 1.7–2.4)

## 2020-09-29 LAB — BASIC METABOLIC PANEL
Anion gap: 4 — ABNORMAL LOW (ref 5–15)
BUN: 15 mg/dL (ref 8–23)
CO2: 26 mmol/L (ref 22–32)
Calcium: 7.8 mg/dL — ABNORMAL LOW (ref 8.9–10.3)
Chloride: 108 mmol/L (ref 98–111)
Creatinine, Ser: 0.66 mg/dL (ref 0.44–1.00)
GFR, Estimated: >60 mL/min (ref >60)
Glucose, Bld: 121 mg/dL — ABNORMAL HIGH (ref 70–99)
Potassium: 4 mmol/L (ref 3.5–5.1)
Sodium: 138 mmol/L (ref 135–145)

## 2020-09-29 LAB — GLUCOSE, CAPILLARY: Glucose-Capillary: 117 mg/dL — ABNORMAL HIGH (ref 70–99)

## 2020-09-29 MED ORDER — LOPERAMIDE HCL 2 MG PO CAPS
2.0000 mg | ORAL_CAPSULE | Freq: Four times a day (QID) | ORAL | Status: DC | PRN
  Administered 2020-09-29 – 2020-09-30 (×2): 2 mg via ORAL
  Filled 2020-09-29 (×2): qty 1

## 2020-09-29 MED ORDER — BISACODYL 10 MG RE SUPP: 10.0000 mg | Freq: Every day | RECTAL | Status: DC | PRN

## 2020-09-29 MED ORDER — POLYETHYLENE GLYCOL 3350 17 G PO PACK: 17.0000 g | PACK | Freq: Every day | ORAL | Status: DC | PRN

## 2020-09-29 NOTE — TOC Progression Note (Signed)
Transition of Care [facility]) - Progression Note    Patient Details  Name: Mckenzie [name] MRN: [id number] Date of Birth: 1935/06/28  Transition of Care [facility]) CM/SW Contact  Carmina Miller, LCSWA Phone Number: 09/29/2020, 12:13 PM  Clinical Narrative:    CSW spoke with pt's spouse via phone (spouse at bedside), interested in CIR. MD messaged CSW stating pt wanted SNF in [place]. CSW explained that pt may not be accepted into CIR and it's best to have a backup option. Provided information on Medicare.gov SNF ratings. Spouse and pt prefer [place] as that is where they live. Spouse states pt has received both vaccines and is boostered.  Pt and spouse are requesting CIR as their first choice, MD made aware and consult for CIR has been put in.         Expected Discharge Plan and Services                                                 Social Determinants of Health (SDOH) Interventions    Readmission Risk Interventions No flowsheet data found.

## 2020-09-29 NOTE — PMR Pre-admission (Signed)
PMR Admission Coordinator Pre-Admission Assessment  Patient: Mckenzie [name] is an 85 y.o., female MRN: [id number] DOB: October 08, 1935 Height: '5\' 2"'  (157.5 cm) Weight: 46.7 kg  Insurance Information HMO:     PPO:      PCP:      IPA:      80/20: yes     OTHER:  PRIMARY: Medicare Part A and B      Policy#: [id number]     Subscriber: Pt  Pre-Cert#: verified online      Employer:  Benefits:  Phone #:      Name:  Eff. Date: 08/14/02     Deduct: $1556      Out of Pocket Max: n/a      Life Max: n/a CIR: 100%      SNF: 20 full days and 80 copay days Outpatient: 80%     Co-Pay: 20% Home Health: 100%      Co-Pay:  DME: 80%     Co-Pay: 20% Providers: pt choice  SECONDARY: UHC Indemnity      Policy#: [id number]     Phone#:   Financial Counselor:       Phone#:   The "Data Collection Information Summary" for patients in Inpatient Rehabilitation Facilities with attached "Privacy Act [place] Records" was provided and verbally reviewed with: Patient  Emergency Contact Information Contact Information    Name Relation Home Work Mobile   [place] Spouse [phone]  [phone]      Current Medical History  Patient Admitting Diagnosis: L Hip fracture  History of Present Illness:The pt is an 85 yo female with PMH of CAD s/p LAD stent in 2003, HTN, COPD, GERD, and IBS presented 09/27/20 after a fall at home with c/o L hip pain, inability to ambulate. Imaging revealed L hip fx and is now s/p IM nail of L femur on 4/15. Course complicated by ABL (Pt. Received 2 units of PRBCs 09/28/20.), Hypotension, and hypokalemia   Patient's medical record [facility] has been reviewed by the rehabilitation admission coordinator and physician.  Past Medical History  Past Medical History:  Diagnosis Date  . Aortic valve sclerosis    Mild, echo, April, 2012  . ARDS (adult respiratory distress syndrome) ([place]) 2003   with tracheostomy-peritonitis  . Arthritis   . Back pain     Need for surgery, Dr. Glenna Fellows  . CAD (coronary artery disease)    Stent to LAD, 2003 [facility] / nuclear scan April, 201 to artifact / persistent shortness of breath / catheterization Oct 21, 2010... widely patent stent to the mid LAD, no significant obstructive disease, should be low risk    for back surgery  . Carotid arterial disease ([place])    Moderate-Dr. Trula Slade following  . Chronic back pain   . COPD (chronic obstructive pulmonary disease) ([place])   . Diastolic dysfunction    Echo, April, 2011   EF 65%  . Dysrhythmia    afib  . Ejection fraction    EF 65%, echo, September 15, 2010  . GERD (gastroesophageal reflux disease)   . Hemolytic anemia (H[facility])    Followed by Dr. Janae Sauce  . Hypertension   . Hypothyroidism   . Left ventricular hypertrophy    EF 65%, echo, April, 2011  . MR (mitral regurgitation)     Mild echo, 2012 ///  Mild per Echo 2009,  mild, echo, April, 2011  . Myocardial infarction ([place])   . Palpitations    Holter, September 16, 2010, PACs  and PVCs,one 4 beat run of SVT  . Pneumonia 11-2011  . Preoperative clearance    Preop clearance for back surgery  . Shortness of breath    March, 2012  . UTI (urinary tract infection)    Chronic-on Macrodantin    Family History   family history includes Diabetes in her son; Heart failure in her father.  Prior Rehab/Hospitalizations Has the patient had prior rehab or hospitalizations prior to admission? No  Has the patient had major surgery during 100 days prior to admission? Yes   Current Medications  Current Facility-Administered Medications:  .  0.9 %  sodium chloride infusion, 250 mL, Intravenous, PRN, Emokpae, Courage, MD .  acetaminophen (TYLENOL) tablet 325-650 mg, 325-650 mg, Oral, Q6H PRN, Magnant, Charles L, PA-C, 650 mg at 10/03/20 0328 .  albuterol (PROVENTIL) (2.5 MG/3ML) 0.083% nebulizer solution 2.5 mg, 2.5 mg, Nebulization, Q2H PRN, Emokpae, Courage, MD .  aspirin chewable tablet 81 mg, 81 mg, Oral, BID, Magnant,  Charles L, PA-C, 81 mg at 10/03/20 1042 .  atorvastatin (LIPITOR) tablet 10 mg, 10 mg, Oral, Daily, Emokpae, Courage, MD, 10 mg at 10/03/20 1042 .  benazepril (LOTENSIN) tablet 5 mg, 5 mg, Oral, Daily, Adhikari, Amrit, MD, 5 mg at 10/03/20 1043 .  bisacodyl (DULCOLAX) suppository 10 mg, 10 mg, Rectal, Daily PRN, [place], Yujing Z, RPH .  calcium-vitamin D (OSCAL WITH D) 500-200 MG-UNIT per tablet 2 tablet, 2 tablet, Oral, Daily, Emokpae, Courage, MD, 2 tablet at 10/03/20 1042 .  Chlorhexidine Gluconate Cloth 2 % PADS 6 each, 6 each, Topical, Daily, Florencia Reasons, MD, 6 each at 10/03/20 1043 .  cholecalciferol (VITAMIN D3) tablet 1,000 Units, 1,000 Units, Oral, Daily, Emokpae, Courage, MD, 1,000 Units at 10/03/20 1043 .  conjugated estrogens (PREMARIN) vaginal cream 1 Applicatorful, 1 Applicatorful, Vaginal, Daily, Emokpae, Courage, MD, 1 Applicatorful at 64/68/03 0914 .  heparin injection 5,000 Units, 5,000 Units, Subcutaneous, Q8H, Emokpae, Courage, MD, 5,000 Units at 10/03/20 0531 .  hydrALAZINE (APRESOLINE) injection 10 mg, 10 mg, Intravenous, Q6H PRN, Emokpae, Courage, MD .  levothyroxine (SYNTHROID) tablet 25 mcg, 25 mcg, Oral, Q0600, Shelly Coss, MD, 25 mcg at 10/03/20 0531 .  loperamide (IMODIUM) capsule 2 mg, 2 mg, Oral, Q6H PRN, Florencia Reasons, MD, 2 mg at 09/30/20 1353 .  menthol-cetylpyridinium (CEPACOL) lozenge 3 mg, 1 lozenge, Oral, PRN **OR** phenol (CHLORASEPTIC) mouth spray 1 spray, 1 spray, Mouth/Throat, PRN, Magnant, Charles L, PA-C .  methocarbamol (ROBAXIN) tablet 500 mg, 500 mg, Oral, Q6H PRN, 500 mg at 10/02/20 1746 **OR** methocarbamol (ROBAXIN) 500 mg in dextrose 5 % 50 mL IVPB, 500 mg, Intravenous, Q6H PRN, Magnant, Charles L, PA-C .  metoCLOPramide (REGLAN) tablet 5-10 mg, 5-10 mg, Oral, Q8H PRN **OR** metoCLOPramide (REGLAN) injection 5-10 mg, 5-10 mg, Intravenous, Q8H PRN, Magnant, Charles L, PA-C .  metoprolol tartrate (LOPRESSOR) tablet 25 mg, 25 mg, Oral, BID, Adhikari, Amrit,  MD, 25 mg at 10/03/20 1042 .  mometasone-formoterol (DULERA) 100-5 MCG/ACT inhaler 2 puff, 2 puff, Inhalation, BID, [place] Brick, Courage, MD, 2 puff at 10/03/20 0811 .  multivitamin with minerals tablet 1 tablet, 1 tablet, Oral, Daily, Emokpae, Courage, MD, 1 tablet at 10/03/20 1042 .  nitroGLYCERIN (NITROSTAT) SL tablet 0.4 mg, 0.4 mg, Sublingual, Q5 min PRN, Emokpae, Courage, MD .  ondansetron (ZOFRAN) tablet 4 mg, 4 mg, Oral, Q6H PRN **OR** ondansetron (ZOFRAN) injection 4 mg, 4 mg, Intravenous, Q6H PRN, Magnant, Charles L, PA-C .  oxyCODONE (Oxy IR/ROXICODONE) immediate release tablet 7.5 mg, 7.5 mg,  Oral, Q6H PRN, Shelly Coss, MD, 7.5 mg at 10/03/20 1045 .  pantoprazole (PROTONIX) EC tablet 40 mg, 40 mg, Oral, Daily, Emokpae, Courage, MD, 40 mg at 10/03/20 1042 .  polyethylene glycol (MIRALAX / GLYCOLAX) packet 17 g, 17 g, Oral, Daily PRN, Florencia Reasons, MD .  raloxifene (EVISTA) tablet 60 mg, 60 mg, Oral, Daily, Emokpae, Courage, MD, 60 mg at 10/03/20 1043 .  sodium chloride flush (NS) 0.9 % injection 3 mL, 3 mL, Intravenous, Q12H, Emokpae, Courage, MD, 3 mL at 10/02/20 2028 .  sodium chloride flush (NS) 0.9 % injection 3 mL, 3 mL, Intravenous, Q12H, Emokpae, Courage, MD, 3 mL at 10/03/20 1044 .  sodium chloride flush (NS) 0.9 % injection 3 mL, 3 mL, Intravenous, PRN, Emokpae, Courage, MD .  tamsulosin (FLOMAX) capsule 0.4 mg, 0.4 mg, Oral, Daily, Emokpae, Courage, MD, 0.4 mg at 10/03/20 1042 .  temazepam (RESTORIL) capsule 30 mg, 30 mg, Oral, QHS, Emokpae, Courage, MD, 30 mg at 10/02/20 2027  Patients Current Diet:  Diet Order            Diet - low sodium heart healthy           Diet regular Room service appropriate? Yes; Fluid consistency: Thin  Diet effective now                 Precautions / Restrictions Precautions Precautions: Fall Precaution Comments: pt admitted for fall, denies other falls in last 6 months Restrictions Weight Bearing Restrictions: Yes LLE Weight Bearing:  Touchdown weight bearing   Has the patient had 2 or more falls or a fall with injury in the past year? Yes  Prior Activity Level Community (5-7x/wk): Pt was active in the community PTA  Prior Functional Level Self Care: Did the patient need help bathing, dressing, using the toilet or eating? Independent  Indoor Mobility: Did the patient need assistance with walking from room to room (with or without device)? Independent  Stairs: Did the patient need assistance with internal or external stairs (with or without device)? Independent  Functional Cognition: Did the patient need help planning regular tasks such as shopping or remembering to take medications? [place] / [place] Devices/Equipment: Cane (specify quad or straight),Eyeglasses,Wheelchair,Walker (specify type) Home Equipment: Hand held shower head,Grab bars - tub/shower,Walker - 2 wheels,Cane - single point  Prior Device Use: Indicate devices/aids used by the patient prior to current illness, exacerbation or injury? None of the above  Current Functional Level Cognition  Overall Cognitive Status: Within Functional Limits for tasks assessed Current Attention Level: Focused Orientation Level: Oriented X4 Following Commands: Follows one step commands consistently General Comments: She was able to recall and explain TDWB but needed cues with ambulation    Extremity Assessment (includes Sensation/Coordination)  Upper Extremity Assessment: Overall WFL for tasks assessed,Generalized weakness  Lower Extremity Assessment: Defer to PT evaluation LLE Deficits / Details: limited by TDWB, pt able to demo good movement against gravity at knee and ankle LLE Sensation: WNL    ADLs  Overall ADL's : Needs assistance/impaired Eating/Feeding: Set up,Sitting Grooming: Wash/dry hands,Wash/dry face,Oral care,Applying deodorant,Brushing hair,Set up,Sitting Upper Body Bathing: Set up,Sitting Lower Body  Bathing: Minimal assistance,Sit to/from stand,Cueing for compensatory techniques Upper Body Dressing : Set up,Sitting Lower Body Dressing: Moderate assistance,Cueing for compensatory techniques,Sit to/from stand Toilet Transfer: Cueing for safety,Stand-pivot,RW,Minimal assistance Toileting- Water quality scientist and Hygiene: Sit to/from stand,Cueing for safety,Minimal assistance Functional mobility during ADLs:  (Upon ambulating wtih RW pt not adhering to TDWB,  pt educated and given visaul demonstration however still unable to adhere to TDWB. Pt comlpeted stand pivot with close min guard with rw to transfer char>bed wtih better adherence to TDWB.) General ADL Comments: Pt eager eage to get up and complete ADLs however is unsafe and requirs constant vc    Mobility  Overal bed mobility: Needs Assistance Bed Mobility: Sit to Supine Supine to sit: Min guard,HOB elevated Sit to supine: Min assist,HOB elevated General bed mobility comments: In chair at arrival    Transfers  Overall transfer level: Needs assistance Equipment used: Rolling walker (2 wheeled) Transfers: Sit to/from Stand Sit to Stand: Min assist Stand pivot transfers: Min assist General transfer comment: requiring cueing for weight bearing restrictions and foot placement    Ambulation / Gait / Stairs / Wheelchair Mobility  Ambulation/Gait Ambulation/Gait assistance: Herbalist (Feet): 30 Feet Assistive device: Rolling walker (2 wheeled) Gait Pattern/deviations: Step-to pattern,Decreased stride length,Decreased weight shift to left General Gait Details: Required frequent cues for TDWB status.  PT placed foot under pt's to assess for TDWB and pt needing cues to maintain and use UE.  Required 1 standing rest break. Seems limited due to upper body strength and coordinating walker use Gait velocity: decreased    Posture / Balance Balance Overall balance assessment: Needs assistance Sitting-balance support: No upper  extremity supported Sitting balance-Leahy Scale: Good Standing balance support: Bilateral upper extremity supported Standing balance-Leahy Scale: Poor Standing balance comment: Requiring RW or leaning on counter when washing hands    Special needs/care consideration Skin Post op left hip incision with dressing   Previous Home Environment (from acute therapy documentation) Living Arrangements: Spouse/significant other,Children  Lives With: Spouse Available Help at Discharge: Family,Available 24 hours/day Type of Home: House Home Layout: One level Home Access: Stairs to enter Entrance Stairs-Rails: None Entrance Stairs-Number of Steps: 1 Bathroom Shower/Tub: Multimedia programmer: Handicapped height Bathroom Accessibility: Yes How Accessible: Accessible via walker Home Care Services: No  Discharge Living Setting Plans for Discharge Living Setting: Patient's home Type of Home at Discharge: House Discharge Home Layout: One level Discharge Home Access: Stairs to enter Entrance Stairs-Rails: None Entrance Stairs-Number of Steps: 1 Discharge Bathroom Shower/Tub: Walk-in shower Discharge Bathroom Toilet: Handicapped height Discharge Bathroom Accessibility: Yes How Accessible: Accessible via walker Does the patient have any problems obtaining your medications?: No  Social/Family/Support Systems Patient Roles: Spouse Contact Information: [phone] Anticipated Caregiver: Cheyann Blecha Anticipated Caregiver's Contact Information: [phone] Ability/Limitations of Caregiver: Can provide min A Caregiver Availability: 24/7 Discharge Plan Discussed with Primary Caregiver: Yes Is Caregiver In Agreement with Plan?: Yes Does Caregiver/Family have Issues with Lodging/Transportation while Pt is in Rehab?: No  Goals Patient/Family Goal for Rehab: PT/OT Supervision to mod I goals Expected length of stay: 7-10 days Pt/Family Agrees to Admission and willing to participate:  Yes Program Orientation Provided & Reviewed with Pt/Caregiver Including Roles  & Responsibilities: Yes  Decrease burden of Care through IP rehab admission: Specialzed equipment needs, Decrease number of caregivers, Bowel and bladder program and Patient/family education  Possible need for SNF placement upon discharge: not anticipated  Patient Condition: I have reviewed medical records from [facility], spoken with CM, and patient and family member. I met with patient at the bedside and discussed via phone for inpatient rehabilitation assessment.  Patient will benefit from ongoing PT and OT, can actively participate in 3 hours of therapy a day 5 days of the week, and can make measurable gains during the admission.  Patient will also benefit from the coordinated team approach during an Inpatient Acute Rehabilitation admission.  The patient will receive intensive therapy as well as Rehabilitation physician, nursing, social worker, and care management interventions.  Due to safety, skin/wound care, disease management, medication administration, pain management and patient education the patient requires 24 hour a day rehabilitation nursing.  The patient is currently min assist with mobility and basic ADLs.  Discharge setting and therapy post discharge at home with home health is anticipated.  Patient has agreed to participate in the Acute Inpatient Rehabilitation Program and will admit today.  Preadmission Screen Completed By:  Retta Diones, 10/03/2020 11:19 AM ______________________________________________________________________   Discussed status with Dr. Naaman Plummer and Dr. Ranell Patrick on 10/03/20 at 0930 and received approval for admission today.  Admission Coordinator:  Retta Diones, RN, time 1119/Date 10/03/20   Assessment/Plan: Diagnosis: left hip fracture 1. Does the need for close, 24 hr/day Medical supervision in concert with the patient's rehab needs make it unreasonable for this  patient to be served in a less intensive setting? Yes 2. Co-Morbidities requiring supervision/potential complications:  1. GERD 2. Mild aortic valve sclerosis 3. CAD 4. Hyopothyroidism 5. Left ventricular hypertrophy 3. Due to bladder management, bowel management, safety, skin/wound care, disease management, medication administration, pain management and patient education, does the patient require 24 hr/day rehab nursing? Yes 4. Does the patient require coordinated care of a physician, rehab nurse, PT, OT to address physical and functional deficits in the context of the above medical diagnosis(es)? Yes Addressing deficits in the following areas: balance, endurance, locomotion, strength, transferring, bowel/bladder control, bathing, dressing, feeding, grooming, toileting and psychosocial support 5. Can the patient actively participate in an intensive therapy program of at least 3 hrs of therapy 5 days a week? Yes 6. The potential for patient to make measurable gains while on inpatient rehab is excellent 7. Anticipated functional outcomes upon discharge from inpatient rehab: modified independent PT, modified independent OT, independent SLP 8. Estimated rehab length of stay to reach the above functional goals is: 10-12 days 9. Anticipated discharge destination: Home 10. Overall Rehab/Functional Prognosis: excellent   MD Signature: Leeroy Cha, MD

## 2020-09-29 NOTE — Plan of Care (Signed)

## 2020-09-29 NOTE — Progress Notes (Addendum)
PROGRESS NOTE    Mckenzie [name]  WUJ:[id number] DOB: [birth year] DOA: 09/27/2020 PCP: Royann Shivers, PA-C    Chief Complaint  Patient presents with  . Fall    Brief Narrative:  Mckenzie [name]  is a 85 y.o. female 85 year old with history of CAD status post prior stenting 2003, COPD, GERD, postsurgical hypothyroidism, history of mild aortic stenosis, mild MR, as well as IBS presents to the ED with left hip pain after falling around midnight yesterday -Both patient and husband states that this was a mechanical fall -Denies syncope, denies chest pains palpitations dizziness or shortness of breath before or after fall -Denies seizures -In the ED hip x-rays demonstrate displaced angulated and overriding intertrochanteric femur fracture on the left-- She is transferred from [facility] to [facility] for surgery  Subjective:   POD#2, she is sitting up in chair, reports feeling better Husband at bedside , he is requesting CIR placement  Assessment & Plan:   Principal Problem:   Lt Hip Hip fracture -s/p Mechanical Fall Active Problems:   GERD   Mild Aortic valve sclerosis   CAD (coronary artery disease)/Stent to LAD, 2003 [facility]   Hypothyroidism, postsurgical   Irritable bowel syndrome (IBS)   COPD (chronic obstructive pulmonary disease) (HCC)   HTN (hypertension)  Left hip intertrochanteric/subtrochanteric femur fracture from mechanical fall at home S/p Left hip IMN On 4/15 touchdown weightbearing left lower extremity as tolerated Postop wound care, pain management and DVT prophylaxis per Ortho, appreciate Ortho input  Postop blood loss anemia Hemoglobin 10.4 preop, hemoglobin 6.4 on POD#1 S/p PRBC transfusion x2 on 4/16 hgb 8.5 today  Leukocytosis WBC 13.3 on presentation, likely reactive WBC normalized Monitor  HTN bp low normal, decrease lopressor , add on holding parameters Continue hold benazepril  Hypokalemia  replaced and improved, mag 2     CAD  with prior stent to LAD in 2003-no chest pains, no ACS type symptoms  -EKG without acute concerns -continue Lipitor, aspirin and metoprolol  Heart Murmur--history of mild AS and mild MR, echo from November 2019 noted with preserved EF of 60 to 65%--- repeat echo on 4/15 , no significant interval changes except "A small pericardial effusion is present. There is no evidence of cardiac tamponade. "  She denies chest pain  COPD-chest x-ray with emphysema, no acute exacerbation--continue bronchodilators   chronic bladder outlet obstruction  Indwelling foley to be changed qmonths, due to change on Monday,  follows [facility]  IBS with predominant constipation----continue MiraLAX, hold Movantik, hold Levsin  GERD-stable, continue PPI   postsurgical hypothyroidism--- continue levothyroxine, check TSH    Body mass index is 18.84 kg/m.Marland Kitchen       Unresulted Labs (From admission, onward)          Start     Ordered   09/30/20 0500  TSH  Tomorrow morning,   R        09/29/20 1159   09/30/20 0500  Basic metabolic panel  Tomorrow morning,   R        09/29/20 1159   09/29/20 1153  SARS CORONAVIRUS 2 (TAT 6-24 HRS) Nasopharyngeal Nasopharyngeal Swab  Once,   R       Question Answer Comment  Is this test for diagnosis or screening Screening   Symptomatic for COVID-19 as defined by CDC No   Hospitalized for COVID-19 No   Admitted to ICU for COVID-19 No   Previously tested for COVID-19 Yes   Resident in a congregate (group) care  setting No   Employed in healthcare setting No   Pregnant No   Has patient completed COVID vaccination(s) (2 doses of Pfizer/Moderna 1 dose of Starr & Thal) Unknown      09/29/20 1152   09/28/20 1605  Urinalysis, Routine w reflex microscopic Urine, Random  Once,   R        09/28/20 1604   09/28/20 0500  CBC  Daily,   R      09/27/20 2043            DVT prophylaxis: heparin injection 5,000 Units Start: 09/28/20 1800 Place TED hose Start:  09/27/20 2044 SCDs Start: 09/27/20 1938 SCDs Start: 09/27/20 1112 Place TED hose Start: 09/27/20 1112   Code Status: Full Family Communication: Husband at bedside daily Disposition:   Status is: Inpatient  Dispo: The patient is from: Home              Anticipated d/c is to: skilled nursing facility vs CIR              Anticipated d/c date is: medically stable to discharge,  covid screening ordered on 4/17  , need to change indwelling foley on monday                Consultants:   Orthopedics  Procedures:   IMN left hip on 4/15 by Dr August Saucer  Antimicrobials:    Anti-infectives (From admission, onward)   Start     Dose/Rate Route Frequency Ordered Stop   09/28/20 0600  ceFAZolin (ANCEF) IVPB 2g/100 mL premix  Status:  Discontinued        2 g 200 mL/hr over 30 Minutes Intravenous On call to O.R. 09/27/20 1522 09/27/20 2038   09/27/20 2200  ceFAZolin (ANCEF) IVPB 2g/100 mL premix        2 g 200 mL/hr over 30 Minutes Intravenous Every 8 hours 09/27/20 2043 09/28/20 0751         Objective: Vitals:   09/28/20 2336 09/29/20 0400 09/29/20 0743 09/29/20 0804  BP: (!) 96/47 120/70 140/70   Pulse: 68 71 75   Resp: Temp: 98.4 F (36.9 C) 97.8 F (36.6 C) 97.8 F (36.6 C)   TempSrc: Oral Oral Oral   SpO2: 96% 100% 98% 98%  Weight:      Height:        Intake/Output Summary (Last 24 hours) at 09/29/2020 1200 Last data filed at 09/29/2020 0900 Gross per 24 hour  Intake 1115.83 ml  Output 500 ml  Net 615.83 ml   Filed Weights   09/27/20 0909  Weight: 46.7 kg    Examination:  General exam: appear stronger, sitting up in chair, hard of hearing, calm, NAD Respiratory system: Clear to auscultation. Respiratory effort normal. Cardiovascular system: S1 & S2 heard, RRR. Soft  Murmur 1-2/6, No pedal edema. Gastrointestinal system: Abdomen is nondistended, soft and nontender.  Normal bowel sounds heard. Central nervous system: Alert and oriented. No focal  neurological deficits. Extremities: left hip post op changes  Skin: No rashes, lesions or ulcers Psychiatry: Judgement and insight appear normal. Mood & affect appropriate.     Data Reviewed: I have personally reviewed following labs and imaging studies  CBC: Recent Labs  Lab 09/27/20 0919 09/28/20 0139 09/29/20 0206  WBC 13.3* 8.8 9.4  NEUTROABS 10.6*  --   --   HGB 10.4* 6.4* 8.5*  HCT 31.7* 19.6* 24.9*  MCV 94.3 93.8 89.6  PLT 175 126* 102*  Basic Metabolic Panel: Recent Labs  Lab 09/27/20 0919 09/28/20 0139 09/29/20 0206  NA 140 137 138  K 3.4* 3.3* 4.0  CL 105 107 108  CO2 26 24 26   GLUCOSE 115* 191* 121*  BUN 15 14 15   CREATININE 0.67 0.73 0.66  CALCIUM 8.2* 7.5* 7.8*  MG  --   --  2.0    GFR: Estimated Creatinine Clearance: 39.3 mL/min (by C-G formula based on SCr of 0.66 mg/dL).  Liver Function Tests: Recent Labs  Lab 09/27/20 0919 09/29/20 0206  AST 27 35  ALT 19 18  ALKPHOS 45 34*  BILITOT 0.8 0.8  PROT 5.1* 4.6*  ALBUMIN 3.2* 2.8*    CBG: Recent Labs  Lab 09/28/20 2153 09/28/20 2333 09/29/20 0437  GLUCAP 151* 134* 117*     Recent Results (from the past 240 hour(s))  Resp Panel by RT-PCR (Flu A&B, Covid) Nasopharyngeal Swab     Status: None   Collection Time: 09/27/20 11:26 AM   Specimen: Nasopharyngeal Swab; Nasopharyngeal(NP) swabs in vial transport medium  Result Value Ref Range Status   SARS Coronavirus 2 by RT PCR NEGATIVE NEGATIVE Final    Comment: (NOTE) SARS-CoV-2 target nucleic acids are NOT DETECTED.  The SARS-CoV-2 RNA is generally detectable in upper respiratory specimens during the acute phase of infection. The lowest concentration of SARS-CoV-2 viral copies this assay can detect is 138 copies/mL. A negative result does not preclude SARS-Cov-2 infection and should not be used as the sole basis for treatment or other patient management decisions. A negative result may occur with  improper specimen  collection/handling, submission of specimen other than nasopharyngeal swab, presence of viral mutation(s) within the areas targeted by this assay, and inadequate number of viral copies(<138 copies/mL). A negative result must be combined with clinical observations, patient history, and epidemiological information. The expected result is Negative.  Fact Sheet for Patients:  10/01/20  Fact Sheet for Healthcare Providers:  09/29/20  This test is no t yet approved or cleared by the [employer] FDA and  has been authorized for detection and/or diagnosis of SARS-CoV-2 by FDA under an Emergency Use Authorization (EUA). This EUA will remain  in effect (meaning this test can be used) for the duration of the COVID-19 declaration under Section 564(b)(1) of the Act, 21 U.S.C.section 360bbb-3(b)(1), unless the authorization is terminated  or revoked sooner.       Influenza A by PCR NEGATIVE NEGATIVE Final   Influenza B by PCR NEGATIVE NEGATIVE Final    Comment: (NOTE) The Xpert Xpress SARS-CoV-2/FLU/RSV plus assay is intended as an aid in the diagnosis of influenza from Nasopharyngeal swab specimens and should not be used as a sole basis for treatment. Nasal washings and aspirates are unacceptable for Xpert Xpress SARS-CoV-2/FLU/RSV testing.  Fact Sheet for Patients: SeriousBroker.it  Fact Sheet for Healthcare Providers: [place]  This test is not yet approved or cleared by the [employer] FDA and has been authorized for detection and/or diagnosis of SARS-CoV-2 by FDA under an Emergency Use Authorization (EUA). This EUA will remain in effect (meaning this test can be used) for the duration of the COVID-19 declaration under Section 564(b)(1) of the Act, 21 U.S.C. section 360bbb-3(b)(1), unless the authorization is terminated or revoked.  Performed at [facility], [address], [place], [address] [place]          Radiology Studies: Pelvis Portable  Result Date: 09/27/2020 CLINICAL DATA:  Post left hip fracture EXAM: PORTABLE PELVIS 1-2 VIEWS COMPARISON:  01/27/2021 FINDINGS: Internal fixation within the left femur with intramedullary nail and dynamic hip screw. Anatomic alignment. No hardware complicating feature. Mild degenerative changes in the hips bilaterally. IMPRESSION: Internal fixation across the left proximal femoral fracture. No complicating feature. Electronically Signed   By: Charlett Nose M.D.   On: 09/27/2020 19:48   DG C-Arm 1-60 Min  Result Date: 09/27/2020 CLINICAL DATA:  Intramedullary nail left femur. EXAM: DG C-ARM 1-60 MIN; LEFT FEMUR 2 VIEWS FLUOROSCOPY TIME:  Fluoroscopy Time:  1 minutes and 50 seconds. Radiation Exposure Index (if provided by the fluoroscopic device): 7.61 mGy. Number of Acquired Spot Images: 6 COMPARISON:  September 27, 2020. FINDINGS: Six C-arm fluoroscopic images were obtained intraoperatively and submitted for post operative interpretation. These images demonstrate intramedullary nail and screw fixation of a left femoral neck fracture with improved, near anatomic alignment. Please see the performing provider's procedural report for further detail. IMPRESSION: Intraoperative fluoroscopy, as detailed above. Electronically Signed   By: Feliberto Harts MD   On: 09/27/2020 18:42   ECHOCARDIOGRAM COMPLETE  Result Date: 09/27/2020    ECHOCARDIOGRAM REPORT   Patient Name:   MEERA VASCO Date of Exam: 09/27/2020 Medical Rec #:  [id number]         Height:       62.0 in Accession #:    [id number]        Weight:       103.0 lb Date of Birth:  [birth year]         BSA:          1.442 m Patient Age:    85 years          BP:           123/40 mmHg Patient Gender: F                 HR:           103 bpm. [facility] Procedure: 2D Echo, Cardiac Doppler and Color Doppler Indications:    Abnormal ECG R94.31   History:        Patient has no prior history of Echocardiogram examinations.                 CAD; Risk Factors:Hypertension and Dyslipidemia.  Sonographer:    Jeryl Columbia RDCS (AE) Referring Phys: [id number] COURAGE EMOKPAE IMPRESSIONS  1. Left ventricular ejection fraction, by estimation, is 65 to 70%. The left ventricle has normal function. The left ventricle has no regional wall motion abnormalities. There is moderate concentric left ventricular hypertrophy. Left ventricular diastolic parameters are indeterminate.  2. Right ventricular systolic function is normal. The right ventricular size is normal.  3. A small pericardial effusion is present. There is no evidence of cardiac tamponade.  4. The mitral valve is grossly normal. Trivial mitral valve regurgitation. No evidence of mitral stenosis. Moderate to severe mitral annular calcification.  5. The aortic valve is grossly normal. Aortic valve regurgitation is not visualized. Mild aortic valve stenosis.  6. The inferior vena cava is normal in size with greater than 50% respiratory variability, suggesting right atrial pressure of 3 mmHg. Comparison(s): Prior images unable to be directly viewed, comparison made by report only. Conclusion(s)/Recommendation(s): Otherwise normal echocardiogram, with minor abnormalities described in the report. FINDINGS  Left Ventricle: Left ventricular ejection fraction, by estimation, is 65 to 70%. The left ventricle has normal function. The left ventricle has no regional wall motion abnormalities. The left ventricular internal cavity size was normal  in size. There is  moderate concentric left ventricular hypertrophy. Left ventricular diastolic parameters are indeterminate. Right Ventricle: The right ventricular size is normal. Right vetricular wall thickness was not well visualized. Right ventricular systolic function is normal. Left Atrium: Left atrial size was normal in size. Right Atrium: Right atrial size was normal in size.  Pericardium: A small pericardial effusion is present. There is no evidence of cardiac tamponade. Presence of pericardial fat pad. Mitral Valve: The mitral valve is grossly normal. Moderate to severe mitral annular calcification. Trivial mitral valve regurgitation. No evidence of mitral valve stenosis. Tricuspid Valve: The tricuspid valve is normal in structure. Tricuspid valve regurgitation is trivial. No evidence of tricuspid stenosis. Aortic Valve: The aortic valve is grossly normal. Aortic valve regurgitation is not visualized. Mild aortic stenosis is present. Aortic valve mean gradient measures 14.3 mmHg. Aortic valve peak gradient measures 31.9 mmHg. Aortic valve area, by VTI measures 2.40 cm. Pulmonic Valve: The pulmonic valve was not well visualized. Pulmonic valve regurgitation is not visualized. Aorta: The aortic arch was not well visualized, the ascending aorta was not well visualized and the aortic root was not well visualized. Venous: The inferior vena cava is normal in size with greater than 50% respiratory variability, suggesting right atrial pressure of 3 mmHg. IAS/Shunts: The interatrial septum was not well visualized.  LEFT VENTRICLE PLAX 2D LVOT diam:     1.90 cm  Diastology LV SV:         97       LV e' medial:    5.44 cm/s LV SV Index:   67       LV E/e' medial:  14.3 LVOT Area:     2.84 cm LV e' lateral:   3.81 cm/s                         LV E/e' lateral: 20.4  RIGHT VENTRICLE RV S prime:     12.90 cm/s TAPSE (M-mode): 1.3 cm LEFT ATRIUM             Index       RIGHT ATRIUM          Index LA Vol (A2C):   25.7 ml 17.82 ml/m RA Area:     5.68 cm LA Vol (A4C):   19.4 ml 13.45 ml/m RA Volume:   8.92 ml  6.19 ml/m LA Biplane Vol: 22.7 ml 15.74 ml/m  AORTIC VALVE AV Area (Vmax):    1.82 cm AV Area (Vmean):   2.18 cm AV Area (VTI):     2.40 cm AV Vmax:           282.33 cm/s AV Vmean:          164.000 cm/s AV VTI:            0.402 m AV Peak Grad:      31.9 mmHg AV Mean Grad:      14.3 mmHg  LVOT Vmax:         181.67 cm/s LVOT Vmean:        126.000 cm/s LVOT VTI:          0.341 m LVOT/AV VTI ratio: 0.85 MITRAL VALVE MV Area (PHT): 2.91 cm     SHUNTS MV Decel Time: 261 msec     Systemic VTI:  0.34 m MV E velocity: 77.60 cm/s   Systemic Diam: 1.90 cm MV A velocity: 129.00 cm/s MV E/A ratio:  0.60 Jodelle Red MD Electronically  signed by Jodelle RedBridgette Christopher MD Signature Date/Time: 09/27/2020/4:18:47 PM    Final    DG FEMUR MIN 2 VIEWS LEFT  Result Date: 09/27/2020 CLINICAL DATA:  Intramedullary nail left femur. EXAM: DG C-ARM 1-60 MIN; LEFT FEMUR 2 VIEWS FLUOROSCOPY TIME:  Fluoroscopy Time:  1 minutes and 50 seconds. Radiation Exposure Index (if provided by the fluoroscopic device): 7.61 mGy. Number of Acquired Spot Images: 6 COMPARISON:  September 27, 2020. FINDINGS: Six C-arm fluoroscopic images were obtained intraoperatively and submitted for post operative interpretation. These images demonstrate intramedullary nail and screw fixation of a left femoral neck fracture with improved, near anatomic alignment. Please see the performing provider's procedural report for further detail. IMPRESSION: Intraoperative fluoroscopy, as detailed above. Electronically Signed   By: Feliberto HartsFrederick S Jones MD   On: 09/27/2020 18:42        Scheduled Meds: . aspirin  81 mg Oral BID  . atorvastatin  10 mg Oral Daily  . calcium-vitamin D  2 tablet Oral Daily  . Chlorhexidine Gluconate Cloth  6 each Topical Daily  . cholecalciferol  1,000 Units Oral Daily  . conjugated estrogens  1 Applicatorful Vaginal Daily  . heparin  5,000 Units Subcutaneous Q8H  . levothyroxine  75 mcg Oral QAC breakfast  . metoprolol tartrate  12.5 mg Oral BID  . mometasone-formoterol  2 puff Inhalation BID  . multivitamin with minerals  1 tablet Oral Daily  . pantoprazole  40 mg Oral Daily  . raloxifene  60 mg Oral Daily  . sodium chloride flush  3 mL Intravenous Q12H  . sodium chloride flush  3 mL Intravenous Q12H  .  tamsulosin  0.4 mg Oral Daily  . temazepam  30 mg Oral QHS   Continuous Infusions: . sodium chloride    . methocarbamol (ROBAXIN) IV       LOS: 2 days   Time spent: 25mins Greater than 50% of this time was spent in counseling, explanation of diagnosis, planning of further management, and coordination of care.   Voice Recognition Reubin [place]/Dragon dictation system was used to create this note, attempts have been made to correct errors. Please contact the author with questions and/or clarifications.   Albertine GratesFang Geraline Halberstadt, MD PhD FACP [facility]  Available via Epic secure chat 7am-7pm for nonurgent issues Please page for urgent issues To page the attending provider between 7A-7P or the covering provider during after hours 7P-7A, please log into the web site [email or web address] and access using universal [place] password for that web site. If you do not have the password, please call the hospital operator.    09/29/2020, 12:00 PM

## 2020-09-29 NOTE — Progress Notes (Signed)
Patient stable. POD2 s/p left hip intramedullary nail for intertroch/subtroch fracture.  Doing well, pain is controlled.  Had PT yesterday who recommended SNF. Dressings are dry and intact.  DF/PF of operative extremity intact. ASA 81mg  BID for DVT prophylaxis.  No calf tenderness.  Plan for disposition to SNF pending medical team clearance; appreciate hospitalist management.

## 2020-09-29 NOTE — Progress Notes (Signed)
   09/29/20 1139  OTHER  Substance Abuse Education Offered No (Patient declined any concerns or history of substance use.)  (CAGE-AID) Substance Abuse Screening Tool  Have You Ever Felt You Ought to Cut Down on Your Drinking or Drug Use? 0  Have People Annoyed You By Critizing Your Drinking Or Drug Use? 0  Have You Felt Bad Or Guilty About Your Drinking Or Drug Use? 0  Have You Ever Had a Drink or Used Drugs First Thing In The Morning to Steady Your Nerves or to Get Rid of a Hangover? 0  CAGE-AID Score 0

## 2020-09-29 NOTE — Progress Notes (Signed)
Inpatient Rehab Admissions Coordinator:   Spoke with Pt. And husband to discuss potential CIR admission. Pt. Stated interest. Pt.'s husband states he can provide 24/7 support at d/c.  Will pursue for potential admit next week, pending bed availability and medical readiness.   Megan Salon, MS, CCC-SLP Rehab Admissions Coordinator  [phone] (celll) [phone] (office)

## 2020-09-30 ENCOUNTER — Encounter (HOSPITAL_COMMUNITY): Payer: Self-pay | Admitting: Orthopedic Surgery

## 2020-09-30 LAB — CBC
HCT: 23.1 % — ABNORMAL LOW (ref 36.0–46.0)
Hemoglobin: 7.9 g/dL — ABNORMAL LOW (ref 12.0–15.0)
MCH: 30.7 pg (ref 26.0–34.0)
MCHC: 34.2 g/dL (ref 30.0–36.0)
MCV: 89.9 fL (ref 80.0–100.0)
Platelets: 96 10*3/uL — ABNORMAL LOW (ref 150–400)
RBC: 2.57 MIL/uL — ABNORMAL LOW (ref 3.87–5.11)
RDW: 15.9 % — ABNORMAL HIGH (ref 11.5–15.5)
WBC: 6.6 10*3/uL (ref 4.0–10.5)
nRBC: 0 % (ref 0.0–0.2)

## 2020-09-30 LAB — COMPREHENSIVE METABOLIC PANEL
ALT: 20 U/L (ref 0–44)
AST: 35 U/L (ref 15–41)
Albumin: 2.7 g/dL — ABNORMAL LOW (ref 3.5–5.0)
Alkaline Phosphatase: 41 U/L (ref 38–126)
Anion gap: 3 — ABNORMAL LOW (ref 5–15)
BUN: 10 mg/dL (ref 8–23)
CO2: 27 mmol/L (ref 22–32)
Calcium: 8 mg/dL — ABNORMAL LOW (ref 8.9–10.3)
Chloride: 108 mmol/L (ref 98–111)
Creatinine, Ser: 0.54 mg/dL (ref 0.44–1.00)
GFR, Estimated: >60 mL/min (ref >60)
Glucose, Bld: 91 mg/dL (ref 70–99)
Potassium: 4 mmol/L (ref 3.5–5.1)
Sodium: 138 mmol/L (ref 135–145)
Total Bilirubin: 1 mg/dL (ref 0.3–1.2)
Total Protein: 4.8 g/dL — ABNORMAL LOW (ref 6.5–8.1)

## 2020-09-30 LAB — T4, FREE: Free T4: 2.27 ng/dL — ABNORMAL HIGH (ref 0.61–1.12)

## 2020-09-30 LAB — BASIC METABOLIC PANEL
Anion gap: 8 (ref 5–15)
BUN: 11 mg/dL (ref 8–23)
CO2: 22 mmol/L (ref 22–32)
Calcium: <4 mg/dL — CL (ref 8.9–10.3)
Chloride: 108 mmol/L (ref 98–111)
Creatinine, Ser: 0.58 mg/dL (ref 0.44–1.00)
GFR, Estimated: >60 mL/min (ref >60)
Glucose, Bld: 91 mg/dL (ref 70–99)
Potassium: >7.5 mmol/L (ref 3.5–5.1)
Sodium: 138 mmol/L (ref 135–145)

## 2020-09-30 LAB — TSH
TSH: 0.111 u[IU]/mL — ABNORMAL LOW (ref 0.350–4.500)
TSH: 0.111 u[IU]/mL — ABNORMAL LOW (ref 0.350–4.500)

## 2020-09-30 NOTE — Progress Notes (Signed)
Physical Therapy Treatment Patient Details Name: Mckenzie [name] MRN: [id number] DOB: Apr 18, 1936 Today's Date: 09/30/2020    History of Present Illness The pt is an 85 yo female presenting 4/15 after a fall at home with c/o L hip pain, inability to ambulate. Imaging revealed L hip fx and is now s/p IM nail of L femur on 4/15. PMH includes: CAD s/p LAD stent in 2003, HTN, COPD, GERD, and IBS.    PT Comments    Pt making excellent progress and able to ambulate short distances in room with rest breaks and assist.  She does need increased assistance and cues with fatigue to help maintain TDWB.  Improved safety awareness and decreased impulsiveness today.  Did note pt accepted to CIR - updated POC accordingly.     Follow Up Recommendations  CIR     Equipment Recommendations  3in1 (PT);Wheelchair (measurements PT);Wheelchair cushion (measurements PT) (further assessment next venue)    Recommendations for Other Services       Precautions / Restrictions Precautions Precautions: Fall Precaution Comments: pt admitted for fall, denies other falls in last 6 months Restrictions LLE Weight Bearing: Touchdown weight bearing    Mobility  Bed Mobility Overal bed mobility: Needs Assistance Bed Mobility: Supine to Sit     Supine to sit: Min guard;HOB elevated     General bed mobility comments: HOB elevated and used bil UE to assist L LE    Transfers Overall transfer level: Needs assistance Equipment used: Rolling walker (2 wheeled) Transfers: Sit to/from Stand Sit to Stand: Min assist         General transfer comment: Min A from bed and toilet to stand with cues for safe hand placmeent and R LE placement to maintain TDWB  Ambulation/Gait Ambulation/Gait assistance: Min assist Gait Distance (Feet): 12 Feet (12'x2) Assistive device: Rolling walker (2 wheeled) Gait Pattern/deviations: Step-to pattern;Decreased stride length;Decreased weight shift to left Gait velocity:  decreased   General Gait Details: Pt ambulated to and from bathroom.  Educated on TDWB with demonstration prior to ambulation.  Pt started off well with TDWB but as she speeds up or fatigues needed increased cues and min A to maintain   Stairs             Wheelchair Mobility    Modified Rankin (Stroke Patients Only)       Balance Overall balance assessment: Needs assistance Sitting-balance support: No upper extremity supported Sitting balance-Leahy Scale: Good     Standing balance support: Bilateral upper extremity supported Standing balance-Leahy Scale: Poor Standing balance comment: Requiring RW or leaning on counter when washing hands                            Cognition Arousal/Alertness: Awake/alert Behavior During Therapy: WFL for tasks assessed/performed Overall Cognitive Status: Within Functional Limits for tasks assessed                                 General Comments: Pt more alert and following commands today.  Demonstrated good safety - did need cues for TDWB as she fatigued      Exercises General Exercises - Lower Extremity Ankle Circles/Pumps: AROM;Both;10 reps;Seated Quad Sets: AROM;Both;10 reps;Supine Long Arc Quad: AROM;Both;10 reps;Seated Hip ABduction/ADduction: AAROM;Left;10 reps;Supine Hip Flexion/Marching: AROM;Right;AAROM;Left;10 reps;Seated Other Exercises Other Exercises: Cues for controlled movement and decreased compensation with exercises    General Comments General comments (skin  integrity, edema, etc.): Spouse present      Pertinent Vitals/Pain Pain Assessment: 0-10 Pain Score: 2  Pain Location: L hip and thigh Pain Descriptors / Indicators: Discomfort;Sore Pain Intervention(s): Limited activity within patient's tolerance;Monitored during session;Premedicated before session;Repositioned;Ice applied    Home Living                      Prior Function            PT Goals (current goals  can now be found in the care plan section) Acute Rehab PT Goals Patient Stated Goal: return home asap, agreeable to rehab Progress towards PT goals: Progressing toward goals    Frequency    Min 5X/week      PT Plan Discharge plan needs to be updated;Frequency needs to be updated (noted pt accepted to CIR)    Co-evaluation              AM-PAC PT "6 Clicks" Mobility   Outcome Measure  Help needed turning from your back to your side while in a flat bed without using bedrails?: A Little Help needed moving from lying on your back to sitting on the side of a flat bed without using bedrails?: A Little Help needed moving to and from a bed to a chair (including a wheelchair)?: A Little Help needed standing up from a chair using your arms (e.g., wheelchair or bedside chair)?: A Little Help needed to walk in hospital room?: A Little Help needed climbing 3-5 steps with a railing? : A Lot 6 Click Score: 17    End of Session Equipment Utilized During Treatment: Gait belt Activity Tolerance: Patient tolerated treatment well Patient left: in chair;with family/visitor present;with call bell/phone within reach;with nursing/sitter in room (bed alarm pad under pt but no box available - pt following commands with family present - RN notified and reports pt following commands and has not tried to get up; pt verbalizing she will not get up on her own) Nurse Communication: Mobility status PT Visit Diagnosis: Other abnormalities of gait and mobility (R26.89);Pain;Muscle weakness (generalized) (M62.81) Pain - Right/Left: Left Pain - part of body: Hip     Time: 1062-6948 PT Time Calculation (min) (ACUTE ONLY): 27 min  Charges:  $Gait Training: 8-22 mins $Therapeutic Exercise: 8-22 mins                     Anise Salvo, PT Acute Rehab Services Pager [phone] [facility] [phone]     Rayetta Humphrey 09/30/2020, 11:52 AM

## 2020-09-30 NOTE — Plan of Care (Signed)

## 2020-09-30 NOTE — Progress Notes (Addendum)
Occupational Therapy Evaluation Patient Details Name: Mckenzie [name] MRN: [id number] DOB: 1936/06/04 Today's Date: 09/30/2020    History of Present Illness The pt is an 85 yo female presenting 4/15 after a fall at home with c/o L hip pain, inability to ambulate. Imaging revealed L hip fx and is now s/p IM nail of L femur on 4/15. PMH includes: CAD s/p LAD stent in 2003, HTN, COPD, GERD, and IBS.   Clinical Impression   "Mckenzie [name]" was pleasant and eager for this eval, reported 5/10 pain at the start of the session, RN notified. Pt was indep prior to this admission with all ADLs/IADLs including driving. Pt is doing excellent with moving around however is not adhering to TWB status when ambulating with rw given max verbal cues, visual demonstration and education. For safety, pt completed stand pivot from chair>bed with rw and min A. Mckenzie [name] is min/mod A for all ADLs for some physical assistance with balance for sit<> stand and verbal cues. Pt would benefit from continued acute OT to further her function in mobility and ADLs. CIR d/c to increase indep prior to home.     Follow Up Recommendations  CIR    Equipment Recommendations  3 in 1 bedside commode;Other (comment) (AE (reacher, sockaide))       Precautions / Restrictions Precautions Precautions: Fall Precaution Comments: pt admitted for fall, denies other falls in last 6 months Restrictions Weight Bearing Restrictions: Yes LLE Weight Bearing: Touchdown weight bearing      Mobility Bed Mobility Overal bed mobility: Needs Assistance Bed Mobility: Sit to Supine       Sit to supine: Min guard;HOB elevated        Transfers Overall transfer level: Needs assistance Equipment used: Rolling walker (2 wheeled) Transfers: Sit to/from Stand Sit to Stand: Min assist Stand pivot transfers: Min assist       General transfer comment: required one step directions and constant cueing to adhere to TDWB    Balance Overall balance assessment:  Needs assistance Sitting-balance support: No upper extremity supported Sitting balance-Leahy Scale: Good     Standing balance support: Bilateral upper extremity supported Standing balance-Leahy Scale: Poor             ADL either performed or assessed with clinical judgement   ADL Overall ADL's : Needs assistance/impaired Eating/Feeding: Set up;Sitting   Grooming: Wash/dry hands;Wash/dry face;Oral care;Applying deodorant;Brushing hair;Set up;Sitting   Upper Body Bathing: Set up;Sitting   Lower Body Bathing: Minimal assistance;Sit to/from stand;Cueing for compensatory techniques   Upper Body Dressing : Set up;Sitting   Lower Body Dressing: Moderate assistance;Cueing for compensatory techniques;Sit to/from Database administrator: Cueing for safety;Stand-pivot;RW;Minimal assistance   Toileting- Architect and Hygiene: Sit to/from stand;Cueing for safety;Minimal assistance       Functional mobility during ADLs:  (Upon ambulating wtih RW pt not adhering to TDWB, pt educated and given visaul demonstration however still unable to adhere to TDWB. Pt comlpeted stand pivot with close min guard with rw to transfer char>bed wtih better adherence to TDWB.) General ADL Comments: Pt eager eage to get up and complete ADLs however is unsafe and requirs constant vc                  Pertinent Vitals/Pain Pain Assessment: 0-10 Pain Score: 5  Pain Location: L hip and thigh Pain Descriptors / Indicators: Discomfort;Sore Pain Intervention(s): Limited activity within patient's tolerance;Monitored during session;RN gave pain meds during session     Hand Dominance Right  Extremity/Trunk Assessment Upper Extremity Assessment Upper Extremity Assessment: Overall WFL for tasks assessed;Generalized weakness   Lower Extremity Assessment Lower Extremity Assessment: Defer to PT evaluation   Cervical / Trunk Assessment Cervical / Trunk Assessment: Kyphotic   Communication  Communication Communication: No difficulties   Cognition Arousal/Alertness: Awake/alert Behavior During Therapy: WFL for tasks assessed/performed Overall Cognitive Status: Within Functional Limits for tasks assessed Area of Impairment: Attention;Memory;Following commands;Problem solving                   Current Attention Level: Focused Memory: Decreased short-term memory;Decreased recall of precautions Following Commands: Follows one step commands consistently     Problem Solving: Difficulty sequencing;Requires verbal cues General Comments: pt unable to correctly recall TDWB precautions, required constant cueing and step by step instructions throughout evaluation   General Comments  Pt with bandages at sx site      Home Living Family/patient expects to be discharged to:: Private residence Living Arrangements: Spouse/significant other;Children Available Help at Discharge: Family;Available 24 hours/day Type of Home: House Home Access: Stairs to enter [employer] of Steps: 1 Entrance Stairs-Rails: None Home Layout: One level     Bathroom Shower/Tub: Producer, television/film/video: Handicapped height Bathroom Accessibility: Yes How Accessible: Accessible via walker Home Equipment: Hand held shower head;Grab bars - tub/shower;Walker - 2 wheels;Gilmer Mor - single point      Lives With: Spouse    Prior Functioning/Environment Level of Independence: Independent                 OT Problem List: Decreased strength;Decreased range of motion;Decreased activity tolerance;Impaired balance (sitting and/or standing);Decreased safety awareness;Decreased knowledge of precautions;Decreased knowledge of use of DME or AE;Pain      OT Treatment/Interventions: Self-care/ADL training;Therapeutic exercise;DME and/or AE instruction;Modalities;Therapeutic activities;Patient/family education;Balance training    OT Goals(Current goals can be found in the care plan section)  Acute Rehab OT Goals Patient Stated Goal: return home asap, agreeable to rehab OT Goal Formulation: With patient Time For Goal Achievement: 10/14/20 Potential to Achieve Goals: Good ADL Goals Pt Will Perform Lower Body Dressing: with modified independence;with adaptive equipment;sit to/from stand Pt Will Transfer to Toilet: with modified independence;regular height toilet Pt Will Perform Toileting - Clothing Manipulation and hygiene: with modified independence;sitting/lateral leans Additional ADL Goal #1: Pt will independently recall at least 3 fall prevention strategies for safe transition into the home environment  OT Frequency: Min 2X/week    AM-PAC OT "6 Clicks" Daily Activity     Outcome Measure Help from another person eating meals?: None Help from another person taking care of personal grooming?: A Little Help from another person toileting, which includes using toliet, bedpan, or urinal?: A Little Help from another person bathing (including washing, rinsing, drying)?: A Little Help from another person to put on and taking off regular upper body clothing?: A Little Help from another person to put on and taking off regular lower body clothing?: A Little 6 Click Score: 19   End of Session Equipment Utilized During Treatment: Gait belt;Rolling walker Nurse Communication: Mobility status;Weight bearing status;Other (comment) (Pt wtih soft BM at the start of the session, not adhering to WB status with rw well, needs constant cues)  Activity Tolerance: Patient tolerated treatment well Patient left: in bed;with call bell/phone within reach;with bed alarm set;with family/visitor present  OT Visit Diagnosis: Unsteadiness on feet (R26.81);Other abnormalities of gait and mobility (R26.89);History of falling (Z91.81);Pain Pain - Right/Left: Left Pain - part of body: Leg  Time: 3532-9924 OT Time Calculation (min): 22 min Charges:  OT General Charges $OT Visit: 1 Visit OT  Evaluation $OT Eval Low Complexity: 1 Low    Malacki Mcphearson A Alizee Maple 09/30/2020, 4:01 PM

## 2020-09-30 NOTE — Progress Notes (Signed)
Inpatient Rehab Admissions Coordinator:   I met with Pt. And her husband to discuss potential CIR admit. They state interest and husband can provide 24/7. Pt. Has both medicare A/B and a [employer] policy. I will need to confirm which is primary. I do not have a bed for her on CIR today. I will pursue for potential admit pending medical readiness and bed availability   Clemens Catholic, [place], [place] Admissions Coordinator  [phone] (celll) [phone] (office)

## 2020-09-30 NOTE — Progress Notes (Addendum)
Date and time results received: 09/30/20  At 5am (use smartphrase ".now" to insert current time)  Test: potassium, calcium Critical Value: 7.4, calcium 4.0  Name of Provider Notified: [facility] paged  Orders Received? Or Actions Taken?: waiting for orders

## 2020-09-30 NOTE — Progress Notes (Signed)
PROGRESS NOTE    Mckenzie [name]  HAL:[id number] DOB: 14-Jun-1936 DOA: 09/27/2020 PCP: Royann Shivers, PA-C   Chief Complain: Fall  Brief Narrative: Patient is 85 year old female with history of coronary artery disease status post stenting, COPD, GERD, postsurgical hypothyroidism, mild aortic stenosis, IBS who presented to the emergency department with complaint of left rib pain after having a mechanical fall.  No history of loss of consciousness or head trauma or history of seizures.  In the emergency department hip x-ray showed angulated and overriding intertrochanteric fracture of femur.  [facility].  Underwent ORIF.  PT/OT recommending CIR on discharge.  Patient is medically stable for discharge as soon as bed is available.  Assessment & Plan:   Principal Problem:   Lt Hip Hip fracture -s/p Mechanical Fall Active Problems:   GERD   Mild Aortic valve sclerosis   CAD (coronary artery disease)/Stent to LAD, 2003 [facility]   Hypothyroidism, postsurgical   Irritable bowel syndrome (IBS)   COPD (chronic obstructive pulmonary disease) (HCC)   HTN (hypertension)   Left hip intertrochanteric/subtrochanteric femur fracture: Secondary to mechanical fall.  Status post left hip intramedullary nailing on 4/15.  Continue pain management, DVT prophylaxis.  Follow-up with orthopedics as an outpatient. PT/OT recommended CIR on discharge.  Postoperative blood loss/normocytic anemia: Status posttransfusion with 2 units of PRBC on 4/16.  Hemoglobin currently stable in the range of 7-8.  Leukocytosis: Resolved  Hypertension: Currently blood pressure stable.  Home blood pressure.  Benazepril on hold  History of coronary artery disease: Currently stable.  On Lipitor, aspirin, metoprolol  COPD: Currently not in exacerbation.  Continue bronchodilators as needed.  On room air  Chronic bladder outlet obstruction: Foley catheter.  [facility]  History of postsurgical hypothyroidism: On levothyroxine.  Low level of TSH.  Will check free T4 and free T3.  We will adjust the dose of levothyroxine.           DVT prophylaxis:Aspirin,Heparin [place] Code Status: Full Family Communication: Husband at bedside Status is: Inpatient  Remains inpatient appropriate because:Inpatient level of care appropriate due to severity of illness   Dispo: The patient is from: Home              Anticipated d/c is to: CIR              Patient currently is medically stable to d/c.   Difficult to place patient No    Consultants: Ortho  Procedures:ORIF  Antimicrobials:  Anti-infectives (From admission, onward)   Start     Dose/Rate Route Frequency Ordered Stop   09/28/20 0600  ceFAZolin (ANCEF) IVPB 2g/100 mL premix  Status:  Discontinued        2 g 200 mL/hr over 30 Minutes Intravenous On call to O.R. 09/27/20 1522 09/27/20 2038   09/27/20 2200  ceFAZolin (ANCEF) IVPB 2g/100 mL premix        2 g 200 mL/hr over 30 Minutes Intravenous Every 8 hours 09/27/20 2043 09/28/20 0751      Subjective:  Patient seen and examined at the bedside this afternoon.  Comfortable during my evaluation.  No new complaints.  Husband at the bedside Objective: Vitals:   09/29/20 2041 09/30/20 0623 09/30/20 0721 09/30/20 0832  BP: (!) 134/48 (!) 167/57  (!) 118/42  Pulse: 94 81  77  Resp: 18 19  16   Temp: 98.4 F (36.9 C) 98.2 F (36.8 C)  98.6 F (37 C)  TempSrc: Oral Oral  Oral  SpO2: 98% 100% 99% 97%  Weight:      Height:        Intake/Output Summary (Last 24 hours) at 09/30/2020 1409 Last data filed at 09/30/2020 1601 Gross per 24 hour  Intake 240 ml  Output 400 ml  Net -160 ml   Filed Weights   09/27/20 0909  Weight: 46.7 kg    Examination:  General exam: Overall comfortable, not in distress, pleasant elderly female HEENT: PERRL Respiratory system:  no wheezes or crackles  Cardiovascular system: S1 & S2 heard, RRR.  Murmur  heard Gastrointestinal system: Abdomen is nondistended, soft and nontender. Central nervous system: Alert and oriented Extremities: No edema, no clubbing ,no cyanosis, 2 clean surgical wounds on the left thigh Skin: Scattered bruises, no ulcers,no icterus   Data Reviewed: I have personally reviewed following labs and imaging studies  CBC: Recent Labs  Lab 09/27/20 0919 09/28/20 0139 09/29/20 0206 09/30/20 0333  WBC 13.3* 8.8 9.4 6.6  NEUTROABS 10.6*  --   --   --   HGB 10.4* 6.4* 8.5* 7.9*  HCT 31.7* 19.6* 24.9* 23.1*  MCV 94.3 93.8 89.6 89.9  PLT 175 126* 102* 96*   Basic Metabolic Panel: Recent Labs  Lab 09/27/20 0919 09/28/20 0139 09/29/20 0206 09/30/20 0333 09/30/20 0634  NA 140 137 138 138 138  K 3.4* 3.3* 4.0 >7.5* 4.0  CL 105 107 108 108 108  CO2 26 24 26 22 27   GLUCOSE 115* 191* 121* 91 91  BUN 15 14 15 11 10   CREATININE 0.67 0.73 0.66 0.58 0.54  CALCIUM 8.2* 7.5* 7.8* <4.0* 8.0*  MG  --   --  2.0  --   --    GFR: Estimated Creatinine Clearance: 39.3 mL/min (by C-G formula based on SCr of 0.54 mg/dL). Liver Function Tests: Recent Labs  Lab 09/27/20 0919 09/29/20 0206 09/30/20 0634  AST 27 35 35  ALT 19 18 20   ALKPHOS 45 34* 41  BILITOT 0.8 0.8 1.0  PROT 5.1* 4.6* 4.8*  ALBUMIN 3.2* 2.8* 2.7*   No results for input(s): LIPASE, AMYLASE in the last 168 hours. No results for input(s): AMMONIA in the last 168 hours. Coagulation Profile: No results for input(s): INR, PROTIME in the last 168 hours. Cardiac Enzymes: No results for input(s): CKTOTAL, CKMB, CKMBINDEX, TROPONINI in the last 168 hours. BNP (last 3 results) No results for input(s): PROBNP in the last 8760 hours. HbA1C: No results for input(s): HGBA1C in the last 72 hours. CBG: Recent Labs  Lab 09/28/20 2153 09/28/20 2333 09/29/20 0437  GLUCAP 151* 134* 117*   Lipid Profile: No results for input(s): CHOL, HDL, LDLCALC, TRIG, CHOLHDL, LDLDIRECT in the last 72 hours. Thyroid Function  Tests: Recent Labs    09/30/20 0811  TSH 0.111*   Anemia Panel: No results for input(s): VITAMINB12, FOLATE, FERRITIN, TIBC, IRON, RETICCTPCT in the last 72 hours. Sepsis Labs: No results for input(s): PROCALCITON, LATICACIDVEN in the last 168 hours.  Recent Results (from the past 240 hour(s))  Resp Panel by RT-PCR (Flu A&B, Covid) Nasopharyngeal Swab     Status: None   Collection Time: 09/27/20 11:26 AM   Specimen: Nasopharyngeal Swab; Nasopharyngeal(NP) swabs in vial transport medium  Result Value Ref Range Status   SARS Coronavirus 2 by RT PCR NEGATIVE NEGATIVE Final    Comment: (NOTE) SARS-CoV-2 target nucleic acids are NOT DETECTED.  The SARS-CoV-2 RNA is generally detectable in upper respiratory specimens during the acute  phase of infection. The lowest concentration of SARS-CoV-2 viral copies this assay can detect is 138 copies/mL. A negative result does not preclude SARS-Cov-2 infection and should not be used as the sole basis for treatment or other patient management decisions. A negative result may occur with  improper specimen collection/handling, submission of specimen other than nasopharyngeal swab, presence of viral mutation(s) within the areas targeted by this assay, and inadequate number of viral copies(<138 copies/mL). A negative result must be combined with clinical observations, patient history, and epidemiological information. The expected result is Negative.  Fact Sheet for Patients:  BloggerCourse.com  Fact Sheet for Healthcare Providers:  SeriousBroker.it  This test is no t yet approved or cleared by the [place] FDA and  has been authorized for detection and/or diagnosis of SARS-CoV-2 by FDA under an Emergency Use Authorization (EUA). This EUA will remain  in effect (meaning this test can be used) for the duration of the COVID-19 declaration under Section 564(b)(1) of the Act, 21 U.S.C.section  360bbb-3(b)(1), unless the authorization is terminated  or revoked sooner.       Influenza A by PCR NEGATIVE NEGATIVE Final   Influenza B by PCR NEGATIVE NEGATIVE Final    Comment: (NOTE) The Xpert Xpress SARS-CoV-2/FLU/RSV plus assay is intended as an aid in the diagnosis of influenza from Nasopharyngeal swab specimens and should not be used as a sole basis for treatment. Nasal washings and aspirates are unacceptable for Xpert Xpress SARS-CoV-2/FLU/RSV testing.  Fact Sheet for Patients: BloggerCourse.com  Fact Sheet for Healthcare Providers: SeriousBroker.it  This test is not yet approved or cleared by the [place] FDA and has been authorized for detection and/or diagnosis of SARS-CoV-2 by FDA under an Emergency Use Authorization (EUA). This EUA will remain in effect (meaning this test can be used) for the duration of the COVID-19 declaration under Section 564(b)(1) of the Act, 21 U.S.C. section 360bbb-3(b)(1), unless the authorization is terminated or revoked.  Performed at [facility], [address] [place], [place] [address]          Radiology Studies: No results found.      Scheduled Meds: . aspirin  81 mg Oral BID  . atorvastatin  10 mg Oral Daily  . calcium-vitamin D  2 tablet Oral Daily  . Chlorhexidine Gluconate Cloth  6 each Topical Daily  . cholecalciferol  1,000 Units Oral Daily  . conjugated estrogens  1 Applicatorful Vaginal Daily  . heparin  5,000 Units Subcutaneous Q8H  . levothyroxine  75 mcg Oral QAC breakfast  . metoprolol tartrate  12.5 mg Oral BID  . mometasone-formoterol  2 puff Inhalation BID  . multivitamin with minerals  1 tablet Oral Daily  . pantoprazole  40 mg Oral Daily  . raloxifene  60 mg Oral Daily  . sodium chloride flush  3 mL Intravenous Q12H  . sodium chloride flush  3 mL Intravenous Q12H  . tamsulosin  0.4 mg Oral Daily  . temazepam  30 mg Oral QHS   Continuous  Infusions: . sodium chloride    . methocarbamol (ROBAXIN) IV       LOS: 3 days    Time spent:25 mins, More than 50% of that time was spent in counseling and/or coordination of care.      Burnadette Pop, MD [facility] P4/18/2022, 2:09 PM

## 2020-10-01 LAB — GLUCOSE, CAPILLARY: Glucose-Capillary: 102 mg/dL — ABNORMAL HIGH (ref 70–99)

## 2020-10-01 LAB — T3, FREE: T3, Free: 2.5 pg/mL (ref 2.0–4.4)

## 2020-10-01 MED ORDER — LEVOTHYROXINE SODIUM 25 MCG PO TABS
25.0000 ug | ORAL_TABLET | Freq: Every day | ORAL | Status: DC
  Administered 2020-10-01 – 2020-10-03 (×3): 25 ug via ORAL
  Filled 2020-10-01 (×2): qty 1

## 2020-10-01 NOTE — Progress Notes (Signed)
Physical Therapy Treatment Patient Details Name: Mckenzie [name] MRN: [id number] DOB: [birth year] Today's Date: 10/01/2020    History of Present Illness The pt is an 85 yo female presenting 4/15 after a fall at home with c/o L hip pain, inability to ambulate. Imaging revealed L hip fx and is now s/p IM nail of L femur on 4/15. PMH includes: CAD s/p LAD stent in 2003, HTN, COPD, GERD, and IBS.    PT Comments    Pt fully participated in session. Pt continues to require cueing for weight bearing status. Pt easily fatigued during ambulation. Pt requiring assist for supine exercises with LLE. Pt will benefit from skilled PT to address deficits in balance, strength, pain, gait and endurance to maximize independence with functional mobility prior to discharge.     Follow Up Recommendations  CIR     Equipment Recommendations  3in1 (PT);Wheelchair (measurements PT);Wheelchair cushion (measurements PT)    Recommendations for Other Services       Precautions / Restrictions Precautions Precautions: Fall Precaution Comments: pt admitted for fall, denies other falls in last 6 months Restrictions Weight Bearing Restrictions: Yes LLE Weight Bearing: Touchdown weight bearing    Mobility  Bed Mobility Overal bed mobility: Needs Assistance Bed Mobility: Sit to Supine       Sit to supine: Min assist;HOB elevated   General bed mobility comments: HOB elevated and used bil UE to assist L LE    Transfers Overall transfer level: Needs assistance Equipment used: Rolling walker (2 wheeled) Transfers: Sit to/from Stand Sit to Stand: Min assist         General transfer comment: requiring cueing for weight bearing restrictions and foot placement  Ambulation/Gait Ambulation/Gait assistance: Min assist Gait Distance (Feet): 20 Feet Assistive device: Rolling walker (2 wheeled) Gait Pattern/deviations: Step-to pattern;Decreased stride length;Decreased weight shift to left Gait velocity:  decreased   General Gait Details: continued education required for weight bearing. standing rest break needed due to UE fatigue   Stairs             Wheelchair Mobility    Modified Rankin (Stroke Patients Only)       Balance                                            Cognition                                              Exercises General Exercises - Lower Extremity Heel Slides: AROM;Right;AAROM;Left;10 reps;Supine Hip ABduction/ADduction: AAROM;Left;10 reps;Supine;AROM;Right Straight Leg Raises: AROM;Right;AAROM;Left;10 reps;Supine    General Comments        Pertinent Vitals/Pain Pain Assessment: 0-10 Pain Score: 5     Home Living                      Prior Function            PT Goals (current goals can now be found in the care plan section) Acute Rehab PT Goals Patient Stated Goal: return home asap, agreeable to rehab PT Goal Formulation: With patient/family Time For Goal Achievement: 10/12/20 Potential to Achieve Goals: Fair Progress towards PT goals: Progressing toward goals    Frequency    Min 5X/week  PT Plan Current plan remains appropriate    Co-evaluation              AM-PAC PT "6 Clicks" Mobility   Outcome Measure  Help needed turning from your back to your side while in a flat bed without using bedrails?: A Little Help needed moving from lying on your back to sitting on the side of a flat bed without using bedrails?: A Little Help needed moving to and from a bed to a chair (including a wheelchair)?: A Little Help needed standing up from a chair using your arms (e.g., wheelchair or bedside chair)?: A Little Help needed to walk in hospital room?: A Little Help needed climbing 3-5 steps with a railing? : A Lot 6 Click Score: 17    End of Session Equipment Utilized During Treatment: Gait belt Activity Tolerance: Patient tolerated treatment well Patient left: in bed;with  bed alarm set;with call bell/phone within reach;with family/visitor present Nurse Communication: Mobility status PT Visit Diagnosis: Other abnormalities of gait and mobility (R26.89);Pain;Muscle weakness (generalized) (M62.81) Pain - Right/Left: Left Pain - part of body: Hip     Time: 2841-3244 PT Time Calculation (min) (ACUTE ONLY): 12 min  Charges:  $Gait Training: 8-22 mins                     Mckenzie [name], DPT Acute Rehabilitation Services [phone]   Mckenzie [name] 10/01/2020, 1:54 PM

## 2020-10-01 NOTE — Plan of Care (Signed)
  Problem: Health Behavior/Discharge Planning: Goal: Ability to manage health-related needs will improve Outcome: Progressing   Problem: Clinical Measurements: Goal: Ability to maintain clinical measurements within normal limits will improve Outcome: Progressing Goal: Will remain free from infection Outcome: Progressing   

## 2020-10-01 NOTE — Progress Notes (Addendum)
PROGRESS NOTE    Mckenzie [name]  WUJ:[id number] DOB: 1936/03/07 DOA: 09/27/2020 PCP: Royann Shivers, PA-C   Chief Complain: Fall  Brief Narrative: Patient is 85 year old female with history of coronary artery disease status post stenting, COPD, GERD, postsurgical hypothyroidism, mild aortic stenosis, IBS who presented to the emergency department with complaint of left rib pain after having a mechanical fall.  No history of loss of consciousness or head trauma or history of seizures.  In the emergency department hip x-ray showed angulated and overriding intertrochanteric fracture of femur.  [facility].  Underwent ORIF.  PT/OT recommending CIR on discharge.  Patient is medically stable for discharge as soon as bed is available.  Assessment & Plan:   Principal Problem:   Lt Hip Hip fracture -s/p Mechanical Fall Active Problems:   GERD   Mild Aortic valve sclerosis   CAD (coronary artery disease)/Stent to LAD, 2003 [facility]   Hypothyroidism, postsurgical   Irritable bowel syndrome (IBS)   COPD (chronic obstructive pulmonary disease) (HCC)   HTN (hypertension)   Left hip intertrochanteric/subtrochanteric femur fracture: Secondary to mechanical fall.  Status post left hip intramedullary nailing on 4/15.  Continue pain management, DVT prophylaxis.  Follow-up with orthopedics as an outpatient. PT/OT recommended CIR on discharge.  Postoperative blood loss/normocytic anemia: Status posttransfusion with 2 units of PRBC on 4/16.  Hemoglobin currently stable in the range of 7-8.  Leukocytosis: Resolved  Hypertension: Currently blood pressure stable.  Home blood pressure med benazepril restarted  History of coronary artery disease: Currently stable.  On Lipitor, aspirin, metoprolol  COPD: Currently not in exacerbation.  Continue bronchodilators as needed.  On room air  Chronic bladder outlet obstruction: Foley catheter.  Follows [facility].  History of postsurgical hypothyroidism: On levothyroxine 75 mcg at home.  Low level of TSH,with high T4.  Changed the dosing of  Levothyroxine to 25 mcg.  Cannot stop levothyroxine entirely due to postsurgical hypothyroidism. It is very important to check thyroid function test in 6 -8 weeks.           DVT prophylaxis:Aspirin,Heparin Manter Code Status: Full Family Communication: Husband at bedside on 09/30/20 Status is: Inpatient  Remains inpatient appropriate because:Inpatient level of care appropriate due to severity of illness   Dispo: The patient is from: Home              Anticipated d/c is to: CIR              Patient currently is medically stable to d/c.   Difficult to place patient No    Consultants: Ortho  Procedures:ORIF  Antimicrobials:  Anti-infectives (From admission, onward)   Start     Dose/Rate Route Frequency Ordered Stop   09/28/20 0600  ceFAZolin (ANCEF) IVPB 2g/100 mL premix  Status:  Discontinued        2 g 200 mL/hr over 30 Minutes Intravenous On call to O.R. 09/27/20 1522 09/27/20 2038   09/27/20 2200  ceFAZolin (ANCEF) IVPB 2g/100 mL premix        2 g 200 mL/hr over 30 Minutes Intravenous Every 8 hours 09/27/20 2043 09/28/20 0751      Subjective:  Patient seen and examined the bedside this morning.  Hemodynamically stable.  Comfortable.  Denies any new complaints today.    Objective: Vitals:   09/30/20 2011 09/30/20 2201 10/01/20 0605 10/01/20 0803  BP:  (!) 154/59 (!) 157/61 (!) 175/63  Pulse: 94 84 77  76  Resp: 14  18 17   Temp:   99.1 F (37.3 C) 97.7 F (36.5 C)  TempSrc:   Oral Oral  SpO2: 98%  97% 98%  Weight:      Height:        Intake/Output Summary (Last 24 hours) at 10/01/2020 0816 Last data filed at 09/30/2020 2000 Gross per 24 hour  Intake 120 ml  Output 550 ml  Net -430 ml   Filed Weights   09/27/20 0909  Weight: 46.7 kg    Examination:  General exam: Overall comfortable, not in  distress HEENT: PERRL Respiratory system:  no wheezes or crackles  Cardiovascular system: S1 & S2 heard, RRR.  Gastrointestinal system: Abdomen is nondistended, soft and nontender. Central nervous system: Alert and oriented Extremities: No edema, no clubbing ,no cyanosis, 2 surgical wounds on the left thigh Skin: Scattered purpura, no ulcers  Data Reviewed: I have personally reviewed following labs and imaging studies  CBC: Recent Labs  Lab 09/27/20 0919 09/28/20 0139 09/29/20 0206 09/30/20 0333  WBC 13.3* 8.8 9.4 6.6  NEUTROABS 10.6*  --   --   --   HGB 10.4* 6.4* 8.5* 7.9*  HCT 31.7* 19.6* 24.9* 23.1*  MCV 94.3 93.8 89.6 89.9  PLT 175 126* 102* 96*   Basic Metabolic Panel: Recent Labs  Lab 09/27/20 0919 09/28/20 0139 09/29/20 0206 09/30/20 0333 09/30/20 0634  NA 140 137 138 138 138  K 3.4* 3.3* 4.0 >7.5* 4.0  CL 105 107 108 108 108  CO2 26 24 26 22 27   GLUCOSE 115* 191* 121* 91 91  BUN 15 14 15 11 10   CREATININE 0.67 0.73 0.66 0.58 0.54  CALCIUM 8.2* 7.5* 7.8* <4.0* 8.0*  MG  --   --  2.0  --   --    GFR: Estimated Creatinine Clearance: 39.3 mL/min (by C-G formula based on SCr of 0.54 mg/dL). Liver Function Tests: Recent Labs  Lab 09/27/20 0919 09/29/20 0206 09/30/20 0634  AST 27 35 35  ALT 19 18 20   ALKPHOS 45 34* 41  BILITOT 0.8 0.8 1.0  PROT 5.1* 4.6* 4.8*  ALBUMIN 3.2* 2.8* 2.7*   No results for input(s): LIPASE, AMYLASE in the last 168 hours. No results for input(s): AMMONIA in the last 168 hours. Coagulation Profile: No results for input(s): INR, PROTIME in the last 168 hours. Cardiac Enzymes: No results for input(s): CKTOTAL, CKMB, CKMBINDEX, TROPONINI in the last 168 hours. BNP (last 3 results) No results for input(s): PROBNP in the last 8760 hours. HbA1C: No results for input(s): HGBA1C in the last 72 hours. CBG: Recent Labs  Lab 09/28/20 2153 09/28/20 2333 09/29/20 0437 10/01/20 0008  GLUCAP 151* 134* 117* 102*   Lipid  Profile: No results for input(s): CHOL, HDL, LDLCALC, TRIG, CHOLHDL, LDLDIRECT in the last 72 hours. Thyroid Function Tests: Recent Labs    09/30/20 0811  TSH 0.111*  FREET4 2.27*  T3FREE 2.5   Anemia Panel: No results for input(s): VITAMINB12, FOLATE, FERRITIN, TIBC, IRON, RETICCTPCT in the last 72 hours. Sepsis Labs: No results for input(s): PROCALCITON, LATICACIDVEN in the last 168 hours.  Recent Results (from the past 240 hour(s))  Resp Panel by RT-PCR (Flu A&B, Covid) Nasopharyngeal Swab     Status: None   Collection Time: 09/27/20 11:26 AM   Specimen: Nasopharyngeal Swab; Nasopharyngeal(NP) swabs in vial transport medium  Result Value Ref Range Status   SARS Coronavirus 2 by RT PCR NEGATIVE NEGATIVE Final    Comment: (NOTE)  SARS-CoV-2 target nucleic acids are NOT DETECTED.  The SARS-CoV-2 RNA is generally detectable in upper respiratory specimens during the acute phase of infection. The lowest concentration of SARS-CoV-2 viral copies this assay can detect is 138 copies/mL. A negative result does not preclude SARS-Cov-2 infection and should not be used as the sole basis for treatment or other patient management decisions. A negative result may occur with  improper specimen collection/handling, submission of specimen other than nasopharyngeal swab, presence of viral mutation(s) within the areas targeted by this assay, and inadequate number of viral copies(<138 copies/mL). A negative result must be combined with clinical observations, patient history, and epidemiological information. The expected result is Negative.  Fact Sheet for Patients:  BloggerCourse.com  Fact Sheet for Healthcare Providers:  SeriousBroker.it  This test is no t yet approved or cleared by the [place] FDA and  has been authorized for detection and/or diagnosis of SARS-CoV-2 by FDA under an Emergency Use Authorization (EUA). This EUA will remain   in effect (meaning this test can be used) for the duration of the COVID-19 declaration under Section 564(b)(1) of the Act, 21 U.S.C.section 360bbb-3(b)(1), unless the authorization is terminated  or revoked sooner.       Influenza A by PCR NEGATIVE NEGATIVE Final   Influenza B by PCR NEGATIVE NEGATIVE Final    Comment: (NOTE) The Xpert Xpress SARS-CoV-2/FLU/RSV plus assay is intended as an aid in the diagnosis of influenza from Nasopharyngeal swab specimens and should not be used as a sole basis for treatment. Nasal washings and aspirates are unacceptable for Xpert Xpress SARS-CoV-2/FLU/RSV testing.  Fact Sheet for Patients: BloggerCourse.com  Fact Sheet for Healthcare Providers: SeriousBroker.it  This test is not yet approved or cleared by the [place] FDA and has been authorized for detection and/or diagnosis of SARS-CoV-2 by FDA under an Emergency Use Authorization (EUA). This EUA will remain in effect (meaning this test can be used) for the duration of the COVID-19 declaration under Section 564(b)(1) of the Act, 21 U.S.C. section 360bbb-3(b)(1), unless the authorization is terminated or revoked.  Performed at [facility], [address] [place], [place] [address]          Radiology Studies: No results found.      Scheduled Meds: . aspirin  81 mg Oral BID  . atorvastatin  10 mg Oral Daily  . calcium-vitamin D  2 tablet Oral Daily  . Chlorhexidine Gluconate Cloth  6 each Topical Daily  . cholecalciferol  1,000 Units Oral Daily  . conjugated estrogens  1 Applicatorful Vaginal Daily  . heparin  5,000 Units Subcutaneous Q8H  . levothyroxine  25 mcg Oral Q0600  . metoprolol tartrate  12.5 mg Oral BID  . mometasone-formoterol  2 puff Inhalation BID  . multivitamin with minerals  1 tablet Oral Daily  . pantoprazole  40 mg Oral Daily  . raloxifene  60 mg Oral Daily  . sodium chloride flush  3 mL Intravenous  Q12H  . sodium chloride flush  3 mL Intravenous Q12H  . tamsulosin  0.4 mg Oral Daily  . temazepam  30 mg Oral QHS   Continuous Infusions: . sodium chloride    . methocarbamol (ROBAXIN) IV       LOS: 4 days    Time spent:25 mins, More than 50% of that time was spent in counseling and/or coordination of care.      Burnadette Pop, MD [facility] P4/19/2022, 8:16 AM

## 2020-10-01 NOTE — Progress Notes (Signed)
Inpatient Rehab Admissions Coordinator:    I do not have a CIR bed for this pt. Today. Note that Pt. Received IV med 2x yesterday. I discussed with MD, who is in agreement to work toward weaning pt. To oral pain meds, as CIR cannot give IV pain meds.Pt. also understands. I will continue to follow for potential admission pending bed availability and medical readiness.   Megan Salon, MS, CCC-SLP Rehab Admissions Coordinator  [phone] (celll) [phone] (office)

## 2020-10-01 NOTE — Care Management Important Message (Signed)
Important Message  Patient Details  Name: Mckenzie [name] MRN: [id number] Date of Birth: [birth year]   Medicare Important Message Given:  Yes     Sherrilynn Gudgel Stefan Church 10/01/2020, 4:50 PM

## 2020-10-02 LAB — CBC WITH DIFFERENTIAL/PLATELET
Abs Immature Granulocytes: 0.03 10*3/uL (ref 0.00–0.07)
Basophils Absolute: 0 10*3/uL (ref 0.0–0.1)
Basophils Relative: 0 %
Eosinophils Absolute: 0.1 10*3/uL (ref 0.0–0.5)
Eosinophils Relative: 2 %
HCT: 24.7 % — ABNORMAL LOW (ref 36.0–46.0)
Hemoglobin: 8.2 g/dL — ABNORMAL LOW (ref 12.0–15.0)
Immature Granulocytes: 0 %
Lymphocytes Relative: 23 %
Lymphs Abs: 1.7 10*3/uL (ref 0.7–4.0)
MCH: 30.9 pg (ref 26.0–34.0)
MCHC: 33.2 g/dL (ref 30.0–36.0)
MCV: 93.2 fL (ref 80.0–100.0)
Monocytes Absolute: 0.9 10*3/uL (ref 0.1–1.0)
Monocytes Relative: 12 %
Neutro Abs: 4.6 10*3/uL (ref 1.7–7.7)
Neutrophils Relative %: 63 %
Platelets: 149 10*3/uL — ABNORMAL LOW (ref 150–400)
RBC: 2.65 MIL/uL — ABNORMAL LOW (ref 3.87–5.11)
RDW: 15.4 % (ref 11.5–15.5)
WBC: 7.3 10*3/uL (ref 4.0–10.5)
nRBC: 0 % (ref 0.0–0.2)

## 2020-10-02 MED ORDER — KETOROLAC TROMETHAMINE 15 MG/ML IJ SOLN
15.0000 mg | Freq: Once | INTRAMUSCULAR | Status: AC
  Administered 2020-10-02: 15 mg via INTRAVENOUS
  Filled 2020-10-02: qty 1

## 2020-10-02 MED ORDER — METOPROLOL TARTRATE 25 MG PO TABS
25.0000 mg | ORAL_TABLET | Freq: Two times a day (BID) | ORAL | Status: DC
  Administered 2020-10-02 – 2020-10-03 (×3): 25 mg via ORAL
  Filled 2020-10-02 (×3): qty 1

## 2020-10-02 MED ORDER — BENAZEPRIL HCL 5 MG PO TABS
5.0000 mg | ORAL_TABLET | Freq: Every day | ORAL | Status: DC
  Administered 2020-10-02 – 2020-10-03 (×2): 5 mg via ORAL
  Filled 2020-10-02 (×2): qty 1

## 2020-10-02 MED ORDER — OXYCODONE HCL 5 MG PO TABS
7.5000 mg | ORAL_TABLET | Freq: Four times a day (QID) | ORAL | Status: DC | PRN
Start: 2020-10-02 — End: 2020-10-03
  Administered 2020-10-02 – 2020-10-03 (×3): 7.5 mg via ORAL
  Filled 2020-10-02 (×3): qty 2

## 2020-10-02 NOTE — TOC Progression Note (Signed)
Transition of Care [facility]) - Progression Note    Patient Details  Name: Mckenzie [name] MRN: [id number] Date of Birth: 14-Dec-1935  Transition of [facility]) CM/SW Contact  Epifanio Lesches, RN Phone Number: 10/02/2020, 2:18 PM  Clinical Narrative:    NCM received secured chat message from CIR admission liaison informing NCM no CIR bed available today and probably not tomorrow. States information shared with pt and husband, and they are now open to referral being made with [facility]. Vs SNF. NCM spoke with pt/husband and consent given  to make referral for inpatient rehab with [facility].  NCM called [facility] and referral made.  TOC team will contintue to monitor and follow..  Expected Discharge Plan: IP Rehab Facility Barriers to Discharge: Continued Medical Work up,Insurance Authorization  Expected Discharge Plan and Services Expected Discharge Plan: IP Rehab Facility In-house Referral: Clinical Social Work     Living arrangements for the past 2 months: Single Family Home                                       Social Determinants of Health (SDOH) Interventions    Readmission Risk Interventions No flowsheet data found.

## 2020-10-02 NOTE — Progress Notes (Signed)
Physical Therapy Treatment Patient Details Name: Mckenzie [name] MRN: [id number] DOB: 1936-03-08 Today's Date: 10/02/2020    History of Present Illness The pt is an 85 yo female presenting 4/15 after a fall at home with c/o L hip pain, inability to ambulate. Imaging revealed L hip fx and is now s/p IM nail of L femur on 4/15. PMH includes: CAD s/p LAD stent in 2003, HTN, COPD, GERD, and IBS.    PT Comments    Pt making gradual progress. Does continue to need frequent multimodal cues for TDWB with gait.  Tolerating exercises well with cues for controlled movement.  Did add UE exercises for increased strength to assist with TDWB and RW use. Continue to progress as able.     Follow Up Recommendations  CIR     Equipment Recommendations  3in1 (PT);Wheelchair (measurements PT);Wheelchair cushion (measurements PT)    Recommendations for Other Services       Precautions / Restrictions Precautions Precautions: Fall Precaution Comments: pt admitted for fall, denies other falls in last 6 months Restrictions LLE Weight Bearing: Touchdown weight bearing    Mobility  Bed Mobility               General bed mobility comments: In chair at arrival    Transfers Overall transfer level: Needs assistance Equipment used: Rolling walker (2 wheeled) Transfers: Sit to/from Stand Sit to Stand: Min assist         General transfer comment: requiring cueing for weight bearing restrictions and foot placement  Ambulation/Gait Ambulation/Gait assistance: Min assist Gait Distance (Feet): 30 Feet Assistive device: Rolling walker (2 wheeled) Gait Pattern/deviations: Step-to pattern;Decreased stride length;Decreased weight shift to left Gait velocity: decreased   General Gait Details: Required frequent cues for TDWB status.  PT placed foot under pt's to assess for TDWB and pt needing cues to maintain and use UE.  Required 1 standing rest break. Seems limited due to upper body strength and  coordinating walker use   Stairs             Wheelchair Mobility    Modified Rankin (Stroke Patients Only)       Balance Overall balance assessment: Needs assistance Sitting-balance support: No upper extremity supported Sitting balance-Leahy Scale: Good     Standing balance support: Bilateral upper extremity supported Standing balance-Leahy Scale: Poor Standing balance comment: Requiring RW or leaning on counter when washing hands                            Cognition Arousal/Alertness: Awake/alert Behavior During Therapy: WFL for tasks assessed/performed Overall Cognitive Status: Within Functional Limits for tasks assessed                                 General Comments: She was able to recall and explain TDWB but needed cues with ambulation      Exercises General Exercises - Lower Extremity Ankle Circles/Pumps: AROM;Both;10 reps;Seated Quad Sets: AROM;Both;10 reps;Supine Long Arc Quad: AROM;Both;10 reps;Seated Heel Slides: AROM;Right;AAROM;Left;10 reps;Supine Hip ABduction/ADduction: AAROM;Left;10 reps;Supine;AROM;Right Hip Flexion/Marching: AROM;Right;AAROM;Left;10 reps;Seated Other Exercises Other Exercises: Cues for controlled movement and decreased compensation with exercises Other Exercises: seated tricep push up with L LE guarded to prevent pushing with L Leg 10x2    General Comments        Pertinent Vitals/Pain Pain Assessment: 0-10 Pain Score: 5  Pain Location: L hip and thigh Pain  Descriptors / Indicators: Discomfort;Sore Pain Intervention(s): Limited activity within patient's tolerance;Monitored during session;Repositioned;Ice applied    Home Living                      Prior Function            PT Goals (current goals can now be found in the care plan section) Acute Rehab PT Goals Patient Stated Goal: return home asap, agreeable to rehab PT Goal Formulation: With patient/family Time For Goal  Achievement: 10/12/20 Potential to Achieve Goals: Good Progress towards PT goals: Progressing toward goals    Frequency    Min 5X/week      PT Plan Current plan remains appropriate    Co-evaluation              AM-PAC PT "6 Clicks" Mobility   Outcome Measure  Help needed turning from your back to your side while in a flat bed without using bedrails?: A Little Help needed moving from lying on your back to sitting on the side of a flat bed without using bedrails?: A Little Help needed moving to and from a bed to a chair (including a wheelchair)?: A Little Help needed standing up from a chair using your arms (e.g., wheelchair or bedside chair)?: A Little Help needed to walk in hospital room?: A Little Help needed climbing 3-5 steps with a railing? : A Lot 6 Click Score: 17    End of Session Equipment Utilized During Treatment: Gait belt Activity Tolerance: Patient tolerated treatment well Patient left: in chair;with call bell/phone within reach;with family/visitor present (does not have chair alarm box available, pt in chair at arrival and with spouse present) Nurse Communication: Mobility status PT Visit Diagnosis: Other abnormalities of gait and mobility (R26.89);Pain;Muscle weakness (generalized) (M62.81) Pain - Right/Left: Left Pain - part of body: Hip     Time: 1211-1232 PT Time Calculation (min) (ACUTE ONLY): 21 min  Charges:  $Gait Training: 8-22 mins                     Mckenzie [name], PT Acute Rehab Services Pager [phone] [facility] [phone]     Mckenzie [name] 10/02/2020, 12:44 PM

## 2020-10-02 NOTE — Progress Notes (Signed)
  Subjective: Mckenzie [name] is a 85 y.o. female s/p left Hip IM nail.   Pt's pain is controlled. She has no complaints aside from occasional lateral hip pain. No groin pain generally.  Denies dizziness/lightheadedness, calf pain, SOB. Awaiting dispo to SNF/CIR.    Objective: Vital signs in last 24 hours: Temp:  [97.4 F (36.3 C)-98.2 F (36.8 C)] 97.4 F (36.3 C) (04/20 0900) Pulse Rate:  [76-91] 91 (04/20 0900) Resp:  [16-17] 16 (04/20 0900) BP: (142-162)/(51-63) 142/57 (04/20 0900) SpO2:  [97 %-99 %] 99 % (04/20 0900)  Intake/Output from previous day: 04/19 0701 - 04/20 0700 In: -  Out: 1150 [Urine:1150] Intake/Output this shift: Total I/O In: 240 [P.O.:240] Out: 550 [Urine:550]  Exam:  No gross blood or drainage overlying the dressing aside from mild bleeding that is stable from previous days Left foot warm and well-perfused Sensation intact distally in the left foot Able to dorsiflex and plantarflex the left foot No calf tenderness/ negative homan sign   Labs: Recent Labs    09/30/20 0333 10/02/20 0400  HGB 7.9* 8.2*   Recent Labs    09/30/20 0333 10/02/20 0400  WBC 6.6 7.3  RBC 2.57* 2.65*  HCT 23.1* 24.7*  PLT 96* 149*   Recent Labs    09/30/20 0333 09/30/20 0634  NA 138 138  K >7.5* 4.0  CL 108 108  CO2 22 27  BUN 11 10  CREATININE 0.58 0.54  GLUCOSE 91 91  CALCIUM <4.0* 8.0*   No results for input(s): LABPT, INR in the last 72 hours.  Assessment/Plan: Pt is POD5 s/p left hip IM nail.    -Disposition pending medical team clearance and decision; likely SNF vs CIR depending on bed availability  -Touchdown weight bearing to LLE   -DVT Prophylaxis: ASA BID     Letrell Attwood L Fumiko Cham 10/02/2020, 10:56 AM

## 2020-10-02 NOTE — Progress Notes (Signed)
PROGRESS NOTE    Mckenzie [name]  WNU:[id number] DOB: 1936-06-03 DOA: 09/27/2020 PCP: Royann Shivers, PA-C   Chief Complain: Fall  Brief Narrative: Patient is 85 year old female with history of coronary artery disease status post stenting, COPD, GERD, postsurgical hypothyroidism, mild aortic stenosis, IBS who presented to the emergency department with complaint of left rib pain after having a mechanical fall.  No history of loss of consciousness or head trauma or history of seizures.  In the emergency department hip x-ray showed angulated and overriding intertrochanteric fracture of femur.  [facility].  Underwent ORIF.  PT/OT recommending CIR on discharge.  Patient is medically stable for discharge as soon as bed is available.  Assessment & Plan:   Principal Problem:   Lt Hip Hip fracture -s/p Mechanical Fall Active Problems:   GERD   Mild Aortic valve sclerosis   CAD (coronary artery disease)/Stent to LAD, 2003 [facility]   Hypothyroidism, postsurgical   Irritable bowel syndrome (IBS)   COPD (chronic obstructive pulmonary disease) (HCC)   HTN (hypertension)   Left hip intertrochanteric/subtrochanteric femur fracture: Secondary to mechanical fall.  Status post left hip intramedullary nailing on 4/15.  Continue pain management, DVT prophylaxis.  Follow-up with orthopedics as an outpatient. PT/OT recommended CIR on discharge.  Postoperative blood loss/normocytic anemia: Status posttransfusion with 2 units of PRBC on 4/16.  Hemoglobin currently stable in the range of 7-8.  Leukocytosis: Resolved  Hypertension: Currently blood pressure stable.  Home blood pressure med benazepril restarted  History of coronary artery disease: Currently stable.  On Lipitor, aspirin, metoprolol  COPD: Currently not in exacerbation.  Continue bronchodilators as needed.  On room air  Chronic bladder outlet obstruction: Foley catheter.  [facility]  History of postsurgical hypothyroidism: On levothyroxine 75 mcg at home.  Low level of TSH,with high T4.  Changed the dosing of  Levothyroxine to 25 mcg.  Cannot stop levothyroxine entirely due to postsurgical hypothyroidism. It is very important to check thyroid function test in 6 -8 weeks.           DVT prophylaxis:Aspirin Code Status: Full Family Communication: Husband at bedside on 09/30/20 Status is: Inpatient  Remains inpatient appropriate because:Inpatient level of care appropriate due to severity of illness   Dispo: The patient is from: Home              Anticipated d/c is to: CIR              Patient currently is medically stable to d/c.   Difficult to place patient No    Consultants: Ortho  Procedures:ORIF  Antimicrobials:  Anti-infectives (From admission, onward)   Start     Dose/Rate Route Frequency Ordered Stop   09/28/20 0600  ceFAZolin (ANCEF) IVPB 2g/100 mL premix  Status:  Discontinued        2 g 200 mL/hr over 30 Minutes Intravenous On call to O.R. 09/27/20 1522 09/27/20 2038   09/27/20 2200  ceFAZolin (ANCEF) IVPB 2g/100 mL premix        2 g 200 mL/hr over 30 Minutes Intravenous Every 8 hours 09/27/20 2043 09/28/20 0751      Subjective:  Patient seen and examined at the bedside this morning.  Hemodynamically stable.  Denies any complaints.  Comfortable    Objective: Vitals:   10/01/20 2116 10/02/20 0527 10/02/20 0827 10/02/20 0900  BP: (!) 162/62 (!) 161/63  (!) 142/57  Pulse: 91 77  91  Resp: 17 17  16   Temp: 98 F (36.7 C) 98.2 F (36.8 C)  (!) 97.4 F (36.3 C)  TempSrc: Oral Oral  Oral  SpO2: 98% 97% 97% 99%  Weight:      Height:        Intake/Output Summary (Last 24 hours) at 10/02/2020 1128 Last data filed at 10/02/2020 0900 Gross per 24 hour  Intake 240 ml  Output 1700 ml  Net -1460 ml   Filed Weights   09/27/20 0909  Weight: 46.7 kg    Examination:  General exam: Overall comfortable, not in  distress, pleasant elderly female HEENT: PERRL Respiratory system:  no wheezes or crackles  Cardiovascular system: S1 & S2 heard, RRR.  Gastrointestinal system: Abdomen is nondistended, soft and nontender. Central nervous system: Alert and oriented Extremities: No edema, no clubbing ,no cyanosis, clean surgical wound on the left hip Skin: No  ulcers,no icterus   Data Reviewed: I have personally reviewed following labs and imaging studies  CBC: Recent Labs  Lab 09/27/20 0919 09/28/20 0139 09/29/20 0206 09/30/20 0333 10/02/20 0400  WBC 13.3* 8.8 9.4 6.6 7.3  NEUTROABS 10.6*  --   --   --  4.6  HGB 10.4* 6.4* 8.5* 7.9* 8.2*  HCT 31.7* 19.6* 24.9* 23.1* 24.7*  MCV 94.3 93.8 89.6 89.9 93.2  PLT 175 126* 102* 96* 149*   Basic Metabolic Panel: Recent Labs  Lab 09/27/20 0919 09/28/20 0139 09/29/20 0206 09/30/20 0333 09/30/20 0634  NA 140 137 138 138 138  K 3.4* 3.3* 4.0 >7.5* 4.0  CL 105 107 108 108 108  CO2 26 24 26 22 27   GLUCOSE 115* 191* 121* 91 91  BUN 15 14 15 11 10   CREATININE 0.67 0.73 0.66 0.58 0.54  CALCIUM 8.2* 7.5* 7.8* <4.0* 8.0*  MG  --   --  2.0  --   --    GFR: Estimated Creatinine Clearance: 39.3 mL/min (by C-G formula based on SCr of 0.54 mg/dL). Liver Function Tests: Recent Labs  Lab 09/27/20 0919 09/29/20 0206 09/30/20 0634  AST 27 35 35  ALT 19 18 20   ALKPHOS 45 34* 41  BILITOT 0.8 0.8 1.0  PROT 5.1* 4.6* 4.8*  ALBUMIN 3.2* 2.8* 2.7*   No results for input(s): LIPASE, AMYLASE in the last 168 hours. No results for input(s): AMMONIA in the last 168 hours. Coagulation Profile: No results for input(s): INR, PROTIME in the last 168 hours. Cardiac Enzymes: No results for input(s): CKTOTAL, CKMB, CKMBINDEX, TROPONINI in the last 168 hours. BNP (last 3 results) No results for input(s): PROBNP in the last 8760 hours. HbA1C: No results for input(s): HGBA1C in the last 72 hours. CBG: Recent Labs  Lab 09/28/20 2153 09/28/20 2333 09/29/20 0437  10/01/20 0008  GLUCAP 151* 134* 117* 102*   Lipid Profile: No results for input(s): CHOL, HDL, LDLCALC, TRIG, CHOLHDL, LDLDIRECT in the last 72 hours. Thyroid Function Tests: Recent Labs    09/30/20 0811  TSH 0.111*  FREET4 2.27*  T3FREE 2.5   Anemia Panel: No results for input(s): VITAMINB12, FOLATE, FERRITIN, TIBC, IRON, RETICCTPCT in the last 72 hours. Sepsis Labs: No results for input(s): PROCALCITON, LATICACIDVEN in the last 168 hours.  Recent Results (from the past 240 hour(s))  Resp Panel by RT-PCR (Flu A&B, Covid) Nasopharyngeal Swab     Status: None   Collection Time: 09/27/20 11:26 AM   Specimen: Nasopharyngeal Swab; Nasopharyngeal(NP) swabs in vial transport medium  Result Value Ref Range Status  SARS Coronavirus 2 by RT PCR NEGATIVE NEGATIVE Final    Comment: (NOTE) SARS-CoV-2 target nucleic acids are NOT DETECTED.  The SARS-CoV-2 RNA is generally detectable in upper respiratory specimens during the acute phase of infection. The lowest concentration of SARS-CoV-2 viral copies this assay can detect is 138 copies/mL. A negative result does not preclude SARS-Cov-2 infection and should not be used as the sole basis for treatment or other patient management decisions. A negative result may occur with  improper specimen collection/handling, submission of specimen other than nasopharyngeal swab, presence of viral mutation(s) within the areas targeted by this assay, and inadequate number of viral copies(<138 copies/mL). A negative result must be combined with clinical observations, patient history, and epidemiological information. The expected result is Negative.  Fact Sheet for Patients:  BloggerCourse.comhttps://www.fda.gov/media/152166/download  Fact Sheet for Healthcare Providers:  SeriousBroker.ithttps://www.fda.gov/media/152162/download  This test is no t yet approved or cleared by the [place] FDA and  has been authorized for detection and/or diagnosis of SARS-CoV-2 by FDA under an  Emergency Use Authorization (EUA). This EUA will remain  in effect (meaning this test can be used) for the duration of the COVID-19 declaration under Section 564(b)(1) of the Act, 21 U.S.C.section 360bbb-3(b)(1), unless the authorization is terminated  or revoked sooner.       Influenza A by PCR NEGATIVE NEGATIVE Final   Influenza B by PCR NEGATIVE NEGATIVE Final    Comment: (NOTE) The Xpert Xpress SARS-CoV-2/FLU/RSV plus assay is intended as an aid in the diagnosis of influenza from Nasopharyngeal swab specimens and should not be used as a sole basis for treatment. Nasal washings and aspirates are unacceptable for Xpert Xpress SARS-CoV-2/FLU/RSV testing.  Fact Sheet for Patients: BloggerCourse.comhttps://www.fda.gov/media/152166/download  Fact Sheet for Healthcare Providers: SeriousBroker.ithttps://www.fda.gov/media/152162/download  This test is not yet approved or cleared by the [place] FDA and has been authorized for detection and/or diagnosis of SARS-CoV-2 by FDA under an Emergency Use Authorization (EUA). This EUA will remain in effect (meaning this test can be used) for the duration of the COVID-19 declaration under Section 564(b)(1) of the Act, 21 U.S.C. section 360bbb-3(b)(1), unless the authorization is terminated or revoked.  Performed at [facility], [address] [place], [place] [id number]          Radiology Studies: No results found.      Scheduled Meds: . aspirin  81 mg Oral BID  . atorvastatin  10 mg Oral Daily  . benazepril  5 mg Oral Daily  . calcium-vitamin D  2 tablet Oral Daily  . Chlorhexidine Gluconate Cloth  6 each Topical Daily  . cholecalciferol  1,000 Units Oral Daily  . conjugated estrogens  1 Applicatorful Vaginal Daily  . heparin  5,000 Units Subcutaneous Q8H  . levothyroxine  25 mcg Oral Q0600  . metoprolol tartrate  25 mg Oral BID  . mometasone-formoterol  2 puff Inhalation BID  . multivitamin with minerals  1 tablet Oral Daily  . pantoprazole  40 mg  Oral Daily  . raloxifene  60 mg Oral Daily  . sodium chloride flush  3 mL Intravenous Q12H  . sodium chloride flush  3 mL Intravenous Q12H  . tamsulosin  0.4 mg Oral Daily  . temazepam  30 mg Oral QHS   Continuous Infusions: . sodium chloride    . methocarbamol (ROBAXIN) IV       LOS: 5 days    Time spent:25 mins, More than 50% of that time was spent in counseling and/or coordination of care.  Burnadette Pop, MD [facility] P4/20/2022, 11:28 AM

## 2020-10-02 NOTE — TOC Progression Note (Signed)
Transition of Care [facility]) - Progression Note    Patient Details  Name: Mckenzie [name] MRN: [id number] Date of Birth: 1936/04/02  Transition of Care [facility]) CM/SW Contact  Erin Sons, [place] Phone Number: 10/02/2020, 3:16 PM  Clinical Narrative:     CSW spoke with Elease Hashimoto with [facility] about snf placement. They previously offered on pt but Elease Hashimoto will have to review pt again. She states she will likely be able to review pt in the morning.   Expected Discharge Plan: IP Rehab Facility Barriers to Discharge: Continued Medical Work up,Insurance Authorization  Expected Discharge Plan and Services Expected Discharge Plan: IP Rehab Facility In-house Referral: Clinical Social Work     Living arrangements for the past 2 months: Single Family Home                                       Social Determinants of Health (SDOH) Interventions    Readmission Risk Interventions No flowsheet data found.

## 2020-10-02 NOTE — Progress Notes (Signed)
Inpatient Rehab Admissions Coordinator:   I do not have a bed for Pt. On CIR today. I may not have one tomorrow or Friday either, so I discussed backup options with Pt. Pt. And husband state they are interested in [facility] for acute inpatient rehab or [facility] in [place] for SNF. I notified TOC.  Megan Salon, MS, CCC-SLP Rehab Admissions Coordinator  [phone] (celll) [phone] (office)

## 2020-10-02 NOTE — Plan of Care (Signed)

## 2020-10-02 NOTE — [place] FL2 (Signed)
[place] MEDICAID FL2 LEVEL OF CARE SCREENING TOOL     IDENTIFICATION  Patient Name: Mckenzie [name] Birthdate: April 29, 1936 Sex: female Admission Date (Current Location): 09/27/2020  [facility] and [place] Number:  Producer, television/film/video and Address:  [facility] [place], [address], [place], [place] [address]      Provider Number: [id number]  Attending Physician Name and Address:  Burnadette Pop, MD  Relative Name and Phone Number:  Buna, Cuppett (Spouse)   [phone] (Mobile)    Current Level of Care: Hospital Recommended Level of Care: Skilled Nursing Facility Prior Approval Number:    Date Approved/Denied:   PASRR Number: [id number]  Discharge Plan: SNF    Current Diagnoses: Patient Active Problem List   Diagnosis Date Noted  . Lt Hip Hip fracture -s/p Mechanical Fall 09/27/2020  . COPD (chronic obstructive pulmonary disease) (HCC) 09/27/2020  . HTN (hypertension) 09/27/2020  . Irritable bowel syndrome (IBS) 10/04/2019  . CAP (community acquired pneumonia) 05/06/2018  . Hypersomnolence 05/06/2018  . Acute renal failure (ARF) (HCC) 05/06/2018  . Dehydration 05/06/2018  . Hypotension 05/06/2018  . Current chronic use of systemic steroids 07/04/2015  . Nontoxic multinodular goiter 05/01/2014  . Easy bruisability 08/23/2013  . Occlusion and stenosis of carotid artery without mention of cerebral infarction 03/21/2012  . Hypothyroidism, postsurgical 02/02/2012  . Preoperative clearance   . CAD (coronary artery disease)/Stent to LAD, 2003 [facility]   . Palpitations   . Ejection fraction   . MR (mitral regurgitation)   . Left ventricular hypertrophy   . Mild Aortic valve sclerosis   . Carotid arterial disease (HCC)   . GERD (gastroesophageal reflux disease)   . Hypothyroidism   . ARDS (adult respiratory distress syndrome) (HCC)   . Hemolytic anemia (HCC)   . Diastolic dysfunction   . Shortness of breath   . Back pain   . Hyperlipidemia  09/10/2009  . GERD 09/10/2009    Orientation RESPIRATION BLADDER Height & Weight     Self,Time,Situation,Place  Normal Indwelling catheter Weight: 103 lb (46.7 kg) Height:  5\' 2"  (157.5 cm)  BEHAVIORAL SYMPTOMS/MOOD NEUROLOGICAL BOWEL NUTRITION STATUS      Continent Diet (see d/c summary)  AMBULATORY STATUS COMMUNICATION OF NEEDS Skin   Extensive Assist Verbally Surgical wounds (incision left thigh)                       Personal Care Assistance Level of Assistance  Bathing,Feeding,Dressing Bathing Assistance: Limited assistance Feeding assistance: Independent Dressing Assistance: Limited assistance     Functional Limitations Info  Sight,Hearing,Speech Sight Info: Adequate Hearing Info: Adequate Speech Info: Adequate    SPECIAL CARE FACTORS FREQUENCY  PT (By licensed PT),OT (By licensed OT)     PT Frequency: 5x/week OT Frequency: 5x/week            Contractures Contractures Info: Not present    Additional Factors Info  Code Status,Allergies Code Status Info: Full code Allergies Info: Tetracycline           Current Medications (10/02/2020):  This is the current hospital active medication list Current Facility-Administered Medications  Medication Dose Route Frequency Provider Last Rate Last Admin  . 0.9 %  sodium chloride infusion  250 mL Intravenous PRN Emokpae, Courage, MD      . acetaminophen (TYLENOL) tablet 325-650 mg  325-650 mg Oral Q6H PRN Magnant, Charles L, PA-C   650 mg at 09/29/20 1419  . albuterol (PROVENTIL) (2.5 MG/3ML) 0.083% nebulizer solution 2.5  mg  2.5 mg Nebulization Q2H PRN Emokpae, Courage, MD      . aspirin chewable tablet 81 mg  81 mg Oral BID Magnant, Charles L, PA-C   81 mg at 10/02/20 0909  . atorvastatin (LIPITOR) tablet 10 mg  10 mg Oral Daily Mariea Clonts, Courage, MD   10 mg at 10/02/20 0909  . benazepril (LOTENSIN) tablet 5 mg  5 mg Oral Daily Burnadette Pop, MD   5 mg at 10/02/20 1122  . bisacodyl (DULCOLAX) suppository 10 mg   10 mg Rectal Daily PRN Steenwyk, Yujing Z, RPH      . calcium-vitamin D (OSCAL WITH D) 500-200 MG-UNIT per tablet 2 tablet  2 tablet Oral Daily Shon Hale, MD   2 tablet at 10/02/20 0909  . Chlorhexidine Gluconate Cloth 2 % PADS 6 each  6 each Topical Daily Albertine Grates, MD   6 each at 10/02/20 [phone]  . cholecalciferol (VITAMIN D3) tablet 1,000 Units  1,000 Units Oral Daily Shon Hale, MD   1,000 Units at 10/02/20 0909  . conjugated estrogens (PREMARIN) vaginal cream 1 Applicatorful  1 Applicatorful Vaginal Daily Emokpae, Courage, MD   1 Applicatorful at 10/02/20 0914  . heparin injection 5,000 Units  5,000 Units Subcutaneous Q8H Emokpae, Courage, MD   5,000 Units at 10/02/20 1350  . hydrALAZINE (APRESOLINE) injection 10 mg  10 mg Intravenous Q6H PRN Emokpae, Courage, MD      . HYDROcodone-acetaminophen (NORCO/VICODIN) 5-325 MG per tablet 1-2 tablet  1-2 tablet Oral Q4H PRN Magnant, Charles L, PA-C   2 tablet at 10/02/20 1350  . levothyroxine (SYNTHROID) tablet 25 mcg  25 mcg Oral Q0600 Burnadette Pop, MD   25 mcg at 10/02/20 0525  . loperamide (IMODIUM) capsule 2 mg  2 mg Oral Q6H PRN Albertine Grates, MD   2 mg at 09/30/20 1353  . menthol-cetylpyridinium (CEPACOL) lozenge 3 mg  1 lozenge Oral PRN Magnant, Charles L, PA-C       Or  . phenol (CHLORASEPTIC) mouth spray 1 spray  1 spray Mouth/Throat PRN Magnant, Charles L, PA-C      . methocarbamol (ROBAXIN) tablet 500 mg  500 mg Oral Q6H PRN Magnant, Charles L, PA-C   500 mg at 10/02/20 2725   Or  . methocarbamol (ROBAXIN) 500 mg in dextrose 5 % 50 mL IVPB  500 mg Intravenous Q6H PRN Magnant, Charles L, PA-C      . metoCLOPramide (REGLAN) tablet 5-10 mg  5-10 mg Oral Q8H PRN Magnant, Charles L, PA-C       Or  . metoCLOPramide (REGLAN) injection 5-10 mg  5-10 mg Intravenous Q8H PRN Magnant, Charles L, PA-C      . metoprolol tartrate (LOPRESSOR) tablet 25 mg  25 mg Oral BID Burnadette Pop, MD   25 mg at 10/02/20 0909  . mometasone-formoterol  (DULERA) 100-5 MCG/ACT inhaler 2 puff  2 puff Inhalation BID Shon Hale, MD   2 puff at 10/02/20 0827  . multivitamin with minerals tablet 1 tablet  1 tablet Oral Daily Shon Hale, MD   1 tablet at 10/02/20 0909  . nitroGLYCERIN (NITROSTAT) SL tablet 0.4 mg  0.4 mg Sublingual Q5 min PRN Emokpae, Courage, MD      . ondansetron (ZOFRAN) tablet 4 mg  4 mg Oral Q6H PRN Magnant, Charles L, PA-C       Or  . ondansetron (ZOFRAN) injection 4 mg  4 mg Intravenous Q6H PRN Magnant, Charles L, PA-C      . pantoprazole (  PROTONIX) EC tablet 40 mg  40 mg Oral Daily Mariea Clonts, Courage, MD   40 mg at 10/02/20 0909  . polyethylene glycol (MIRALAX / GLYCOLAX) packet 17 g  17 g Oral Daily PRN Albertine Grates, MD      . raloxifene (EVISTA) tablet 60 mg  60 mg Oral Daily Mariea Clonts, Courage, MD   60 mg at 10/02/20 0909  . sodium chloride flush (NS) 0.9 % injection 3 mL  3 mL Intravenous Q12H Emokpae, Courage, MD   3 mL at 10/02/20 0913  . sodium chloride flush (NS) 0.9 % injection 3 mL  3 mL Intravenous Q12H Emokpae, Courage, MD   3 mL at 10/02/20 0913  . sodium chloride flush (NS) 0.9 % injection 3 mL  3 mL Intravenous PRN Emokpae, Courage, MD      . tamsulosin (FLOMAX) capsule 0.4 mg  0.4 mg Oral Daily Emokpae, Courage, MD   0.4 mg at 10/02/20 0909  . temazepam (RESTORIL) capsule 30 mg  30 mg Oral QHS Shon Hale, MD   30 mg at 10/01/20 2119     Discharge Medications: Please see discharge summary for a list of discharge medications.  Relevant Imaging Results:  Relevant Lab Results:   Additional Information SSN [id number]; pt is vaccinated x 3  Ephriam Turman P Cruise [place], LCSW

## 2020-10-03 ENCOUNTER — Encounter (HOSPITAL_COMMUNITY): Payer: Self-pay | Admitting: Physical Medicine and Rehabilitation

## 2020-10-03 ENCOUNTER — Other Ambulatory Visit: Payer: Self-pay

## 2020-10-03 ENCOUNTER — Inpatient Hospital Stay (HOSPITAL_COMMUNITY)
Admission: RE | Admit: 2020-10-03 | Discharge: 2020-10-11 | DRG: 560 | Disposition: A | Discharge status: Home Health | Payer: Medicare Other | Source: Intra-hospital | Attending: Physical Medicine and Rehabilitation | Admitting: Physical Medicine and Rehabilitation

## 2020-10-03 DIAGNOSIS — S7292XS Unspecified fracture of left femur, sequela: Secondary | ICD-10-CM

## 2020-10-03 DIAGNOSIS — R58 Hemorrhage, not elsewhere classified: Secondary | ICD-10-CM | POA: Diagnosis present

## 2020-10-03 DIAGNOSIS — I2510 Atherosclerotic heart disease of native coronary artery without angina pectoris: Secondary | ICD-10-CM | POA: Diagnosis present

## 2020-10-03 DIAGNOSIS — F5104 Psychophysiologic insomnia: Secondary | ICD-10-CM | POA: Diagnosis present

## 2020-10-03 DIAGNOSIS — K581 Irritable bowel syndrome with constipation: Secondary | ICD-10-CM | POA: Diagnosis not present

## 2020-10-03 DIAGNOSIS — Z87891 Personal history of nicotine dependence: Secondary | ICD-10-CM | POA: Diagnosis not present

## 2020-10-03 DIAGNOSIS — D62 Acute posthemorrhagic anemia: Secondary | ICD-10-CM

## 2020-10-03 DIAGNOSIS — Z79890 Hormone replacement therapy (postmenopausal): Secondary | ICD-10-CM | POA: Diagnosis not present

## 2020-10-03 DIAGNOSIS — R7401 Elevation of levels of liver transaminase levels: Secondary | ICD-10-CM

## 2020-10-03 DIAGNOSIS — G8918 Other acute postprocedural pain: Secondary | ICD-10-CM | POA: Diagnosis not present

## 2020-10-03 DIAGNOSIS — E8809 Other disorders of plasma-protein metabolism, not elsewhere classified: Secondary | ICD-10-CM | POA: Diagnosis present

## 2020-10-03 DIAGNOSIS — I252 Old myocardial infarction: Secondary | ICD-10-CM

## 2020-10-03 DIAGNOSIS — S72142D Displaced intertrochanteric fracture of left femur, subsequent encounter for closed fracture with routine healing: Principal | ICD-10-CM

## 2020-10-03 DIAGNOSIS — E890 Postprocedural hypothyroidism: Secondary | ICD-10-CM | POA: Diagnosis present

## 2020-10-03 DIAGNOSIS — K219 Gastro-esophageal reflux disease without esophagitis: Secondary | ICD-10-CM | POA: Diagnosis present

## 2020-10-03 DIAGNOSIS — Z955 Presence of coronary angioplasty implant and graft: Secondary | ICD-10-CM | POA: Diagnosis not present

## 2020-10-03 DIAGNOSIS — M549 Dorsalgia, unspecified: Secondary | ICD-10-CM | POA: Diagnosis present

## 2020-10-03 DIAGNOSIS — S72002D Fracture of unspecified part of neck of left femur, subsequent encounter for closed fracture with routine healing: Principal | ICD-10-CM

## 2020-10-03 DIAGNOSIS — J449 Chronic obstructive pulmonary disease, unspecified: Secondary | ICD-10-CM | POA: Diagnosis present

## 2020-10-03 DIAGNOSIS — R0989 Other specified symptoms and signs involving the circulatory and respiratory systems: Secondary | ICD-10-CM | POA: Diagnosis not present

## 2020-10-03 DIAGNOSIS — S72002S Fracture of unspecified part of neck of left femur, sequela: Secondary | ICD-10-CM | POA: Diagnosis not present

## 2020-10-03 DIAGNOSIS — E46 Unspecified protein-calorie malnutrition: Secondary | ICD-10-CM | POA: Diagnosis present

## 2020-10-03 DIAGNOSIS — S7292XA Unspecified fracture of left femur, initial encounter for closed fracture: Secondary | ICD-10-CM | POA: Diagnosis present

## 2020-10-03 DIAGNOSIS — G8929 Other chronic pain: Secondary | ICD-10-CM | POA: Diagnosis present

## 2020-10-03 DIAGNOSIS — I10 Essential (primary) hypertension: Secondary | ICD-10-CM | POA: Diagnosis present

## 2020-10-03 DIAGNOSIS — Z79899 Other long term (current) drug therapy: Secondary | ICD-10-CM | POA: Diagnosis not present

## 2020-10-03 DIAGNOSIS — W1830XD Fall on same level, unspecified, subsequent encounter: Secondary | ICD-10-CM

## 2020-10-03 DIAGNOSIS — K589 Irritable bowel syndrome without diarrhea: Secondary | ICD-10-CM | POA: Diagnosis present

## 2020-10-03 DIAGNOSIS — R339 Retention of urine, unspecified: Secondary | ICD-10-CM | POA: Diagnosis present

## 2020-10-03 MED ORDER — NITROFURANTOIN MACROCRYSTAL 50 MG PO CAPS
100.0000 mg | ORAL_CAPSULE | Freq: Every day | ORAL | Status: DC
  Administered 2020-10-03 – 2020-10-10 (×7): 100 mg via ORAL
  Filled 2020-10-03: qty 2
  Filled 2020-10-03: qty 1
  Filled 2020-10-03 (×8): qty 2

## 2020-10-03 MED ORDER — BENAZEPRIL HCL 5 MG PO TABS
5.0000 mg | ORAL_TABLET | Freq: Every day | ORAL | Status: DC
  Administered 2020-10-04 – 2020-10-11 (×8): 5 mg via ORAL
  Filled 2020-10-03 (×9): qty 1

## 2020-10-03 MED ORDER — PROCHLORPERAZINE MALEATE 5 MG PO TABS
5.0000 mg | ORAL_TABLET | Freq: Four times a day (QID) | ORAL | Status: DC | PRN
  Administered 2020-10-03: 10 mg via ORAL
  Filled 2020-10-03: qty 2

## 2020-10-03 MED ORDER — MOMETASONE FURO-FORMOTEROL FUM 100-5 MCG/ACT IN AERO
2.0000 | INHALATION_SPRAY | Freq: Two times a day (BID) | RESPIRATORY_TRACT | Status: DC
  Administered 2020-10-03 – 2020-10-10 (×13): 2 via RESPIRATORY_TRACT
  Filled 2020-10-03: qty 8.8

## 2020-10-03 MED ORDER — OXYCODONE HCL 10 MG PO TABS: 10.0000 mg | ORAL_TABLET | Freq: Four times a day (QID) | ORAL | Status: DC | PRN

## 2020-10-03 MED ORDER — MELATONIN 5 MG PO TABS
10.0000 mg | ORAL_TABLET | Freq: Every day | ORAL | Status: DC
  Administered 2020-10-03 – 2020-10-10 (×8): 10 mg via ORAL
  Filled 2020-10-03 (×8): qty 2

## 2020-10-03 MED ORDER — ONE-DAILY MULTI VITAMINS PO TABS: 1.0000 | ORAL_TABLET | Freq: Every day | ORAL | Status: DC

## 2020-10-03 MED ORDER — BISACODYL 10 MG RE SUPP: 10.0000 mg | Freq: Every day | RECTAL | Status: DC | PRN

## 2020-10-03 MED ORDER — TRAZODONE HCL 50 MG PO TABS
50.0000 mg | ORAL_TABLET | Freq: Every evening | ORAL | Status: DC | PRN
  Administered 2020-10-06 – 2020-10-07 (×2): 50 mg via ORAL
  Filled 2020-10-03 (×2): qty 1

## 2020-10-03 MED ORDER — HYOSCYAMINE SULFATE 0.125 MG SL SUBL
0.1250 mg | SUBLINGUAL_TABLET | Freq: Four times a day (QID) | SUBLINGUAL | Status: DC | PRN
  Administered 2020-10-04: 0.125 mg via ORAL
  Filled 2020-10-03 (×2): qty 1

## 2020-10-03 MED ORDER — ENOXAPARIN SODIUM 30 MG/0.3ML ~~LOC~~ SOLN
30.0000 mg | SUBCUTANEOUS | Status: DC
  Administered 2020-10-03 – 2020-10-10 (×8): 30 mg via SUBCUTANEOUS
  Filled 2020-10-03 (×8): qty 0.3

## 2020-10-03 MED ORDER — METHOCARBAMOL 500 MG PO TABS: 500.0000 mg | ORAL_TABLET | Freq: Four times a day (QID) | ORAL | Status: DC | PRN

## 2020-10-03 MED ORDER — ATORVASTATIN CALCIUM 10 MG PO TABS
10.0000 mg | ORAL_TABLET | Freq: Every day | ORAL | Status: DC
  Administered 2020-10-04 – 2020-10-11 (×8): 10 mg via ORAL
  Filled 2020-10-03 (×8): qty 1

## 2020-10-03 MED ORDER — PROCHLORPERAZINE 25 MG RE SUPP
12.5000 mg | Freq: Four times a day (QID) | RECTAL | Status: DC | PRN
Start: 2020-10-03 — End: 2020-10-11

## 2020-10-03 MED ORDER — ASPIRIN 81 MG PO CHEW
81.0000 mg | CHEWABLE_TABLET | Freq: Every day | ORAL | Status: DC
  Administered 2020-10-04 – 2020-10-11 (×8): 81 mg via ORAL
  Filled 2020-10-03 (×8): qty 1

## 2020-10-03 MED ORDER — ALBUTEROL SULFATE (2.5 MG/3ML) 0.083% IN NEBU: 2.5000 mg | INHALATION_SOLUTION | RESPIRATORY_TRACT | Status: DC | PRN

## 2020-10-03 MED ORDER — ALUM & MAG HYDROXIDE-SIMETH 200-200-20 MG/5ML PO SUSP
30.0000 mL | ORAL | Status: DC | PRN
Start: 2020-10-03 — End: 2020-10-11

## 2020-10-03 MED ORDER — PROCHLORPERAZINE EDISYLATE 10 MG/2ML IJ SOLN: 5.0000 mg | Freq: Four times a day (QID) | INTRAMUSCULAR | Status: DC | PRN

## 2020-10-03 MED ORDER — NITROGLYCERIN 0.4 MG SL SUBL: 0.4000 mg | SUBLINGUAL_TABLET | SUBLINGUAL | Status: DC | PRN

## 2020-10-03 MED ORDER — GUAIFENESIN-DM 100-10 MG/5ML PO SYRP
5.0000 mL | ORAL_SOLUTION | Freq: Four times a day (QID) | ORAL | Status: DC | PRN
  Filled 2020-10-03: qty 10

## 2020-10-03 MED ORDER — LEVOTHYROXINE SODIUM 25 MCG PO TABS
25.0000 ug | ORAL_TABLET | Freq: Every day | ORAL | Status: DC
  Administered 2020-10-04 – 2020-10-11 (×8): 25 ug via ORAL
  Filled 2020-10-03 (×8): qty 1

## 2020-10-03 MED ORDER — POLYETHYLENE GLYCOL 3350 17 G PO PACK: 17.0000 g | PACK | Freq: Every day | ORAL | Status: DC | PRN

## 2020-10-03 MED ORDER — TEMAZEPAM 30 MG PO CAPS: 30.0000 mg | ORAL_CAPSULE | Freq: Every day | ORAL | Status: DC

## 2020-10-03 MED ORDER — ADULT MULTIVITAMIN W/MINERALS CH
1.0000 | ORAL_TABLET | Freq: Every day | ORAL | Status: DC
  Administered 2020-10-04 – 2020-10-11 (×8): 1 via ORAL
  Filled 2020-10-03 (×9): qty 1

## 2020-10-03 MED ORDER — FLEET ENEMA 7-19 GM/118ML RE ENEM: 1.0000 | ENEMA | Freq: Once | RECTAL | Status: DC | PRN

## 2020-10-03 MED ORDER — MENTHOL 3 MG MT LOZG: 1.0000 | LOZENGE | OROMUCOSAL | Status: DC | PRN

## 2020-10-03 MED ORDER — PANTOPRAZOLE SODIUM 40 MG PO TBEC
40.0000 mg | DELAYED_RELEASE_TABLET | Freq: Every day | ORAL | Status: DC
  Administered 2020-10-04 – 2020-10-11 (×8): 40 mg via ORAL
  Filled 2020-10-03 (×8): qty 1

## 2020-10-03 MED ORDER — TAMSULOSIN HCL 0.4 MG PO CAPS
0.4000 mg | ORAL_CAPSULE | Freq: Every day | ORAL | Status: DC
  Administered 2020-10-04 – 2020-10-11 (×8): 0.4 mg via ORAL
  Filled 2020-10-03 (×8): qty 1

## 2020-10-03 MED ORDER — ASPIRIN 81 MG PO CHEW: 81.0000 mg | CHEWABLE_TABLET | Freq: Two times a day (BID) | ORAL | Status: DC

## 2020-10-03 MED ORDER — NALOXEGOL OXALATE 25 MG PO TABS
25.0000 mg | ORAL_TABLET | Freq: Every day | ORAL | Status: DC | PRN
  Filled 2020-10-03: qty 1

## 2020-10-03 MED ORDER — CHLORHEXIDINE GLUCONATE CLOTH 2 % EX PADS
6.0000 | MEDICATED_PAD | Freq: Two times a day (BID) | CUTANEOUS | Status: DC
  Administered 2020-10-03 – 2020-10-11 (×14): 6 via TOPICAL

## 2020-10-03 MED ORDER — POLYETHYLENE GLYCOL 3350 17 G PO PACK
17.0000 g | PACK | Freq: Every day | ORAL | Status: DC
  Administered 2020-10-04 – 2020-10-11 (×7): 17 g via ORAL
  Filled 2020-10-03 (×8): qty 1

## 2020-10-03 MED ORDER — ESTROGENS, CONJUGATED 0.625 MG/GM VA CREA
1.0000 | TOPICAL_CREAM | Freq: Every day | VAGINAL | Status: DC
  Administered 2020-10-04 – 2020-10-11 (×8): 1 via VAGINAL
  Filled 2020-10-03: qty 30

## 2020-10-03 MED ORDER — VITAMIN D 25 MCG (1000 UNIT) PO TABS
1000.0000 [IU] | ORAL_TABLET | Freq: Every day | ORAL | Status: DC
  Administered 2020-10-04 – 2020-10-11 (×8): 1000 [IU] via ORAL
  Filled 2020-10-03 (×8): qty 1

## 2020-10-03 MED ORDER — PHENOL 1.4 % MT LIQD
1.0000 | OROMUCOSAL | Status: DC | PRN
Start: 2020-10-03 — End: 2020-10-11

## 2020-10-03 MED ORDER — ACETAMINOPHEN 325 MG PO TABS
325.0000 mg | ORAL_TABLET | ORAL | Status: DC | PRN
  Administered 2020-10-07 – 2020-10-11 (×3): 650 mg via ORAL
  Filled 2020-10-03 (×3): qty 2

## 2020-10-03 MED ORDER — VITAMIN D 25 MCG (1000 UNIT) PO TABS: 1000.0000 [IU] | ORAL_TABLET | Freq: Every day | ORAL | Status: DC

## 2020-10-03 MED ORDER — HYDROCODONE-ACETAMINOPHEN 10-325 MG PO TABS
1.0000 | ORAL_TABLET | ORAL | Status: DC | PRN
  Administered 2020-10-03 – 2020-10-04 (×4): 1 via ORAL
  Filled 2020-10-03 (×3): qty 1

## 2020-10-03 MED ORDER — DIPHENHYDRAMINE HCL 12.5 MG/5ML PO ELIX: 12.5000 mg | ORAL_SOLUTION | Freq: Four times a day (QID) | ORAL | Status: DC | PRN

## 2020-10-03 MED ORDER — RALOXIFENE HCL 60 MG PO TABS
60.0000 mg | ORAL_TABLET | Freq: Every day | ORAL | Status: DC
  Administered 2020-10-04 – 2020-10-11 (×8): 60 mg via ORAL
  Filled 2020-10-03 (×9): qty 1

## 2020-10-03 MED ORDER — METOPROLOL TARTRATE 25 MG PO TABS
25.0000 mg | ORAL_TABLET | Freq: Two times a day (BID) | ORAL | Status: DC
  Administered 2020-10-03 – 2020-10-11 (×16): 25 mg via ORAL
  Filled 2020-10-03 (×16): qty 1

## 2020-10-03 MED ORDER — CALCIUM CARBONATE-VITAMIN D 500-200 MG-UNIT PO TABS
2.0000 | ORAL_TABLET | Freq: Every day | ORAL | Status: DC
  Administered 2020-10-04 – 2020-10-11 (×8): 2 via ORAL
  Filled 2020-10-03 (×8): qty 2

## 2020-10-03 MED ORDER — ADULT MULTIVITAMIN W/MINERALS CH: 1.0000 | ORAL_TABLET | Freq: Every day | ORAL | Status: DC

## 2020-10-03 MED ORDER — ENOXAPARIN SODIUM 40 MG/0.4ML ~~LOC~~ SOLN
40.0000 mg | SUBCUTANEOUS | Status: DC
Start: 2020-10-03 — End: 2020-10-03

## 2020-10-03 MED ORDER — TEMAZEPAM 15 MG PO CAPS
30.0000 mg | ORAL_CAPSULE | Freq: Every day | ORAL | Status: DC
  Administered 2020-10-03 – 2020-10-10 (×8): 30 mg via ORAL
  Filled 2020-10-03 (×8): qty 2

## 2020-10-03 MED ORDER — TRAMADOL HCL 50 MG PO TABS
50.0000 mg | ORAL_TABLET | Freq: Four times a day (QID) | ORAL | Status: DC | PRN
  Administered 2020-10-04 – 2020-10-08 (×10): 50 mg via ORAL
  Filled 2020-10-03 (×10): qty 1

## 2020-10-03 MED ORDER — LEVOTHYROXINE SODIUM 25 MCG PO TABS: 25.0000 ug | ORAL_TABLET | Freq: Every day | ORAL | Status: DC

## 2020-10-03 NOTE — Progress Notes (Signed)
Physical Therapy Treatment Patient Details Name: Mckenzie [name] MRN: [id number] DOB: September 15, 1935 Today's Date: 10/03/2020    History of Present Illness The pt is an 85 yo female presenting 4/15 after a fall at home with c/o L hip pain, inability to ambulate. Imaging revealed L hip fx and is now s/p IM nail of L femur on 4/15. PMH includes: CAD s/p LAD stent in 2003, HTN, COPD, GERD, and IBS.    PT Comments    Pt sitting in bed, aggreeable to  OOB to recliner.  Pt required min guard to min assistance.  Plan for CIR this pm.     Follow Up Recommendations  CIR     Equipment Recommendations  3in1 (PT);Wheelchair (measurements PT);Wheelchair cushion (measurements PT)    Recommendations for Other Services       Precautions / Restrictions Precautions Precautions: Fall Precaution Comments: pt admitted for fall, denies other falls in last 6 months Restrictions Weight Bearing Restrictions: Yes LLE Weight Bearing: Touchdown weight bearing    Mobility  Bed Mobility Overal bed mobility: Needs Assistance Bed Mobility: Supine to Sit     Supine to sit: Min guard;HOB elevated     General bed mobility comments: Pt required cues to move from supine to sit but no physical assistance.    Transfers Overall transfer level: Needs assistance Equipment used: Rolling walker (2 wheeled) Transfers: Sit to/from Stand Sit to Stand: Min guard         General transfer comment: Cues for hand placement to and from seated surface.  Difficult to tell if patient is maintaining weight bearing during sit to stand transfer.  Ambulation/Gait Ambulation/Gait assistance: Min assist Gait Distance (Feet): 6 Feet Assistive device: Rolling walker (2 wheeled) Gait Pattern/deviations: Step-to pattern;Decreased stride length;Decreased weight shift to left     General Gait Details: Step to pattern with assistance to turn and back to seated surface.   Stairs             Wheelchair Mobility     Modified Rankin (Stroke Patients Only)       Balance Overall balance assessment: Needs assistance Sitting-balance support: No upper extremity supported Sitting balance-Leahy Scale: Good       Standing balance-Leahy Scale: Poor                              Cognition Arousal/Alertness: Awake/alert Behavior During Therapy: WFL for tasks assessed/performed Overall Cognitive Status: Within Functional Limits for tasks assessed                                        Exercises General Exercises - Lower Extremity Ankle Circles/Pumps: AROM;Both;10 reps;Supine Quad Sets: AROM;Left;10 reps;Supine Heel Slides: AAROM;Left;10 reps;Supine Hip ABduction/ADduction: AAROM;Left;10 reps;Supine Straight Leg Raises: AAROM;Left;10 reps;Supine    General Comments        Pertinent Vitals/Pain Pain Assessment: 0-10 Pain Location: L hip and thigh Pain Descriptors / Indicators: Discomfort;Sore Pain Intervention(s): Monitored during session;Repositioned    Home Living                      Prior Function            PT Goals (current goals can now be found in the care plan section) Acute Rehab PT Goals Patient Stated Goal: return home asap, agreeable to rehab Potential to Achieve Goals: Good  Progress towards PT goals: Progressing toward goals    Frequency    Min 5X/week      PT Plan Current plan remains appropriate    Co-evaluation              AM-PAC PT "6 Clicks" Mobility   Outcome Measure  Help needed turning from your back to your side while in a flat bed without using bedrails?: A Little Help needed moving from lying on your back to sitting on the side of a flat bed without using bedrails?: A Little Help needed moving to and from a bed to a chair (including a wheelchair)?: A Little Help needed standing up from a chair using your arms (e.g., wheelchair or bedside chair)?: A Little Help needed to walk in hospital room?: A  Little Help needed climbing 3-5 steps with a railing? : A Lot 6 Click Score: 17    End of Session Equipment Utilized During Treatment: Gait belt Activity Tolerance: Patient tolerated treatment well Patient left: in chair;with call bell/phone within reach;with family/visitor present (no chair alarm box in room but alarm pad in seat.) Nurse Communication: Mobility status PT Visit Diagnosis: Other abnormalities of gait and mobility (R26.89);Pain;Muscle weakness (generalized) (M62.81)     Time: 1914-7829 PT Time Calculation (min) (ACUTE ONLY): 26 min  Charges:  $Therapeutic Exercise: 8-22 mins $Therapeutic Activity: 8-22 mins                     Mckenzie [name] , PTA Acute Rehabilitation Services Pager [phone] Office [phone]     Mckenzie [name] Artis Delay 10/03/2020, 5:10 PM

## 2020-10-03 NOTE — H&P (Shared)
Physical Medicine and Rehabilitation Admission H&P    Chief Complaint  Patient presents with  . Functional deficits due to fall.     HPI: Mckenzie [name] is an 85 year old female with history of CAD, COPD, chronic UTIs/indewlling foley - on macrodantin, IBS- constipation, chronic LBP w/ left foot drop; who was admitted on 09/27/20 after fall with left hip IT fracture. She underwent IM fixation of left hip and post op TDWB w/ low dose ASA bid for DVT prophylaxis. She was found to have ABLA with hgb 6.4 pm 04/16 and transfused with 2 units PRBC with improvement in H/H to 8.2/24.7 as well as improvement resolution of hypotension.  Synthroid was decreased to from 75 mcg to 25 mcg due to history of postsurgical hypothyroidism. Therapy has been ongoing and patient limited by TDWB status, balance deficits and weakness. CIR recommended due to functional decline.    Review of Systems  Constitutional: Negative for chills and fever.  HENT: Positive for hearing loss. Negative for tinnitus.   Eyes: Negative for blurred vision and double vision.  Respiratory: Negative for cough and shortness of breath.   Cardiovascular: Negative for chest pain and palpitations.  Gastrointestinal: Positive for constipation. Negative for heartburn and nausea.  Genitourinary: Negative for dysuria and urgency.  Musculoskeletal: Positive for back pain, joint pain, myalgias and neck pain.  Skin: Negative for rash.  Neurological: Positive for focal weakness (tends to drag left foot) and weakness. Negative for dizziness and headaches.  Psychiatric/Behavioral: The patient has insomnia (has been getting up at 2 am).      Past Medical History:  Diagnosis Date  . Aortic valve sclerosis    Mild, echo, April, 2012  . ARDS (adult respiratory distress syndrome) (HCC) 2003   with tracheostomy-peritonitis  . Arthritis   . Back pain    Need for surgery, Dr. Trey Sailors  . CAD (coronary artery disease)    Stent to LAD, 2003  [facility] / nuclear scan April, 201 to artifact / persistent shortness of breath / catheterization Oct 21, 2010... widely patent stent to the mid LAD, no significant obstructive disease, should be low risk    for back surgery  . Carotid arterial disease (HCC)    Moderate-Dr. Myra Gianotti following  . Chronic back pain   . COPD (chronic obstructive pulmonary disease) (HCC)   . Diastolic dysfunction    Echo, April, 2011   EF 65%  . Dysrhythmia    afib  . Ejection fraction    EF 65%, echo, September 15, 2010  . GERD (gastroesophageal reflux disease)   . Hemolytic anemia (HCC)    Followed by Dr. Derald Macleod  . Hypertension   . Hypothyroidism   . Left ventricular hypertrophy    EF 65%, echo, April, 2011  . MR (mitral regurgitation)     Mild echo, 2012 ///  Mild per Echo 2009,  mild, echo, April, 2011  . Myocardial infarction (HCC)   . Palpitations    Holter, September 16, 2010, PACs and PVCs,one 4 beat run of SVT  . Pneumonia 11-2011  . Preoperative clearance    Preop clearance for back surgery  . Shortness of breath    March, 2012  . UTI (urinary tract infection)    Chronic-on Macrodantin    Past Surgical History:  Procedure Laterality Date  . APPENDECTOMY    . BACK SURGERY    . BREAST LUMPECTOMY Left   . CATARACT EXTRACTION W/PHACO Right 12/05/2012   Procedure: CATARACT EXTRACTION  PHACO AND INTRAOCULAR LENS PLACEMENT (IOC);  Surgeon: Gemma Payor, MD;  Location: [facility];  Service: Ophthalmology;  Laterality: Right;  CDE:15.30  . CATARACT EXTRACTION W/PHACO Left 12/15/2012   Procedure: CATARACT EXTRACTION PHACO AND INTRAOCULAR LENS PLACEMENT (IOC);  Surgeon: Gemma Payor, MD;  Location: [facility];  Service: Ophthalmology;  Laterality: Left;  CDE:22.56  . CORONARY ANGIOPLASTY WITH STENT PLACEMENT  2003  . INTRAMEDULLARY (IM) NAIL INTERTROCHANTERIC Left 09/27/2020   Procedure: INTRAMEDULLARY (IM) NAIL INTERTROCHANTRIC;  Surgeon: Cammy Copa, MD;  Location: [facility] OR;  Service: Orthopedics;  Laterality: Left;   left trochanter  . repair of ruptured lumbar discs  12-2011  . SPINE SURGERY     lumbar disk removal by Dr. Laurian Brim [facility]  . THYROID SURGERY  08/2009   Benign adenomas  . TONSILLECTOMY    . TRACHEOSTOMY  2003   Following ARDS    Family History  Problem Relation Age of Onset  . Heart failure Father   . Diabetes Son     Social History:  reports that she quit smoking about 20 years ago. Her smoking use included cigarettes. She has a 20.00 pack-year smoking history. She has never used smokeless tobacco. She reports that she does not drink alcohol and does not use drugs.    Allergies  Allergen Reactions  . Tetracycline Nausea Only    Medications Prior to Admission  Medication Sig Dispense Refill  . atorvastatin (LIPITOR) 10 MG tablet Take 10 mg by mouth daily.     . benazepril (LOTENSIN) 5 MG tablet Take 5 mg by mouth daily.    . Calcium Carbonate (CALCIUM 600 PO) Take 1,200 mg by mouth daily.    . Cholecalciferol (VITAMIN D3) 1000 UNITS CAPS Take 1,000 Units by mouth daily. Take one tab daily    . conjugated estrogens (PREMARIN) vaginal cream Place 1 Applicatorful vaginally daily.    . diphenhydrAMINE (BENADRYL) 25 mg capsule Take 25 mg by mouth daily as needed for sleep.    Marland Kitchen esomeprazole (NEXIUM) 40 MG capsule Take 40 mg by mouth daily before breakfast.    . HYDROcodone-acetaminophen (NORCO) 10-325 MG tablet Take 1 tablet by mouth every 4 (four) hours.    . hyoscyamine (LEVSIN SL) 0.125 MG SL tablet Take 0.125 mg by mouth every 6 (six) hours as needed for cramping or diarrhea or loose stools.    Marland Kitchen levothyroxine (SYNTHROID, LEVOTHROID) 75 MCG tablet Take 75 mcg by mouth daily before breakfast.    . Melatonin 10 MG TABS Take 1 tablet by mouth at bedtime.    . metoprolol tartrate (LOPRESSOR) 25 MG tablet TAKE (1) TABLET TWICE DAILY. (Patient taking differently: Take 25 mg by mouth 2 (two) times daily.) 60 tablet 0  . MOVANTIK 25 MG TABS tablet Take 25 mg by mouth daily.     . Multiple Vitamin (MULTIVITAMIN) tablet Take 1 tablet by mouth daily.    . polyethylene glycol (MIRALAX / GLYCOLAX) 17 g packet Take 17 g by mouth every morning.    . raloxifene (EVISTA) 60 MG tablet Take 60 mg by mouth daily.    . tamsulosin (FLOMAX) 0.4 MG CAPS capsule Take 0.4 mg by mouth daily.    . temazepam (RESTORIL) 30 MG capsule Take 30 mg by mouth at bedtime.    . traZODone (DESYREL) 50 MG tablet Take 50 mg by mouth at bedtime as needed for sleep.    Marland Kitchen tretinoin (RETIN-A) 0.05 % cream Apply 1 application topically at bedtime.    Marland Kitchen  nitroGLYCERIN (NITROSTAT) 0.4 MG SL tablet Place one tablet under tongue every 5 minutes up to 3 doses as needed for chest pain. No more than 3 doses over a 15 minute period. 100 tablet 0    Drug Regimen Review { DRUG REGIMEN [id number]}  Home: Home Living Family/patient expects to be discharged to:: Private residence Living Arrangements: Spouse/significant other,Children Available Help at Discharge: Family,Available 24 hours/day Type of Home: House Home Access: Stairs to enter [employer] of Steps: 1 Entrance Stairs-Rails: None Home Layout: One level Bathroom Shower/Tub: Health visitor: Handicapped height Bathroom Accessibility: Yes Home Equipment: Hand held shower head,Grab bars - tub/shower,Walker - 2 wheels,Cane - single point  Lives With: Spouse   Functional History: Prior Function Level of Independence: Independent  Functional Status:  Mobility: Bed Mobility Overal bed mobility: Needs Assistance Bed Mobility: Sit to Supine Supine to sit: Min guard,HOB elevated Sit to supine: Min assist,HOB elevated General bed mobility comments: In chair at arrival Transfers Overall transfer level: Needs assistance Equipment used: Rolling walker (2 wheeled) Transfers: Sit to/from Stand Sit to Stand: Min assist Stand pivot transfers: Min assist General transfer comment: requiring cueing for weight bearing  restrictions and foot placement Ambulation/Gait Ambulation/Gait assistance: Min assist Gait Distance (Feet): 30 Feet Assistive device: Rolling walker (2 wheeled) Gait Pattern/deviations: Step-to pattern,Decreased stride length,Decreased weight shift to left General Gait Details: Required frequent cues for TDWB status.  PT placed foot under pt's to assess for TDWB and pt needing cues to maintain and use UE.  Required 1 standing rest break. Seems limited due to upper body strength and coordinating walker use Gait velocity: decreased    ADL: ADL Overall ADL's : Needs assistance/impaired Eating/Feeding: Set up,Sitting Grooming: Wash/dry hands,Wash/dry face,Oral care,Applying deodorant,Brushing hair,Set up,Sitting Upper Body Bathing: Set up,Sitting Lower Body Bathing: Minimal assistance,Sit to/from stand,Cueing for compensatory techniques Upper Body Dressing : Set up,Sitting Lower Body Dressing: Moderate assistance,Cueing for compensatory techniques,Sit to/from Pharmacist, hospital: Cueing for safety,Stand-pivot,RW,Minimal assistance Toileting- Architect and Hygiene: Sit to/from stand,Cueing for safety,Minimal assistance Functional mobility during ADLs:  (Upon ambulating wtih RW pt not adhering to TDWB, pt educated and given visaul demonstration however still unable to adhere to TDWB. Pt comlpeted stand pivot with close min guard with rw to transfer char>bed wtih better adherence to TDWB.) General ADL Comments: Pt eager eage to get up and complete ADLs however is unsafe and requirs constant vc  Cognition: Cognition Overall Cognitive Status: Within Functional Limits for tasks assessed Orientation Level: Oriented X4 Cognition Arousal/Alertness: Awake/alert Behavior During Therapy: WFL for tasks assessed/performed Overall Cognitive Status: Within Functional Limits for tasks assessed Area of Impairment: Attention,Memory,Following commands,Problem solving Current Attention Level:  Focused Memory: Decreased short-term memory,Decreased recall of precautions Following Commands: Follows one step commands consistently Problem Solving: Difficulty sequencing,Requires verbal cues General Comments: She was able to recall and explain TDWB but needed cues with ambulation   Blood pressure (!) 165/64, pulse 88, temperature 98.1 F (36.7 C), temperature source Oral, resp. rate 15, height 5\' 2"  (1.575 m), weight 46.7 kg, SpO2 98 %. Physical Exam Vitals and nursing note reviewed.  Constitutional:      Appearance: She is cachectic.  Genitourinary:    Comments: Foley in place with leg bag Musculoskeletal:     Comments: Extensive ecchymosis from left flank to perineum and down inner thigh. Diffuse ecchymosis right forearm medially. Left hip incisions with dry dressings.   Neurological:     Mental Status: She is alert and oriented to person, place, and  time.     Comments: Decreased hearing compensated by hearing aids.  LLE-   DF 4/5     Results for orders placed or performed during the hospital encounter of 09/27/20 (from the past 48 hour(s))  CBC with Differential/Platelet     Status: Abnormal   Collection Time: 10/02/20  4:00 AM  Result Value Ref Range   WBC 7.3 4.0 - 10.5 K/uL   RBC 2.65 (L) 3.87 - 5.11 MIL/uL   Hemoglobin 8.2 (L) 12.0 - 15.0 g/dL   HCT 16.124.7 (L) 09.636.0 - 04.546.0 %   MCV 93.2 80.0 - 100.0 fL   MCH 30.9 26.0 - 34.0 pg   MCHC 33.2 30.0 - 36.0 g/dL   RDW 40.915.4 81.111.5 - 91.415.5 %   Platelets 149 (L) 150 - 400 K/uL   nRBC 0.0 0.0 - 0.2 %   Neutrophils Relative % 63 %   Neutro Abs 4.6 1.7 - 7.7 K/uL   Lymphocytes Relative 23 %   Lymphs Abs 1.7 0.7 - 4.0 K/uL   Monocytes Relative 12 %   Monocytes Absolute 0.9 0.1 - 1.0 K/uL   Eosinophils Relative 2 %   Eosinophils Absolute 0.1 0.0 - 0.5 K/uL   Basophils Relative 0 %   Basophils Absolute 0.0 0.0 - 0.1 K/uL   Immature Granulocytes 0 %   Abs Immature Granulocytes 0.03 0.00 - 0.07 K/uL    Comment: Performed at [facility], [address] [place], [place] [address]   No results found.     Medical Problem List and Plan: 1.  *** secondary to ***  -patient may *** shower  -ELOS/Goals: *** 2.  Antithrombotics: -DVT/anticoagulation:  Pharmaceutical: Lovenox  Instead of heparin tid   -antiplatelet therapy: ASA--decrease to daily with Lovenox on board.  3. Chronic LBP/Pain Management: Was taking hydrocodone 10 mg 6 x day PTA  --on Oxycodone prn-->will resume home regimen every 4 hours prn and monitor  4. Mood: LCSW to follow for evaluation and support.   -antipsychotic agents: N/a 5. Neuropsych: This patient is capable of making decisions on her own behalf. 6. Skin/Wound Care: Routine pressure relief measures.  7. Fluids/Electrolytes/Nutrition: Monitor I/O. Check lytes in am.  8. Left hip fracture s/p fixation 04/15: To be TDWB 9. HTN: Monitor BP tid--trending upwards 10. Acute blood loss anemia: Will check CBC in am  --add iron supplement.  11. Malnutrition: Will add protein supplement to help promote wound healing.  12. COPD: Stable on Dulera bid. Encourage IS.  13. CAD s/p stent: Monitor for symptoms with increase in activity.   --on low dose ASA and atorvastatin.  14. HTN: Monitor BP tid--continue Lisinopril and metoprolol.   --titrate as indicated once mobilized.   15. IBS-constipation: Had small BM for first time today  --resume Miralax daily with movantik prn(patient aware to ask) 16. BOO/Urinary retention: Has had a foley since last summer per Dr. Annabell HowellsWrenn  --resume macrodantin for UTI prophylaxis.  17. Chronic insomnia: Continue Restoril--resume home dose trazodone and melatonin (been getting up at 2 am) 18. Post surgical thyroidism: Synthroid decreased from 75 mcg to 25 mcg-->will need TSH repeated in 4 weeks.   ***  Jacquelynn Creeamela S Love, PA-C 10/03/2020

## 2020-10-03 NOTE — H&P (Signed)
Physical Medicine and Rehabilitation Admission H&P    Chief Complaint  Patient presents with  . Functional deficits due to fall.     HPI: Mckenzie [name] is an 85 year old female with history of CAD, COPD, chronic UTIs/indewlling foley - on macrodantin, IBS- constipation, chronic LBP w/ left foot drop; who was admitted on 09/27/20 after fall with left hip IT fracture. She underwent IM fixation of left hip and post op TDWB w/ low dose ASA bid for DVT prophylaxis. She was found to have ABLA with hgb 6.4 pm 04/16 and transfused with 2 units PRBC with improvement in H/H to 8.2/24.7 as well as improvement resolution of hypotension.  Synthroid was decreased to from 75 mcg to 25 mcg due to history of postsurgical hypothyroidism. Therapy has been ongoing and patient limited by TDWB status, balance deficits and weakness. CIR recommended due to functional decline. Husband at bedside.    Review of Systems  Constitutional: Negative for chills and fever.  HENT: Positive for hearing loss. Negative for tinnitus.   Eyes: Negative for blurred vision and double vision.  Respiratory: Negative for cough and shortness of breath.   Cardiovascular: Negative for chest pain and palpitations.  Gastrointestinal: Positive for constipation. Negative for heartburn and nausea.  Genitourinary: Negative for dysuria and urgency.  Musculoskeletal: Positive for back pain, joint pain, myalgias and neck pain.  Skin: Negative for rash.  Neurological: Positive for focal weakness (tends to drag left foot) and weakness. Negative for dizziness and headaches.  Psychiatric/Behavioral: The patient has insomnia (has been getting up at 2 am).      Past Medical History:  Diagnosis Date  . Aortic valve sclerosis    Mild, echo, April, 2012  . ARDS (adult respiratory distress syndrome) (HCC) 2003   with tracheostomy-peritonitis  . Arthritis   . Back pain    Need for surgery, Dr. Trey Sailors  . CAD (coronary artery disease)     Stent to LAD, 2003 [facility] / nuclear scan April, 201 to artifact / persistent shortness of breath / catheterization Oct 21, 2010... widely patent stent to the mid LAD, no significant obstructive disease, should be low risk    for back surgery  . Carotid arterial disease (HCC)    Moderate-Dr. Myra Gianotti following  . Chronic back pain   . COPD (chronic obstructive pulmonary disease) (HCC)   . Diastolic dysfunction    Echo, April, 2011   EF 65%  . Dysrhythmia    afib  . Ejection fraction    EF 65%, echo, September 15, 2010  . GERD (gastroesophageal reflux disease)   . Hemolytic anemia (HCC)    Followed by Dr. Derald Macleod  . Hypertension   . Hypothyroidism   . Left ventricular hypertrophy    EF 65%, echo, April, 2011  . MR (mitral regurgitation)     Mild echo, 2012 ///  Mild per Echo 2009,  mild, echo, April, 2011  . Myocardial infarction (HCC)   . Palpitations    Holter, September 16, 2010, PACs and PVCs,one 4 beat run of SVT  . Pneumonia 11-2011  . Preoperative clearance    Preop clearance for back surgery  . Shortness of breath    March, 2012  . UTI (urinary tract infection)    Chronic-on Macrodantin    Past Surgical History:  Procedure Laterality Date  . APPENDECTOMY    . BACK SURGERY    . BREAST LUMPECTOMY Left   . CATARACT EXTRACTION W/PHACO Right 12/05/2012  Procedure: CATARACT EXTRACTION PHACO AND INTRAOCULAR LENS PLACEMENT (IOC);  Surgeon: Gemma PayorKerry Hunt, MD;  Location: [facility];  Service: Ophthalmology;  Laterality: Right;  CDE:15.30  . CATARACT EXTRACTION W/PHACO Left 12/15/2012   Procedure: CATARACT EXTRACTION PHACO AND INTRAOCULAR LENS PLACEMENT (IOC);  Surgeon: Gemma PayorKerry Hunt, MD;  Location: [facility];  Service: Ophthalmology;  Laterality: Left;  CDE:22.56  . CORONARY ANGIOPLASTY WITH STENT PLACEMENT  2003  . INTRAMEDULLARY (IM) NAIL INTERTROCHANTERIC Left 09/27/2020   Procedure: INTRAMEDULLARY (IM) NAIL INTERTROCHANTRIC;  Surgeon: Cammy Copaean, Gregory Scott, MD;  Location[facility];  Service: Orthopedics;   Laterality: Left;  left trochanter  . repair of ruptured lumbar discs  12-2011  . SPINE SURGERY     lumbar disk removal [facility]. Laurian [facility]im[facility]Toole [facility]  . THYROID SURGERY  08/2009   Benign adenomas  . TONSILLECTOMY    . TRACHEOSTOMY  2003   Following ARDS    Family History  Problem Relation Age of Onset  . Heart failure Father   . Diabetes Son     Social History:  reports that she quit smoking about 20 years ago. Her smoking use included cigarettes. She has a 20.00 pack-year smoking history. She has never used smokeless tobacco. She reports that she does not drink alcohol and does not use drugs.    Allergies  Allergen Reactions  . Tetracycline Nausea Only    Medications Prior to Admission  Medication Sig Dispense Refill  . atorvastatin (LIPITOR) 10 MG tablet Take 10 mg by mouth daily.     . benazepril (LOTENSIN) 5 MG tablet Take 5 mg by mouth daily.    . Calcium Carbonate (CALCIUM 600 PO) Take 1,200 mg by mouth daily.    . Cholecalciferol (VITAMIN D3) 1000 UNITS CAPS Take 1,000 Units by mouth daily. Take one tab daily    . conjugated estrogens (PREMARIN) vaginal cream Place 1 Applicatorful vaginally daily.    . diphenhydrAMINE (BENADRYL) 25 mg capsule Take 25 mg by mouth daily as needed for sleep.    Marland Kitchen. esomeprazole (NEXIUM) 40 MG capsule Take 40 mg by mouth daily before breakfast.    . HYDROcodone-acetaminophen (NORCO) 10-325 MG tablet Take 1 tablet by mouth every 4 (four) hours.    . hyoscyamine (LEVSIN SL) 0.125 MG SL tablet Take 0.125 mg by mouth every 6 (six) hours as needed for cramping or diarrhea or loose stools.    Marland Kitchen. levothyroxine (SYNTHROID, LEVOTHROID) 75 MCG tablet Take 75 mcg by mouth daily before breakfast.    . Melatonin 10 MG TABS Take 1 tablet by mouth at bedtime.    . metoprolol tartrate (LOPRESSOR) 25 MG tablet TAKE (1) TABLET TWICE DAILY. (Patient taking differently: Take 25 mg by mouth 2 (two) times daily.) 60 tablet 0  . MOVANTIK 25 MG TABS tablet Take 25  mg by mouth daily.    . Multiple Vitamin (MULTIVITAMIN) tablet Take 1 tablet by mouth daily.    . polyethylene glycol (MIRALAX / GLYCOLAX) 17 g packet Take 17 g by mouth every morning.    . raloxifene (EVISTA) 60 MG tablet Take 60 mg by mouth daily.    . tamsulosin (FLOMAX) 0.4 MG CAPS capsule Take 0.4 mg by mouth daily.    . temazepam (RESTORIL) 30 MG capsule Take 30 mg by mouth at bedtime.    . traZODone (DESYREL) 50 MG tablet Take 50 mg by mouth at bedtime as needed for sleep.    Marland Kitchen. tretinoin (RETIN-A) 0.05 % cream Apply 1 application topically at bedtime.    .Marland Kitchen  nitroGLYCERIN (NITROSTAT) 0.4 MG SL tablet Place one tablet under tongue every 5 minutes up to 3 doses as needed for chest pain. No more than 3 doses over a 15 minute period. 100 tablet 0    Drug Regimen Review  Drug regimen was reviewed and remains appropriate with no significant issues identified  Home: Home Living Family/patient expects to be discharged to:: Private residence Living Arrangements: Spouse/significant other,Children   Functional History:    Functional Status:  Mobility:          ADL:    Cognition:       Blood pressure (!) 144/59, pulse 79, temperature 98.3 F (36.8 C), temperature source Oral, resp. rate 18, height 5\' 2"  (1.575 m), SpO2 97 %. Physical Exam Gen: no distress, cachectic.  HEENT: oral mucosa pink and moist, NCAT Cardio: Reg rate Chest: normal effort, normal rate of breathing Abd: soft, non-distended Ext: no edema Psych: pleasant, normal affect Skin: intact Genitourinary:    Comments: Foley in place with leg bag Musculoskeletal:     Comments: Extensive ecchymosis from left flank to perineum and down inner thigh. Diffuse ecchymosis right forearm medially. Left hip incisions with dry dressings.   Neurological:     Mental Status: She is alert and oriented to person, place, and time.     Comments: Decreased hearing compensated by hearing aids.  LLE-   DF 4/5     Results for  orders placed or performed during the hospital encounter of 09/27/20 (from the past 48 hour(s))  CBC with Differential/Platelet     Status: Abnormal   Collection Time: 10/02/20  4:00 AM  Result Value Ref Range   WBC 7.3 4.0 - 10.5 K/uL   RBC 2.65 (L) 3.87 - 5.11 MIL/uL   Hemoglobin 8.2 (L) 12.0 - 15.0 g/dL   HCT 10/04/20 (L) 16.0 - 10.9 %   MCV 93.2 80.0 - 100.0 fL   MCH 30.9 26.0 - 34.0 pg   MCHC 33.2 30.0 - 36.0 g/dL   RDW 32.3 55.7 - 32.2 %   Platelets 149 (L) 150 - 400 K/uL   nRBC 0.0 0.0 - 0.2 %   Neutrophils Relative % 63 %   Neutro Abs 4.6 1.7 - 7.7 K/uL   Lymphocytes Relative 23 %   Lymphs Abs 1.7 0.7 - 4.0 K/uL   Monocytes Relative 12 %   Monocytes Absolute 0.9 0.1 - 1.0 K/uL   Eosinophils Relative 2 %   Eosinophils Absolute 0.1 0.0 - 0.5 K/uL   Basophils Relative 0 %   Basophils Absolute 0.0 0.0 - 0.1 K/uL   Immature Granulocytes 0 %   Abs Immature Granulocytes 0.03 0.00 - 0.07 K/uL    Comment: Performed at [facility], [address] [place], [place]   No results found.     Medical Problem List and Plan: 1. Left femur fracture  -patient may shower but incision must be covered  -ELOS/Goals: modI 10-12 days  -Admit to CIR 2.  Antithrombotics: -DVT/anticoagulation:  Pharmaceutical: Lovenox  Instead of heparin tid   -antiplatelet therapy: ASA--decrease to daily with Lovenox on board.  3. Chronic LBP/Pain Management: Was taking hydrocodone 10 mg 6 x day PTA  --on Oxycodone prn-->will resume home regimen every 4 hours prn and monitor  4. Mood: LCSW to follow for evaluation and support.   -antipsychotic agents: N/a 5. Neuropsych: This patient is capable of making decisions on her own behalf. 6. Skin/Wound Care: Routine pressure relief measures.  7. Fluids/Electrolytes/Nutrition:  Monitor I/O. Check lytes in am.  8. Left hip fracture s/p fixation 04/15: To be TDWB 9. HTN: Monitor BP tid--trending upwards 10. Acute blood loss anemia: Will check CBC in  am  --add iron supplement. Check iron level tomorrow.  11. Malnutrition: Will add protein supplement to help promote wound healing.  12. COPD: Stable on Dulera bid. Encourage IS.  13. CAD s/p stent: Monitor for symptoms with increase in activity.   --on low dose ASA and atorvastatin.  14. HTN: Monitor BP tid--continue Lisinopril and metoprolol.   --titrate as indicated once mobilized.   15. IBS-constipation: Had small BM for first time today  --resume Miralax daily with movantik prn(patient aware to ask) 16. BOO/Urinary retention: Has had a foley since last summer per Dr. Annabell Howells  --resume macrodantin for UTI prophylaxis.  17. 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 Vitamin D screening: check level tomorrow given fracture  I have personally performed a face to face diagnostic evaluation, including, but not limited to relevant history and physical exam findings, of this patient and developed relevant assessment and plan.  Additionally, I have reviewed and concur with the physician assistant's documentation above.  Horton Chin, MD 10/03/2020   Jacquelynn Cree, PA-C

## 2020-10-03 NOTE — Progress Notes (Signed)
Raulkar, Krutika P, MD  Physician  Physical Medicine and Rehabilitation  PMR Pre-admission    Signed  Date of Service:  09/29/2020 1:25 PM      Related encounter: ED to Hosp-Admission (Current) from 09/27/2020 in [facility]       Signed          Show:Clear all [x]Manual[x]Template[x]Copied  Added by: [x]Logue, Eugenia M, RN[x]Raulkar, Krutika P, MD[x]Staley, Laura B, CCC-SLP   []Hover for details  PMR Admission Coordinator Pre-Admission Assessment  Patient: Mckenzie [name] is an 85 y.o., female MRN: [id number] DOB: 02/02/1936 Height: 5' 2" (157.5 cm) Weight: 46.7 kg  Insurance Information HMO:     PPO:      PCP:      IPA:      80/20: yes     OTHER:  PRIMARY: Medicare Part A and B      Policy#: [id number]     Subscriber: Pt  Pre-Cert#: verified online      Employer:  Benefits:  Phone #:      Name:  Eff. Date: 08/14/02     Deduct: $1556      Out of Pocket Max: n/a      Life Max: n/a CIR: 100%      SNF: 20 full days and 80 copay days Outpatient: 80%     Co-Pay: 20% Home Health: 100%      Co-Pay:  DME: 80%     Co-Pay: 20% Providers: pt choice  SECONDARY: UHC Indemnity      Policy#: [id number]     Phone#:   Financial Counselor:       Phone#:   The "Data Collection Information Summary" for patients in Inpatient Rehabilitation Facilities with attached "Privacy Act Statement-Health Care Records" was provided and verbally reviewed with: Patient  Emergency Contact Information         Contact Information    Name Relation Home Work Mobile   [name],Mckenzie Spouse [phone]  [phone]      Current Medical History  Patient Admitting Diagnosis: L Hip fracture  History of Present Illness:The pt is an 84 yo female with PMH of CAD s/p LAD stent in 2003, HTN, COPD, GERD, and IBS presented 09/27/20 after a fall at home with c/o L hip pain, inability to ambulate. Imaging revealed L hip fx and is now s/p IM nail of L  femur on 4/15. Course complicated by ABL (Pt. Received 2 units of PRBCs 09/28/20.), Hypotension, and hypokalemia   Patient's medical record from [facility] has been reviewed by the rehabilitation admission coordinator and physician.  Past Medical History      Past Medical History:  Diagnosis Date  . Aortic valve sclerosis    Mild, echo, April, 2012  . ARDS (adult respiratory distress syndrome) (HCC) 2003   with tracheostomy-peritonitis  . Arthritis   . Back pain    Need for surgery, Dr. Mark Roy  . CAD (coronary artery disease)    Stent to LAD, 2003 [facility] / nuclear scan April, 201 to artifact / persistent shortness of breath / catheterization Oct 21, 2010... widely patent stent to the mid LAD, no significant obstructive disease, should be low risk    for back surgery  . Carotid arterial disease (HCC)    Moderate-Dr. Brabham following  . Chronic back pain   . COPD (chronic obstructive pulmonary disease) (HCC)   . Diastolic dysfunction    Echo, April, 2011   EF 65%  . Dysrhythmia      afib  . Ejection fraction    EF 65%, echo, September 15, 2010  . GERD (gastroesophageal reflux disease)   . Hemolytic anemia (HCC)    Followed by Dr. Darvosky  . Hypertension   . Hypothyroidism   . Left ventricular hypertrophy    EF 65%, echo, April, 2011  . MR (mitral regurgitation)     Mild echo, 2012 ///  Mild per Echo 2009,  mild, echo, April, 2011  . Myocardial infarction (HCC)   . Palpitations    Holter, September 16, 2010, PACs and PVCs,one 4 beat run of SVT  . Pneumonia 11-2011  . Preoperative clearance    Preop clearance for back surgery  . Shortness of breath    March, 2012  . UTI (urinary tract infection)    Chronic-on Macrodantin    Family History   family history includes Diabetes in her son; Heart failure in her father.  Prior Rehab/Hospitalizations Has the patient had prior rehab or hospitalizations prior to admission?  No  Has the patient had major surgery during 100 days prior to admission? Yes             Current Medications  Current Facility-Administered Medications:  .  0.9 %  sodium chloride infusion, 250 mL, Intravenous, PRN, Emokpae, Courage, MD .  acetaminophen (TYLENOL) tablet 325-650 mg, 325-650 mg, Oral, Q6H PRN, Magnant, Charles L, PA-C, 650 mg at 10/03/20 0328 .  albuterol (PROVENTIL) (2.5 MG/3ML) 0.083% nebulizer solution 2.5 mg, 2.5 mg, Nebulization, Q2H PRN, Emokpae, Courage, MD .  aspirin chewable tablet 81 mg, 81 mg, Oral, BID, Magnant, Charles L, PA-C, 81 mg at 10/03/20 1042 .  atorvastatin (LIPITOR) tablet 10 mg, 10 mg, Oral, Daily, Emokpae, Courage, MD, 10 mg at 10/03/20 1042 .  benazepril (LOTENSIN) tablet 5 mg, 5 mg, Oral, Daily, Adhikari, Amrit, MD, 5 mg at 10/03/20 1043 .  bisacodyl (DULCOLAX) suppository 10 mg, 10 mg, Rectal, Daily PRN, [place], Yujing Z, RPH .  calcium-vitamin D (OSCAL WITH D) 500-200 MG-UNIT per tablet 2 tablet, 2 tablet, Oral, Daily, Emokpae, Courage, MD, 2 tablet at 10/03/20 1042 .  Chlorhexidine Gluconate Cloth 2 % PADS 6 each, 6 each, Topical, Daily, Xu, Fang, MD, 6 each at 10/03/20 1043 .  cholecalciferol (VITAMIN D3) tablet 1,000 Units, 1,000 Units, Oral, Daily, Emokpae, Courage, MD, 1,000 Units at 10/03/20 1043 .  conjugated estrogens (PREMARIN) vaginal cream 1 Applicatorful, 1 Applicatorful, Vaginal, Daily, Emokpae, Courage, MD, 1 Applicatorful at 10/02/20 0914 .  heparin injection 5,000 Units, 5,000 Units, Subcutaneous, Q8H, Emokpae, Courage, MD, 5,000 Units at 10/03/20 0531 .  hydrALAZINE (APRESOLINE) injection 10 mg, 10 mg, Intravenous, Q6H PRN, Emokpae, Courage, MD .  levothyroxine (SYNTHROID) tablet 25 mcg, 25 mcg, Oral, Q0600, Adhikari, Amrit, MD, 25 mcg at 10/03/20 0531 .  loperamide (IMODIUM) capsule 2 mg, 2 mg, Oral, Q6H PRN, Xu, Fang, MD, 2 mg at 09/30/20 1353 .  menthol-cetylpyridinium (CEPACOL) lozenge 3 mg, 1 lozenge, Oral, PRN **OR**  phenol (CHLORASEPTIC) mouth spray 1 spray, 1 spray, Mouth/Throat, PRN, Magnant, Charles L, PA-C .  methocarbamol (ROBAXIN) tablet 500 mg, 500 mg, Oral, Q6H PRN, 500 mg at 10/02/20 1746 **OR** methocarbamol (ROBAXIN) 500 mg in dextrose 5 % 50 mL IVPB, 500 mg, Intravenous, Q6H PRN, Magnant, Charles L, PA-C .  metoCLOPramide (REGLAN) tablet 5-10 mg, 5-10 mg, Oral, Q8H PRN **OR** metoCLOPramide (REGLAN) injection 5-10 mg, 5-10 mg, Intravenous, Q8H PRN, Magnant, Charles L, PA-C .  metoprolol tartrate (LOPRESSOR) tablet 25 mg, 25 mg, Oral, BID, Adhikari,   Amrit, MD, 25 mg at 10/03/20 1042 .  mometasone-formoterol (DULERA) 100-5 MCG/ACT inhaler 2 puff, 2 puff, Inhalation, BID, [place] Brick, Courage, MD, 2 puff at 10/03/20 0811 .  multivitamin with minerals tablet 1 tablet, 1 tablet, Oral, Daily, Emokpae, Courage, MD, 1 tablet at 10/03/20 1042 .  nitroGLYCERIN (NITROSTAT) SL tablet 0.4 mg, 0.4 mg, Sublingual, Q5 min PRN, Emokpae, Courage, MD .  ondansetron (ZOFRAN) tablet 4 mg, 4 mg, Oral, Q6H PRN **OR** ondansetron (ZOFRAN) injection 4 mg, 4 mg, Intravenous, Q6H PRN, Magnant, Charles L, PA-C .  oxyCODONE (Oxy IR/ROXICODONE) immediate release tablet 7.5 mg, 7.5 mg, Oral, Q6H PRN, Tawanna Solo, Amrit, MD, 7.5 mg at 10/03/20 1045 .  pantoprazole (PROTONIX) EC tablet 40 mg, 40 mg, Oral, Daily, Emokpae, Courage, MD, 40 mg at 10/03/20 1042 .  polyethylene glycol (MIRALAX / GLYCOLAX) packet 17 g, 17 g, Oral, Daily PRN, Florencia Reasons, MD .  raloxifene (EVISTA) tablet 60 mg, 60 mg, Oral, Daily, Emokpae, Courage, MD, 60 mg at 10/03/20 1043 .  sodium chloride flush (NS) 0.9 % injection 3 mL, 3 mL, Intravenous, Q12H, Emokpae, Courage, MD, 3 mL at 10/02/20 2028 .  sodium chloride flush (NS) 0.9 % injection 3 mL, 3 mL, Intravenous, Q12H, Emokpae, Courage, MD, 3 mL at 10/03/20 1044 .  sodium chloride flush (NS) 0.9 % injection 3 mL, 3 mL, Intravenous, PRN, Emokpae, Courage, MD .  tamsulosin (FLOMAX) capsule 0.4 mg, 0.4 mg, Oral, Daily,  Emokpae, Courage, MD, 0.4 mg at 10/03/20 1042 .  temazepam (RESTORIL) capsule 30 mg, 30 mg, Oral, QHS, Emokpae, Courage, MD, 30 mg at 10/02/20 2027  Patients Current Diet:     Diet Order                  Diet - low sodium heart healthy            Diet regular Room service appropriate? Yes; Fluid consistency: Thin  Diet effective now                  Precautions / Restrictions Precautions Precautions: Fall Precaution Comments: pt admitted for fall, denies other falls in last 6 months Restrictions Weight Bearing Restrictions: Yes LLE Weight Bearing: Touchdown weight bearing   Has the patient had 2 or more falls or a fall with injury in the past year? Yes  Prior Activity Level Community (5-7x/wk): Pt was active in the community PTA  Prior Functional Level Self Care: Did the patient need help bathing, dressing, using the toilet or eating? Independent  Indoor Mobility: Did the patient need assistance with walking from room to room (with or without device)? Independent  Stairs: Did the patient need assistance with internal or external stairs (with or without device)? Independent  Functional Cognition: Did the patient need help planning regular tasks such as shopping or remembering to take medications? [place] Devices/Equipment: Cane (specify quad or straight),Eyeglasses,Wheelchair,Walker (specify type) Home Equipment: Hand held shower head,Grab bars - tub/shower,Walker - 2 wheels,Cane - single point  Prior Device Use: Indicate devices/aids used by the patient prior to current illness, exacerbation or injury? None of the above  Current Functional Level Cognition  Overall Cognitive Status: Within Functional Limits for tasks assessed Current Attention Level: Focused Orientation Level: Oriented X4 Following Commands: Follows one step commands consistently General Comments: She was able to recall and  explain TDWB but needed cues with ambulation    Extremity Assessment (includes Sensation/Coordination)  Upper Extremity Assessment: Overall WFL for tasks  assessed,Generalized weakness  Lower Extremity Assessment: Defer to PT evaluation LLE Deficits / Details: limited by TDWB, pt able to demo good movement against gravity at knee and ankle LLE Sensation: WNL    ADLs  Overall ADL's : Needs assistance/impaired Eating/Feeding: Set up,Sitting Grooming: Wash/dry hands,Wash/dry face,Oral care,Applying deodorant,Brushing hair,Set up,Sitting Upper Body Bathing: Set up,Sitting Lower Body Bathing: Minimal assistance,Sit to/from stand,Cueing for compensatory techniques Upper Body Dressing : Set up,Sitting Lower Body Dressing: Moderate assistance,Cueing for compensatory techniques,Sit to/from stand Toilet Transfer: Cueing for safety,Stand-pivot,RW,Minimal assistance Toileting- Clothing Manipulation and Hygiene: Sit to/from stand,Cueing for safety,Minimal assistance Functional mobility during ADLs:  (Upon ambulating wtih RW pt not adhering to TDWB, pt educated and given visaul demonstration however still unable to adhere to TDWB. Pt comlpeted stand pivot with close min guard with rw to transfer char>bed wtih better adherence to TDWB.) General ADL Comments: Pt eager eage to get up and complete ADLs however is unsafe and requirs constant vc    Mobility  Overal bed mobility: Needs Assistance Bed Mobility: Sit to Supine Supine to sit: Min guard,HOB elevated Sit to supine: Min assist,HOB elevated General bed mobility comments: In chair at arrival    Transfers  Overall transfer level: Needs assistance Equipment used: Rolling walker (2 wheeled) Transfers: Sit to/from Stand Sit to Stand: Min assist Stand pivot transfers: Min assist General transfer comment: requiring cueing for weight bearing restrictions and foot placement    Ambulation / Gait / Stairs / Wheelchair Mobility   Ambulation/Gait Ambulation/Gait assistance: Min assist Gait Distance (Feet): 30 Feet Assistive device: Rolling walker (2 wheeled) Gait Pattern/deviations: Step-to pattern,Decreased stride length,Decreased weight shift to left General Gait Details: Required frequent cues for TDWB status.  PT placed foot under pt's to assess for TDWB and pt needing cues to maintain and use UE.  Required 1 standing rest break. Seems limited due to upper body strength and coordinating walker use Gait velocity: decreased    Posture / Balance Balance Overall balance assessment: Needs assistance Sitting-balance support: No upper extremity supported Sitting balance-Leahy Scale: Good Standing balance support: Bilateral upper extremity supported Standing balance-Leahy Scale: Poor Standing balance comment: Requiring RW or leaning on counter when washing hands    Special needs/care consideration Skin Post op left hip incision with dressing   Previous Home Environment (from acute therapy documentation) Living Arrangements: Spouse/significant other,Children  Lives With: Spouse Available Help at Discharge: Family,Available 24 hours/day Type of Home: House Home Layout: One level Home Access: Stairs to enter Entrance Stairs-Rails: None Entrance Stairs-Number of Steps: 1 Bathroom Shower/Tub: Walk-in shower Bathroom Toilet: Handicapped height Bathroom Accessibility: Yes How Accessible: Accessible via walker Home Care Services: No  Discharge Living Setting Plans for Discharge Living Setting: Patient's home Type of Home at Discharge: House Discharge Home Layout: One level Discharge Home Access: Stairs to enter Entrance Stairs-Rails: None Entrance Stairs-Number of Steps: 1 Discharge Bathroom Shower/Tub: Walk-in shower Discharge Bathroom Toilet: Handicapped height Discharge Bathroom Accessibility: Yes How Accessible: Accessible via walker Does the patient have any problems obtaining your medications?:  No  Social/Family/Support Systems Patient Roles: Spouse Contact Information: [phone] Anticipated Caregiver: Mckenzie Sweitzer Anticipated Caregiver's Contact Information: [phone] Ability/Limitations of Caregiver: Can provide min A Caregiver Availability: 24/7 Discharge Plan Discussed with Primary Caregiver: Yes Is Caregiver In Agreement with Plan?: Yes Does Caregiver/Family have Issues with Lodging/Transportation while Pt is in Rehab?: No  Goals Patient/Family Goal for Rehab: PT/OT Supervision to mod I goals Expected length of stay: 7-10 days Pt/Family Agrees to Admission and willing to participate: Yes   Program Orientation Provided & Reviewed with Pt/Caregiver Including Roles  & Responsibilities: Yes  Decrease burden of Care through IP rehab admission: Specialzed equipment needs, Decrease number of caregivers, Bowel and bladder program and Patient/family education  Possible need for SNF placement upon discharge: not anticipated  Patient Condition: I have reviewed medical records from [facility], spoken with CM, and patient and family member. I met with patient at the bedside and discussed via phone for inpatient rehabilitation assessment.  Patient will benefit from ongoing PT and OT, can actively participate in 3 hours of therapy a day 5 days of the week, and can make measurable gains during the admission.  Patient will also benefit from the coordinated team approach during an Inpatient Acute Rehabilitation admission.  The patient will receive intensive therapy as well as Rehabilitation physician, nursing, social worker, and care management interventions.  Due to safety, skin/wound care, disease management, medication administration, pain management and patient education the patient requires 24 hour a day rehabilitation nursing.  The patient is currently min assist with mobility and basic ADLs.  Discharge setting and therapy post discharge at home with home health  is anticipated.  Patient has agreed to participate in the Acute Inpatient Rehabilitation Program and will admit today.  Preadmission Screen Completed By:  Eugenia M Logue, 10/03/2020 11:19 AM ______________________________________________________________________   Discussed status with Dr. Swartz and Dr. Raulkar on 10/03/20 at 0930 and received approval for admission today.  Admission Coordinator:  Eugenia M Logue, RN, time 1119/Date 10/03/20   Assessment/Plan: Diagnosis: left hip fracture 1. Does the need for close, 24 hr/day Medical supervision in concert with the patient's rehab needs make it unreasonable for this patient to be served in a less intensive setting? Yes 2. Co-Morbidities requiring supervision/potential complications:  1. GERD 2. Mild aortic valve sclerosis 3. CAD 4. Hyopothyroidism 5. Left ventricular hypertrophy 3. Due to bladder management, bowel management, safety, skin/wound care, disease management, medication administration, pain management and patient education, does the patient require 24 hr/day rehab nursing? Yes 4. Does the patient require coordinated care of a physician, rehab nurse, PT, OT to address physical and functional deficits in the context of the above medical diagnosis(es)? Yes Addressing deficits in the following areas: balance, endurance, locomotion, strength, transferring, bowel/bladder control, bathing, dressing, feeding, grooming, toileting and psychosocial support 5. Can the patient actively participate in an intensive therapy program of at least 3 hrs of therapy 5 days a week? Yes 6. The potential for patient to make measurable gains while on inpatient rehab is excellent 7. Anticipated functional outcomes upon discharge from inpatient rehab: modified independent PT, modified independent OT, independent SLP 8. Estimated rehab length of stay to reach the above functional goals is: 10-12 days 9. Anticipated discharge destination: Home 10. Overall  Rehab/Functional Prognosis: excellent   MD Signature: Krutika Raulkar, MD          Revision History                                  Note Details  Author Raulkar, Krutika P, MD File Time 10/03/2020 11:48 AM  Author Type Physician Status Signed  Last Editor Raulkar, Krutika P, MD Service Physical Medicine and Rehabilitation    

## 2020-10-03 NOTE — Progress Notes (Signed)
Inpatient Rehabilitation  Patient information reviewed and entered into eRehab system by Gershon Shorten M. Tyshika Baldridge, M.A., CCC/SLP, PPS Coordinator.  Information including medical coding, functional ability and quality indicators will be reviewed and updated through discharge.    

## 2020-10-03 NOTE — Progress Notes (Signed)
[facility] IP rehab admissions - I met with patient and her husband.  They prefer inpatient rehab here at [facility].  I have a bed available on CIR today.  Will admit to CIR today.  Call me for questions.  [phone]

## 2020-10-03 NOTE — Progress Notes (Signed)
Pt transferred to 5C04 as ordered. Pt remains alert/oriented in no apparent distress. No complaints voiced.

## 2020-10-03 NOTE — Consult Note (Signed)
   [facility] Inpatient Consult   10/03/2020  Mckenzie [name] 08-17-1935 [id number]   [facility] [[facility]]  Accountable Care Organization [ACO] Patient: Medicare CMS DCE  Primary Care Provider listed as: [facility]    Patient screened for post hospital transition.  Review of patient's medical record reveals patient is admitting to [facility] inpatient rehabilitation.  Plan:  Continue to follow progress and disposition to assess for post hospital care management needs.    For questions contact:   Charlesetta Shanks, RN BSN CCM [facility]  [phone] business mobile phone Toll free office [phone]  Fax number: [phone] [name]

## 2020-10-03 NOTE — TOC Progression Note (Signed)
Transition of Care [facility]) - Progression Note    Patient Details  Name: Mckenzie [name] MRN: [id number] Date of Birth: 02-07-1936  [facility] CM/SW Contact  Epifanio Lesches, RN Phone Number: 10/03/2020, 11:11 AM  Clinical Narrative:    [facility] IR extended IR bed , bed will be available on tomorrow, 4/22. NCM made pt/husband aware and pt accepted. Updated COVID needed. [facility] requesting they receive pt in the am of 4/22. TOC team monitoring and will continue to assist with needs.....   Expected Discharge Plan: IP Rehab Facility Barriers to Discharge: Other (comment) (IR bed @ [facility] will be available, 4/22)  Expected Discharge Plan and Services Expected Discharge Plan: IP Rehab Facility In-house Referral: Clinical Social Work     Living arrangements for the past 2 months: Single Family Home Expected Discharge Date: 10/03/20                                     Social Determinants of Health (SDOH) Interventions    Readmission Risk Interventions No flowsheet data found.

## 2020-10-03 NOTE — Progress Notes (Signed)
Patient arrived from 5N via bed. Patient appears alert and oriented and denies pain. Assigned to room 5C04. [facility]

## 2020-10-03 NOTE — Progress Notes (Signed)
Inpatient Rehabilitation Medication Review by a Pharmacist  A complete drug regimen review was completed for this patient to identify any potential clinically significant medication issues.  Clinically significant medication issues were identified:  Yes  Type of Medication Issue Identified Description of Issue Urgent (address now) Non-Urgent (address on AM team rounds) Plan Plan Accepted by Provider? (Yes / No / Pending AM Rounds)  Drug Interaction(s) (clinically significant)       Duplicate Therapy       Allergy       No Medication Administration End Date       Incorrect Dose  Consider reducing dose of Lovenox to 30 mg/day (85 yr old pt, 46.7 kg) Non urgent Pending review by CIR team on AM rounds Pending AM rounds  Additional Drug Therapy Needed       Other  Discharge summary from [facility] listed tretinoin cream; pt did not receive at [facility] or on admission to CIR  CIR provider admission note mentioned adding iron supplement and protein supplement, but these have not been ordered yet Non urgent Pending review by CIR team on AM rounds Pending AM rounds    Name of provider notified for urgent issues identified:  N/A  For non-urgent medication issues to be resolved on team rounds tomorrow morning a [facility] Secure Chat Handoff was sent to:  Delle Reining, PA  Time spent performing this drug regimen review (minutes):  20  Vicki Mallet, PharmD, BCPS, [facility] Clinical Pharmacist 10/03/2020 4:32 PM

## 2020-10-03 NOTE — Discharge Summary (Signed)
Physician Discharge Summary  Mckenzie [name]:[id number] DOB: 06-29-1935 DOA: 09/27/2020  PCP: Royann Shivers, PA-C  Admit date: 09/27/2020 Discharge date: 10/03/2020  Admitted From: Home Disposition:  Home  Discharge Condition:Stable CODE STATUS:FULL Diet recommendation: Heart Healthy  Brief/Interim Summary: Patient is 85 year old female with history of coronary artery disease status post stenting, COPD, GERD, postsurgical hypothyroidism, mild aortic stenosis, IBS who presented to the emergency department with complaint of left rib pain after having a mechanical fall.  No history of loss of consciousness or head trauma or history of seizures.  In the emergency department hip x-ray showed angulated and overriding intertrochanteric fracture of femur.  [facility].  Underwent ORIF.  PT/OT recommending CIR on discharge.  Patient is medically stable for discharge .  Following problems were addressed during her hospitalization:  Left hip intertrochanteric/subtrochanteric femur fracture: Secondary to mechanical fall.  Status post left hip intramedullary nailing on 4/15.  Continue pain management, DVT prophylaxis.  Follow-up with orthopedics as an outpatient. PT/OT recommended CIR on discharge.  Postoperative blood loss/normocytic anemia: Status posttransfusion with 2 units of PRBC on 4/16.  Hemoglobin currently stable in the range of 7-8.  Leukocytosis: Resolved  Hypertension: Currently blood pressure stable.  Home blood pressure meds  restarted  History of coronary artery disease: Currently stable.  On Lipitor, aspirin, metoprolol  COPD: Currently not in exacerbation.  On room air  Chronic bladder outlet obstruction: Foley catheter.  Follows with [facility].  History of postsurgical hypothyroidism: On levothyroxine 75 mcg at home.  Low level of TSH,with high T4.  Changed the dosing of  Levothyroxine to 25 mcg.  Cannot stop  levothyroxine entirely due to postsurgical hypothyroidism. It is very important to check thyroid function test in 6 -8 weeks.    Discharge Diagnoses:  Principal Problem:   Lt Hip Hip fracture -s/p Mechanical Fall Active Problems:   GERD   Mild Aortic valve sclerosis   CAD (coronary artery disease)/Stent to LAD, 2003 [facility]   Hypothyroidism, postsurgical   Irritable bowel syndrome (IBS)   COPD (chronic obstructive pulmonary disease) (HCC)   HTN (hypertension)    Discharge Instructions  Discharge Instructions    Diet - low sodium heart healthy   Complete by: As directed    Discharge instructions   Complete by: As directed    1)Please take prescribed medications as instructed 2)Do a thyroid function test in 6 to 8 weeks 3)Follow up with orthopedics in 2 weeks.  Name and number of the provider has been attached.   Increase activity slowly   Complete by: As directed    No wound care   Complete by: As directed      Allergies as of 10/03/2020      Reactions   Tetracycline Nausea Only      Medication List    STOP taking these medications   HYDROcodone-acetaminophen 10-325 MG tablet Commonly known as: NORCO     TAKE these medications   aspirin 81 MG chewable tablet Chew 1 tablet (81 mg total) by mouth 2 (two) times daily.   atorvastatin 10 MG tablet Commonly known as: LIPITOR Take 10 mg by mouth daily.   benazepril 5 MG tablet Commonly known as: LOTENSIN Take 5 mg by mouth daily.   CALCIUM 600 PO Take 1,200 mg by mouth daily.   conjugated estrogens vaginal cream Commonly known as: PREMARIN Place 1 Applicatorful vaginally daily.   diphenhydrAMINE 25 mg capsule Commonly known as: BENADRYL  Take 25 mg by mouth daily as needed for sleep.   esomeprazole 40 MG capsule Commonly known as: NEXIUM Take 40 mg by mouth daily before breakfast.   hyoscyamine 0.125 MG SL tablet Commonly known as: LEVSIN SL Take 0.125 mg by mouth every 6 (six) hours as needed for  cramping or diarrhea or loose stools.   levothyroxine 25 MCG tablet Commonly known as: SYNTHROID Take 1 tablet (25 mcg total) by mouth daily at 6 (six) AM. Start taking on: October 04, 2020 What changed:   medication strength  how much to take  when to take this   Melatonin 10 MG Tabs Take 1 tablet by mouth at bedtime.   metoprolol tartrate 25 MG tablet Commonly known as: LOPRESSOR TAKE (1) TABLET TWICE DAILY. What changed: See the new instructions.   Movantik 25 MG Tabs tablet Generic drug: naloxegol oxalate Take 25 mg by mouth daily.   multivitamin tablet Take 1 tablet by mouth daily.   nitroGLYCERIN 0.4 MG SL tablet Commonly known as: NITROSTAT Place one tablet under tongue every 5 minutes up to 3 doses as needed for chest pain. No more than 3 doses over a 15 minute period.   Oxycodone HCl 10 MG Tabs Take 1 tablet (10 mg total) by mouth every 6 (six) hours as needed for moderate pain or severe pain.   polyethylene glycol 17 g packet Commonly known as: MIRALAX / GLYCOLAX Take 17 g by mouth every morning.   raloxifene 60 MG tablet Commonly known as: EVISTA Take 60 mg by mouth daily.   tamsulosin 0.4 MG Caps capsule Commonly known as: FLOMAX Take 0.4 mg by mouth daily.   temazepam 30 MG capsule Commonly known as: RESTORIL Take 30 mg by mouth at bedtime.   traZODone 50 MG tablet Commonly known as: DESYREL Take 50 mg by mouth at bedtime as needed for sleep.   tretinoin 0.05 % cream Commonly known as: RETIN-A Apply 1 application topically at bedtime.   Vitamin D3 25 MCG (1000 UT) Caps Take 1,000 Units by mouth daily. Take one tab daily       Follow-up Information    August Saucer, Corrie Mckusick, MD. Schedule an appointment as soon as possible for a visit in 2 week(s).   Specialty: Orthopedic Surgery Contact information: [address] [place] [address] [phone]              Allergies  Allergen Reactions  . Tetracycline Nausea Only     Consultations:  Orthopedics   Procedures/Studies: Pelvis Portable  Result Date: 09/27/2020 CLINICAL DATA:  Post left hip fracture EXAM: PORTABLE PELVIS 1-2 VIEWS COMPARISON:  01/27/2021 FINDINGS: Internal fixation within the left femur with intramedullary nail and dynamic hip screw. Anatomic alignment. No hardware complicating feature. Mild degenerative changes in the hips bilaterally. IMPRESSION: Internal fixation across the left proximal femoral fracture. No complicating feature. Electronically Signed   By: Charlett Nose M.D.   On: 09/27/2020 19:48   DG Chest Port 1 View  Result Date: 09/27/2020 CLINICAL DATA:  Larey Seat last night, unable to walk, RIGHT hip fracture; history coronary artery disease post MI, COPD, GERD, hypertension EXAM: PORTABLE CHEST 1 VIEW COMPARISON:  08/07/2027 as and 19 FINDINGS: Intraspinal stimulator lower thoracic spine. Normal heart size, mediastinal contours, and pulmonary vascularity. Atherosclerotic calcification aorta. Lungs appear emphysematous but clear. Elevation of RIGHT diaphragm unchanged. No pleural effusion or pneumothorax. Skin fold projects over upper RIGHT chest. Bones demineralized. IMPRESSION: Emphysematous changes without infiltrate. Aortic Atherosclerosis (ICD10-I70.0) and Emphysema (ICD10-J43.9).  Electronically Signed   By: Ulyses Southward M.D.   On: 09/27/2020 10:08   DG C-Arm 1-60 Min  Result Date: 09/27/2020 CLINICAL DATA:  Intramedullary nail left femur. EXAM: DG C-ARM 1-60 MIN; LEFT FEMUR 2 VIEWS FLUOROSCOPY TIME:  Fluoroscopy Time:  1 minutes and 50 seconds. Radiation Exposure Index (if provided by the fluoroscopic device): 7.61 mGy. Number of Acquired Spot Images: 6 COMPARISON:  September 27, 2020. FINDINGS: Six C-arm fluoroscopic images were obtained intraoperatively and submitted for post operative interpretation. These images demonstrate intramedullary nail and screw fixation of a left femoral neck fracture with improved, near anatomic alignment.  Please see the performing provider's procedural report for further detail. IMPRESSION: Intraoperative fluoroscopy, as detailed above. Electronically Signed   By: Feliberto Harts MD   On: 09/27/2020 18:42   ECHOCARDIOGRAM COMPLETE  Result Date: 09/27/2020    ECHOCARDIOGRAM REPORT   Patient Name:   Mckenzie [name] Date of Exam: 09/27/2020 Medical Rec #:  [id number]         Height:       62.0 in Accession #:    [id number]        Weight:       103.0 lb Date of Birth:  [birth year]         BSA:          1.442 m Patient Age:    85 years          BP:           123/40 mmHg Patient Gender: F                 HR:           103 bpm. [facility] Procedure: 2D Echo, Cardiac Doppler and Color Doppler Indications:    Abnormal ECG R94.31  History:        Patient has no prior history of Echocardiogram examinations.                 CAD; Risk Factors:Hypertension and Dyslipidemia.  Sonographer:    Jeryl Columbia RDCS (AE) Referring Phys: [id number] COURAGE EMOKPAE IMPRESSIONS  1. Left ventricular ejection fraction, by estimation, is 65 to 70%. The left ventricle has normal function. The left ventricle has no regional wall motion abnormalities. There is moderate concentric left ventricular hypertrophy. Left ventricular diastolic parameters are indeterminate.  2. Right ventricular systolic function is normal. The right ventricular size is normal.  3. A small pericardial effusion is present. There is no evidence of cardiac tamponade.  4. The mitral valve is grossly normal. Trivial mitral valve regurgitation. No evidence of mitral stenosis. Moderate to severe mitral annular calcification.  5. The aortic valve is grossly normal. Aortic valve regurgitation is not visualized. Mild aortic valve stenosis.  6. The inferior vena cava is normal in size with greater than 50% respiratory variability, suggesting right atrial pressure of 3 mmHg. Comparison(s): Prior images unable to be directly viewed, comparison made by report only.  Conclusion(s)/Recommendation(s): Otherwise normal echocardiogram, with minor abnormalities described in the report. FINDINGS  Left Ventricle: Left ventricular ejection fraction, by estimation, is 65 to 70%. The left ventricle has normal function. The left ventricle has no regional wall motion abnormalities. The left ventricular internal cavity size was normal in size. There is  moderate concentric left ventricular hypertrophy. Left ventricular diastolic parameters are indeterminate. Right Ventricle: The right ventricular size is normal. Right vetricular wall thickness was not well visualized. Right ventricular systolic function is normal. Left  Atrium: Left atrial size was normal in size. Right Atrium: Right atrial size was normal in size. Pericardium: A small pericardial effusion is present. There is no evidence of cardiac tamponade. Presence of pericardial fat pad. Mitral Valve: The mitral valve is grossly normal. Moderate to severe mitral annular calcification. Trivial mitral valve regurgitation. No evidence of mitral valve stenosis. Tricuspid Valve: The tricuspid valve is normal in structure. Tricuspid valve regurgitation is trivial. No evidence of tricuspid stenosis. Aortic Valve: The aortic valve is grossly normal. Aortic valve regurgitation is not visualized. Mild aortic stenosis is present. Aortic valve mean gradient measures 14.3 mmHg. Aortic valve peak gradient measures 31.9 mmHg. Aortic valve area, by VTI measures 2.40 cm. Pulmonic Valve: The pulmonic valve was not well visualized. Pulmonic valve regurgitation is not visualized. Aorta: The aortic arch was not well visualized, the ascending aorta was not well visualized and the aortic root was not well visualized. Venous: The inferior vena cava is normal in size with greater than 50% respiratory variability, suggesting right atrial pressure of 3 mmHg. IAS/Shunts: The interatrial septum was not well visualized.  LEFT VENTRICLE PLAX 2D LVOT diam:     1.90 cm   Diastology LV SV:         97       LV e' medial:    5.44 cm/s LV SV Index:   67       LV E/e' medial:  14.3 LVOT Area:     2.84 cm LV e' lateral:   3.81 cm/s                         LV E/e' lateral: 20.4  RIGHT VENTRICLE RV S prime:     12.90 cm/s TAPSE (M-mode): 1.3 cm LEFT ATRIUM             Index       RIGHT ATRIUM          Index LA Vol (A2C):   25.7 ml 17.82 ml/m RA Area:     5.68 cm LA Vol (A4C):   19.4 ml 13.45 ml/m RA Volume:   8.92 ml  6.19 ml/m LA Biplane Vol: 22.7 ml 15.74 ml/m  AORTIC VALVE AV Area (Vmax):    1.82 cm AV Area (Vmean):   2.18 cm AV Area (VTI):     2.40 cm AV Vmax:           282.33 cm/s AV Vmean:          164.000 cm/s AV VTI:            0.402 m AV Peak Grad:      31.9 mmHg AV Mean Grad:      14.3 mmHg LVOT Vmax:         181.67 cm/s LVOT Vmean:        126.000 cm/s LVOT VTI:          0.341 m LVOT/AV VTI ratio: 0.85 MITRAL VALVE MV Area (PHT): 2.91 cm     SHUNTS MV Decel Time: 261 msec     Systemic VTI:  0.34 m MV E velocity: 77.60 cm/s   Systemic Diam: 1.90 cm MV A velocity: 129.00 cm/s MV E/A ratio:  0.60 Jodelle Red MD Electronically signed by Jodelle Red MD Signature Date/Time: 09/27/2020/4:18:47 PM    Final    DG Hip Unilat W or Wo Pelvis 2-3 Views Left  Result Date: 09/27/2020 CLINICAL DATA:  Larey Seat last night, LEFT hip pain, unable  to walk, has pain stimulator EXAM: DG HIP (WITH OR WITHOUT PELVIS) 2-3V LEFT COMPARISON:  CT abdomen and pelvis 09/06/2019 FINDINGS: Osseous demineralization. Prior lower lumbar fusion. Hip and SI joint spaces preserved. Displaced, angulated, and overriding intertrochanteric fracture LEFT femur. No dislocation. Pain stimulator projects over upper lateral RIGHT pelvis. Pelvis intact. IMPRESSION: Displaced angulated and overriding intertrochanteric fracture LEFT femur. Prior lumbar fusion. Electronically Signed   By: Ulyses SouthwardMark  Boles M.D.   On: 09/27/2020 10:06   DG FEMUR MIN 2 VIEWS LEFT  Result Date: 09/27/2020 CLINICAL DATA:   Intramedullary nail left femur. EXAM: DG C-ARM 1-60 MIN; LEFT FEMUR 2 VIEWS FLUOROSCOPY TIME:  Fluoroscopy Time:  1 minutes and 50 seconds. Radiation Exposure Index (if provided by the fluoroscopic device): 7.61 mGy. Number of Acquired Spot Images: 6 COMPARISON:  September 27, 2020. FINDINGS: Six C-arm fluoroscopic images were obtained intraoperatively and submitted for post operative interpretation. These images demonstrate intramedullary nail and screw fixation of a left femoral neck fracture with improved, near anatomic alignment. Please see the performing provider's procedural report for further detail. IMPRESSION: Intraoperative fluoroscopy, as detailed above. Electronically Signed   By: Feliberto HartsFrederick S Jones MD   On: 09/27/2020 18:42       Subjective: Patient seen and examined the bedside this morning.  Comfortable.  Denies any complaints.  Medically stable for discharge.  Discharge Exam: Vitals:   10/03/20 0719 10/03/20 0811  BP: (!) 165/64   Pulse: 85 88  Resp: 15 15  Temp: 98.1 F (36.7 C)   SpO2: 100% 98%   Vitals:   10/02/20 2026 10/03/20 0534 10/03/20 0719 10/03/20 0811  BP: (!) 136/49 130/70 (!) 165/64   Pulse: 80 71 85 88  Resp: 18 16 15 15   Temp: 98.7 F (37.1 C) 98.2 F (36.8 C) 98.1 F (36.7 C)   TempSrc: Oral Oral Oral   SpO2: 97% 98% 100% 98%  Weight:      Height:        General: Pt is alert, awake, not in acute distress Cardiovascular: RRR, S1/S2 +, no rubs, no gallops Respiratory: CTA bilaterally, no wheezing, no rhonchi Abdominal: Soft, NT, ND, bowel sounds + Extremities: no edema, no cyanosis,clean surgical wound left hip GU: Foley    The results of significant diagnostics from this hospitalization (including imaging, microbiology, ancillary and laboratory) are listed below for reference.     Microbiology: Recent Results (from the past 240 hour(s))  Resp Panel by RT-PCR (Flu A&B, Covid) Nasopharyngeal Swab     Status: None   Collection Time: 09/27/20  11:26 AM   Specimen: Nasopharyngeal Swab; Nasopharyngeal(NP) swabs in vial transport medium  Result Value Ref Range Status   SARS Coronavirus 2 by RT PCR NEGATIVE NEGATIVE Final    Comment: (NOTE) SARS-CoV-2 target nucleic acids are NOT DETECTED.  The SARS-CoV-2 RNA is generally detectable in upper respiratory specimens during the acute phase of infection. The lowest concentration of SARS-CoV-2 viral copies this assay can detect is 138 copies/mL. A negative result does not preclude SARS-Cov-2 infection and should not be used as the sole basis for treatment or other patient management decisions. A negative result may occur with  improper specimen collection/handling, submission of specimen other than nasopharyngeal swab, presence of viral mutation(s) within the areas targeted by this assay, and inadequate number of viral copies(<138 copies/mL). A negative result must be combined with clinical observations, patient history, and epidemiological information. The expected result is Negative.  Fact Sheet for Patients:  BloggerCourse.comhttps://www.fda.gov/media/152166/download  Fact Sheet for  Healthcare Providers:  SeriousBroker.it  This test is no t yet approved or cleared by the [place] and  has been authorized for detection and/or diagnosis of SARS-CoV-2 by FDA under an Emergency Use Authorization (EUA). This EUA will remain  in effect (meaning this test can be used) for the duration of the COVID-19 declaration under Section 564(b)(1) of the Act, 21 U.S.C.section 360bbb-3(b)(1), unless the authorization is terminated  or revoked sooner.       Influenza A by PCR NEGATIVE NEGATIVE Final   Influenza B by PCR NEGATIVE NEGATIVE Final    Comment: (NOTE) The Xpert Xpress SARS-CoV-2/FLU/RSV plus assay is intended as an aid in the diagnosis of influenza from Nasopharyngeal swab specimens and should not be used as a sole basis for treatment. Nasal washings and aspirates  are unacceptable for Xpert Xpress SARS-CoV-2/FLU/RSV testing.  Fact Sheet for Patients: BloggerCourse.com  Fact Sheet for Healthcare Providers: SeriousBroker.it  This test is not yet approved or cleared by the [place] FDA and has been authorized for detection and/or diagnosis of SARS-CoV-2 by FDA under an Emergency Use Authorization (EUA). This EUA will remain in effect (meaning this test can be used) for the duration of the COVID-19 declaration under Section 564(b)(1) of the Act, 21 U.S.C. section 360bbb-3(b)(1), unless the authorization is terminated or revoked.  Performed at [facility], [address], [place], [place] [address]      Labs: BNP (last 3 results) No results for input(s): BNP in the last 8760 hours. Basic Metabolic Panel: Recent Labs  Lab 09/27/20 0919 09/28/20 0139 09/29/20 0206 09/30/20 0333 09/30/20 0634  NA 140 137 138 138 138  K 3.4* 3.3* 4.0 >7.5* 4.0  CL 105 107 108 108 108  CO2 26 24 26 22 27   GLUCOSE 115* 191* 121* 91 91  BUN 15 14 15 11 10   CREATININE 0.67 0.73 0.66 0.58 0.54  CALCIUM 8.2* 7.5* 7.8* <4.0* 8.0*  MG  --   --  2.0  --   --    Liver Function Tests: Recent Labs  Lab 09/27/20 0919 09/29/20 0206 09/30/20 0634  AST 27 35 35  ALT 19 18 20   ALKPHOS 45 34* 41  BILITOT 0.8 0.8 1.0  PROT 5.1* 4.6* 4.8*  ALBUMIN 3.2* 2.8* 2.7*   No results for input(s): LIPASE, AMYLASE in the last 168 hours. No results for input(s): AMMONIA in the last 168 hours. CBC: Recent Labs  Lab 09/27/20 0919 09/28/20 0139 09/29/20 0206 09/30/20 0333 10/02/20 0400  WBC 13.3* 8.8 9.4 6.6 7.3  NEUTROABS 10.6*  --   --   --  4.6  HGB 10.4* 6.4* 8.5* 7.9* 8.2*  HCT 31.7* 19.6* 24.9* 23.1* 24.7*  MCV 94.3 93.8 89.6 89.9 93.2  PLT 175 126* 102* 96* 149*   Cardiac Enzymes: No results for input(s): CKTOTAL, CKMB, CKMBINDEX, TROPONINI in the last 168 hours. BNP: Invalid input(s):  POCBNP CBG: Recent Labs  Lab 09/28/20 2153 09/28/20 2333 09/29/20 0437 10/01/20 0008  GLUCAP 151* 134* 117* 102*   D-Dimer No results for input(s): DDIMER in the last 72 hours. Hgb A1c No results for input(s): HGBA1C in the last 72 hours. Lipid Profile No results for input(s): CHOL, HDL, LDLCALC, TRIG, CHOLHDL, LDLDIRECT in the last 72 hours. Thyroid function studies No results for input(s): TSH, T4TOTAL, T3FREE, THYROIDAB in the last 72 hours.  Invalid input(s): FREET3 Anemia work up No results for input(s): VITAMINB12, FOLATE, FERRITIN, TIBC, IRON, RETICCTPCT in the last 72 hours. Urinalysis  Component Value Date/Time   COLORURINE AMBER (A) 05/06/2018 1530   APPEARANCEUR CLOUDY (A) 05/06/2018 1530   LABSPEC 1.014 05/06/2018 1530   PHURINE 5.0 05/06/2018 1530   GLUCOSEU NEGATIVE 05/06/2018 1530   HGBUR MODERATE (A) 05/06/2018 1530   BILIRUBINUR NEGATIVE 05/06/2018 1530   KETONESUR NEGATIVE 05/06/2018 1530   PROTEINUR NEGATIVE 05/06/2018 1530   NITRITE NEGATIVE 05/06/2018 1530   LEUKOCYTESUR MODERATE (A) 05/06/2018 1530   Sepsis Labs Invalid input(s): PROCALCITONIN,  WBC,  LACTICIDVEN Microbiology Recent Results (from the past 240 hour(s))  Resp Panel by RT-PCR (Flu A&B, Covid) Nasopharyngeal Swab     Status: None   Collection Time: 09/27/20 11:26 AM   Specimen: Nasopharyngeal Swab; Nasopharyngeal(NP) swabs in vial transport medium  Result Value Ref Range Status   SARS Coronavirus 2 by RT PCR NEGATIVE NEGATIVE Final    Comment: (NOTE) SARS-CoV-2 target nucleic acids are NOT DETECTED.  The SARS-CoV-2 RNA is generally detectable in upper respiratory specimens during the acute phase of infection. The lowest concentration of SARS-CoV-2 viral copies this assay can detect is 138 copies/mL. A negative result does not preclude SARS-Cov-2 infection and should not be used as the sole basis for treatment or other patient management decisions. A negative result may occur  with  improper specimen collection/handling, submission of specimen other than nasopharyngeal swab, presence of viral mutation(s) within the areas targeted by this assay, and inadequate number of viral copies(<138 copies/mL). A negative result must be combined with clinical observations, patient history, and epidemiological information. The expected result is Negative.  Fact Sheet for Patients:  BloggerCourse.com  Fact Sheet for Healthcare Providers:  SeriousBroker.it  This test is no t yet approved or cleared by the [place] FDA and  has been authorized for detection and/or diagnosis of SARS-CoV-2 by FDA under an Emergency Use Authorization (EUA). This EUA will remain  in effect (meaning this test can be used) for the duration of the COVID-19 declaration under Section 564(b)(1) of the Act, 21 U.S.C.section 360bbb-3(b)(1), unless the authorization is terminated  or revoked sooner.       Influenza A by PCR NEGATIVE NEGATIVE Final   Influenza B by PCR NEGATIVE NEGATIVE Final    Comment: (NOTE) The Xpert Xpress SARS-CoV-2/FLU/RSV plus assay is intended as an aid in the diagnosis of influenza from Nasopharyngeal swab specimens and should not be used as a sole basis for treatment. Nasal washings and aspirates are unacceptable for Xpert Xpress SARS-CoV-2/FLU/RSV testing.  Fact Sheet for Patients: BloggerCourse.com  Fact Sheet for Healthcare Providers: SeriousBroker.it  This test is not yet approved or cleared by the [place] FDA and has been authorized for detection and/or diagnosis of SARS-CoV-2 by FDA under an Emergency Use Authorization (EUA). This EUA will remain in effect (meaning this test can be used) for the duration of the COVID-19 declaration under Section 564(b)(1) of the Act, 21 U.S.C. section 360bbb-3(b)(1), unless the authorization is terminated  or revoked.  Performed at [facility], [address], [place], [place] [address]     Please note: You were cared for by a hospitalist during your hospital stay. Once you are discharged, your primary care physician will handle any further medical issues. Please note that NO REFILLS for any discharge medications will be authorized once you are discharged, as it is imperative that you return to your primary care physician (or establish a relationship with a primary care physician if you do not have one) for your post hospital discharge needs so that they can reassess  your need for medications and monitor your lab values.    Time coordinating discharge: 40 minutes  SIGNED:   Burnadette Pop, MD  [facility] 10/03/2020, 10:40 AM Pager [phone]  If 7PM-7AM, please contact night-coverage [email or web address] Password [email or web address]

## 2020-10-04 LAB — CBC WITH DIFFERENTIAL/PLATELET
Abs Immature Granulocytes: 0.03 10*3/uL (ref 0.00–0.07)
Basophils Absolute: 0 10*3/uL (ref 0.0–0.1)
Basophils Relative: 0 %
Eosinophils Absolute: 0.1 10*3/uL (ref 0.0–0.5)
Eosinophils Relative: 1 %
HCT: 29.9 % — ABNORMAL LOW (ref 36.0–46.0)
Hemoglobin: 9.4 g/dL — ABNORMAL LOW (ref 12.0–15.0)
Immature Granulocytes: 0 %
Lymphocytes Relative: 12 %
Lymphs Abs: 1.1 10*3/uL (ref 0.7–4.0)
MCH: 30.3 pg (ref 26.0–34.0)
MCHC: 31.4 g/dL (ref 30.0–36.0)
MCV: 96.5 fL (ref 80.0–100.0)
Monocytes Absolute: 1 10*3/uL (ref 0.1–1.0)
Monocytes Relative: 11 %
Neutro Abs: 6.5 10*3/uL (ref 1.7–7.7)
Neutrophils Relative %: 76 %
Platelets: 198 10*3/uL (ref 150–400)
RBC: 3.1 MIL/uL — ABNORMAL LOW (ref 3.87–5.11)
RDW: 16.6 % — ABNORMAL HIGH (ref 11.5–15.5)
WBC: 8.7 10*3/uL (ref 4.0–10.5)
nRBC: 0 % (ref 0.0–0.2)

## 2020-10-04 LAB — COMPREHENSIVE METABOLIC PANEL
ALT: 50 U/L — ABNORMAL HIGH (ref 0–44)
AST: 56 U/L — ABNORMAL HIGH (ref 15–41)
Albumin: 3.1 g/dL — ABNORMAL LOW (ref 3.5–5.0)
Alkaline Phosphatase: 114 U/L (ref 38–126)
Anion gap: 9 (ref 5–15)
BUN: 15 mg/dL (ref 8–23)
CO2: 26 mmol/L (ref 22–32)
Calcium: 9 mg/dL (ref 8.9–10.3)
Chloride: 104 mmol/L (ref 98–111)
Creatinine, Ser: 0.6 mg/dL (ref 0.44–1.00)
GFR, Estimated: >60 mL/min (ref >60)
Glucose, Bld: 98 mg/dL (ref 70–99)
Potassium: 4.2 mmol/L (ref 3.5–5.1)
Sodium: 139 mmol/L (ref 135–145)
Total Bilirubin: 1.6 mg/dL — ABNORMAL HIGH (ref 0.3–1.2)
Total Protein: 5.7 g/dL — ABNORMAL LOW (ref 6.5–8.1)

## 2020-10-04 LAB — IRON AND TIBC
Iron: 51 ug/dL (ref 28–170)
Saturation Ratios: 22 % (ref 10.4–31.8)
TIBC: 228 ug/dL — ABNORMAL LOW (ref 250–450)
UIBC: 177 ug/dL

## 2020-10-04 LAB — VITAMIN D 25 HYDROXY (VIT D DEFICIENCY, FRACTURES): Vit D, 25-Hydroxy: 42.54 ng/mL (ref 30–100)

## 2020-10-04 MED ORDER — POLYSACCHARIDE IRON COMPLEX 150 MG PO CAPS
150.0000 mg | ORAL_CAPSULE | Freq: Every day | ORAL | Status: DC
  Administered 2020-10-04 – 2020-10-10 (×7): 150 mg via ORAL
  Filled 2020-10-04 (×7): qty 1

## 2020-10-04 MED ORDER — OXYCODONE HCL 5 MG PO TABS
10.0000 mg | ORAL_TABLET | ORAL | Status: DC | PRN
  Administered 2020-10-04 – 2020-10-09 (×9): 10 mg via ORAL
  Filled 2020-10-04 (×11): qty 2

## 2020-10-04 MED ORDER — PROSOURCE PLUS PO LIQD
30.0000 mL | Freq: Two times a day (BID) | ORAL | Status: DC
  Administered 2020-10-04 – 2020-10-10 (×14): 30 mL via ORAL
  Filled 2020-10-04 (×14): qty 30

## 2020-10-04 NOTE — Progress Notes (Signed)
Inpatient Rehabilitation Center Individual Statement of Services  Patient Name:  Mckenzie [name]  Date:  10/04/2020  Welcome to the Inpatient Rehabilitation Center.  Our goal is to provide you with an individualized program based on your diagnosis and situation, designed to meet your specific needs.  With this comprehensive rehabilitation program, you will be expected to participate in at least 3 hours of rehabilitation therapies Monday-Friday, with modified therapy programming on the weekends.  Your rehabilitation program will include the following services:  Physical Therapy (PT), Occupational Therapy (OT), 24 hour per day rehabilitation nursing, Care Coordinator, Rehabilitation Medicine, Nutrition Services and Pharmacy Services  Weekly team conferences will be held on Tuesday to discuss your progress.  Your Inpatient Rehabilitation Care Coordinator will talk with you frequently to get your input and to update you on team discussions.  Team conferences with you and your family in attendance may also be held.  Expected length of stay: 7-10 days  Overall anticipated outcome: Supervision with cues  Depending on your progress and recovery, your program may change. Your Inpatient Rehabilitation Care Coordinator will coordinate services and will keep you informed of any changes. Your Inpatient Rehabilitation Care Coordinator's name and contact numbers are listed  below.  The following services may also be recommended but are not provided by the Inpatient Rehabilitation Center:    Home Health Rehabiltiation Services  Outpatient Rehabilitation Services    Arrangements will be made to provide these services after discharge if needed.  Arrangements include referral to agencies that provide these services.  Your insurance has been verified to be:  Medicare & UHC Your primary doctor is:  Wayland Denis  Pertinent information will be shared with your doctor and your insurance  company.  Inpatient Rehabilitation Care Coordinator:  Dossie Der, [place] [phone] or Luna Glasgow  Information discussed with and copy given to patient by: Lucy Chris, 10/04/2020, 1:34 PM

## 2020-10-04 NOTE — IPOC Note (Signed)
Overall Plan of Care [facility]) Patient Details Name: Mckenzie [name] MRN: [id number] DOB: [birth year]  Admitting Diagnosis: Femur fracture, left [facility])  Hospital Problems: Principal Problem:   Femur fracture, left (HCC)     Functional Problem List: Nursing Bladder,Edema,Endurance,Medication Management,Pain,Safety,Skin Integrity  PT Balance,Endurance,Motor,Pain,Skin Integrity  OT Balance,Endurance,Motor,Pain,Safety,Skin Integrity  SLP    TR         Basic ADL's: OT Grooming,Bathing,Dressing,Toileting     Advanced  ADL's: OT       Transfers: PT Bed Mobility,Bed to Chair,Car,Furniture  OT Toilet,Tub/Shower     Locomotion: PT Ambulation,Wheelchair Mobility,Stairs     Additional Impairments: OT Fuctional Use of Upper Extremity  SLP        TR      Anticipated Outcomes Item Anticipated Outcome  Self Feeding S  Swallowing      Basic self-care  S  Toileting  S   Bathroom Transfers S  Bowel/Bladder  Mod I  Transfers  supervision  Locomotion  supervision  Communication     Cognition     Pain  <3  Safety/Judgment  Mod I and no falls   Therapy Plan: PT Intensity: Minimum of 1-2 x/day ,45 to 90 minutes PT Frequency: 5 out of 7 days PT Duration Estimated Length of Stay: 7-10 days OT Intensity: Minimum of 1-2 x/day, 45 to 90 minutes OT Frequency: 5 out of 7 days OT Duration/Estimated Length of Stay: 7-10     Due to the current state of emergency, patients may not be receiving their 3-hours of Medicare-mandated therapy.   Team Interventions: Nursing Interventions Patient/Family Education,Bowel Management,Pain Management,Medication Management,Skin Care/Wound Management,Discharge Planning,Psychosocial Support  PT interventions Ambulation/gait training,Discharge planning,Functional mobility training,Psychosocial support,Therapeutic Activities,Balance/vestibular training,Disease management/prevention,Neuromuscular re-education,Skin care/wound  Environmental consultant propulsion/positioning,DME/adaptive equipment instruction,Pain management,Splinting/orthotics,UE/LE Strength taining/ROM,Community reintegration,Functional electrical stimulation,Patient/family education,Stair training,UE/LE Coordination activities  OT Interventions Balance/vestibular training,DME/adaptive equipment instruction,Patient/family education,Therapeutic Activities,Wheelchair propulsion/positioning,Therapeutic Exercise,Psychosocial support,Functional electrical stimulation,Community reintegration,Functional mobility training,UE/LE Strength taining/ROM,Self Care/advanced ADL retraining,UE/LE Coordination activities,Skin care/wound managment,Neuromuscular re-education,Discharge planning,Disease mangement/prevention,Pain management,Splinting/orthotics,Visual/perceptual remediation/compensation  SLP Interventions    TR Interventions    SW/CM Interventions Discharge Planning,Psychosocial Support,Patient/Family Education   Barriers to Discharge MD  Medical stability  Nursing Decreased caregiver support,Home environment access/layout,Wound Care,Lack of/limited family support,Weight bearing restrictions,Medication compliance,Behavior    PT Inaccessible home environment,Home environment access/layout,Wound Care,Weight bearing restrictions 1 STE with 0 rails, LLE TDWB status  OT Decreased caregiver support,Weight bearing restrictions    SLP      SW Weight bearing restrictions TDWB difficult for pt   Team Discharge Planning: Destination: PT-Home ,OT- Home , SLP-  Projected Follow-up: PT-Home health PT, OT-  Home health OT, SLP-  Projected Equipment Needs: PT-To be determined, OT- 3 in 1 bedside comode,Tub/shower seat, SLP-  Equipment Details: PT- , OT-  Patient/family involved in discharge planning: PT- Patient,  OT- , SLP-   MD ELOS: 10-12 days modI Medical Rehab Prognosis:  Excellent Assessment: Mckenzie [name] is an 85 year old woman admitted to  CIR with a left femur fracture. Active medical issues include transaminitis and post-operative pain. Medications have been adjusted and labs and vitals are being monitored.     See Team Conference Notes for weekly updates to the plan of care

## 2020-10-04 NOTE — Evaluation (Signed)
Physical Therapy Assessment and Plan  Patient Details  Name: Mckenzie [name] MRN: [id number] Date of Birth: 05-09-1936  PT Diagnosis: Abnormal posture, Abnormality of gait, Difficulty walking, Muscle weakness and Pain in LLE Rehab Potential: Good ELOS: 7-10 days   Today's Date: 10/04/2020 PT Individual Time: 0900-0955 PT Individual Time Calculation (min): 55 min    Hospital Problem: Principal Problem:   Femur fracture, left [facility])   Past Medical History:  Past Medical History:  Diagnosis Date  . Aortic valve sclerosis    Mild, echo, April, 2012  . ARDS (adult respiratory distress syndrome) ([place]) 2003   with tracheostomy-peritonitis  . Arthritis   . Back pain    Need for surgery, Dr. Glenna Fellows  . CAD (coronary artery disease)    Stent to LAD, 2003 [facility] / nuclear scan April, 201 to artifact / persistent shortness of breath / catheterization Oct 21, 2010... widely patent stent to the mid LAD, no significant obstructive disease, should be low risk    for back surgery  . Carotid arterial disease ([place])    Moderate-Dr. Trula Slade following  . Chronic back pain   . COPD (chronic obstructive pulmonary disease) ([place])   . Diastolic dysfunction    Echo, April, 2011   EF 65%  . Dysrhythmia    afib  . Ejection fraction    EF 65%, echo, September 15, 2010  . GERD (gastroesophageal reflux disease)   . Hemolytic anemia (HCC)    Followed by Dr. Janae Sauce  . Hypertension   . Hypothyroidism   . Left ventricular hypertrophy    EF 65%, echo, April, 2011  . MR (mitral regurgitation)     Mild echo, 2012 ///  Mild per Echo 2009,  mild, echo, April, 2011  . Myocardial infarction ([place])   . Palpitations    Holter, September 16, 2010, PACs and PVCs,one 4 beat run of SVT  . Pneumonia 11-2011  . Preoperative clearance    Preop clearance for back surgery  . Shortness of breath    March, 2012  . UTI (urinary tract infection)    Chronic-on Macrodantin   Past Surgical History:  Past Surgical History:   Procedure Laterality Date  . APPENDECTOMY    . BACK SURGERY    . BREAST LUMPECTOMY Left   . CATARACT EXTRACTION W/PHACO Right 12/05/2012   Procedure: CATARACT EXTRACTION PHACO AND INTRAOCULAR LENS PLACEMENT (IOC);  Surgeon: Tonny Branch, MD;  Location: [facility];  Service: Ophthalmology;  Laterality: Right;  CDE:15.30  . CATARACT EXTRACTION W/PHACO Left 12/15/2012   Procedure: CATARACT EXTRACTION PHACO AND INTRAOCULAR LENS PLACEMENT (IOC);  Surgeon: Tonny Branch, MD;  Location: [facility];  Service: Ophthalmology;  Laterality: Left;  CDE:22.56  . CORONARY ANGIOPLASTY WITH STENT PLACEMENT  2003  . INTRAMEDULLARY (IM) NAIL INTERTROCHANTERIC Left 09/27/2020   Procedure: INTRAMEDULLARY (IM) NAIL INTERTROCHANTRIC;  Surgeon: Meredith Pel, MD;  Location: [place];  Service: Orthopedics;  Laterality: Left;  left trochanter  . repair of ruptured lumbar discs  12-2011  . SPINE SURGERY     lumbar disk removal by [facility]. Francesco [facility]ner [facility]  . THYROID SURGERY  08/2009   Benign adenomas  . TONSILLECTOMY    . TRACHEOSTOMY  2003   Following ARDS     Assessment & Plan Clinical Impression: Patient is a 85 y.o. year old female with history of CAD, COPD, chronic UTIs/indewlling foley - on macrodantin, IBS- constipation, chronic LBP w/ left foot drop; who was admitted on 09/27/20 after fall with left  hip IT fracture. She underwent IM fixation of left hip and post op TDWB w/ low dose ASA bid for DVT prophylaxis. She was found to have ABLA with hgb 6.4 pm 04/16 and transfused with 2 units PRBC with improvement in H/H to 8.2/24.7 as well as improvement resolution of hypotension.  Synthroid was decreased to from 75 mcg to 25 mcg due to history of postsurgical hypothyroidism. Therapy has been ongoing and patient limited by TDWB status, balance deficits and weakness. CIR recommended due to functional decline. Husband at bedside.   Patient currently requires min with mobility secondary to muscle weakness, decreased  cardiorespiratoy endurance and decreased standing balance, decreased postural control, decreased balance strategies and difficulty maintaining precautions.  Prior to hospitalization, patient was independent  with mobility and lived with Spouse in a House home.  Home access is 1Stairs to enter.  Patient will benefit from skilled PT intervention to maximize safe functional mobility, minimize fall risk and decrease caregiver burden for planned discharge home with 24 hour supervision.  Anticipate patient will benefit from follow up [place] at discharge.  PT - End of Session Activity Tolerance: Tolerates 30+ min activity with multiple rests Endurance Deficit: Yes Endurance Deficit Description: required rest breaks due to fatigue and pain PT Assessment Rehab Potential (ACUTE/IP ONLY): Good PT Barriers to Discharge: [place] home environment;Home environment access/layout;Wound Care;Weight bearing restrictions PT Barriers to Discharge Comments: 1 STE with 0 rails, LLE TDWB status PT Patient demonstrates impairments in the following area(s): Balance;Endurance;Motor;Pain;Skin Integrity PT Transfers Functional Problem(s): Bed Mobility;Bed to Chair;Car;Furniture PT Locomotion Functional Problem(s): Ambulation;Wheelchair Mobility;Stairs PT Plan PT Intensity: Minimum of 1-2 x/day ,45 to 90 minutes PT Frequency: 5 out of 7 days PT Duration Estimated Length of Stay: 7-10 days PT Treatment/Interventions: Ambulation/gait training;Discharge planning;Functional mobility training;Psychosocial support;Therapeutic Activities;Balance/vestibular training;Disease management/prevention;Neuromuscular re-education;Skin care/wound management;Therapeutic Exercise;Wheelchair propulsion/positioning;DME/adaptive equipment instruction;Pain management;Splinting/orthotics;UE/LE Strength taining/ROM;Community reintegration;Functional electrical stimulation;Patient/family education;Stair training;UE/LE Coordination activities PT  Transfers Anticipated Outcome(s): supervision PT Locomotion Anticipated Outcome(s): supervision PT Recommendation Follow Up Recommendations: Home health PT Patient destination: Home Equipment Recommended: To be determined  PT Evaluation Precautions/Restrictions Precautions Precautions: Fall Restrictions Weight Bearing Restrictions: Yes LLE Weight Bearing: Touchdown weight bearing Home Living/Prior Functioning Home Living Available Help at Discharge: Family;Available 24 hours/day Type of Home: House Home Access: Stairs to enter [employer] of Steps: 1 Entrance Stairs-Rails: None Home Layout: One level Bathroom Shower/Tub: Multimedia programmer: Standard Bathroom Accessibility: Yes  Lives With: Spouse Prior Function Level of Independence: Independent with basic ADLs;Independent with transfers;Independent with homemaking with ambulation;Independent with gait  Able to Take Stairs?: Yes Driving: Yes Cognition Overall Cognitive Status: Within Functional Limits for tasks assessed Arousal/Alertness: Awake/alert Orientation Level: Oriented X4 Memory: Appears intact Awareness: Appears intact Problem Solving: Impaired Safety/Judgment: Appears intact Sensation Sensation Light Touch: Appears Intact Proprioception: Appears Intact Coordination Gross Motor Movements are Fluid and Coordinated: No Fine Motor Movements are Fluid and Coordinated: Yes Coordination and Movement Description: grossly uncoordinated due to LLE TDWB precautions, decreased balance/postural control, generalized weakness, and pain Finger Nose Finger Test: [facility] bilaterally Heel Shin Test: WFL on RLE; decreased ROM on LLE Motor  Motor Motor: Abnormal postural alignment and control Motor - Skilled Clinical Observations: grossly uncoordinated due to LLE TDWB precautions, decreased balance/postural control, generalized weakness, and pain  Trunk/Postural Assessment  Cervical Assessment Cervical  Assessment: Exceptions to [facility] (forward head) Thoracic Assessment Thoracic Assessment: Exceptions to [facility] (rounded shoulders) Lumbar Assessment Lumbar Assessment: Exceptions to [facility] (posterior pelvic tilt) Postural Control Postural Control: Deficits on evaluation  Balance Balance Balance Assessed: Yes Static Sitting Balance Static Sitting - Balance Support: Feet supported;Bilateral upper extremity supported Static Sitting - Level of Assistance: 5: Stand by assistance (supervision) Dynamic Sitting Balance Dynamic Sitting - Balance Support: Feet supported;No upper extremity supported Dynamic Sitting - Level of Assistance: 5: Stand by assistance (supervision) Static Standing Balance Static Standing - Balance Support: Bilateral upper extremity supported (RW) Static Standing - Level of Assistance: 5: Stand by assistance (CGA) Dynamic Standing Balance Dynamic Standing - Balance Support: Bilateral upper extremity supported (RW) Dynamic Standing - Level of Assistance: 4: Min assist Extremity Assessment  RLE Assessment RLE Assessment: Exceptions to [facility] General Strength Comments: grossly generalized to 4-/5 LLE Assessment LLE Assessment: Exceptions to [facility] General Strength Comments: grossly generalized to 3+/5 (except hip flexion 3/5)  Care Tool Care Tool Bed Mobility Roll left and right activity   Roll left and right assist level: Supervision/Verbal cueing    Sit to lying activity        Lying to sitting edge of bed activity   Lying to sitting edge of bed assist level: Supervision/Verbal cueing     Care Tool Transfers Sit to stand transfer   Sit to stand assist level: Minimal Assistance - Patient > 75%    Chair/bed transfer   Chair/bed transfer assist level: Minimal Assistance - Patient > 75%     Psychologist, counselling transfer activity did not occur: Environmental limitations        Care Tool Locomotion Ambulation   Assist level: Minimal Assistance -  Patient > 75% Assistive device: Walker-rolling Max distance: 78f  Walk 10 feet activity   Assist level: Minimal Assistance - Patient > 75% Assistive device: Walker-rolling   Walk 50 feet with 2 turns activity Walk 50 feet with 2 turns activity did not occur: Safety/medical concerns (fatigue, pain, decreased balance/postural control, weakness)      Walk 150 feet activity Walk 150 feet activity did not occur: Safety/medical concerns (fatigue, pain, decreased balance/postural control, weakness)      Walk 10 feet on uneven surfaces activity Walk 10 feet on uneven surfaces activity did not occur: Safety/medical concerns (fatigue, pain, decreased balance/postural control, weakness)      Stairs Stair activity did not occur: Safety/medical concerns (fatigue, pain, decreased balance/postural control, weakness)        Walk up/down 1 step activity Walk up/down 1 step or curb (drop down) activity did not occur: Safety/medical concerns (fatigue, pain, decreased balance/postural control, weakness)     Walk up/down 4 steps activity did not occuR: Safety/medical concerns (fatigue, pain, decreased balance/postural control, weakness)  Walk up/down 4 steps activity      Walk up/down 12 steps activity Walk up/down 12 steps activity did not occur: Safety/medical concerns (fatigue, pain, decreased balance/postural control, weakness)      Pick up small objects from floor Pick up small object from the floor (from standing position) activity did not occur: Safety/medical concerns (pain, decreased balance/postural control, weakness)      Wheelchair Will patient use wheelchair at discharge?: Yes Type of Wheelchair: Manual   Wheelchair assist level: Supervision/Verbal cueing Max wheelchair distance: 1525f Wheel 50 feet with 2 turns activity   Assist Level: Supervision/Verbal cueing  Wheel 150 feet activity   Assist Level: Supervision/Verbal cueing    Refer to Care Plan for Long Term Goals  SHORT  TERM GOAL WEEK 1 PT Short Term Goal 1 (Week 1): STG=LTG due to LOS  Recommendations for  other services: None   Skilled Therapeutic Intervention Evaluation completed (see details above and below) with education on PT POC and goals and individual treatment initiated with focus on dressing, functional mobility/transfers, generalized strengthening, dynamic standing balance/coordination, ambulation, and improved activity tolerance. Received pt supine in bed, pt educated on PT evaluation, CIR policies, and therapy schedule and agreeable. Pt reported pain 5/10 in LLE (premedicated). RN present to administer morning medications. Donned pants in supine with mod A and transferred supine<>sitting EOB with supervision and use of bedrails. Doffed gown and donned clean pull over shirt with supervision. Sit<>stand with RW and CGA and stand<>pivot bed<>WC with RW and min A with cues for LLE TDWB status. Pt performed WC mobility 182f using BUE and supervision. Pt then ambulated 28fwith RW and min A. Pt appeared to be placing ~25% weight through LLE requiring cues to maintain TDWB precautions. Pt transferred WC<>recliner stand<>pivot with RW and min A. Switched out current WC for 16x16 for improved fit/comfort. Concluded session with pt sitting in recliner, needs within reach, and chair pad alarm placed but no green alarm found; NT notified. Safety plan updated.   Mobility Bed Mobility Bed Mobility: Rolling Right;Supine to Sit Rolling Right: Supervision/verbal cueing Supine to Sit: Supervision/Verbal cueing Transfers Transfers: Sit to Stand;Stand to Sit;Stand Pivot Transfers Sit to Stand: Minimal Assistance - Patient > 75% Stand to Sit: Minimal Assistance - Patient > 75% Stand Pivot Transfers: Minimal Assistance - Patient > 75% Stand Pivot Transfer Details: Verbal cues for safe use of DME/AE;Verbal cues for precautions/safety Stand Pivot Transfer Details (indicate cue type and reason): verbal cues to maintain  LLE TDWB precautions Transfer (Assistive device): Rolling walker Locomotion  Gait Ambulation: Yes Gait Assistance: Minimal Assistance - Patient > 75% Gait Distance (Feet): 20 Feet Assistive device: Rolling walker Gait Assistance Details: Verbal cues for precautions/safety;Verbal cues for safe use of DME/AE Gait Assistance Details: verbal cues for RW safety and to maintain LLE TDWB precautions Gait Gait: Yes Gait Pattern: Impaired Gait Pattern: Step-to pattern;Decreased trunk rotation;Antalgic;Decreased stride length;Poor foot clearance - right Gait velocity: decreased Wheelchair Mobility Wheelchair Mobility: Yes Wheelchair Assistance: SuChartered loss adjusterBoth upper extremities Wheelchair Parts Management: Needs assistance Distance: 15032f Discharge Criteria: Patient will be discharged from PT if patient refuses treatment 3 consecutive times without medical reason, if treatment goals not met, if there is a change in medical status, if patient makes no progress towards goals or if patient is discharged from hospital.  The above assessment, treatment plan, treatment alternatives and goals were discussed and mutually agreed upon: by patient  AnnAlfonse Alpers, DPT  10/04/2020, 12:15 PM

## 2020-10-04 NOTE — Progress Notes (Signed)
PROGRESS NOTE   Subjective/Complaints: Sleepy this morning SBP elevated to 150s Hgb 9.4 BMP stable  ROS: +insomnia   Objective:   No results found. Recent Labs    10/02/20 0400 10/04/20 0636  WBC 7.3 8.7  HGB 8.2* 9.4*  HCT 24.7* 29.9*  PLT 149* 198   Recent Labs    10/04/20 0636  NA 139  K 4.2  CL 104  CO2 26  GLUCOSE 98  BUN 15  CREATININE 0.60  CALCIUM 9.0    Intake/Output Summary (Last 24 hours) at 10/04/2020 1043 Last data filed at 10/04/2020 0831 Gross per 24 hour  Intake 340 ml  Output 600 ml  Net -260 ml        Physical Exam: Vital Signs Blood pressure (!) 152/60, pulse 77, temperature 98.3 F (36.8 C), temperature source Oral, resp. rate 14, height 5\' 2"  (1.575 m), weight 46 kg, SpO2 98 %. Gen: no distress, normal appearing HEENT: oral mucosa pink and moist, NCAT Cardio: Reg rate Chest: normal effort, normal rate of breathing Abd: soft, non-distended Ext: no edema Psych: pleasant, normal affect Skin: intact Genitourinary:    Comments: Foley in place with leg bag Musculoskeletal:     Comments: Extensive ecchymosis from left flank to perineum and down inner thigh. Diffuse ecchymosis right forearm medially. Left hip incisions with dry dressings.   Neurological:     Mental Status: She is alert and oriented to person, place, and time.     Comments: Decreased hearing compensated by hearing aids.  LLE-   DF 4/5    Assessment/Plan: 1. Functional deficits which require 3+ hours per day of interdisciplinary therapy in a comprehensive inpatient rehab setting.  Physiatrist is providing close team supervision and 24 hour management of active medical problems listed below.  Physiatrist and rehab team continue to assess barriers to discharge/monitor patient progress toward functional and medical goals  Care Tool:  Bathing              Bathing assist       Upper Body  Dressing/Undressing Upper body dressing   What is the patient wearing?: Hospital gown only    Upper body assist Assist Level: Set up assist    Lower Body Dressing/Undressing Lower body dressing      What is the patient wearing?: Hospital gown only     Lower body assist Assist for lower body dressing: Minimal Assistance - Patient > 75%     Toileting Toileting    Toileting assist Assist for toileting: Total Assistance - Patient < 25% Assistive Device Comment: foley   Transfers Chair/bed transfer  Transfers assist     Chair/bed transfer assist level: Minimal Assistance - Patient > 75%     Locomotion Ambulation   Ambulation assist      Assist level: Minimal Assistance - Patient > 75% Assistive device: Walker-rolling Max distance: 64ft   Walk 10 feet activity   Assist     Assist level: Minimal Assistance - Patient > 75% Assistive device: Walker-rolling   Walk 50 feet activity   Assist Walk 50 feet with 2 turns activity did not occur: Safety/medical concerns (fatigue, pain, decreased balance/postural control, weakness)  Walk 150 feet activity   Assist Walk 150 feet activity did not occur: Safety/medical concerns (fatigue, pain, decreased balance/postural control, weakness)         Walk 10 feet on uneven surface  activity   Assist Walk 10 feet on uneven surfaces activity did not occur: Safety/medical concerns (fatigue, pain, decreased balance/postural control, weakness)         Wheelchair     Assist Will patient use wheelchair at discharge?: Yes Type of Wheelchair: Manual    Wheelchair assist level: Supervision/Verbal cueing Max wheelchair distance: 138ft    Wheelchair 50 feet with 2 turns activity    Assist        Assist Level: Supervision/Verbal cueing   Wheelchair 150 feet activity     Assist      Assist Level: Supervision/Verbal cueing   Blood pressure (!) 152/60, pulse 77, temperature 98.3 F  (36.8 C), temperature source Oral, resp. rate 14, height 5\' 2"  (1.575 m), weight 46 kg, SpO2 98 %.    Medical Problem List and Plan: 1. Left femur fracture             -patient may shower but incision must be covered             -ELOS/Goals: modI 10-12 days             -Admit to CIR 2.  Antithrombotics: -DVT/anticoagulation:  Pharmaceutical: Lovenox  Instead of heparin tid              -antiplatelet therapy: ASA--decrease to daily with Lovenox on board.  3. Chronic LBP/Pain Management: Change to oxycodone from Norco given elevated liver enzymes.   4. Mood: LCSW to follow for evaluation and support.              -antipsychotic agents: N/a 5. Neuropsych: This patient is capable of making decisions on her own behalf. 6. Skin/Wound Care: Routine pressure relief measures.  7. Fluids/Electrolytes/Nutrition: Monitor I/O. Check lytes in am.  8. Left hip fracture s/p fixation 04/15: To be TDWB 9. HTN: Monitor BP tid--trending upwards 10. Acute blood loss anemia: Will check CBC in am             --add iron supplement. Check iron level tomorrow.  11. Malnutrition: Will add protein supplement to help promote wound healing.  12. COPD: Stable on Dulera bid. Encourage IS.  13. CAD s/p stent: Monitor for symptoms with increase in activity.              --on low dose ASA and atorvastatin.  14. HTN: Monitor BP tid--continue Lisinopril and metoprolol.              --titrate as indicated once mobilized.   15. IBS-constipation: Had small BM for first time today             --resume Miralax daily with movantik prn(patient aware to ask) 16. BOO/Urinary retention: Has had a foley since last summer per Dr. 5/15             --resume macrodantin for UTI prophylaxis.  17. 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 Vitamin D  screening: 4/22 level reviewed and normal.  20. Hypoalbuminemia: encouraged high protein foods. Continue protein supplement.  21. Transaminitis: increasing, change Norco to oxycodone.    LOS: 1 days A FACE TO FACE  EVALUATION WAS PERFORMED  Horton Chin 10/04/2020, 10:43 AM

## 2020-10-04 NOTE — Evaluation (Signed)
Occupational Therapy Assessment and Plan  Patient Details  Name: Mckenzie [name] MRN: [id number] Date of Birth: 07-08-1935  OT Diagnosis: abnormal posture, acute pain, muscle weakness (generalized) and pain in joint Rehab Potential:   ELOS: 7-10   Today's Date: 10/04/2020 OT Individual Time: 1300-1400 OT Individual Time Calculation (min): 60 min     Hospital Problem: Principal Problem:   Femur fracture, left ([place])   Past Medical History:  Past Medical History:  Diagnosis Date  . Aortic valve sclerosis    Mild, echo, April, 2012  . ARDS (adult respiratory distress syndrome) ([place]) 2003   with tracheostomy-peritonitis  . Arthritis   . Back pain    Need for surgery, Dr. Glenna Fellows  . CAD (coronary artery disease)    Stent to LAD, 2003 [facility] / nuclear scan April, 201 to artifact / persistent shortness of breath / catheterization Oct 21, 2010... widely patent stent to the mid LAD, no significant obstructive disease, should be low risk    for back surgery  . Carotid arterial disease ([place])    Moderate-Dr. Trula Slade following  . Chronic back pain   . COPD (chronic obstructive pulmonary disease) ([place])   . Diastolic dysfunction    Echo, April, 2011   EF 65%  . Dysrhythmia    afib  . Ejection fraction    EF 65%, echo, September 15, 2010  . GERD (gastroesophageal reflux disease)   . Hemolytic anemia ([facility])    Followed by Dr. Janae Sauce  . Hypertension   . Hypothyroidism   . Left ventricular hypertrophy    EF 65%, echo, April, 2011  . MR (mitral regurgitation)     Mild echo, 2012 ///  Mild per Echo 2009,  mild, echo, April, 2011  . Myocardial infarction ([place])   . Palpitations    Holter, September 16, 2010, PACs and PVCs,one 4 beat run of SVT  . Pneumonia 11-2011  . Preoperative clearance    Preop clearance for back surgery  . Shortness of breath    March, 2012  . UTI (urinary tract infection)    Chronic-on Macrodantin   Past Surgical History:  Past Surgical History:  Procedure Laterality  Date  . APPENDECTOMY    . BACK SURGERY    . BREAST LUMPECTOMY Left   . CATARACT EXTRACTION W/PHACO Right 12/05/2012   Procedure: CATARACT EXTRACTION PHACO AND INTRAOCULAR LENS PLACEMENT (IOC);  Surgeon: Tonny Branch, MD;  Location: [facility];  Service: Ophthalmology;  Laterality: Right;  CDE:15.30  . CATARACT EXTRACTION W/PHACO Left 12/15/2012   Procedure: CATARACT EXTRACTION PHACO AND INTRAOCULAR LENS PLACEMENT (IOC);  Surgeon: Tonny Branch, MD;  Location: [facility];  Service: Ophthalmology;  Laterality: Left;  CDE:22.56  . CORONARY ANGIOPLASTY WITH STENT PLACEMENT  2003  . INTRAMEDULLARY (IM) NAIL INTERTROCHANTERIC Left 09/27/2020   Procedure: INTRAMEDULLARY (IM) NAIL INTERTROCHANTRIC;  Surgeon: Meredith Pel, MD;  Location: [place];  Service: Orthopedics;  Laterality: Left;  left trochanter  . repair of ruptured lumbar discs  12-2011  . SPINE SURGERY     lumbar disk removal by Dr. Francesco Runner at [facility]  . THYROID SURGERY  08/2009   Benign adenomas  . TONSILLECTOMY    . TRACHEOSTOMY  2003   Following ARDS     Assessment & Plan Clinical Impression: The pt is an 85 yo female presenting 4/15 after a fall at home with c/o L hip pain, inability to ambulate. Imaging revealed L hip fx and is now s/p IM nail of L femur on  4/15. PMH includes: CAD s/p LAD stent in 2003, HTN, COPD, GERD, and IBS.  Patient currently requires mod with basic self-care skills secondary to muscle weakness, decreased cardiorespiratoy endurance and decreased standing balance, decreased balance strategies and difficulty maintaining precautions.  Prior to hospitalization, patient could complete BADL/IADL with independent .  Patient will benefit from skilled intervention to decrease level of assist with basic self-care skills and increase independence with basic self-care skills prior to discharge home with care partner.  Anticipate patient will require 24 hour supervision and follow up home health.  OT - End of  Session Endurance Deficit: Yes Endurance Deficit Description: required rest breaks due to fatigue and pain OT Assessment OT Barriers to Discharge: Decreased caregiver support;Weight bearing restrictions OT Patient demonstrates impairments in the following area(s): Balance;Endurance;Motor;Pain;Safety;Skin Integrity OT Basic ADL's Functional Problem(s): Grooming;Bathing;Dressing;Toileting OT Transfers Functional Problem(s): Toilet;Tub/Shower OT Additional Impairment(s): Fuctional Use of Upper Extremity OT Plan OT Intensity: Minimum of 1-2 x/day, 45 to 90 minutes OT Frequency: 5 out of 7 days OT Duration/Estimated Length of Stay: 7-10 OT Treatment/Interventions: Balance/vestibular training;DME/adaptive equipment instruction;Patient/family education;Therapeutic Activities;Wheelchair propulsion/positioning;Therapeutic Exercise;Psychosocial support;Functional electrical stimulation;Community reintegration;Functional mobility training;UE/LE Strength taining/ROM;Self Care/advanced ADL retraining;UE/LE Coordination activities;Skin care/wound managment;Neuromuscular re-education;Discharge planning;Disease mangement/prevention;Pain management;Splinting/orthotics;Visual/perceptual remediation/compensation OT Self Feeding Anticipated Outcome(s): S OT Basic Self-Care Anticipated Outcome(s): S OT Toileting Anticipated Outcome(s): S OT Bathroom Transfers Anticipated Outcome(s): S OT Recommendation Patient destination: Home Follow Up Recommendations: Home health OT Equipment Recommended: 3 in 1 bedside comode;Tub/shower seat   OT Evaluation Precautions/Restrictions    General   Vital Signs  Pain Pain Assessment Pain Scale: 0-10 Pain Score: 5  Pain Type: Surgical pain Pain Location: Hip Pain Orientation: Left 5/10- reposition in bed at end of session Home Living/Prior [place] expects to be discharged to:: Private residence Living Arrangements: Spouse/significant  other Available Help at Discharge: Family,Available 24 hours/day Type of Home: House Home Access: Stairs to enter [employer] of Steps: 1 Entrance Stairs-Rails: None Home Layout: One level Bathroom Shower/Tub: Multimedia programmer: Programmer, systems: Yes  Lives With: Spouse Prior Function Level of Independence: Independent with basic ADLs,Independent with transfers,Independent with homemaking with ambulation,Independent with gait  Able to Take Stairs?: Yes Driving: Yes Vision Baseline Vision/History: Wears glasses Wears Glasses: Reading only Patient Visual Report: No change from baseline Vision Assessment?: No apparent visual deficits Perception  Perception: Within Functional Limits Praxis Praxis: Intact Cognition Overall Cognitive Status: Within Functional Limits for tasks assessed Arousal/Alertness: Awake/alert Orientation Level: Person;Place;Situation Person: Oriented Place: Oriented Situation: Oriented Year: 2022 Month: April Day of Week: Correct Memory: Appears intact Immediate Memory Recall: Sock;Blue;Bed Memory Recall Sock: Without Cue Memory Recall Blue: Without Cue Memory Recall Bed: Without Cue Awareness: Appears intact Problem Solving: Impaired Safety/Judgment: Appears intact Sensation Sensation Light Touch: Appears Intact Proprioception: Appears Intact Coordination Gross Motor Movements are Fluid and Coordinated: No Fine Motor Movements are Fluid and Coordinated: Yes Coordination and Movement Description: grossly uncoordinated due to LLE TDWB precautions, decreased balance/postural control, generalized weakness, and pain Finger Nose Finger Test: [facility] bilaterally Motor  Motor Motor: Abnormal postural alignment and control Motor - Skilled Clinical Observations: grossly uncoordinated due to LLE TDWB precautions, decreased balance/postural control, generalized weakness, and pain  Trunk/Postural Assessment     Balance    Extremity/Trunk Assessment RUE Assessment RUE Assessment: Within Functional Limits General Strength Comments: generalized weakness LUE Assessment LUE Assessment: Within Functional Limits General Strength Comments: generalized weakness  Care Tool Care Tool Self Care Eating   Eating Assist Level: Set  up assist    Oral Care    Oral Care Assist Level: Set up assist    Bathing Bathing activity did not occur: Refused            Upper Body Dressing(including orthotics)   What is the patient wearing?: Pull over shirt   Assist Level: Supervision/Verbal cueing    Lower Body Dressing (excluding footwear)   What is the patient wearing?: Pants;Underwear/pull up Assist for lower body dressing: Maximal Assistance - Patient 25 - 49%    Putting on/Taking off footwear   What is the patient wearing?: Non-skid slipper socks Assist for footwear: Moderate Assistance - Patient 50 - 74%       Care Tool Toileting Toileting activity   Assist for toileting: Moderate Assistance - Patient 50 - 74%     Care Tool Bed Mobility Roll left and right activity        Sit to lying activity        Lying to sitting edge of bed activity         Care Tool Transfers Sit to stand transfer   Sit to stand assist level: Minimal Assistance - Patient > 75%    Chair/bed transfer   Chair/bed transfer assist level: Minimal Assistance - Patient > 75%     Toilet transfer   Assist Level: Minimal Assistance - Patient > 75%     Care Tool Cognition Expression of Ideas and Wants Expression of Ideas and Wants: Without difficulty (complex and basic) - expresses complex messages without difficulty and with speech that is clear and easy to understand   Understanding Verbal and Non-Verbal Content Understanding Verbal and Non-Verbal Content: Understands (complex and basic) - clear comprehension without cues or repetitions   Memory/Recall Ability *first 3 days only Memory/Recall Ability *first 3 days only:  Current season;Location of own room;Staff names and faces;That he or she is in a hospital/hospital unit    Refer to Care Plan for [place] 1 OT Short Term Goal 1 (Week 1): STG=LTG d/t ELOS  Recommendations for other services: Therapeutic Recreation  Stress management and Outing/community reintegration   Skilled Therapeutic Intervention 1:1. Pt received in recliner with husband present. OT educated on OT role/purpose, CIR, ELOS and POC. Pt provided with TTB for in shower. Pt shower light in bathroom not working therefore elected not safe to take shower in dark. Pt reporting need to toilet. MIN A ambulation into bathroom with RW and reinforcement of TTWB preccautions/pushing through BLE. Pt completes LB dressing with MAX A and toielting with MOD A at [facility] with intermittent cuing needed for adherence to precautions. Pt standing grooming/hand hygiene with [place]. Edu re sock aide and reacher for LB dressing. Exited session with pt seated in bed, exit alarm on and call light in reach   ADL ADL Grooming: Minimal assistance Where Assessed-Grooming: Standing at sink Upper Body Bathing: Setup Where Assessed-Upper Body Bathing: Sitting at sink Lower Body Bathing: Maximal assistance Where Assessed-Lower Body Bathing: Sitting at sink Upper Body Dressing: Setup Lower Body Dressing: Maximal assistance Where Assessed-Lower Body Dressing: Sitting at sink Toileting: Moderate assistance Where Assessed-Toileting: Bedside Commode Mobility    MIN A with RW stand pivot/sit to stand   Discharge Criteria: Patient will be discharged from OT if patient refuses treatment 3 consecutive times without medical reason, if treatment goals not met, if there is a change in medical status, if patient makes no progress towards goals or if patient is discharged from  hospital.  The above assessment, treatment plan, treatment alternatives and goals were discussed and mutually agreed upon: by  patient  Tonny Branch 10/04/2020, 4:57 PM

## 2020-10-04 NOTE — Progress Notes (Signed)
Physical Therapy Session Note  Patient Details  Name: Mckenzie [name] MRN: [id number] Date of Birth: July 27, 1935  Today's Date: 10/04/2020 PT Individual Time: 2993-7169 PT Individual Time Calculation (min): 53 min   Short Term Goals: Week 1:  PT Short Term Goal 1 (Week 1): STG=LTG due to LOS  Skilled Therapeutic Interventions/Progress Updates:   Received pt semi-reclined in bed, pt agreeable to therapy, and reported pain 5/10 in LLE. RN notified and present to administer medications; repositioning and rest breaks done to reduce pain levels. Pt also reported having diarrhea and muscle spasms in LLE; notified MD. Session with focus on functional mobility/transfers, generalized strengthening, dynamic standing balance/coordination, ambulation, simulated car transfers, and improved activity tolerance. Pt demonstrated improvements in adherence to TDWB precautions this afternoon but continues to require min cues. Supine<>sit with supervision and stand<>pivot bed<>WC with RW and min A. Pt performed the following exercises sitting in [facility] with supervision and verbal cues for technique: -L hip flexion 2x15 (decreased ROM) -LAQ 2x15 -hip adduction pillow squeezes 2x10 Pt transported to 56M ortho gym in [facility] total A for time management purposes and performed simulated car transfer with RW and min A but required use of BUEs to lift LLE into car. Pt ambulated 14ft with RW and min A to mat and worked on dynamic standing balance performing LLE toe taps to 3in step 2x12 with BUE support and CGA with emphasis on R SLS and L hip flexion strength. Pt performed the following exercises seated on mat with supervision and verbal/visual cues for technique:  -bicep curls with 7lb dowel 2x12 -overhead chest press with 2lb dowel 2x10 -horizontal chest press at 90 degrees with 2lb dowel 2x10  Worked on dynamic standing balance tossing horseshoes x 4 trials with LUE support and CGA for balance. Pt reported increased pain in LLE  and tingling in L palm (reports tingling is baseline). Stand<>pivot mat<>WC with RW and min A and transported back to room in [facility] total A. Pt requested to return to bed and transferred WC<bed stand<>pivot with RW and min A and sit<>supine with supervision with use of UEs to lift LLE. Concluded session with pt supine in bed, needs within reach, and bed alarm on.   Therapy Documentation Precautions:  Restrictions Weight Bearing Restrictions: Yes LLE Weight Bearing: Touchdown weight bearing  Therapy/Group: Individual Therapy Yazmin Locher PT, DPT   10/04/2020, 7:48 AM

## 2020-10-04 NOTE — Progress Notes (Signed)
Inpatient Rehabilitation Care Coordinator Assessment and Plan Patient Details  Name: Mckenzie [name] MRN: [id number] Date of Birth: [birth year]  Today's Date: 10/04/2020  Hospital Problems: Principal Problem:   Femur fracture, left [facility])  Past Medical History:  Past Medical History:  Diagnosis Date  . Aortic valve sclerosis    Mild, echo, April, 2012  . ARDS (adult respiratory distress syndrome) (HCC) 2003   with tracheostomy-peritonitis  . Arthritis   . Back pain    Need for surgery, Dr. Trey Sailors  . CAD (coronary artery disease)    Stent to LAD, 2003 [employer] / nuclear scan April, 201 to artifact / persistent shortness of breath / catheterization Oct 21, 2010... widely patent stent to the mid LAD, no significant obstructive disease, should be low risk    for back surgery  . Carotid arterial disease (HCC)    Moderate-Dr. Myra Gianotti following  . Chronic back pain   . COPD (chronic obstructive pulmonary disease) (HCC)   . Diastolic dysfunction    Echo, April, 2011   EF 65%  . Dysrhythmia    afib  . Ejection fraction    EF 65%, echo, September 15, 2010  . GERD (gastroesophageal reflux disease)   . Hemolytic anemia (HCC)    Followed by Dr. Derald Macleod  . Hypertension   . Hypothyroidism   . Left ventricular hypertrophy    EF 65%, echo, April, 2011  . MR (mitral regurgitation)     Mild echo, 2012 ///  Mild per Echo 2009,  mild, echo, April, 2011  . Myocardial infarction (HCC)   . Palpitations    Holter, September 16, 2010, PACs and PVCs,one 4 beat run of SVT  . Pneumonia 11-2011  . Preoperative clearance    Preop clearance for back surgery  . Shortness of breath    March, 2012  . UTI (urinary tract infection)    Chronic-on Macrodantin   Past Surgical History:  Past Surgical History:  Procedure Laterality Date  . APPENDECTOMY    . BACK SURGERY    . BREAST LUMPECTOMY Left   . CATARACT EXTRACTION W/PHACO Right 12/05/2012   Procedure: CATARACT EXTRACTION PHACO AND INTRAOCULAR LENS  PLACEMENT (IOC);  Surgeon: Gemma Payor, MD;  Location: [facility];  Service: Ophthalmology;  Laterality: Right;  CDE:15.30  . CATARACT EXTRACTION W/PHACO Left 12/15/2012   Procedure: CATARACT EXTRACTION PHACO AND INTRAOCULAR LENS PLACEMENT (IOC);  Surgeon: Gemma Payor, MD;  Location: [facility];  Service: Ophthalmology;  Laterality: Left;  CDE:22.56  . CORONARY ANGIOPLASTY WITH STENT PLACEMENT  2003  . INTRAMEDULLARY (IM) NAIL INTERTROCHANTERIC Left 09/27/2020   Procedure: INTRAMEDULLARY (IM) NAIL INTERTROCHANTRIC;  Surgeon: Cammy Copa, MD;  Location: [facility] OR;  Service: Orthopedics;  Laterality: Left;  left trochanter  . repair of ruptured lumbar discs  12-2011  . SPINE SURGERY     lumbar disk removal by Dr. Laurian Brim at [facility]  . THYROID SURGERY  08/2009   Benign adenomas  . TONSILLECTOMY    . TRACHEOSTOMY  2003   Following ARDS    Social History:  reports that she quit smoking about 20 years ago. Her smoking use included cigarettes. She has a 20.00 pack-year smoking history. She has never used smokeless tobacco. She reports that she does not drink alcohol and does not use drugs.  Family / Support Systems Marital Status: Married Patient Roles: Spouse,Parent Spouse/Significant Other: Wayne [phone]  [phone] Children: Son Other Supports: Church members Anticipated Caregiver: Wayne Ability/Limitations of Caregiver: husband can assist and has in  the past with her care Caregiver Availability: 24/7 Family Dynamics: Close knit with family, pt likes to be able to do for herself and was doing well prior to her fall. She hopes to get back to her mod/i level  Social History Preferred language: English Religion: Baptist Cultural Background: no issues Education: Automotive engineer educated Read: Yes Write: Yes Employment Status: Retired Marine scientist Issues: No issues Guardian/Conservator: None-according to MD pt is capable of making her own decisions while here. Husband  plans to be here daily to provide support   Abuse/Neglect Abuse/Neglect Assessment Can Be Completed: Yes Physical Abuse: Denies Verbal Abuse: Denies Sexual Abuse: Denies Exploitation of patient/patient's resources: Denies Self-Neglect: Denies  Emotional Status Pt's affect, behavior and adjustment status: Pt has always remained independent and wants to get back to this level. She feels her therapy sessions went well this am. She realizes it will take time to heal and will try to be patient with herself Recent Psychosocial Issues: other health issues managed prior to admission Psychiatric History: No history deferred depression screen due to doing well and coping appropriately. She will be a short length stay. Substance Abuse History: No issues  Patient / Family Perceptions, Expectations & Goals Pt/Family understanding of illness & functional limitations: Pt and husband are able to explain her hip fracture and her limitations-WB issues. She is having some difficulites with TDWB but will try her best while here to maintain it. Both talk with the MD when rounding Premorbid pt/family roles/activities: Wife, Mom, retiree, church member, etc Anticipated changes in roles/activities/participation: resume Pt/family expectations/goals: Pt states: " I want to get back to where I was before this fall."  Husband states: " I will help her and have before."  [employer]: Other (Comment) Premorbid Home Care/DME Agencies: Other (Comment) ([facility]-RN active DME from son-wheelchair, rw, tub seat) Transportation available at discharge: husband  Discharge Planning Living Arrangements: Spouse/significant other Support Systems: Spouse/significant other,Children,Friends/neighbors,Church/faith community Type of Residence: Private residence Insurance Resources: Actuary (specify) Education officer, museum) Financial Resources: Social Security,Family Support Financial Screen Referred:  No Living Expenses: Own Money Management: Patient,Spouse Does the patient have any problems obtaining your medications?: No 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 Pt wants to be mobile with her walker if possible but has difficulties with TDWB issues. May be in a wheelchair at first. Aware team evaluating and setting goals today Care Coordinator Barriers to Discharge: Weight bearing restrictions Care Coordinator Barriers to Discharge Comments: TDWB difficult for pt Care Coordinator Anticipated Follow Up Needs: HH/OP  Clinical Impression Pleasant female who is willing to work hard in therapies to improve, but may be more wheelchair level until can WB more. Is active with [facility] due to bladder issues. Await team's evaluations and target discharge date. Husband can assist at discharge and will be here daily  Alaney Witter, Lemar Livings 10/04/2020, 1:32 PM

## 2020-10-05 DIAGNOSIS — R339 Retention of urine, unspecified: Secondary | ICD-10-CM

## 2020-10-05 DIAGNOSIS — I10 Essential (primary) hypertension: Secondary | ICD-10-CM

## 2020-10-05 DIAGNOSIS — S72002S Fracture of unspecified part of neck of left femur, sequela: Secondary | ICD-10-CM

## 2020-10-05 DIAGNOSIS — D62 Acute posthemorrhagic anemia: Secondary | ICD-10-CM

## 2020-10-05 NOTE — Plan of Care (Signed)
  Problem: Consults Goal: RH GENERAL PATIENT EDUCATION Description: See Patient Education module for education specifics. Outcome: Progressing Goal: Skin Care Protocol Initiated - if Braden Score 18 or less Description: If consults are not indicated, leave blank or document N/A Outcome: Progressing   Problem: RH BOWEL ELIMINATION Goal: RH STG MANAGE BOWEL WITH ASSISTANCE Description: STG Manage Bowel with Mod I Assistance. Outcome: Progressing Goal: RH STG MANAGE BOWEL W/MEDICATION W/ASSISTANCE Description: STG Manage Bowel with Medication with Mod I Assistance. Outcome: Progressing   Problem: RH BLADDER ELIMINATION Goal: RH STG MANAGE BLADDER WITH ASSISTANCE Description: STG Manage Bladder With Mod I Assistance Outcome: Progressing   Problem: RH SKIN INTEGRITY Goal: RH STG MAINTAIN SKIN INTEGRITY WITH ASSISTANCE Description: STG Maintain Skin Integrity With Mod I Assistance. Outcome: Progressing   Problem: RH SAFETY Goal: RH STG ADHERE TO SAFETY PRECAUTIONS W/ASSISTANCE/DEVICE Description: STG Adhere to Safety Precautions With Mod I Assistance/Device. Outcome: Progressing Goal: RH STG DECREASED RISK OF FALL WITH ASSISTANCE Description: STG Decreased Risk of Fall With Mod I Assistance. Outcome: Progressing   Problem: RH PAIN MANAGEMENT Goal: RH STG PAIN MANAGED AT OR BELOW PT'S PAIN GOAL Description: < 3 on a 0-10 pain scale. Outcome: Progressing   Problem: RH KNOWLEDGE DEFICIT GENERAL Goal: RH STG INCREASE KNOWLEDGE OF SELF CARE AFTER HOSPITALIZATION Description: Patient will be able to demonstrate knowledge of medication management, pain management, weight bearing precautions with educational handouts and materials provided by staff, at discharge independently. Outcome: Progressing

## 2020-10-05 NOTE — Progress Notes (Signed)
PROGRESS NOTE   Subjective/Complaints: Had a good night. Left hip still pretty sore. Able to sleep. Appetite ok  ROS: Patient denies fever, rash, sore throat, blurred vision, nausea, vomiting, diarrhea, cough, shortness of breath or chest pain,  headache, or mood change.    Objective:   No results found. Recent Labs    10/04/20 0636  WBC 8.7  HGB 9.4*  HCT 29.9*  PLT 198   Recent Labs    10/04/20 0636  NA 139  K 4.2  CL 104  CO2 26  GLUCOSE 98  BUN 15  CREATININE 0.60  CALCIUM 9.0    Intake/Output Summary (Last 24 hours) at 10/05/2020 1147 Last data filed at 10/05/2020 0820 Gross per 24 hour  Intake 318 ml  Output 1100 ml  Net -782 ml        Physical Exam: Vital Signs Blood pressure 135/81, pulse 85, temperature 98.4 F (36.9 C), temperature source Oral, resp. rate 14, height 5\' 2"  (1.575 m), weight 46 kg, SpO2 95 %. Constitutional: No distress . Vital signs reviewed. HEENT: EOMI, oral membranes moist Neck: supple Cardiovascular: RRR without murmur. No JVD    Respiratory/Chest: CTA Bilaterally without wheezes or rales. Normal effort    GI/Abdomen: BS +, non-tender, non-distended Ext: no clubbing, cyanosis, or edema Psych: pleasant and cooperative Skin: intact Genitourinary:    Comments: Foley in place with leg bag Musculoskeletal:     Comments: ecchymoses right forearm, left flank. Left hip also with swelling and bruising. Incisions clean, dressed   Neurological:     Mental Status: She is alert and oriented to person, place, and time.     Comments: Decreased hearing compensated by hearing aids.  LLE-   DF 4/5 . RLE 3-4/5 prox to distal. UE 4/5   Assessment/Plan: 1. Functional deficits which require 3+ hours per day of interdisciplinary therapy in a comprehensive inpatient rehab setting.  Physiatrist is providing close team supervision and 24 hour management of active medical problems listed  below.  Physiatrist and rehab team continue to assess barriers to discharge/monitor patient progress toward functional and medical goals  Care Tool:  Bathing  Bathing activity did not occur: Refused           Bathing assist       Upper Body Dressing/Undressing Upper body dressing   What is the patient wearing?: Pull over shirt    Upper body assist Assist Level: Supervision/Verbal cueing    Lower Body Dressing/Undressing Lower body dressing      What is the patient wearing?: Pants,Underwear/pull up     Lower body assist Assist for lower body dressing: Maximal Assistance - Patient 25 - 49%     Toileting Toileting    Toileting assist Assist for toileting: Moderate Assistance - Patient 50 - 74% Assistive Device Comment: foley   Transfers Chair/bed transfer  Transfers assist     Chair/bed transfer assist level: Minimal Assistance - Patient > 75%     Locomotion Ambulation   Ambulation assist   Ambulation activity did not occur: Safety/medical concerns  Assist level: Minimal Assistance - Patient > 75% Assistive device: Walker-rolling Max distance: 26ft   Walk 10 feet activity  Assist     Assist level: Minimal Assistance - Patient > 75% Assistive device: Walker-rolling   Walk 50 feet activity   Assist Walk 50 feet with 2 turns activity did not occur: Safety/medical concerns (fatigue, pain, decreased balance/postural control, weakness)         Walk 150 feet activity   Assist Walk 150 feet activity did not occur: Safety/medical concerns (fatigue, pain, decreased balance/postural control, weakness)         Walk 10 feet on uneven surface  activity   Assist Walk 10 feet on uneven surfaces activity did not occur: Safety/medical concerns (fatigue, pain, decreased balance/postural control, weakness)         Wheelchair     Assist Will patient use wheelchair at discharge?: Yes Type of Wheelchair: Manual    Wheelchair assist  level: Supervision/Verbal cueing Max wheelchair distance: 141ft    Wheelchair 50 feet with 2 turns activity    Assist        Assist Level: Supervision/Verbal cueing   Wheelchair 150 feet activity     Assist      Assist Level: Supervision/Verbal cueing   Blood pressure 135/81, pulse 85, temperature 98.4 F (36.9 C), temperature source Oral, resp. rate 14, height 5\' 2"  (1.575 m), weight 46 kg, SpO2 95 %.    Medical Problem List and Plan: 1. Left femur fracture             -patient may shower but incision must be covered             -ELOS/Goals: modI 10-12 days             -Continue CIR therapies including PT, OT  2.  Antithrombotics: -DVT/anticoagulation:  Pharmaceutical: Lovenox  Instead of heparin tid              -antiplatelet therapy: ASA--decrease to daily with Lovenox on board.  3. Chronic LBP/Pain Management: Changed to oxycodone from Norco given elevated liver enzymes.   4. Mood: LCSW to follow for evaluation and support.              -antipsychotic agents: N/a 5. Neuropsych: This patient is capable of making decisions on her own behalf. 6. Skin/Wound Care: Routine pressure relief measures.  7. Fluids/Electrolytes/Nutrition: Monitor I/O. Check lytes in am.  8. Left hip fracture s/p fixation 04/15: To be TDWB 9. HTN: Monitor BP tid--trending upwards 10. Acute blood loss anemia:              --added iron supplement. Iron studies reviewed.  11. Malnutrition: added protein supplement to help promote wound healing.  12. COPD: Stable on Dulera bid. Encourage IS.  13. CAD s/p stent: Monitor for symptoms with increase in activity.              --on low dose ASA and atorvastatin.  14. HTN: Monitor BP tid--continue Lisinopril and metoprolol.              --4/23 bp controlled   15. IBS-constipation: Had small BM for first time today             --resumed Miralax daily with movantik prn(patient aware to ask)  -had bm 4/22 16. BOO/Urinary retention: Has had a foley  since last summer per Dr. 5/22             --resumed macrodantin for UTI prophylaxis.  17. Chronic insomnia: Continue Restoril--resume home dose trazodone and melatonin (been getting up at 2 am) 18. Post surgical thyroidism: Synthroid decreased  from 75 mcg to 25 mcg-->will need TSH repeated in 4 weeks (5/21) 19. Vitamin D screening: 4/22 level reviewed and normal.  20. Hypoalbuminemia: encouraged high protein foods. Continue protein supplement.  21. Transaminitis: increasing, change Norco to oxycodone.    -recheck LFT's this week  LOS: 2 days A FACE TO FACE EVALUATION WAS PERFORMED  Ranelle Oyster 10/05/2020, 11:47 AM

## 2020-10-05 NOTE — Progress Notes (Signed)
Occupational Therapy Session Note  Patient Details  Name: Mckenzie [name] MRN: [id number] Date of Birth: 1935/08/09  Today's Date: 10/05/2020 OT Individual Time: 3361-2244 OT Individual Time Calculation (min): 69 min   Short Term Goals: Week 1:  OT Short Term Goal 1 (Week 1): STG=LTG d/t ELOS  Skilled Therapeutic Interventions/Progress Updates:    Pt greeted via PT handoff, ADL needs were met and she had no c/o pain. Pt motivated to sit outside during session for some fresh air. She was escorted via w/c to the outdoor patio. Guided pt through UB exercises using the 2# bar x10 reps 2 sets each exercise, heavy use of visual/demonstrational cues due to hard of hearing. Forward and overhead arm circles completed x1 minute intervals x2 sets. Sit<stand x3 completed using RW with CGA, verbal and demonstrational cues for proper Lt LE placement to maintain TDWB status during powers ups and when lowering into sitting. She was then escorted back to her room, ambulating using RW from doorway back to bed with Min A. When she sat down, verbal and demonstrational cues provided for improving TDWB adherence during ambulatory transfers. Teach back technique utilized to assess understanding with pt verbalizing understanding of education. Setup for washing hands with hand sanitizer and then using her hand lotion. Pt returned to semi reclined position with vcs for maintaining TDWB of the Lt LE while repositioning herself in bed. She remained comfortably in bed, left with all needs within reach and bed alarm set. Tx focus placed on UB strengthening, activity tolerance, and adherence to WB precautions during standing/bedlevel activity.   Therapy Documentation Precautions:  Precautions Precautions: Fall Restrictions Weight Bearing Restrictions: Yes LLE Weight Bearing: Touchdown weight bearing Pain: Pain Assessment Pain Scale: 0-10 Pain Score: 5  Pain Type: Surgical pain Pain Location: Hip Pain Orientation:  Left Pain Descriptors / Indicators: Aching Pain Frequency: Intermittent Pain Onset: On-going Patients Stated Pain Goal: 2 Pain Intervention(s): Medication (See eMAR) ADL: ADL Grooming: Minimal assistance Where Assessed-Grooming: Standing at sink Upper Body Bathing: Setup Where Assessed-Upper Body Bathing: Sitting at sink Lower Body Bathing: Maximal assistance Where Assessed-Lower Body Bathing: Sitting at sink Upper Body Dressing: Setup Lower Body Dressing: Maximal assistance Where Assessed-Lower Body Dressing: Sitting at sink Toileting: Moderate assistance Where Assessed-Toileting: Bedside Commode      Therapy/Group: Individual Therapy  Harace Mccluney A Kaylenn Civil 10/05/2020, 12:50 PM

## 2020-10-05 NOTE — Plan of Care (Signed)
  Problem: Consults Goal: RH GENERAL PATIENT EDUCATION Description: See Patient Education module for education specifics. Outcome: Progressing   Problem: RH BOWEL ELIMINATION Goal: RH STG MANAGE BOWEL WITH ASSISTANCE Description: STG Manage Bowel with Mod I Assistance. Outcome: Progressing   Problem: RH BLADDER ELIMINATION Goal: RH STG MANAGE BLADDER WITH ASSISTANCE Description: STG Manage Bladder With Mod I Assistance Outcome: Progressing   Problem: RH SKIN INTEGRITY Goal: RH STG MAINTAIN SKIN INTEGRITY WITH ASSISTANCE Description: STG Maintain Skin Integrity With Mod I Assistance. Outcome: Progressing   Problem: RH SAFETY Goal: RH STG ADHERE TO SAFETY PRECAUTIONS W/ASSISTANCE/DEVICE Description: STG Adhere to Safety Precautions With Mod I Assistance/Device. Outcome: Progressing Goal: RH STG DECREASED RISK OF FALL WITH ASSISTANCE Description: STG Decreased Risk of Fall With Mod I Assistance. Outcome: Progressing   Problem: RH PAIN MANAGEMENT Goal: RH STG PAIN MANAGED AT OR BELOW PT'S PAIN GOAL Description: < 3 on a 0-10 pain scale. Outcome: Progressing   Problem: RH KNOWLEDGE DEFICIT GENERAL Goal: RH STG INCREASE KNOWLEDGE OF SELF CARE AFTER HOSPITALIZATION Description: Patient will be able to demonstrate knowledge of medication management, pain management, weight bearing precautions with educational handouts and materials provided by staff, at discharge independently. Outcome: Progressing

## 2020-10-05 NOTE — Progress Notes (Signed)
Physical Therapy Session Note  Patient Details  Name: Mckenzie [name] MRN: [id number] Date of Birth: 11/07/1935  Today's Date: 10/05/2020 PT Individual Time: 5397-6734 and 1937-9024 PT Individual Time Calculation (min): 41 min and 61 min  Short Term Goals: Week 1:  PT Short Term Goal 1 (Week 1): STG=LTG due to LOS  Skilled Therapeutic Interventions/Progress Updates:   Session 1  Pt received supine in bed with HOB elevated. Reported 5/10 pain in L hip similar to yesterday; verbalizes that it is constant and non-fluctuating, but is agreeable to therapy. Transferred supine to sit SPV with HOB elevated. Transferred STS minA with RW. Stand pivot transfer towards L to WC minA with RW. RN entered room to assist with draining foley catheter; RN then left room and therapist assumed pt care.  Wheelchair mobility towards elevator with SPV ~68ft with one turn; wheeled rest of the way to therapy gym for time management. Decreased push stroke length noted during wc propulsion; education provided to take large strokes for UE shoulder health.  Stand pivot minA with RW towards L to therapy mat. STS for RLE strengthening performed 2x7 minA with RW; pt required education regarding TDWB through LLE and for hand placement during exercise (one hand on RW and other hand on mat). VC also required for increased hip ext due to posterior lean.  Gait training 2ft minA with RW; pt demonstrated step through pattern with >TDWB through LLE when fatigued. VC to "pretend there is an eggshell under your L foot" and tactile cuing with therapist foot under LLE to facilitate reduced WB. Education also provided to produce step-to pattern with LLE leading to reduce LLE WB. Gait training re-performed 2x26ft minA RW; pt demonstrated improvement, but defaulted to prior mechanics for final ~46ft and verbalized that this was due to fatigue.  Pt wheeled back to room for time management and left sitting upright as next therapy session was  about to begin. Needs within reach.  Session 2 Pt received supine in bed with HOB elevated and husband in room, who shortly left after therapist arrival. Pt did not report any new pain, but did verbalize fatigue from previous sessions (which was observed throughout session), however was agreeable to therapy. Pt transferred supine to sit SPV with HOB elevated. Transferred STS minA with RW. Stand pivot transfer towards L to WC minA with RW. Wheelchair mobility ~159ft to therapy gym SPV. VC provided again to take larger strokes; pt receptive to this VC.  Gait training 3x82ft min assist with RW; decreased consecutive distance in comparison to morning session due to fatigue. Cuing and facilitation provided similar to morning session for reinforcement. VC to "pretend there is an eggshell under your L foot" and tactile cuing with therapist foot under LLE to facilitate reduced WB. Education and demonstration also provided to produce step-to pattern with LLE leading to reduce LLE WB. Pt able to perform gait and adhere to precautions much better than in morning session.  Standing RLE balance performed with min assist with pt cuing to not use RW for UE support (but available in case it is needed). Pt able to hold position 2x30sec with min-mod difficulty. Challenge of "high-fiving" therapist added while maintaining RLE stance, with high-fives provided in various directions; pt able to perform activity ~30sec without significant LOB, but demonstrated and verbalized mod-max difficulty. Second RLE balance task introduced via 3-way LLE tapping towards external targets; performed min assist with RW. VC to not step on targets, but rather simply tap them. Pt able to  perform with mod-max difficulty.  Standing LE strengthening via LLE abduction with RLE in stance performed min assist with RW. Pt initially demonstrated increased hip flex and ER during exercise, but able to correct with VC to "keep toes straight" and "raise your  leg directly to the side." Pt demonstrated very small range and max difficulty when performing correctly, verbalizing "the side of [her] butt working."  Pt wheeled back to room for time management. STS min assist with RW, and pt transferred L to recliner stand pivot min assist with RW. Seat alarm turned on and needs within reach.  Therapy Documentation Precautions:  Precautions Precautions: Fall Restrictions Weight Bearing Restrictions: Yes LLE Weight Bearing: Touchdown weight bearing   Therapy/Group: Individual Therapy  Paschal Dopp, SPT 10/05/2020, 12:31 PM

## 2020-10-06 NOTE — Progress Notes (Signed)
Physical Therapy Session Note  Patient Details  Name: Mckenzie [name] MRN: [id number] Date of Birth: 1935/07/29  Today's Date: 10/06/2020 PT Individual Time: 1030-1118 PT Individual Time Calculation (min): 48 min   Short Term Goals: Week 1:  PT Short Term Goal 1 (Week 1): STG=LTG due to LOS  Skilled Therapeutic Interventions/Progress Updates:  Pt resting in bed.  She rated pain L hip/thigh 5/10; premedicated.  Therapeutic exercise performed with LEs, trunk and UEs to increase strength for functional mobility. Supine- 10 x 1 cervical flexion for core activation, R/L straight leg raises,  R unilateral bridging, 2 x 10 alternating ankle pumps, R/L shoulder protraction with extended elbow (PT ensuring elbow is straight). L ankle PF stretch, including grade I mobilizations.  Husband arrived.  PT educated him in stretching L foot.  He reported that pt has had L foot "dragging" for about a year.  Pt was unaware that she had L toes dragging, but did state that she felt about 1 year ago that she had had a mild stroke.  She did not visit an MD at that time.  PT educated pt and husband about increased risk of falling due to L limited ROM of ankle.  Supine> sit using without bed rails, flat bed, with supervision.  Sit> stand with CGA to RW.  Gait training with RW on level tile, x 90' with CGA; cues for sequencing and TDWBing LLE.  At end of session, pt resting in recliner with bil LEs elevated.  Seat pad alarm set, needs at hand, and husband present.  PT asked RN to provide ice pack for L hip; she verified that she would do so.     Therapy Documentation Precautions:  Precautions Precautions: Fall Restrictions Weight Bearing Restrictions: No LLE Weight Bearing: Touchdown weight bearing          Therapy/Group: Individual Therapy  Wendall Isabell 10/06/2020, 12:18 PM

## 2020-10-07 LAB — BASIC METABOLIC PANEL
Anion gap: 6 (ref 5–15)
BUN: 19 mg/dL (ref 8–23)
CO2: 29 mmol/L (ref 22–32)
Calcium: 8.7 mg/dL — ABNORMAL LOW (ref 8.9–10.3)
Chloride: 102 mmol/L (ref 98–111)
Creatinine, Ser: 0.63 mg/dL (ref 0.44–1.00)
GFR, Estimated: >60 mL/min (ref >60)
Glucose, Bld: 94 mg/dL (ref 70–99)
Potassium: 4.3 mmol/L (ref 3.5–5.1)
Sodium: 137 mmol/L (ref 135–145)

## 2020-10-07 LAB — CBC
HCT: 29.9 % — ABNORMAL LOW (ref 36.0–46.0)
Hemoglobin: 9.4 g/dL — ABNORMAL LOW (ref 12.0–15.0)
MCH: 30.6 pg (ref 26.0–34.0)
MCHC: 31.4 g/dL (ref 30.0–36.0)
MCV: 97.4 fL (ref 80.0–100.0)
Platelets: 225 10*3/uL (ref 150–400)
RBC: 3.07 MIL/uL — ABNORMAL LOW (ref 3.87–5.11)
RDW: 16.8 % — ABNORMAL HIGH (ref 11.5–15.5)
WBC: 8.8 10*3/uL (ref 4.0–10.5)
nRBC: 0 % (ref 0.0–0.2)

## 2020-10-07 MED ORDER — OXYCODONE HCL 5 MG PO TABS
10.0000 mg | ORAL_TABLET | Freq: Every day | ORAL | Status: DC
  Administered 2020-10-07 – 2020-10-10 (×4): 10 mg via ORAL
  Filled 2020-10-07 (×4): qty 2

## 2020-10-07 NOTE — Progress Notes (Signed)
Patient ID: Mckenzie [name], female   DOB: [birth year], 85 y.o.   MRN: [id number]  Met withy pt and husband who is at bedside to inform may need portable ramp up the one step into home. Gave information to him regarding this. Discussed him coming in for therapy session he is willing to do this. Aware team conference tomorrow.

## 2020-10-07 NOTE — Progress Notes (Signed)
Physical Therapy Session Note  Patient Details  Name: Mckenzie [name] MRN: [id number] Date of Birth: Feb 09, 1936  Today's Date: 10/07/2020 PT Individual Time: 0800-0910 PT Individual Time Calculation (min): 70 min   Short Term Goals: Week 1:  PT Short Term Goal 1 (Week 1): STG=LTG due to LOS  Skilled Therapeutic Interventions/Progress Updates:   Received pt sitting on commode with NT, PT took over with care. Pt agreeable to therapy, and reported pain 5/10 in LLE. RN made aware and present to administer medication during session. Session with focus on toileting, functional mobility/transfers, generalized strengthening, dynamic standing balance/coordination, gait training, curb navigation, and improved activity tolerance. Sit<>stand from commode with CGA and use of grab bar and required total A for peri-care. Stand<>pivot commode<>WC with use of grab bar with CGA while maintaining LLE TDWB precautions. Pt sat in WC and washed hands with supervision. Donned pants sitting in WC with min A and required min A to pull pants over hips in standing. Pt performed WC mobility 133ft using BUE and supervision to elevator and transported remainder of way to 23M therapy gym in [facility] total A. Demonstrated technique for navigating curb using RW to simulate home entry. Pt able to step up onto curb using RW and CGA/min A ascending backwards and descending forwards. However, pt with increased difficulty adhering to LLE TDWB precautions due to UE weakness and balance deficits; discussed temporary ramp for ease and safety and pt in agreement. Pt reported her husband is in good health and is able to assist at D/C but her son is blind and would be of no assist. Pt performed the following seated exercises with supervision and verbal/visual cues for technique: -hip flexion x15 bilaterally (decreased ROM on LLE) -LAQ x15 bilaterally -hip adduction ball squeezes x10 -bicep curls with 5lb dowel 2x10 -horizontal chest press at 90  degrees with 3lb dowel 2x10 Pt ambulated 69ft with RW and CGA with cues to adhere to LLE TDWB precautions. With fatigue, pt placing ~25% weight through L toes. Pt politely declined any further ambulation due to pain and fatigue. Worked on dynamic standing balance performing LLE toe taps to 3 cones x 3 reps. Pt with increased difficulty with L hip flexion requiring mod A for balance and with mild posterior lean. Switched for lower height cups and pt performed additional 2x3 reps with min A using RW. Pt transported back to room in [facility] total A and RN requested pt return to bed for catheter care. Stand<>pivot WC<>bed with RW and CGA. Concluded session with pt sitting EOB, needs within reach, and bed alarm on. Provided pt with fresh ice water.   Therapy Documentation Precautions:  Precautions Precautions: Fall Restrictions Weight Bearing Restrictions: No LLE Weight Bearing: Touchdown weight bearing  Therapy/Group: Individual Therapy Telisa Ohlsen PT, DPT   10/07/2020, 7:13 AM

## 2020-10-07 NOTE — Progress Notes (Signed)
Occupational Therapy Session Note  Patient Details  Name: Mckenzie [name] MRN: [id number] Date of Birth: 28-Jan-1936  Today's Date: 10/07/2020 OT Individual Time: 1030-1130 OT Individual Time Calculation (min): 60 min    Short Term Goals: Week 1:  OT Short Term Goal 1 (Week 1): STG=LTG d/t ELOS  Skilled Therapeutic Interventions/Progress Updates:    Treatment session with focus on functional mobility and transfers in ADL apt.  Pt received upright in bed declining bathing and dressing but agreeable to therapy session.  Pt completed stand pivot transfers and ambulatory transfers with RW with min assist to CGA with cues for hand placement during sit > stand.  Therapist providing cues for TDWB as pt frequently placing too much weight through LLE when ambulating.  Pt completed couch and recliner transfers with RW with min assist and max cues for TDWB.  Therapist educated on walk-in shower transfers stepping over ledge backwards.  Pt completed shower transfer with min assist and cues for technique.  Pt continues to place too much weight through LLE, especially when navigating threshold in to walk-in shower.  Discussed modifications to setup and routine in regards to IADLs, especially mobility in kitchen and meal prep.  Pt returned to room and transferred to recliner CGA to MIN assist with RW.  Pt remained upright in recliner with chair alarm on and all needs in reach.  Therapy Documentation Precautions:  Precautions Precautions: Fall Restrictions Weight Bearing Restrictions: No LLE Weight Bearing: Touchdown weight bearing Pain: Pain Assessment Pain Score: 5    Therapy/Group: Individual Therapy  Rosalio Loud 10/07/2020, 12:30 PM

## 2020-10-07 NOTE — Progress Notes (Signed)
PROGRESS NOTE   Subjective/Complaints: Incisional pain limited therapy session today- discussed scheduling oxycodone at 7:30am and patient agreeable. She has no other complaints Hgb 9.4  ROS: Patient denies fever, rash, sore throat, blurred vision, nausea, vomiting, diarrhea, cough, shortness of breath or chest pain,  headache, or mood change. +incisional pain   Objective:   No results found. Recent Labs    10/07/20 0605  WBC 8.8  HGB 9.4*  HCT 29.9*  PLT 225   Recent Labs    10/07/20 0605  NA 137  K 4.3  CL 102  CO2 29  GLUCOSE 94  BUN 19  CREATININE 0.63  CALCIUM 8.7*    Intake/Output Summary (Last 24 hours) at 10/07/2020 1122 Last data filed at 10/07/2020 0820 Gross per 24 hour  Intake 570 ml  Output 325 ml  Net 245 ml        Physical Exam: Vital Signs Blood pressure 130/73, pulse 72, temperature 98.8 F (37.1 C), temperature source Oral, resp. rate 18, height 5\' 2"  (1.575 m), weight 46 kg, SpO2 93 %. Gen: no distress, normal appearing HEENT: oral mucosa pink and moist, NCAT Cardio: Reg rate Chest: normal effort, normal rate of breathing Abd: soft, non-distended Ext: no edema Psych: pleasant, normal affect Skin: intact Genitourinary:    Comments: Foley in place with leg bag Musculoskeletal:     Comments: ecchymoses right forearm, left flank. Left hip also with swelling and bruising. Incisions clean, dressed   Neurological:     Mental Status: She is alert and oriented to person, place, and time.     Comments: Decreased hearing compensated by hearing aids.  LLE-   DF 4/5 . RLE 3-4/5 prox to distal. UE 4/5   Assessment/Plan: 1. Functional deficits which require 3+ hours per day of interdisciplinary therapy in a comprehensive inpatient rehab setting.  Physiatrist is providing close team supervision and 24 hour management of active medical problems listed below.  Physiatrist and rehab team  continue to assess barriers to discharge/monitor patient progress toward functional and medical goals  Care Tool:  Bathing  Bathing activity did not occur: Refused           Bathing assist       Upper Body Dressing/Undressing Upper body dressing   What is the patient wearing?: Pull over shirt    Upper body assist Assist Level: Independent    Lower Body Dressing/Undressing Lower body dressing      What is the patient wearing?: Pants,Underwear/pull up     Lower body assist Assist for lower body dressing: Maximal Assistance - Patient 25 - 49%     Toileting Toileting    Toileting assist Assist for toileting: Moderate Assistance - Patient 50 - 74% Assistive Device Comment: foley   Transfers Chair/bed transfer  Transfers assist     Chair/bed transfer assist level: Contact Guard/Touching assist     Locomotion Ambulation   Ambulation assist   Ambulation activity did not occur: Safety/medical concerns  Assist level: Contact Guard/Touching assist Assistive device: Walker-rolling Max distance: 68ft   Walk 10 feet activity   Assist     Assist level: Contact Guard/Touching assist Assistive device: Walker-rolling   Walk  50 feet activity   Assist Walk 50 feet with 2 turns activity did not occur: Safety/medical concerns (fatigue, pain, decreased balance/postural control, weakness)         Walk 150 feet activity   Assist Walk 150 feet activity did not occur: Safety/medical concerns (fatigue, pain, decreased balance/postural control, weakness)         Walk 10 feet on uneven surface  activity   Assist Walk 10 feet on uneven surfaces activity did not occur: Safety/medical concerns (fatigue, pain, decreased balance/postural control, weakness)         Wheelchair     Assist Will patient use wheelchair at discharge?: Yes Type of Wheelchair: Manual    Wheelchair assist level: Supervision/Verbal cueing Max wheelchair distance: 156ft     Wheelchair 50 feet with 2 turns activity    Assist        Assist Level: Supervision/Verbal cueing   Wheelchair 150 feet activity     Assist      Assist Level: Supervision/Verbal cueing   Blood pressure 130/73, pulse 72, temperature 98.8 F (37.1 C), temperature source Oral, resp. rate 18, height 5\' 2"  (1.575 m), weight 46 kg, SpO2 93 %.    Medical Problem List and Plan: 1. Left femur fracture             -patient may shower but incision must be covered             -ELOS/Goals: modI 10-12 days             -Continue CIR therapies including PT, OT  2.  Impaired mobility: Continue Lovenox             -antiplatelet therapy: ASA--decrease to daily with Lovenox on board.  3. Chronic LBP/Pain Management: Changed to oxycodone from Norco given elevated liver enzymes.  Changed BMP to CMP next week.  4. Mood: LCSW to follow for evaluation and support.              -antipsychotic agents: N/a 5. Neuropsych: This patient is capable of making decisions on her own behalf. 6. Skin/Wound Care: Routine pressure relief measures.  7. Fluids/Electrolytes/Nutrition: Monitor I/O. Lytes stable. Monitor weekly.  8. Left hip fracture s/p fixation 04/15: To be TDWB 9. HTN: Monitor BP tid--trending upwards 10. Acute blood loss anemia:              --added iron supplement. Iron studies reviewed. Hgb stable at 9.4 11. Malnutrition: added protein supplement to help promote wound healing.  12. COPD: Stable on Dulera bid. Encourage IS.  13. CAD s/p stent: Monitor for symptoms with increase in activity.              --on low dose ASA and atorvastatin.  14. HTN: Monitor BP tid--continue Lisinopril and metoprolol.              --4/23 bp controlled   15. IBS-constipation: Had small BM for first time today             --resumed Miralax daily with movantik prn(patient aware to ask)  -had bm 4/22 16. BOO/Urinary retention: Has had a foley since last summer per Dr. 5/22             --resumed  macrodantin for UTI prophylaxis.  17. 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 Vitamin D screening:  4/22 level reviewed and normal.  20. Hypoalbuminemia: encouraged high protein foods. Continue protein supplement.  21. Transaminitis: increasing, change Norco to oxycodone.    -recheck LFT's next week.   LOS: 4 days A FACE TO FACE EVALUATION WAS PERFORMED  Greenly Rarick P Ercia Crisafulli 10/07/2020, 11:22 AM

## 2020-10-07 NOTE — Progress Notes (Signed)
Physical Therapy Session Note  Patient Details  Name: Mckenzie [name] MRN: [id number] Date of Birth: 1935-08-13  Today's Date: 10/07/2020 PT Individual Time: 1430-1529 PT Individual Time Calculation (min): 59 min   Short Term Goals: Week 1:  PT Short Term Goal 1 (Week 1): STG=LTG due to LOS  Skilled Therapeutic Interventions/Progress Updates: Pt presents sitting in recliner and agreeable to therapy.  Pt requires CGA for sit to stand transfers, although verbal cues for maintaining TDWB LLE.  Pt tends to move too quickly.  Pt amb 12' to w/c w/ min A to CGA and verbal cues for sequencing and WB restrictions.  Pt taken to small gym for time conservation.  Pt performed seated LAQ, calf raises, abd/add, hip flexion (AAROM LLE).  Pt performed standing toetaps to 3" platform LLE only, and hip flexion.  Pt performed standing BITS for reaching outside of BOS maintaining TDWB LLE.  Pt required seated rest breaks between trials.Pt amb multiple trials of 20' w/ CGA and RW.  Pt returned to recliner w/ seat alarm on and all needs in reach.     Therapy Documentation Precautions:  Precautions Precautions: Fall Restrictions Weight Bearing Restrictions: No LLE Weight Bearing: Touchdown weight bearing General:   Vital Signs: Therapy Vitals Temp: 98.1 F (36.7 C) Temp Source: Oral Pulse Rate: 70 Resp: 16 BP: 105/81 Patient Position (if appropriate): Sitting Oxygen Therapy SpO2: 98 % O2 Device: Room Air Pain: 5/10 L thigh/hip. Pain Assessment Pain Scale: 0-10 Pain Score: 5  Pain Type: Surgical pain Pain Location: Hip Pain Orientation: Left    Therapy/Group: Individual Therapy  Lucio Edward 10/07/2020, 4:03 PM

## 2020-10-08 NOTE — Progress Notes (Signed)
Occupational Therapy Session Note  Patient Details  Name: Mckenzie [name] MRN: [id number] Date of Birth: 1936-01-21  Today's Date: 10/08/2020 OT Individual Time: 1101-1205 OT Individual Time Calculation (min): 64 min    Short Term Goals: Week 1:  OT Short Term Goal 1 (Week 1): STG=LTG d/t ELOS  Skilled Therapeutic Interventions/Progress Updates:    Treatment session with focus on self-care retraining and functional mobility.  Pt received upright in recliner reporting already dressed prior to this therapy session and therefore declining bathing/dressing.  Pt demonstrating difficulty with donning L shoe due to "discomfort" in L hip.  Therapist educated pt on AE to increase independence with LB dressing.  Pt reports having reacher and shoe horn, discussed long handled shoe horn if needed.  Engaged in dynamic standing activity incorporating reaching outside BOS and across midline to challenge balance and endurance while also focusing on maintaining TDWB precautions.  Pt reports feeling wet and may have had BM (as had received stool softener earlier today).  Pt ambulated to toilet with RW with mod cues for WB precautions as pt continues to place increased weight through LLE.  Pt doffed pants and noted incontinence pullup to be saturated with urine.  Foley tubing had come disconnected from foley bad.  RN in to assess and then allowed pt to attempt to complete hygiene and reattach foley tubing.  Pt then donned new clothing due to pullup and pants being soiled.  Pt would benefit from use of reacher for LB dressing as again reporting "discomfort" in L hip with reaching for LB dressing.  Pt ambulated back to recliner with RW with mod cues for WB status and CGA when ambulating.  Pt remained upright in recliner with chair alarm on and all needs in reach.  Therapy Documentation Precautions:  Precautions Precautions: Fall Restrictions Weight Bearing Restrictions: No LLE Weight Bearing: Touchdown weight  bearing General:   Vital Signs: Therapy Vitals Temp: 97.9 F (36.6 C) Temp Source: Oral Pulse Rate: 61 Resp: 16 BP: (!) 119/46 Patient Position (if appropriate): Sitting Oxygen Therapy SpO2: 94 % O2 Device: Room Air Pain:  Pt with no c/o pain   Therapy/Group: Individual Therapy  Rosalio Loud 10/08/2020, 2:21 PM

## 2020-10-08 NOTE — Progress Notes (Signed)
Patient ID: Mckenzie [name], female   DOB: 1935-11-05, 85 y.o.   MRN: [id number]  Met with pt to update regarding team conference goals supervision-CGA level. Husband coming for education this afternoon and building a small ramp up their one step for her. She can use her son's walker due to he does not use it. Deciding on tub seat, will need 3 in 1. Trying to figure out [facility] agency since not sure it is [facility]. Will continue to follow and work on discharge needs for 4/29.

## 2020-10-08 NOTE — Progress Notes (Signed)
PROGRESS NOTE   Subjective/Complaints: +incisional site pain She has no other complaints. Has practiced transfers. CG-S with short distance ambulation.  ROS: Patient denies fever, rash, sore throat, blurred vision, nausea, vomiting, diarrhea, cough, shortness of breath or chest pain,  headache, or mood change. +incisional pain   Objective:   No results found. Recent Labs    10/07/20 0605  WBC 8.8  HGB 9.4*  HCT 29.9*  PLT 225   Recent Labs    10/07/20 0605  NA 137  K 4.3  CL 102  CO2 29  GLUCOSE 94  BUN 19  CREATININE 0.63  CALCIUM 8.7*    Intake/Output Summary (Last 24 hours) at 10/08/2020 0920 Last data filed at 10/08/2020 0700 Gross per 24 hour  Intake 420 ml  Output 300 ml  Net 120 ml        Physical Exam: Vital Signs Blood pressure (!) 143/62, pulse 65, temperature (!) 97.5 F (36.4 C), temperature source Oral, resp. rate 16, height 5\' 2"  (1.575 m), weight 46 kg, SpO2 95 %. Gen: no distress, normal appearing HEENT: oral mucosa pink and moist, NCAT Cardio: Reg rate Chest: normal effort, normal rate of breathing Abd: soft, non-distended Ext: no edema Psych: pleasant, normal affect Skin: intact Genitourinary:    Comments: Foley in place with leg bag Musculoskeletal:     Comments: ecchymoses right forearm, left flank. Left hip also with swelling and bruising. Incisions clean, dressed   Neurological:     Mental Status: She is alert and oriented to person, place, and time.     Comments: Decreased hearing compensated by hearing aids.  LLE-   DF 4/5 . RLE 3-4/5 prox to distal. UE 4/5   Assessment/Plan: 1. Functional deficits which require 3+ hours per day of interdisciplinary therapy in a comprehensive inpatient rehab setting.  Physiatrist is providing close team supervision and 24 hour management of active medical problems listed below.  Physiatrist and rehab team continue to assess barriers to  discharge/monitor patient progress toward functional and medical goals  Care Tool:  Bathing  Bathing activity did not occur: Refused           Bathing assist       Upper Body Dressing/Undressing Upper body dressing   What is the patient wearing?: Pull over shirt    Upper body assist Assist Level: Independent    Lower Body Dressing/Undressing Lower body dressing      What is the patient wearing?: Pants,Underwear/pull up     Lower body assist Assist for lower body dressing: Maximal Assistance - Patient 25 - 49%     Toileting Toileting    Toileting assist Assist for toileting: Moderate Assistance - Patient 50 - 74% Assistive Device Comment: foley   Transfers Chair/bed transfer  Transfers assist     Chair/bed transfer assist level: Contact Guard/Touching assist     Locomotion Ambulation   Ambulation assist   Ambulation activity did not occur: Safety/medical concerns  Assist level: Contact Guard/Touching assist Assistive device: Walker-rolling Max distance: 20   Walk 10 feet activity   Assist     Assist level: Contact Guard/Touching assist Assistive device: Walker-rolling   Walk 50 feet activity  Assist Walk 50 feet with 2 turns activity did not occur: Safety/medical concerns (fatigue, pain, decreased balance/postural control, weakness)         Walk 150 feet activity   Assist Walk 150 feet activity did not occur: Safety/medical concerns (fatigue, pain, decreased balance/postural control, weakness)         Walk 10 feet on uneven surface  activity   Assist Walk 10 feet on uneven surfaces activity did not occur: Safety/medical concerns (fatigue, pain, decreased balance/postural control, weakness)         Wheelchair     Assist Will patient use wheelchair at discharge?: Yes Type of Wheelchair: Manual    Wheelchair assist level: Supervision/Verbal cueing Max wheelchair distance: 132ft    Wheelchair 50 feet with 2 turns  activity    Assist        Assist Level: Supervision/Verbal cueing   Wheelchair 150 feet activity     Assist      Assist Level: Supervision/Verbal cueing   Blood pressure (!) 143/62, pulse 65, temperature (!) 97.5 F (36.4 C), temperature source Oral, resp. rate 16, height 5\' 2"  (1.575 m), weight 46 kg, SpO2 95 %.    Medical Problem List and Plan: 1. Left femur fracture             -patient may shower but incision must be covered             -ELOS/Goals: modI 10-12 days             -Continue CIR therapies including PT, OT  2.  Impaired mobility: Continue Lovenox             -antiplatelet therapy: ASA--decrease to daily with Lovenox on board.  3. Chronic LBP/Pain Management: Changed to oxycodone from Norco given elevated liver enzymes.  Continue to monitor liver enzymes outpatient.   4. Mood: LCSW to follow for evaluation and support.              -antipsychotic agents: N/a 5. Neuropsych: This patient is capable of making decisions on her own behalf. 6. Skin/Wound Care: Routine pressure relief measures.  7. Fluids/Electrolytes/Nutrition: Monitor I/O. Lytes stable. Monitor weekly.  8. Left hip fracture s/p fixation 04/15: To be TDWB 9. HTN: Monitor BP tid--trending upwards- continue benzapril.  10. Acute blood loss anemia:              --added iron supplement. Iron studies reviewed. Hgb stable at 9.4- continue to monitor outpatient.  11. Malnutrition: added protein supplement to help promote wound healing.  12. COPD: Stable on Dulera bid. Encourage IS.  13. CAD s/p stent: Monitor for symptoms with increase in activity.              --on low dose ASA and atorvastatin.  14. HTN: Monitor BP tid--continue Lisinopril and metoprolol.              --4/26 bp controlled   15. IBS-constipation: Had small BM for first time today             --resumed Miralax daily with movantik prn(patient aware to ask)  -had bm 4/22 16. BOO/Urinary retention: Has had a foley since last summer  per Dr. 5/22             --resumed macrodantin for UTI prophylaxis.  17. 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 Vitamin D screening: 4/22 level reviewed and normal.  20. Hypoalbuminemia: encouraged high protein foods. Continue protein supplement.  21. Transaminitis: increasing, change Norco to oxycodone.    -recheck LFT's next week.   LOS: 5 days A FACE TO FACE EVALUATION WAS PERFORMED  Clint Bolder P Lakeyshia Tuckerman 10/08/2020, 9:20 AM

## 2020-10-08 NOTE — Patient Care Conference (Signed)
Inpatient RehabilitationTeam Conference and Plan of Care Update Date: 10/08/2020   Time: 9:40 AM    Patient Name: Mckenzie [name]      Medical Record Number: [id number]  Date of Birth: [birth year] Sex: Female         Room/Bed: 5C04C/5C04C-01 Payor Info: Payor: MEDICARE / Plan: MEDICARE PART A AND B / Product Type: *No Product type* /    Admit Date/Time:  10/03/2020  3:57 PM  Primary Diagnosis:  Femur fracture, left [facility])  Hospital Problems: Principal Problem:   Femur fracture, left [facility])    Expected Discharge Date: Expected Discharge Date: 10/11/20  Team Members Present: Physician leading conference: Dr. Sula Soda Care Coodinator Present: Kennyth Arnold, RN, BSN, CRRN;Becky Dupree, LCSW Nurse Present: Kennyth Arnold, RN PT Present: Raechel Chute, PT OT Present: Rosalio Loud, OT PPS Coordinator present : Edson Snowball, PT     Current Status/Progress Goal Weekly Team Focus  Bowel/Bladder             Swallow/Nutrition/ Hydration             ADL's   Min assist LB bathing and dressing, Min A - CGA stand pivot and ambulatory transfers with cues for TDWB precuations  Supervision overall  ADL retraining, WB status during dynamic standing and transfers, activity tolerance, d/c planning   Mobility   bed mobility supervision, transfers with RW CGA, gait 3ft with RW CGA  supervision/mod I, CGA for steps  functional mobility/transfers, generalized strengthening, dynamic standing balance/coordination, ambulation, adherance to WB precautions, endurance   Communication             Safety/Cognition/ Behavioral Observations            Pain             Skin               Discharge Planning:  Home with husband who can provide supervision-CGA level. He is here daily aware may need portable ramp up their one step at DC   Team Discussion: Incisional site pain, insomnia, elevated systolic BP, some redness to the bottom. Has chronic foley, incontinent bowel, 8/10 reported pain  and medicating appropriately. To go home with husband. Patient on target to meet rehab goals: yes, she has difficulty with weight bearing precautions. Supervision for bed mobility, walks 115 ft but again not good at weight bearing precautions. Supervision to mod I goals. Functional transfers requires lots of cues for weight bearing precautions. Has not been seen for self care. Min assist for lower body dressing. Has supervision goals. Husband is to stay this afternoon for therapy. She will need a 3&1 BSC, RW, and recommending HHOT/PT.   *See Care Plan and progress notes for long and short-term goals.   Revisions to Treatment Plan:  Monitoring BP TID.  Teaching Needs: Family education, medication management, pain management, skin/wound care, transfer training, gait training, balance training, endurance training, safety awareness, weight bearing precautions.  Current Barriers to Discharge: Decreased caregiver support, Medical stability, Home enviroment access/layout, Incontinence, Neurogenic bowel and bladder, Wound care, Lack of/limited family support, Weight bearing restrictions, Medication compliance and Behavior  Possible Resolutions to Barriers: Continue current medications, weight bearing precautions education, provide emotional support.     Medical Summary Current Status: redness to bottom, bruising, incisional site pain, elevated systolic BP  Barriers to Discharge: Medical stability  Barriers to Discharge Comments: redness to bottom, bruising, incisional site pain, elevated systolic BP Possible Resolutions to [employer] Focus: continue to monitor bottom, schedule  oxycodone prior to therapy, continue to monitor BP TID and continue medications.   Continued Need for Acute Rehabilitation Level of Care: The patient requires daily medical management by a physician with specialized training in physical medicine and rehabilitation for the following reasons: Direction of a  multidisciplinary physical rehabilitation program to maximize functional independence : Yes Medical management of patient stability for increased activity during participation in an intensive rehabilitation regime.: Yes Analysis of laboratory values and/or radiology reports with any subsequent need for medication adjustment and/or medical intervention. : Yes   I attest that I was present, lead the team conference, and concur with the assessment and plan of the team.   Tennis Must 10/08/2020, 1:58 PM

## 2020-10-08 NOTE — Plan of Care (Signed)
  Problem: RH Stairs Goal: LTG Patient will ambulate up and down stairs w/assist (PT) Description: LTG: Patient will ambulate up and down # of stairs with assistance (PT) Outcome: Not Applicable Flowsheets (Taken 10/08/2020 0741) LTG: Pt will ambulate up/down stairs assist needed:: (D/C) -- Note: D/C   Problem: RH Ambulation Goal: LTG Patient will ambulate in controlled environment (PT) Description: LTG: Patient will ambulate in a controlled environment, # of feet with assistance (PT). Flowsheets (Taken 10/08/2020 0741) LTG: Pt will ambulate in controlled environ  assist needed:: (downgraded due to fatigue, weakness, and decreased endurance) Supervision/Verbal cueing LTG: Ambulation distance in controlled environment: 60ft with LRAD Note: downgraded due to fatigue, weakness, and decreased endurance Goal: LTG Patient will ambulate in home environment (PT) Description: LTG: Patient will ambulate in home environment, # of feet with assistance (PT). Flowsheets (Taken 10/08/2020 0741) LTG: Pt will ambulate in home environ  assist needed:: (downgraded due to fatigue, weakness, and decreased endurance) Supervision/Verbal cueing LTG: Ambulation distance in home environment: 20ft with LRAD Note: downgraded due to fatigue, weakness, and decreased endurance

## 2020-10-08 NOTE — Progress Notes (Signed)
Physical Therapy Session Note  Patient Details  Name: Mckenzie [name] MRN: [id number] Date of Birth: 1935/08/10  Today's Date: 10/08/2020 PT Individual Time: 4628-6381 PT Individual Time Calculation (min): 45 min   Short Term Goals: Week 1:  PT Short Term Goal 1 (Week 1): STG=LTG due to LOS  Skilled Therapeutic Interventions/Progress Updates:    Patient received sitting up in bed, agreeable to PT. She denies pain. Able to come sit edge of bed with supervision and HOB elevated. Patient transferring to wc via stand pivot with RW and CGA. She is unable to maintain her wbng status of TTWB despite being able to recite what her wbng precautions are. PT transporting patient in wc to therapy gym for time management and energy conservation. Transfer to therapy mat with RW and CGA. Supine exercises: heel slides, hip abd/add, SLR, isometric hip extension 2x12. LAQ 2x12, HSC 2x12. PT providing prolong heel cord stretch L LE. Seated 4# dowel chest press, bicep curl 2x12. Patient ambulating 58ft with RW and CGA with consistent cues to maintain wbng. Patient returning to room in wc, transferring to recliner. Alarm on, call light within reach, RN at bedside.   Therapy Documentation Precautions:  Precautions Precautions: Fall Restrictions Weight Bearing Restrictions: No LLE Weight Bearing: Touchdown weight bearing    Therapy/Group: Individual Therapy  Elizebeth Koller, PT, DPT, CBIS  10/08/2020, 7:57 AM

## 2020-10-08 NOTE — Progress Notes (Signed)
Physical Therapy Session Note  Patient Details  Name: Mckenzie [name] MRN: [id number] Date of Birth: [birth year]  Today's Date: 10/08/2020 PT Individual Time: 4401-0272 and 5366-4403 PT Individual Time Calculation (min): 25 min and 38 min  Short Term Goals: Week 1:  PT Short Term Goal 1 (Week 1): STG=LTG due to LOS  Skilled Therapeutic Interventions/Progress Updates:   Treatment Session 1: 4742-5956 25 min Received pt sitting in recliner, pt agreeable to therapy, and denied any pain at rest but reported increased pain to 5/10 in LLE with upright activity. RN notified and present to administer medication. Session with emphasis on functional mobility/transfers, generalized strengthening, dynamic standing balance/coordination, gait training, and improved activity tolerance. Pt performed sit<>stands with RW and CGA throughout session with 1 minor LOB requiring min A to correct. Pt ambulated 168ft x 1 and 63ft x 1 with RW and CGA. However, pt unable to maintain LLE TDWB precautions and placing ~25% weight through L toes despite verbal and visual cues. Pt then performed WC mobility >144ft using BUE and supervision. Educated pt on WC parts management (including donning/doffing legrests). Concluded session with pt sitting in recliner, needs within reach, and chair pad alarm on.   Treatment Session 2: 1400-1438 38 min Received pt sitting in recliner with husband present for family education training. Pt agreeable to therapy and denied any pain at rest but reported increased pain in LLE with standing activities (unrated). Session with emphasis on discharge planning, functional mobility/transfers, generalized strengthening, dynamic standing balance/coordination, simulated car transfers, curb navigation, and improved activity tolerance. Donned R shoe only to assist with maintaining LLE TDWB status. Stand<>pivot recliner<>WC with RW and CGA with continued difficulty maintaining LLE TDWB status. Pt transported to  4W therapy gym in [facility] total A for time management purposes and demonstrated technique for bumping pt up curb to simulate home entry. Husband then practiced bumping pt up/down curb demonstrating good understanding and safety with technique. Pt transported to ortho gym in [facility] total A and performed simulated car transfer with RW and CGA provided by husband. Pt required min cues for hand placement when standing/sitting as pt with posterior LOB onto car seat. In rehab apartment, pt performed ambulatory furniture transfer on/off low sitting recliner with RW and CGA. Pt reported the height of the recliner in rehab apartment is similar to the height of hers but does not rock. Pt transported back to room in [facility] total A and requested to stay sitting in WC. Concluded session with pt sitting in WC, needs within reach, and chair pad alarm on. Husband present at bedside.   Therapy Documentation Precautions:  Precautions Precautions: Fall Restrictions Weight Bearing Restrictions: No LLE Weight Bearing: Touchdown weight bearing  Therapy/Group: Individual Therapy Vadis Slabach PT, DPT   10/08/2020, 7:19 AM

## 2020-10-09 DIAGNOSIS — K581 Irritable bowel syndrome with constipation: Secondary | ICD-10-CM

## 2020-10-09 DIAGNOSIS — D62 Acute posthemorrhagic anemia: Secondary | ICD-10-CM

## 2020-10-09 DIAGNOSIS — R0989 Other specified symptoms and signs involving the circulatory and respiratory systems: Secondary | ICD-10-CM

## 2020-10-09 DIAGNOSIS — G8918 Other acute postprocedural pain: Secondary | ICD-10-CM

## 2020-10-09 DIAGNOSIS — I10 Essential (primary) hypertension: Secondary | ICD-10-CM

## 2020-10-09 DIAGNOSIS — R7401 Elevation of levels of liver transaminase levels: Secondary | ICD-10-CM

## 2020-10-09 DIAGNOSIS — S72002D Fracture of unspecified part of neck of left femur, subsequent encounter for closed fracture with routine healing: Secondary | ICD-10-CM

## 2020-10-09 MED ORDER — OXYCODONE HCL 5 MG PO TABS
ORAL_TABLET | ORAL | Status: AC
  Filled 2020-10-09: qty 2

## 2020-10-09 MED ORDER — KETOROLAC TROMETHAMINE 15 MG/ML IJ SOLN
INTRAMUSCULAR | Status: AC
  Filled 2020-10-09: qty 1

## 2020-10-09 NOTE — Progress Notes (Signed)
Occupational Therapy Discharge Summary  Patient Details  Name: KARIYA LAVERGNE MRN: [id number] Date of Birth: 11-12-1935   Patient has met 50 of 11 long term goals due to improved activity tolerance, improved balance, postural control, ability to compensate for deficits, improved awareness and improved coordination.  Patient to discharge at overall Supervision level.  Patient's care partner is independent to provide the necessary physical assistance at discharge.  Patient's husband has been present and observed OT therapy sessions.  He has demonstrated ability during PT session to physically assist with pt as needed.  Reasons goals not met: NA   Recommendation:  Patient will benefit from ongoing skilled OT services in home health setting to continue to advance functional skills in the area of BADL and Reduce care partner burden.  Equipment: 3 in1 and shower chair  Reasons for discharge: treatment goals met and discharge from hospital  Patient/family agrees with progress made and goals achieved: Yes  OT Discharge Precautions/Restrictions  Precautions Precautions: Fall Precaution Comments: pt admitted for fall, denies other falls in last 6 months Restrictions Weight Bearing Restrictions: Yes LLE Weight Bearing: Touchdown weight bearing Pain   see session note ADL ADL Equipment Provided: Reacher,Sock aid Eating: Independent Where Assessed-Eating: Chair Grooming: Supervision/safety Where Assessed-Grooming: Standing at sink,Sitting at sink Upper Body Bathing: Supervision/safety Where Assessed-Upper Body Bathing: Sitting at sink Lower Body Bathing: Supervision/safety Where Assessed-Lower Body Bathing: Sitting at sink,Standing at sink Upper Body Dressing: Supervision/safety Where Assessed-Upper Body Dressing: Sitting at sink Lower Body Dressing: Supervision/safety Where Assessed-Lower Body Dressing: Sitting at sink Toileting: Supervision/safety Where Assessed-Toileting:  Glass blower/designer: Close supervision Toilet Transfer Method: Arts development officer: Raised toilet seat,Grab bars Social research officer, government: Environmental education officer Method: Nutritional therapist: Civil engineer, contracting with back Vision Baseline Vision/History: Wears glasses Wears Glasses: Reading only Patient Visual Report: No change from baseline Vision Assessment?: No apparent visual deficits Perception  Perception: Within Functional Limits Praxis Praxis: Intact Cognition Overall Cognitive Status: Within Functional Limits for tasks assessed Arousal/Alertness: Awake/alert Orientation Level: Oriented X4 Attention: Sustained Sustained Attention: Appears intact Memory: Appears intact Problem Solving: Appears intact Safety/Judgment: Appears intact Comments: difficulty maintaining LLE TDWB precautions Sensation Sensation Light Touch: Impaired by gross assessment Hot/Cold: Appears Intact Proprioception: Appears Intact Stereognosis: Appears Intact Additional Comments: pt reports decreased sensation along L lateral malleoli and L great toe Coordination Gross Motor Movements are Fluid and Coordinated: No Fine Motor Movements are Fluid and Coordinated: Yes Coordination and Movement Description: uncoordinated due to LLE TDWB precautions, decreased balance/postural control, generalized weakness and decreased endurance Motor  Motor Motor: Abnormal postural alignment and control Motor - Discharge Observations: uncoordinated due to LLE TDWB precautions, decreased balance/postural control, generalized weakness and decreased endurance Mobility  Transfers Sit to Stand: Supervision/Verbal cueing Stand to Sit: Supervision/Verbal cueing  Trunk/Postural Assessment  Cervical Assessment Cervical Assessment: Exceptions to [facility] (forward head) Thoracic Assessment Thoracic Assessment: Exceptions to [facility] (rounded shoulders) Lumbar  Assessment Lumbar Assessment: Exceptions to [facility] (posterior pelvic tilt) Postural Control Postural Control: Deficits on evaluation  Balance Balance Balance Assessed: Yes Static Sitting Balance Static Sitting - Balance Support: Feet supported;Bilateral upper extremity supported Static Sitting - Level of Assistance: 7: Independent Dynamic Sitting Balance Dynamic Sitting - Balance Support: Feet supported;No upper extremity supported Dynamic Sitting - Level of Assistance: 5: Stand by assistance Dynamic Sitting Balance - Compensations: S Dynamic Sitting - Balance Activities: Reaching for weighted objects;Reaching across midline Static Standing Balance Static Standing - Balance Support: Bilateral upper extremity supported  Static Standing - Level of Assistance: 5: Stand by assistance Static Standing - Comment/# of Minutes: S Dynamic Standing Balance Dynamic Standing - Balance Support: Bilateral upper extremity supported Dynamic Standing - Level of Assistance: 5: Stand by assistance Dynamic Standing - Comments: S Extremity/Trunk Assessment RUE Assessment RUE Assessment: Within Functional Limits General Strength Comments: generalized weakness LUE Assessment LUE Assessment: Within Functional Limits General Strength Comments: generalized weakness   Tayona Sarnowski, [facility] 10/09/2020, 4:09 PM

## 2020-10-09 NOTE — Progress Notes (Signed)
Physical Therapy Session Note  Patient Details  Name: Mckenzie [name] MRN: [id number] Date of Birth: [birth year]  Today's Date: 10/09/2020 PT Individual Time: 1415-1455 PT Individual Time Calculation (min): 40 min   Short Term Goals: Week 1:  PT Short Term Goal 1 (Week 1): STG=LTG due to LOS   Skilled Therapeutic Interventions/Progress Updates:  Patient seated upright in recliner on entrance to room. Patient alert and agreeable to PT session. Patient c/o 4/ 10 pain at start of session. Relates increase in pain with WB to LLE that improves with repositioning and rest from WB activities.   Therapeutic Activity: Pt has questions on location of fracture and surgical repair. Information provided on anatomical location of fracture using picture of femur/ hip joint. Educated on need for IM nailing vs hip replacement.   Transfers: Patient performed STS and SPVT transfers from seat to RW and demos good performance with maintenance of RLE ahead of LLE. First rise to stand requires 2 attempts and vc for scooting to edge of seat prior to attempt to rise. Good control noted in descent to sit.   Gait Training:  Time spent to educate on correct performance of TDWB with maintaining LLE ahead of RLE at all times. Patient ambulated 180 feet using RW with supervision. Demonstrated correct WB stepping for first 25 feet and then demos toe touch but steps RLE equal to or past LLE. Provided vc/ tc for correcting WB with pt unable to correct for more than 1-2 steps throughout.   Therapeutic Exercise: Patient performed the following exercises with vc/ tc for proper technique. Supine BLE SLR with AAROM for LLE. Supine heel slides  Supine hip abd/ add with min PT provided resistance.   All performed 1x20 reps bilaterally.   Patient supine in bed at end of session with brakes locked, bed alarm set, and all needs within reach.  Therapy Documentation Precautions:  Precautions Precautions:  Fall Restrictions Weight Bearing Restrictions: No LLE Weight Bearing: Touchdown weight bearing Pain: Pain Assessment Pain Scale: 0-10 Pain Score: 4  Pain Type: Surgical pain Pain Location: Hip Pain Orientation: Left Pain Descriptors / Indicators: Contraction Pain Frequency: Constant Pain Onset: On-going Patients Stated Pain Goal: 2 Pain Intervention(s): Medication (See eMAR)  Therapy/Group: Individual Therapy  Loel Dubonnet PT, DPT 10/09/2020, 6:32 PM

## 2020-10-09 NOTE — Progress Notes (Signed)
Physical Therapy Session Note  Patient Details  Name: Mckenzie [name] MRN: [id number] Date of Birth: Mar 11, 1936  Today's Date: 10/09/2020 PT Individual Time: 4854-6270 and 3500-9381 PT Individual Time Calculation (min): 69 min and 26 min  Short Term Goals: Week 1:  PT Short Term Goal 1 (Week 1): STG=LTG due to LOS  Skilled Therapeutic Interventions/Progress Updates:   Treatment Session 1: 1015-1124 69 min Received pt sitting in recliner, pt agreeable to therapy, and reported pain 4/10 in LLE (premedicated). Repositioning, rest breaks, and distraction done to reduce pain levels. Pt requested to empty catheter bag prior to leaving room. Sit<>stand with RW and CGA and pt required min A for balance when unbuttoning pants due to posterior LOB. Stand<>pivot recliner<>WC with RW and CGA with another posterior LOB requiring min A to correct. Educated pt on importance of slowing down and keeping RW underneath shoulders rather than out in front of her for safety. Session with emphasis on functional mobility/transfers, generalized strengthening, dynamic standing balance/coordination, gait training, NMR, and improved activity tolerance. Pt performed WC mobility 12ft x 1 and 152ft x 1 using BUE and supervision to 67M ortho gym. Pt ambulated 35ft on uneven surfaces (ramp) with RW and CGA with cues to maintain LLE TDWB precautions. Pt transported to dayroom and transferred onto Nustep with CGA with cues to keep RW wheels in contact with floor. Pt performed BUE and RLE strengthening on Nustep at workload 3 for 8 minutes for a total of 453 steps for improved cardiovascular endurance. Pt then ambulated 74ft with RW and close supervision but with continued difficulty maintaining LLE TDWB precautions. Educated pt on energy conservation and importance of being self aware of when she is getting fatigued as pt with tendency to continue walking with increased fatigue and pain resulting in poor adherence to TDWB precautions.  Worked on dynamic standing balance constructing picture with pipes x 2 trials with BUE support on table and CGA for balance with emphasis on maintaining LLE TDWB precautions, problem solving, and spatial awareness. Pt demonstrating good awareness of LLE TDWB precautions during activity but required min/mod cues for spatial awareness, problem solving, and choosing correct pieces for picture. Pt transported back to room in [facility] total A and transferred WC<>recliner with RW and CGA. Concluded session with pt sitting in recliner, needs within reach, and chair pad alarm on. Husband present at bedside.   Treatment Session 2: 1458-1524 26 min Received pt supine in bed, pt agreeable to therapy, and reported pain 5/10 in LLE (premedicated). Session with emphasis on functional mobility/transfers, generalized strengthening, dynamic standing balance/coordination, ambulation, and improved activity tolerance. Pt performed the following exercises supine in bed with supervision and verbal cues for technique: -heel slides x10 bilaterally -SLR x10 bilaterally -hip abduction x10 bilaterally  -hip adduction pillow squeezes x10 -glute squeezes x10 Supine<>sit with supervision and donned R shoe with supervision. Pt transferred sit<>stand with RW and supervision and ambulated 76ft with RW and supervision. Pt with continued difficulty maintaining LLE TDWB status placing ~25% weight through L toes despite verbal cues and ability to recite WB precautions. Pt performed the following exercises standing with BUE support on RW and CGA for balance: -L hip flexion 2x8 -L hip abduction 2x8 -L hamstring curls 2x8 -L hip extension 2x8 Sit<>supine with supervision using UEs to assist LLE into bed. Concluded session with pt supine in bed, needs within reach, and bed alarm on.   Therapy Documentation Precautions:  Precautions Precautions: Fall Restrictions Weight Bearing Restrictions: No LLE Weight Bearing: Touchdown  weight  bearing  Therapy/Group: Individual Therapy Kaliopi Blyden PT, DPT   10/09/2020, 7:25 AM

## 2020-10-09 NOTE — Progress Notes (Signed)
Occupational Therapy Session Note  Patient Details  Name: Mckenzie [name] MRN: [id number] Date of Birth: 1936-02-25  Today's Date: 10/09/2020 OT Individual Time: 0947-0962 OT Individual Time Calculation (min): 28 min    Short Term Goals: Week 1:  OT Short Term Goal 1 (Week 1): STG=LTG d/t ELOS  Skilled Therapeutic Interventions/Progress Updates:    Treatment session with focus on LB dressing and bathroom transfers.  Pt received upright in bed agreeable to therapy session.  Pt completed LB dressing with cues for use of reacher to increase success with threading L pant leg.  Pt able to utilize reacher with good carryover of education when donning pants.  Pt would benefit from continued practice with use of AE with LB dressing (to include sock aid as well).  Pt completed ambulatory transfer to/from toilet with RW with close supervision and cues for TDWB through LLE.  Therapist also directed pt in simulated shower transfer with stepping over shower threshold.  Pt demonstrating carryover of education from previous session with ability to complete with supervision, cues for TDWB.  Pt returned to recliner and left upright with chair alarm on and all needs in reach.  Therapy Documentation Precautions:  Precautions Precautions: Fall Restrictions Weight Bearing Restrictions: No LLE Weight Bearing: Touchdown weight bearing General:   Vital Signs: Therapy Vitals Temp: (!) 97.1 F (36.2 C) Temp Source: Oral Pulse Rate: 76 Resp: 17 BP: 116/60 Patient Position (if appropriate): Lying Oxygen Therapy SpO2: 100 % O2 Device: Room Air Patient Activity (if Appropriate): In bed Pulse Oximetry Type: Intermittent Pain: Pain Assessment Pain Scale: 0-10 Pain Score: 0-No pain   Therapy/Group: Individual Therapy  Haeven Nickle 10/09/2020, 8:30 AM

## 2020-10-09 NOTE — Progress Notes (Signed)
Patient ID: Mckenzie [name], female   DOB: [birth year], 85 y.o.   MRN: [id number] Spoke with husband who has card from home health agency she was active with [facility]-now new name is [place]. Have spoken with liaison-Lisa and given her the referral for pt's home health

## 2020-10-09 NOTE — Progress Notes (Signed)
PROGRESS NOTE   Subjective/Complaints: Patient seen sitting up in her chair this morning.  She states she slept well overnight.  Husband at bedside.  ROS: Denies CP, SOB, N/V/D  Objective:   No results found. Recent Labs    10/07/20 0605  WBC 8.8  HGB 9.4*  HCT 29.9*  PLT 225   Recent Labs    10/07/20 0605  NA 137  K 4.3  CL 102  CO2 29  GLUCOSE 94  BUN 19  CREATININE 0.63  CALCIUM 8.7*    Intake/Output Summary (Last 24 hours) at 10/09/2020 1412 Last data filed at 10/09/2020 1020 Gross per 24 hour  Intake 180 ml  Output 1775 ml  Net -1595 ml        Physical Exam: Vital Signs Blood pressure (!) 103/39, pulse 66, temperature 97.6 F (36.4 C), temperature source Oral, resp. rate 16, height 5\' 2"  (1.575 m), weight 46 kg, SpO2 100 %. Constitutional: No distress . Vital signs reviewed. HENT: Normocephalic.  Atraumatic. Eyes: EOMI. No discharge. Cardiovascular: No JVD.  RRR. Respiratory: Normal effort.  No stridor.  Bilateral clear to auscultation. GI: Non-distended.  BS +. Skin: Warm and dry.   Right forearm ecchymosis Left hip with tenderness Psych: Normal mood.  Somewhat restless Musc: No edema in extremities.  No tenderness in extremities. Neuro: Alert HOH Motor: Bilateral upper extremities: 4+/5 proximal distal Left lower extremity: Hip flexion, knee extension 4/5, ankle dorsiflexion 3 -/5 Right lower extremity: Hip flexion, knee extension, ankle dorsiflexion 4/5  Assessment/Plan: 1. Functional deficits which require 3+ hours per day of interdisciplinary therapy in a comprehensive inpatient rehab setting.  Physiatrist is providing close team supervision and 24 hour management of active medical problems listed below.  Physiatrist and rehab team continue to assess barriers to discharge/monitor patient progress toward functional and medical goals  Care Tool:  Bathing  Bathing activity did not  occur: Refused           Bathing assist       Upper Body Dressing/Undressing Upper body dressing   What is the patient wearing?: Pull over shirt    Upper body assist Assist Level: Independent    Lower Body Dressing/Undressing Lower body dressing      What is the patient wearing?: Pants     Lower body assist Assist for lower body dressing: Contact Guard/Touching assist     Toileting Toileting    Toileting assist Assist for toileting: Minimal Assistance - Patient > 75% Assistive Device Comment: foley   Transfers Chair/bed transfer  Transfers assist     Chair/bed transfer assist level: Contact Guard/Touching assist     Locomotion Ambulation   Ambulation assist   Ambulation activity did not occur: Safety/medical concerns  Assist level: Supervision/Verbal cueing Assistive device: Walker-rolling Max distance: 51ft   Walk 10 feet activity   Assist     Assist level: Supervision/Verbal cueing Assistive device: Walker-rolling   Walk 50 feet activity   Assist Walk 50 feet with 2 turns activity did not occur: Safety/medical concerns (fatigue, pain, decreased balance/postural control, weakness)  Assist level: Supervision/Verbal cueing Assistive device: Walker-rolling    Walk 150 feet activity   Assist Walk 150  feet activity did not occur: Safety/medical concerns (fatigue, pain, decreased balance/postural control, weakness)         Walk 10 feet on uneven surface  activity   Assist Walk 10 feet on uneven surfaces activity did not occur: Safety/medical concerns (fatigue, pain, decreased balance/postural control, weakness)   Assist level: Contact Guard/Touching assist Assistive device: Photographer Will patient use wheelchair at discharge?: Yes Type of Wheelchair: Manual    Wheelchair assist level: Supervision/Verbal cueing Max wheelchair distance: 179ft    Wheelchair 50 feet with 2 turns  activity    Assist        Assist Level: Supervision/Verbal cueing   Wheelchair 150 feet activity     Assist      Assist Level: Supervision/Verbal cueing   Blood pressure (!) 103/39, pulse 66, temperature 97.6 F (36.4 C), temperature source Oral, resp. rate 16, height 5\' 2"  (1.575 m), weight 46 kg, SpO2 100 %.    Medical Problem List and Plan: 1. Left femur fracture  Continue CIR 2.  Impaired mobility: Continue Lovenox             -antiplatelet therapy: ASA--decrease to daily with Lovenox on board.  3. Chronic LBP/Pain Management: Changed to oxycodone from Norco given elevated liver enzymes.      Appears controlled with meds on 4/27 4. Mood: LCSW to follow for evaluation and support.              -antipsychotic agents: N/a 5. Neuropsych: This patient is capable of making decisions on her own behalf. 6. Skin/Wound Care: Routine pressure relief measures.  7. Fluids/Electrolytes/Nutrition: Monitor I/Os. 8. Left hip fracture s/p fixation 04/15: To be TDWB 9. HTN: Monitor BP tid  Ccontinue benzapril.   Continue metoprolol  Labile on 4/27, monitor for trend 10. Acute blood loss anemia:              --added iron supplement. Iron studies reviewed.   Hemoglobin 9.4 on 4/25  Continue to monitor 11. Malnutrition: added protein supplement to help promote wound healing.  12. COPD: Stable on Dulera bid. Encourage IS.  13. CAD s/p stent: Monitor for symptoms with increase in activity.              --on low dose ASA and atorvastatin.  14. IBS-constipation:              --resumed Miralax daily with movantik prn(patient aware to ask)  Stable 16. BOO/Urinary retention: Has had a foley since last summer per Dr. 5/25             --resumed Macrodantin for UTI prophylaxis.  17. 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 Vitamin D screening: 4/22 level  20. Hypoalbuminemia: encouraged high protein foods. Continue protein supplement.  21. Transaminitis: increasing, change Norco to oxycodone.   Labs ordered for tomorrow  LOS: 6 days A FACE TO FACE EVALUATION WAS PERFORMED  Franko Hilliker 5/22 10/09/2020, 2:12 PM

## 2020-10-10 LAB — COMPREHENSIVE METABOLIC PANEL
ALT: 31 U/L (ref 0–44)
AST: 32 U/L (ref 15–41)
Albumin: 3.2 g/dL — ABNORMAL LOW (ref 3.5–5.0)
Alkaline Phosphatase: 113 U/L (ref 38–126)
Anion gap: 10 (ref 5–15)
BUN: 23 mg/dL (ref 8–23)
CO2: 26 mmol/L (ref 22–32)
Calcium: 8.8 mg/dL — ABNORMAL LOW (ref 8.9–10.3)
Chloride: 99 mmol/L (ref 98–111)
Creatinine, Ser: 0.76 mg/dL (ref 0.44–1.00)
GFR, Estimated: >60 mL/min (ref >60)
Glucose, Bld: 169 mg/dL — ABNORMAL HIGH (ref 70–99)
Potassium: 4 mmol/L (ref 3.5–5.1)
Sodium: 135 mmol/L (ref 135–145)
Total Bilirubin: 1 mg/dL (ref 0.3–1.2)
Total Protein: 6 g/dL — ABNORMAL LOW (ref 6.5–8.1)

## 2020-10-10 MED ORDER — HYDROCODONE-ACETAMINOPHEN 10-325 MG PO TABS
1.0000 | ORAL_TABLET | ORAL | Status: DC | PRN
  Administered 2020-10-10: 1 via ORAL
  Filled 2020-10-10: qty 1

## 2020-10-10 MED ORDER — HYDROCODONE-ACETAMINOPHEN 10-325 MG PO TABS
1.0000 | ORAL_TABLET | Freq: Every day | ORAL | Status: DC
  Administered 2020-10-11: 1 via ORAL
  Filled 2020-10-10: qty 1

## 2020-10-10 NOTE — Progress Notes (Signed)
Inpatient Rehabilitation Care Coordinator Discharge Note  The overall goal for the admission was met for:   Discharge location: Yes-HOME WITH HUSBAND AND SON-HUSBAND CAN PROVIDE SUPERVISION LEVEL  Length of Stay: Yes-8 DAYS  Discharge activity level: Yes-SUPERVISION WITH CUES FOR MAINTAINING WB  Home/community participation: Yes  Services provided included: MD, RD, PT, OT, RN, CM, Pharmacy and [facility]: Medicare and Private Insurance: [employer] offered to/list presented to:YES  Follow-up services arranged: Home Health: [facility]-PT,OT,RN, DME: [facility]-3 IN 1 and Patient/Family request agency HH: [facility] WITH, DME: NO PREF  Comments (or additional information):HUSBAND WAS HERE FOR EDUCATION AND HAVE BUILD A RAMP FOR ONE STEP INTO HOME  Patient/Family verbalized understanding of follow-up arrangements: Yes  Individual responsible for coordination of the follow-up plan: [facility] [phone]  Confirmed correct DME delivered: Elease Hashimoto 10/10/2020    Hazelyn Kallen, Gardiner Rhyme

## 2020-10-10 NOTE — Progress Notes (Signed)
Sutures removed from all three surgical sites. Steristrips were applied and covered with mepilex per MD order. Patient tolerated well.

## 2020-10-10 NOTE — Plan of Care (Signed)
  Problem: RH Balance Goal: LTG: Patient will maintain dynamic sitting balance (OT) Description: LTG:  Patient will maintain dynamic sitting balance with assistance during activities of daily living (OT) Outcome: Completed/Met Goal: LTG Patient will maintain dynamic standing with ADLs (OT) Description: LTG:  Patient will maintain dynamic standing balance with assist during activities of daily living (OT)  Outcome: Completed/Met   Problem: RH Grooming Goal: LTG Patient will perform grooming w/assist,cues/equip (OT) Description: LTG: Patient will perform grooming with assist, with/without cues using equipment (OT) Outcome: Completed/Met   Problem: RH Bathing Goal: LTG Patient will bathe all body parts with assist levels (OT) Description: LTG: Patient will bathe all body parts with assist levels (OT) Outcome: Completed/Met   Problem: RH Dressing Goal: LTG Patient will perform upper body dressing (OT) Description: LTG Patient will perform upper body dressing with assist, with/without cues (OT). Outcome: Completed/Met Goal: LTG Patient will perform lower body dressing w/assist (OT) Description: LTG: Patient will perform lower body dressing with assist, with/without cues in positioning using equipment (OT) Outcome: Completed/Met   Problem: RH Toileting Goal: LTG Patient will perform toileting task (3/3 steps) with assistance level (OT) Description: LTG: Patient will perform toileting task (3/3 steps) with assistance level (OT)  Outcome: Completed/Met   Problem: RH Simple Meal Prep Goal: LTG Patient will perform simple meal prep w/assist (OT) Description: LTG: Patient will perform simple meal prep with assistance, with/without cues (OT). Outcome: Completed/Met   Problem: RH Laundry Goal: LTG Patient will perform laundry w/assist, cues (OT) Description: LTG: Patient will perform laundry with assistance, with/without cues (OT). Outcome: Completed/Met   Problem: RH Toilet  Transfers Goal: LTG Patient will perform toilet transfers w/assist (OT) Description: LTG: Patient will perform toilet transfers with assist, with/without cues using equipment (OT) Outcome: Completed/Met   Problem: RH Tub/Shower Transfers Goal: LTG Patient will perform tub/shower transfers w/assist (OT) Description: LTG: Patient will perform tub/shower transfers with assist, with/without cues using equipment (OT) Outcome: Completed/Met

## 2020-10-10 NOTE — Discharge Instructions (Signed)
  Inpatient Rehab Discharge Instructions  Mckenzie [name] Discharge date and time: 10/11/20   Activities/Precautions/ Functional Status: Activity: no lifting, driving, or strenuous exercise for till cleared by MD.  Diet: cardiac diet Wound Care: keep wound clean and dry Contact Dr. August Saucer if you develop any problems with your incision/wound--redness, swelling, increase in pain, drainage or if you develop fever or chills.    Functional status:  ___ No restrictions     ___ Walk up steps independently _X__ 24/7 supervision/assistance   ___ Walk up steps with assistance ___ Intermittent supervision/assistance  ___ Bathe/dress independently ___ Walk with walker     ___ Bathe/dress with assistance ___ Walk Independently    ___ Shower independently ___ Walk with assistance    _X__ Shower with assistance _X__ No alcohol     ___ Return to work/school ________  Special Instructions: 1. Maintain touch down weight on left leg. Dr August Saucer will advise you on changes in weight bearing.  2. Need to use baby aspirin twice a day for at least a month to prevent blood clots in your leg.  3. TSH needs to be rechecked in 3 weeks.    COMMUNITY REFERRALS UPON DISCHARGE:    Home Health:   PT, OT , RN                 Agency:[facility] Phone: [phone]  Medical Equipment/Items Ordered:3 IN 1                                                 Agency/Supplier:[facility]  [phone]  My questions have been answered and I understand these instructions. I will adhere to these goals and the provided educational materials after my discharge from the hospital.  Patient/Caregiver Signature _______________________________ Date __________  Clinician Signature _______________________________________ Date __________  Please bring this form and your medication list with you to all your follow-up doctor's appointments.

## 2020-10-10 NOTE — Progress Notes (Signed)
PROGRESS NOTE   Subjective/Complaints: No complaints this morning Pain has been stable LFTs are normal- discussed changing oxycodone back to her home Norco.   ROS: Denies CP, SOB, N/V/D, +incisional site pain with movement  Objective:   No results found. No results for input(s): WBC, HGB, HCT, PLT in the last 72 hours. Recent Labs    10/10/20 0744  NA 135  K 4.0  CL 99  CO2 26  GLUCOSE 169*  BUN 23  CREATININE 0.76  CALCIUM 8.8*    Intake/Output Summary (Last 24 hours) at 10/10/2020 1116 Last data filed at 10/10/2020 1052 Gross per 24 hour  Intake 240 ml  Output 1250 ml  Net -1010 ml        Physical Exam: Vital Signs Blood pressure (!) 141/60, pulse 62, temperature (!) 97.4 F (36.3 C), temperature source Oral, resp. rate 14, height 5\' 2"  (1.575 m), weight 46 kg, SpO2 97 %. Gen: no distress, normal appearing HEENT: oral mucosa pink and moist, NCAT Cardio: Reg rate Chest: normal effort, normal rate of breathing Abd: soft, non-distended Ext: no edema Psych: pleasant, normal affect Skin:  Right forearm ecchymosis Left hip with tenderness Psych: Normal mood.  Somewhat restless Musc: No edema in extremities.  No tenderness in extremities. Neuro: Alert HOH Motor: Bilateral upper extremities: 4+/5 proximal distal Left lower extremity: Hip flexion, knee extension 4/5, ankle dorsiflexion 3 -/5 Right lower extremity: Hip flexion, knee extension, ankle dorsiflexion 4/5  Assessment/Plan: 1. Functional deficits which require 3+ hours per day of interdisciplinary therapy in a comprehensive inpatient rehab setting.  Physiatrist is providing close team supervision and 24 hour management of active medical problems listed below.  Physiatrist and rehab team continue to assess barriers to discharge/monitor patient progress toward functional and medical goals  Care Tool:  Bathing  Bathing activity did not occur:  Refused Body parts bathed by patient: Right arm,Left arm,Abdomen,Chest,Front perineal area,Buttocks,Right upper leg,Left upper leg,Right lower leg,Left lower leg,Face         Bathing assist Assist Level: Supervision/Verbal cueing     Upper Body Dressing/Undressing Upper body dressing   What is the patient wearing?: Pull over shirt    Upper body assist Assist Level: Independent    Lower Body Dressing/Undressing Lower body dressing      What is the patient wearing?: Pants     Lower body assist Assist for lower body dressing: Contact Guard/Touching assist     Toileting Toileting    Toileting assist Assist for toileting: Minimal Assistance - Patient > 75% Assistive Device Comment: foley   Transfers Chair/bed transfer  Transfers assist     Chair/bed transfer assist level: Supervision/Verbal cueing     Locomotion Ambulation   Ambulation assist   Ambulation activity did not occur: Safety/medical concerns  Assist level: Supervision/Verbal cueing Assistive device: Walker-rolling Max distance: 47ft   Walk 10 feet activity   Assist     Assist level: Supervision/Verbal cueing Assistive device: Walker-rolling   Walk 50 feet activity   Assist Walk 50 feet with 2 turns activity did not occur: Safety/medical concerns (fatigue, pain, decreased balance/postural control, weakness)  Assist level: Supervision/Verbal cueing Assistive device: Walker-rolling    Walk 150  feet activity   Assist Walk 150 feet activity did not occur: Safety/medical concerns (fatigue, pain, LLE TDWB status, decreased balance, weakness)         Walk 10 feet on uneven surface  activity   Assist Walk 10 feet on uneven surfaces activity did not occur: Safety/medical concerns (fatigue, pain, decreased balance/postural control, weakness)   Assist level: Contact Guard/Touching assist Assistive device: Walker-rolling   Wheelchair     Assist Will patient use wheelchair at  discharge?: Yes Type of Wheelchair: Manual    Wheelchair assist level: Independent Max wheelchair distance: 139ft    Wheelchair 50 feet with 2 turns activity    Assist        Assist Level: Independent   Wheelchair 150 feet activity     Assist      Assist Level: Independent   Blood pressure (!) 141/60, pulse 62, temperature (!) 97.4 F (36.3 C), temperature source Oral, resp. rate 14, height 5\' 2"  (1.575 m), weight 46 kg, SpO2 97 %.    Medical Problem List and Plan: 1. Left femur fracture  Continue CIR 2.  Impaired mobility: Continue Lovenox             -antiplatelet therapy: ASA--decrease to daily with Lovenox on board.  3. Chronic LBP/Pain Management: Since liver enzymes have normalized, change oxycodone to Norco.   Appears controlled with meds on 4/28 4. Mood: LCSW to follow for evaluation and support.              -antipsychotic agents: N/a 5. Neuropsych: This patient is capable of making decisions on her own behalf. 6. Skin/Wound Care: Routine pressure relief measures.  7. Fluids/Electrolytes/Nutrition: Monitor I/Os. 8. Left hip fracture s/p fixation 04/15: To be TDWB 9. HTN: Monitor BP tid  Continue benzapril.   Continue metoprolol  Labile on 4/27, monitor for trend 10. Acute blood loss anemia:              --added iron supplement. Iron studies reviewed.   Hemoglobin 9.4 on 4/25  Continue to monitor 11. Malnutrition: added protein supplement to help promote wound healing.  12. COPD: Stable on Dulera bid. Encourage IS.  13. CAD s/p stent: Monitor for symptoms with increase in activity.              --on low dose ASA and atorvastatin.  14. IBS-constipation:              --resumed Miralax daily with movantik prn(patient aware to ask)  Stable 16. BOO/Urinary retention: Has had a foley since last summer per Dr. 5/25             --resumed Macrodantin for UTI prophylaxis.  17. 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 Vitamin D screening: 4/22 level  20. Hypoalbuminemia: encouraged high protein foods. Continue protein supplement.  21. Transaminitis: LFTs normalized, changed Percocet to Norco.     LOS: 7 days A FACE TO FACE EVALUATION WAS PERFORMED  5/22 P Mckenzie Hokenson 10/10/2020, 11:16 AM

## 2020-10-10 NOTE — Progress Notes (Signed)
Occupational Therapy Session Note  Patient Details  Name: Mckenzie [name] MRN: [id number] Date of Birth: 22-Dec-1935  Today's Date: 10/10/2020 OT Individual Time: 9935-7017 OT Individual Time Calculation (min): 56 min    Short Term Goals: Week 1:  OT Short Term Goal 1 (Week 1): STG=LTG d/t ELOS  Skilled Therapeutic Interventions/Progress Updates:    Pt received semi-reclined in bed, c/o L hip pain, but did not quantify, agreeable to therapy. Came to sitting EOB with close S and use of bed features, ind able to recall LLE TDWB precautions. Stand-pivot with RW to w/c with close S, good adherence to TDWB. Completed UBB/UBD at sink level with only light min A to adjust bra straps. Completed LBD with use of reacher to doff B socks, and sock aid to don L sock with close S after set-up of sock aid. Able to don/doff brief and pants with close S and use of open cabinet as foot stool. RN present to replace foley bag during session. Transport to and from therapy gym in w/c 2/2 time. Reviewed energy conservation strategies and adaptive techniques for light meal prep and cleaning with both w/c and RW. Pt verbalized understanding and states she should have some help from family to complete IADL. Emphasized rec S for all mobility, verbalized understanding. Pt able to demonstrate moving plate along counter with close S + RW. Returned to room in w/c awaiting next PT session,   Pt left in w/c with call bell in reach, and all immediate needs met.    Therapy Documentation Precautions:  Precautions Precautions: Fall Restrictions Weight Bearing Restrictions: No LLE Weight Bearing: Touchdown weight bearing Pain: Pain Assessment Pain Score: Asleep Faces Pain Scale: No hurt ADL: See Care Tool for more details.   Therapy/Group: Individual Therapy  Volanda Napoleon MS, OTR/L  10/10/2020, 6:46 AM

## 2020-10-10 NOTE — Progress Notes (Signed)
Patient seen 2 days ago She is doing well with no pain with internal rotation of the left leg. Plan is to progress to weightbearing as tolerated.  She is on Lovenox for DVT prophylaxis.  Liver function has normalized she will be switched to Norco.  She will need to follow-up with [place] In 2 weeks for repeat x-rays and likely release at that time Talked  to her nurse who will discontinue the sutures and apply Steri-Strips.  Thank you for that.

## 2020-10-10 NOTE — Progress Notes (Signed)
Physical Therapy Discharge Summary  Patient Details  Name: Mckenzie [name] MRN: [id number] Date of Birth: [birth year]  Patient has met 9 of 9 long term goals due to improved activity tolerance, improved balance, improved postural control, increased strength, increased range of motion, improved awareness and improved coordination. Patient to discharge at a wheelchair level Supervision.  Patient's care partner is independent to provide the necessary physical assistance at discharge. Pt's husband attended family education training on 4/26 and verbalized and demonstrated confidence with all tasks to ensure safe discharge home. Pt with no further questions regarding mobility.   All goals met   Recommendation:  Patient will benefit from ongoing skilled PT services in home health setting to continue to advance safe functional mobility, address ongoing impairments in transfers, generalized strengthening, dynamic standing balance/coordination, gait training, endurance, and to minimize fall risk.  Equipment: No equipment provided; pt already has RW and WC  Reasons for discharge: treatment goals met  Patient/family agrees with progress made and goals achieved: Yes  PT Discharge Precautions/Restrictions Precautions Precautions: Fall Restrictions Weight Bearing Restrictions: Yes LLE Weight Bearing: Touchdown weight bearing Cognition Overall Cognitive Status: Within Functional Limits for tasks assessed Arousal/Alertness: Awake/alert Orientation Level: Oriented X4 Memory: Appears intact Awareness: Appears intact Problem Solving: Appears intact Safety/Judgment: Appears intact Comments: difficulty maintaining LLE TDWB precautions Sensation Sensation Light Touch: Impaired by gross assessment Proprioception: Appears Intact Additional Comments: pt reports decreased sensation along L lateral malleoli and L great toe Coordination Gross Motor Movements are Fluid and Coordinated: No Fine Motor  Movements are Fluid and Coordinated: Yes Coordination and Movement Description: uncoordinated due to LLE TDWB precautions, decreased balance/postural control, generalized weakness and decreased endurance Finger Nose Finger Test: [facility] bilaterally Heel Shin Test: WFL on RLE; decreased ROM on LLE using UEs to assist Motor  Motor Motor: Abnormal postural alignment and control Motor - Skilled Clinical Observations: uncoordinated due to LLE TDWB precautions, decreased balance/postural control, generalized weakness and decreased endurance  Mobility Bed Mobility Bed Mobility: Rolling Right;Rolling Left;Sit to Supine;Supine to Sit Rolling Right: Independent with assistive device Rolling Left: Independent with assistive device Supine to Sit: Independent with assistive device Sit to Supine: Independent with assistive device Transfers Transfers: Sit to Stand;Stand to Sit;Stand Pivot Transfers Sit to Stand: Supervision/Verbal cueing Stand to Sit: Supervision/Verbal cueing Stand Pivot Transfers: Supervision/Verbal cueing Stand Pivot Transfer Details: Verbal cues for safe use of DME/AE;Verbal cues for precautions/safety Stand Pivot Transfer Details (indicate cue type and reason): verbal cues for LLE TDWB precautions Transfer (Assistive device): Rolling walker Locomotion  Gait Ambulation: Yes Gait Assistance: Supervision/Verbal cueing Gait Distance (Feet): 90 Feet Assistive device: Rolling walker Gait Assistance Details: Verbal cues for precautions/safety;Verbal cues for safe use of DME/AE Gait Assistance Details: verbal cues to maintain LLE TDWB precautions and for energy conservation Gait Gait: Yes Gait Pattern: Impaired Gait Pattern: Step-to pattern;Decreased trunk rotation;Antalgic;Decreased stride length;Poor foot clearance - right;Decreased step length - right Gait velocity: decreased Stairs / Additional Locomotion Stairs: Yes (bump up curb in WC dependently due to difficulty maintaining WB  precautions) Stairs Assistance: Dependent - Patient 0% Stair Management Technique: Wheelchair Number of Stairs: 1 Height of Stairs: 6 Ramp: Contact Guard/touching assist (RW) Product manager Mobility: Yes Wheelchair Assistance: Independent with Camera operator: Both upper extremities Wheelchair Parts Management: Supervision/cueing Distance: 161f  Trunk/Postural Assessment  Cervical Assessment Cervical Assessment: Exceptions to [facility](forward head) Thoracic Assessment Thoracic Assessment: Exceptions to [facility](kyphosis) Lumbar Assessment Lumbar Assessment: Exceptions to [facility](posterior pelvic tilt) Postural Control Postural  Control: Deficits on evaluation  Balance Balance Balance Assessed: Yes Static Sitting Balance Static Sitting - Balance Support: Feet supported;Bilateral upper extremity supported Static Sitting - Level of Assistance: 7: Independent Dynamic Sitting Balance Dynamic Sitting - Balance Support: Feet supported;No upper extremity supported Dynamic Sitting - Level of Assistance: 6: Modified independent (Device/Increase time) Static Standing Balance Static Standing - Balance Support: Bilateral upper extremity supported (RW) Static Standing - Level of Assistance: 5: Stand by assistance (supervision) Dynamic Standing Balance Dynamic Standing - Balance Support: Bilateral upper extremity supported (RW) Dynamic Standing - Level of Assistance: 5: Stand by assistance (supervision) Extremity Assessment  RLE Assessment RLE Assessment: Exceptions to [facility] General Strength Comments: grossly generalized to 4/5 LLE Assessment LLE Assessment: Exceptions to [facility] General Strength Comments: grossly generalized to 4-/5 (except hip flexion, hip abduction, and DF 3/5)  Anna M Demos  Anna Martorana PT, DPT  10/10/2020, 7:32 AM

## 2020-10-10 NOTE — Progress Notes (Signed)
Physical Therapy Session Note  Patient Details  Name: Mckenzie [name] MRN: [id number] Date of Birth: Feb 27, 1936  Today's Date: 10/10/2020 PT Individual Time: 2355-7322 and 1100-1154 PT Individual Time Calculation (min): 53 min and 54 min  Short Term Goals: Week 1:  PT Short Term Goal 1 (Week 1): STG=LTG due to LOS  Skilled Therapeutic Interventions/Progress Updates:   Treatment Session 1: 0900-0953 53 min Received pt sitting in WC finishing OT session, pt agreeable to therapy, and reported pain 5/10 in LLE (premedicated). Repositioning and rest breaks done to reduce pain levels. Session with emphasis on discharge planning, functional mobility/transfers, generalized strengthening, dynamic standing balance/coordination, gait training, simulated car transfers, and improved activity tolerance. Noted catheter bag leaking. Notified RN and RN present to tighten spout. Doffed soiled pants with min A and donned clean scrub pants with close supervision. Pt performed WC mobility 116ft x 1 and 19ft x 1 mod I. Pt performed simulated car transfer with RW and supervision but required use of UEs to assist LLE into car. Pt ambulated 70ft on uneven surfaces (ramp) with RW and CGA/close supervision. Pt continues to require cues to maintain LLE TDWB precautions and to push RW on floor rather than picking it up. Pt reported urge to have BM and transported to nearest bathroom in WC total A. Pt required CGA for standing balance while managing clothing and with small BM smear. Pt completed majority of peri-care standing with CGA but required total A for remainder of peri-care for time purposes. Pt stood at sink and washed hands with supervision. Performed TUG with RW and supervision for the following trials with average of 40.6 seconds: Trial 1: 45 seconds Trial 2: 40 seconds Trial 3: 37 seconds Pt educated on test results and significance indicating high fall risk and emphasized use of RW upon discharge; pt in  agreement. Pt transported back to room in [facility] total A. Concluded session with pt sitting in WC, needs within reach, and chair pad alarm on.   Treatment Session 2: 1100-1154 54 min Received pt sitting in WC pt agreeable to therapy, and reports continued pain 5/10 in LLE (premedicated). Session with emphasis on functional mobility/transfers, generalized strengthening, dynamic standing balance/coordination, gait training, NMR, and improved activity tolerance. Pt transported to 4W dayroom in [facility] total A for time management purposes. Engaged in dynamic standing balance playing connect four x 3 trials using RW and supervision for balance with emphasis on problem solving and reaching outside BOS. Pt demonstrating good adherence to LLE TDWB precautions during activity. Pt then ambulated 63ft with RW and supervision but continues to demonstrate poor adherence to LLE TDWB precautions placing ~25% weight through L toes. Pt still requires cues for energy conservation as pt will continue to push walking despite fatigue and increased pain and despite acknowledging that she is placing more weight than she is supposed to on her LLE. Transported to ortho gym and worked on Tree surgeon with RW and CGA performing the following activities on BITS with emphasis on visual scanning, problem solving, memory, and sequencing: -Geoboards memory - 100% accurate 2/3 trials for 40 seconds -Memory sequencing - pt able to accurately sequence 2-3 words/images at a time but unable to complete 5 sequence -Trail making part A - for 1 minute 45 seconds with 0 errors -Trail making part B - for 3 minutes and 22 seconds with 10 errors Pt transported back to room in [facility] total A and transferred WC<>recliner with RW and supervision. Concluded session with pt sitting in  recliner, needs within reach, and chair pad alarm on.   Therapy Documentation Precautions:  Precautions Precautions: Fall Restrictions Weight Bearing Restrictions:  No LLE Weight Bearing: Touchdown weight bearing  Therapy/Group: Individual Therapy Luisana Lutzke PT, DPT   10/10/2020, 7:26 AM

## 2020-10-11 MED ORDER — NITROFURANTOIN MACROCRYSTAL 100 MG PO CAPS: 100.0000 mg | ORAL_CAPSULE | Freq: Every day | ORAL | Status: DC

## 2020-10-11 MED ORDER — METHOCARBAMOL 500 MG PO TABS: 500.0000 mg | ORAL_TABLET | Freq: Four times a day (QID) | ORAL | 0 refills | Status: DC | PRN

## 2020-10-11 MED ORDER — ACETAMINOPHEN 325 MG PO TABS: 325.0000 mg | ORAL_TABLET | ORAL | Status: DC | PRN

## 2020-10-11 MED ORDER — HYDROCODONE-ACETAMINOPHEN 10-325 MG PO TABS: 1.0000 | ORAL_TABLET | ORAL | 0 refills | Status: AC | PRN

## 2020-10-11 MED ORDER — ASPIRIN 81 MG PO CHEW: 81.0000 mg | CHEWABLE_TABLET | Freq: Two times a day (BID) | ORAL | 0 refills | Status: DC

## 2020-10-11 MED ORDER — LEVOTHYROXINE SODIUM 25 MCG PO TABS: 25.0000 ug | ORAL_TABLET | Freq: Every day | ORAL | 0 refills | Status: DC

## 2020-10-11 MED ORDER — MOMETASONE FURO-FORMOTEROL FUM 100-5 MCG/ACT IN AERO: 2.0000 | INHALATION_SPRAY | Freq: Two times a day (BID) | RESPIRATORY_TRACT | 0 refills | Status: DC

## 2020-10-11 MED ORDER — POLYSACCHARIDE IRON COMPLEX 150 MG PO CAPS: 150.0000 mg | ORAL_CAPSULE | Freq: Every day | ORAL | 0 refills | Status: DC

## 2020-10-11 NOTE — Progress Notes (Signed)
PROGRESS NOTE   Subjective/Complaints: No complaints this morning Pain stable Discussed that LFTs are normal and we will transition back to home Norco.   ROS: Denies CP, SOB, N/V/D, +incisional site pain with movement, +insomnia  Objective:   No results found. No results for input(s): WBC, HGB, HCT, PLT in the last 72 hours. Recent Labs    10/10/20 0744  NA 135  K 4.0  CL 99  CO2 26  GLUCOSE 169*  BUN 23  CREATININE 0.76  CALCIUM 8.8*    Intake/Output Summary (Last 24 hours) at 10/11/2020 0929 Last data filed at 10/11/2020 8546 Gross per 24 hour  Intake 300 ml  Output 1400 ml  Net -1100 ml        Physical Exam: Vital Signs Blood pressure 128/65, pulse 66, temperature 97.6 F (36.4 C), temperature source Oral, resp. rate 18, height 5\' 2"  (1.575 m), weight 48.8 kg, SpO2 100 %. Gen: no distress, normal appearing HEENT: oral mucosa pink and moist, NCAT Cardio: Reg rate Chest: normal effort, normal rate of breathing Abd: soft, non-distended Ext: no edema Psych: pleasant, normal affect Skin:  Right forearm ecchymosis Left hip with tenderness Psych: Normal mood.  Somewhat restless Musc: No edema in extremities.  No tenderness in extremities. Neuro: Alert HOH Motor: Bilateral upper extremities: 4+/5 proximal distal Left lower extremity: Hip flexion, knee extension 4/5, ankle dorsiflexion 3 -/5 Right lower extremity: Hip flexion, knee extension, ankle dorsiflexion 4/5  Assessment/Plan: 1. Functional deficits which require 3+ hours per day of interdisciplinary therapy in a comprehensive inpatient rehab setting.  Physiatrist is providing close team supervision and 24 hour management of active medical problems listed below.  Physiatrist and rehab team continue to assess barriers to discharge/monitor patient progress toward functional and medical goals  Care Tool:  Bathing  Bathing activity did not occur:  Refused Body parts bathed by patient: Right arm,Left arm,Abdomen,Chest,Front perineal area,Buttocks,Right upper leg,Left upper leg,Right lower leg,Left lower leg,Face         Bathing assist Assist Level: Supervision/Verbal cueing     Upper Body Dressing/Undressing Upper body dressing   What is the patient wearing?: Pull over shirt    Upper body assist Assist Level: Supervision/Verbal cueing    Lower Body Dressing/Undressing Lower body dressing      What is the patient wearing?: Pants,Underwear/pull up     Lower body assist Assist for lower body dressing: Supervision/Verbal cueing     Toileting Toileting    Toileting assist Assist for toileting: Supervision/Verbal cueing Assistive Device Comment: foley   Transfers Chair/bed transfer  Transfers assist     Chair/bed transfer assist level: Supervision/Verbal cueing     Locomotion Ambulation   Ambulation assist   Ambulation activity did not occur: Safety/medical concerns  Assist level: Supervision/Verbal cueing Assistive device: Walker-rolling Max distance: 35ft   Walk 10 feet activity   Assist     Assist level: Supervision/Verbal cueing Assistive device: Walker-rolling   Walk 50 feet activity   Assist Walk 50 feet with 2 turns activity did not occur: Safety/medical concerns (fatigue, pain, decreased balance/postural control, weakness)  Assist level: Supervision/Verbal cueing Assistive device: Walker-rolling    Walk 150 feet activity  Assist Walk 150 feet activity did not occur: Safety/medical concerns (fatigue, pain, LLE TDWB status, decreased balance, weakness)         Walk 10 feet on uneven surface  activity   Assist Walk 10 feet on uneven surfaces activity did not occur: Safety/medical concerns (fatigue, pain, decreased balance/postural control, weakness)   Assist level: Contact Guard/Touching assist Assistive device: Walker-rolling   Wheelchair     Assist Will patient use  wheelchair at discharge?: Yes Type of Wheelchair: Manual    Wheelchair assist level: Independent Max wheelchair distance: 169ft    Wheelchair 50 feet with 2 turns activity    Assist        Assist Level: Independent   Wheelchair 150 feet activity     Assist      Assist Level: Independent   Blood pressure 128/65, pulse 66, temperature 97.6 F (36.4 C), temperature source Oral, resp. rate 18, height 5\' 2"  (1.575 m), weight 48.8 kg, SpO2 100 %.    Medical Problem List and Plan: 1. Left femur fracture  Dc home today 2.  Impaired mobility: d/c Lovenox             -antiplatelet therapy: ASA--decrease to daily with Lovenox on board.  3. Chronic LBP/Pain Management: Since liver enzymes have normalized, change oxycodone to Norco. Provided with list of foods that can help alleviate pain  Appears controlled with meds on 4/29, continue Norco 4. Mood: LCSW to follow for evaluation and support.              -antipsychotic agents: N/a 5. Neuropsych: This patient is capable of making decisions on her own behalf. 6. Skin/Wound Care: Routine pressure relief measures.  7. Fluids/Electrolytes/Nutrition: Monitor I/Os. 8. Left hip fracture s/p fixation 04/15: To be TDWB 9. HTN: Monitor BP tid  Continue benzapril.   Continue metoprolol  Labile on 4/27, monitor for trend 10. Acute blood loss anemia:              --added iron supplement. Iron studies reviewed.   Hemoglobin 9.4 on 4/25  Continue to monitor 11. Malnutrition: added protein supplement to help promote wound healing.  12. COPD: Stable on Dulera bid. Encourage IS.  13. CAD s/p stent: Monitor for symptoms with increase in activity.              --on low dose ASA and atorvastatin.  14. IBS-constipation:              --resumed Miralax daily with movantik prn(patient aware to ask)  Stable 16. BOO/Urinary retention: Has had a foley since last summer per Dr. 5/25             --resumed Macrodantin for UTI prophylaxis.  17.  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 Vitamin D screening: 4/22 level  20. Hypoalbuminemia: encouraged high protein foods. Continue protein supplement.  21. Transaminitis: LFTs normalized, changed Percocet to Norco. Discussed with patient.     >30 minutes spent in discharge of patient including review of medications and follow-up appointments, physical examination, and in answering all patient's questions  LOS: 8 days A FACE TO FACE EVALUATION WAS PERFORMED  5/22 Derrill Bagnell 10/11/2020, 9:29 AM

## 2020-10-11 NOTE — Discharge Summary (Signed)
Physician Discharge Summary  Patient ID: Mckenzie [name] MRN: [id number] DOB/AGE: Oct 22, 1935 85 y.o.  Admit date: 10/03/2020 Discharge date: 10/11/2020  Discharge Diagnoses:  Principal Problem:   Femur fracture, left (HCC) Active Problems:   Back pain   Acute blood loss anemia   Essential hypertension   Postoperative pain   Discharged Condition: stable   Significant Diagnostic Studies: N/a   Labs:  Basic Metabolic Panel: BMP Latest Ref Rng & Units 10/10/2020 10/07/2020 10/04/2020  Glucose 70 - 99 mg/dL 761(P) 94 98  BUN 8 - 23 mg/dL 23 19 15   Creatinine 0.44 - 1.00 mg/dL 5.09 3.26  Sodium 135 - 145 mmol/L 135 137 139  Potassium 3.5 - 5.1 mmol/L 4.0 4.3 4.2  Chloride 98 - 111 mmol/L 99 102 104  CO2 22 - 32 mmol/L 26 29 26   Calcium 8.9 - 10.3 mg/dL 7.12) ) 9.0    CBC: CBC Latest Ref Rng & Units 10/07/2020 10/04/2020 10/02/2020  WBC 4.0 - 10.5 K/uL 8.8 8.7 7.3  Hemoglobin 12.0 - 15.0 g/dL 10/06/2020) 10/04/2020) 8.3(J)  Hematocrit 36.0 - 46.0 % 29.9(L) 29.9(L) 24.7(L)  Platelets 150 - 400 K/uL 225 198 149(L)    CBG: No results for input(s): GLUCAP in the last 168 hours.  Brief HPI:   Mckenzie [name] is a 85 y.o. female with history of CAD, COPD, chronic UTI w/indwelling foley who was admitted on 09/27/20 after a fall with subsequent Left IT hip fracture. She underwent IM fixation of femur and post op to be TDWB per Dr. 91. Hospital course significant for ABLA requiring 2 units PRBC, issues with hypotension as well as decrease in TSH to 0.111 therefore supplement decreased to 25 mcg/day. Therapy was ongoing but patient was limited by WB restrictions, weakness as well as balance deficits. CIR was recommended due to functional decline.    Hospital Course: Mckenzie [name] was admitted to rehab 10/03/2020 for inpatient therapies to consist of PT and OT at least three hours five days a week. Past admission physiatrist, therapy team and rehab RN have worked together to provide  customized collaborative inpatient rehab. Her blood pressures were monitored on TID basis and has been stable. Macrodantin was resumed for UTI prophylaxis. Follow up labs showed abnormal LFTs which has been monitored. CBC showed that thrombocytopenia has resolved and H/H is slowly improving.   Lovenox was decreased to once a day for DVT prophylaxis and she was transitioned to low dose ASA bid for 4 additional weeks after d/c. Her lytes WNL and abnormal LFTs have resolved. As pain control is improving, she was transitioned back to Norco per home regimen. Her hip incision is healing well and sutures were removed prior to discharge.   She is showing improvement in ability to maintain TDWB but continued to have decreased endurance. Supervision is recommended with use of wheelchair and walker for safety. She will continue to receive follow up HHPT and HHOT by [facility] after discharge.    Rehab course: During patient's stay in rehab weekly team conferences were held to monitor patient's progress, set goals and discuss barriers to discharge. At admission, patient required mod assist with ADL tasks and min assist with mobility.  She  has had improvement in activity tolerance, balance, postural control as well as ability to compensate for deficits.  She is able to complete ADL tasks with supervision for safety. She requires supervision for transfers and to ambulate 1' with RW and cues to maintain TDWB. Family education was  completed with husband.   Disposition: Home  Diet: Heart Healthy.   Special Instructions: 1. Continue TDWB LLE till cleared by Dr. August Saucer. 2. Will need TSH repeated in 3-4 weeks with recent medication change. 3. Use ASA 81 mg bid X 30 days for DVT prophylaxis.     Allergies as of 10/11/2020      Reactions   Tetracycline Nausea Only      Medication List    STOP taking these medications   Oxycodone HCl 10 MG Tabs     TAKE these medications   acetaminophen 325 MG  tablet Commonly known as: TYLENOL Take 1-2 tablets (325-650 mg total) by mouth every 4 (four) hours as needed for mild pain.   aspirin 81 MG chewable tablet Chew 1 tablet (81 mg total) by mouth 2 (two) times daily.   atorvastatin 10 MG tablet Commonly known as: LIPITOR Take 10 mg by mouth daily.   benazepril 5 MG tablet Commonly known as: LOTENSIN Take 5 mg by mouth daily.   CALCIUM 600 PO Take 1,200 mg by mouth daily.   conjugated estrogens vaginal cream Commonly known as: PREMARIN Place 1 Applicatorful vaginally daily.   diphenhydrAMINE 25 mg capsule Commonly known as: BENADRYL Take 25 mg by mouth daily as needed for sleep.   esomeprazole 40 MG capsule Commonly known as: NEXIUM Take 40 mg by mouth daily before breakfast.   HYDROcodone-acetaminophen 10-325 MG tablet Commonly known as: NORCO Take 1 tablet by mouth every 4 (four) hours as needed for severe pain.   hyoscyamine 0.125 MG SL tablet Commonly known as: LEVSIN SL Take 0.125 mg by mouth every 6 (six) hours as needed for cramping or diarrhea or loose stools.   iron polysaccharides 150 MG capsule Commonly known as: NIFEREX Take 1 capsule (150 mg total) by mouth daily.   levothyroxine 25 MCG tablet Commonly known as: SYNTHROID Take 1 tablet (25 mcg total) by mouth daily at 6 (six) AM.   Melatonin 10 MG Tabs Take 1 tablet by mouth at bedtime.   methocarbamol 500 MG tablet Commonly known as: ROBAXIN Take 1 tablet (500 mg total) by mouth every 6 (six) hours as needed for muscle spasms.   mometasone-formoterol 100-5 MCG/ACT Aero Commonly known as: DULERA Inhale 2 puffs into the lungs 2 (two) times daily.   Movantik 25 MG Tabs tablet Generic drug: naloxegol oxalate Take 25 mg by mouth daily.   multivitamin tablet Take 1 tablet by mouth daily.   nitroGLYCERIN 0.4 MG SL tablet Commonly known as: NITROSTAT Place one tablet under tongue every 5 minutes up to 3 doses as needed for chest pain. No more than  3 doses over a 15 minute period.   polyethylene glycol 17 g packet Commonly known as: MIRALAX / GLYCOLAX Take 17 g by mouth every morning.   raloxifene 60 MG tablet Commonly known as: EVISTA Take 60 mg by mouth daily.   tamsulosin 0.4 MG Caps capsule Commonly known as: FLOMAX Take 0.4 mg by mouth daily.   temazepam 30 MG capsule Commonly known as: RESTORIL Take 30 mg by mouth at bedtime.   traZODone 50 MG tablet Commonly known as: DESYREL Take 50 mg by mouth at bedtime as needed for sleep.   tretinoin 0.05 % cream Commonly known as: RETIN-A Apply 1 application topically at bedtime.   Vitamin D3 25 MCG (1000 UT) Caps Take 1,000 Units by mouth daily. Take one tab daily       Follow-up Information    Wayland Denis  E, PA-C. Call.   Specialty: Physician Assistant Why: for post hospital follow up and repeat TSH in 3 weeks. Contact information: [address] [place] [address] [phone]        Horton Chin, MD Follow up.   Specialty: Physical Medicine and Rehabilitation Why: 7/21 please arrive at 11:20 for 11:40am appointment Contact information: [address] [place] [address] [phone]        Cammy Copa, MD. Call on 10/14/2020.   Specialty: Orthopedic Surgery Why: for post op appointment Contact information: [address] [place] [address] [phone]               Signed: Jacquelynn Cree 10/14/2020, 10:34 AM

## 2020-10-11 NOTE — Progress Notes (Signed)
Received patient in wheelchair. AOX4, respiration even and unlabored, VSS. Discharge instruction provided. Patient being escorted by Nurse Tech to car. Patient has all personal belongings.

## 2020-10-12 ENCOUNTER — Other Ambulatory Visit: Payer: Self-pay | Admitting: Physical Medicine and Rehabilitation

## 2020-10-12 DIAGNOSIS — N323 Diverticulum of bladder: Secondary | ICD-10-CM | POA: Diagnosis not present

## 2020-10-12 DIAGNOSIS — M5416 Radiculopathy, lumbar region: Secondary | ICD-10-CM | POA: Diagnosis not present

## 2020-10-12 DIAGNOSIS — N320 Bladder-neck obstruction: Secondary | ICD-10-CM | POA: Diagnosis not present

## 2020-10-12 DIAGNOSIS — Z466 Encounter for fitting and adjustment of urinary device: Secondary | ICD-10-CM | POA: Diagnosis not present

## 2020-10-12 DIAGNOSIS — R339 Retention of urine, unspecified: Secondary | ICD-10-CM | POA: Diagnosis not present

## 2020-10-12 DIAGNOSIS — M810 Age-related osteoporosis without current pathological fracture: Secondary | ICD-10-CM | POA: Diagnosis not present

## 2020-10-12 MED ORDER — NITROFURANTOIN MONOHYD MACRO 100 MG PO CAPS: 100.0000 mg | ORAL_CAPSULE | Freq: Every day | ORAL | 0 refills | Status: AC

## 2020-10-12 MED ORDER — NITROFURANTOIN MACROCRYSTAL 100 MG PO CAPS: 100.0000 mg | ORAL_CAPSULE | Freq: Every day | ORAL | 0 refills | Status: AC

## 2020-10-12 MED ORDER — METOPROLOL TARTRATE 25 MG PO TABS: ORAL_TABLET | ORAL | 0 refills | Status: DC

## 2020-10-14 ENCOUNTER — Telehealth: Payer: Self-pay

## 2020-10-14 DIAGNOSIS — N320 Bladder-neck obstruction: Secondary | ICD-10-CM | POA: Diagnosis not present

## 2020-10-14 DIAGNOSIS — M810 Age-related osteoporosis without current pathological fracture: Secondary | ICD-10-CM | POA: Diagnosis not present

## 2020-10-14 DIAGNOSIS — N323 Diverticulum of bladder: Secondary | ICD-10-CM | POA: Diagnosis not present

## 2020-10-14 DIAGNOSIS — Z466 Encounter for fitting and adjustment of urinary device: Secondary | ICD-10-CM | POA: Diagnosis not present

## 2020-10-14 DIAGNOSIS — M5416 Radiculopathy, lumbar region: Secondary | ICD-10-CM | POA: Diagnosis not present

## 2020-10-14 DIAGNOSIS — R339 Retention of urine, unspecified: Secondary | ICD-10-CM | POA: Diagnosis not present

## 2020-10-14 NOTE — Telephone Encounter (Signed)
Stacey from [employer] called she is requesting patients weight bearing status , what procedure was done to fix issue and she is requesting a verbal order for approval for physical therapy in the home call back:[phone] lvm

## 2020-10-14 NOTE — Telephone Encounter (Signed)
Can you please advise?

## 2020-10-16 DIAGNOSIS — R339 Retention of urine, unspecified: Secondary | ICD-10-CM | POA: Diagnosis not present

## 2020-10-16 DIAGNOSIS — M810 Age-related osteoporosis without current pathological fracture: Secondary | ICD-10-CM | POA: Diagnosis not present

## 2020-10-16 DIAGNOSIS — N320 Bladder-neck obstruction: Secondary | ICD-10-CM | POA: Diagnosis not present

## 2020-10-16 DIAGNOSIS — Z466 Encounter for fitting and adjustment of urinary device: Secondary | ICD-10-CM | POA: Diagnosis not present

## 2020-10-16 DIAGNOSIS — M5416 Radiculopathy, lumbar region: Secondary | ICD-10-CM | POA: Diagnosis not present

## 2020-10-16 DIAGNOSIS — N323 Diverticulum of bladder: Secondary | ICD-10-CM | POA: Diagnosis not present

## 2020-10-17 DIAGNOSIS — M810 Age-related osteoporosis without current pathological fracture: Secondary | ICD-10-CM | POA: Diagnosis not present

## 2020-10-17 DIAGNOSIS — Z466 Encounter for fitting and adjustment of urinary device: Secondary | ICD-10-CM | POA: Diagnosis not present

## 2020-10-17 DIAGNOSIS — S7292XA Unspecified fracture of left femur, initial encounter for closed fracture: Secondary | ICD-10-CM | POA: Diagnosis not present

## 2020-10-17 DIAGNOSIS — N320 Bladder-neck obstruction: Secondary | ICD-10-CM | POA: Diagnosis not present

## 2020-10-17 DIAGNOSIS — N323 Diverticulum of bladder: Secondary | ICD-10-CM | POA: Diagnosis not present

## 2020-10-17 DIAGNOSIS — M5416 Radiculopathy, lumbar region: Secondary | ICD-10-CM | POA: Diagnosis not present

## 2020-10-17 DIAGNOSIS — R339 Retention of urine, unspecified: Secondary | ICD-10-CM | POA: Diagnosis not present

## 2020-10-17 DIAGNOSIS — E039 Hypothyroidism, unspecified: Secondary | ICD-10-CM | POA: Diagnosis not present

## 2020-10-17 DIAGNOSIS — D62 Acute posthemorrhagic anemia: Secondary | ICD-10-CM | POA: Diagnosis not present

## 2020-10-17 NOTE — Telephone Encounter (Signed)
See below. Calling again. See other note also sent.

## 2020-10-17 NOTE — Telephone Encounter (Signed)
Misty Stanley with [facility] called checking on the verbal orders she placed Monday. She states she has an appt with the pt in a little bit and really needs this clarification; Misty Stanley would like a CB ASAP.   Misty Stanley # [phone] * Ok to LVM

## 2020-10-18 DIAGNOSIS — N320 Bladder-neck obstruction: Secondary | ICD-10-CM | POA: Diagnosis not present

## 2020-10-18 DIAGNOSIS — N323 Diverticulum of bladder: Secondary | ICD-10-CM | POA: Diagnosis not present

## 2020-10-18 DIAGNOSIS — M810 Age-related osteoporosis without current pathological fracture: Secondary | ICD-10-CM | POA: Diagnosis not present

## 2020-10-18 DIAGNOSIS — M5416 Radiculopathy, lumbar region: Secondary | ICD-10-CM | POA: Diagnosis not present

## 2020-10-18 DIAGNOSIS — Z466 Encounter for fitting and adjustment of urinary device: Secondary | ICD-10-CM | POA: Diagnosis not present

## 2020-10-18 DIAGNOSIS — R339 Retention of urine, unspecified: Secondary | ICD-10-CM | POA: Diagnosis not present

## 2020-10-18 NOTE — Telephone Encounter (Signed)
Called Misty Stanley she is aware.

## 2020-10-18 NOTE — Telephone Encounter (Signed)
Okay for WBAT with walker

## 2020-10-21 ENCOUNTER — Encounter: Payer: Medicare Other | Admitting: Registered Nurse

## 2020-10-21 DIAGNOSIS — M5416 Radiculopathy, lumbar region: Secondary | ICD-10-CM | POA: Diagnosis not present

## 2020-10-21 DIAGNOSIS — Z466 Encounter for fitting and adjustment of urinary device: Secondary | ICD-10-CM | POA: Diagnosis not present

## 2020-10-21 DIAGNOSIS — N320 Bladder-neck obstruction: Secondary | ICD-10-CM | POA: Diagnosis not present

## 2020-10-21 DIAGNOSIS — M810 Age-related osteoporosis without current pathological fracture: Secondary | ICD-10-CM | POA: Diagnosis not present

## 2020-10-21 DIAGNOSIS — N323 Diverticulum of bladder: Secondary | ICD-10-CM | POA: Diagnosis not present

## 2020-10-21 DIAGNOSIS — R339 Retention of urine, unspecified: Secondary | ICD-10-CM | POA: Diagnosis not present

## 2020-10-22 ENCOUNTER — Telehealth: Payer: Self-pay | Admitting: Orthopedic Surgery

## 2020-10-22 DIAGNOSIS — M810 Age-related osteoporosis without current pathological fracture: Secondary | ICD-10-CM | POA: Diagnosis not present

## 2020-10-22 DIAGNOSIS — N320 Bladder-neck obstruction: Secondary | ICD-10-CM | POA: Diagnosis not present

## 2020-10-22 DIAGNOSIS — N323 Diverticulum of bladder: Secondary | ICD-10-CM | POA: Diagnosis not present

## 2020-10-22 DIAGNOSIS — R339 Retention of urine, unspecified: Secondary | ICD-10-CM | POA: Diagnosis not present

## 2020-10-22 DIAGNOSIS — Z466 Encounter for fitting and adjustment of urinary device: Secondary | ICD-10-CM | POA: Diagnosis not present

## 2020-10-22 DIAGNOSIS — M5416 Radiculopathy, lumbar region: Secondary | ICD-10-CM | POA: Diagnosis not present

## 2020-10-22 NOTE — Telephone Encounter (Signed)
Note written and faxed 

## 2020-10-22 NOTE — Telephone Encounter (Signed)
[facility] called. They would like something in writing stating what patient needs before a dental procedure. Fax number is [phone]

## 2020-10-23 ENCOUNTER — Ambulatory Visit (INDEPENDENT_AMBULATORY_CARE_PROVIDER_SITE_OTHER): Payer: Medicare Other

## 2020-10-23 ENCOUNTER — Ambulatory Visit (INDEPENDENT_AMBULATORY_CARE_PROVIDER_SITE_OTHER): Payer: Medicare Other | Admitting: Orthopedic Surgery

## 2020-10-23 DIAGNOSIS — G8918 Other acute postprocedural pain: Secondary | ICD-10-CM | POA: Diagnosis not present

## 2020-10-23 DIAGNOSIS — M5416 Radiculopathy, lumbar region: Secondary | ICD-10-CM | POA: Diagnosis not present

## 2020-10-23 DIAGNOSIS — N320 Bladder-neck obstruction: Secondary | ICD-10-CM | POA: Diagnosis not present

## 2020-10-23 DIAGNOSIS — Z466 Encounter for fitting and adjustment of urinary device: Secondary | ICD-10-CM | POA: Diagnosis not present

## 2020-10-23 DIAGNOSIS — R339 Retention of urine, unspecified: Secondary | ICD-10-CM | POA: Diagnosis not present

## 2020-10-23 DIAGNOSIS — I252 Old myocardial infarction: Secondary | ICD-10-CM | POA: Diagnosis not present

## 2020-10-23 DIAGNOSIS — M810 Age-related osteoporosis without current pathological fracture: Secondary | ICD-10-CM | POA: Diagnosis not present

## 2020-10-23 DIAGNOSIS — Z9981 Dependence on supplemental oxygen: Secondary | ICD-10-CM | POA: Diagnosis not present

## 2020-10-23 DIAGNOSIS — N323 Diverticulum of bladder: Secondary | ICD-10-CM | POA: Diagnosis not present

## 2020-10-23 DIAGNOSIS — S72002D Fracture of unspecified part of neck of left femur, subsequent encounter for closed fracture with routine healing: Secondary | ICD-10-CM | POA: Diagnosis not present

## 2020-10-23 MED ORDER — METHOCARBAMOL 500 MG PO TABS: ORAL_TABLET | ORAL | 0 refills | Status: DC

## 2020-10-24 ENCOUNTER — Encounter: Payer: Self-pay | Admitting: Orthopedic Surgery

## 2020-10-24 DIAGNOSIS — M810 Age-related osteoporosis without current pathological fracture: Secondary | ICD-10-CM | POA: Diagnosis not present

## 2020-10-24 DIAGNOSIS — N323 Diverticulum of bladder: Secondary | ICD-10-CM | POA: Diagnosis not present

## 2020-10-24 DIAGNOSIS — R339 Retention of urine, unspecified: Secondary | ICD-10-CM | POA: Diagnosis not present

## 2020-10-24 DIAGNOSIS — N320 Bladder-neck obstruction: Secondary | ICD-10-CM | POA: Diagnosis not present

## 2020-10-24 DIAGNOSIS — S72002D Fracture of unspecified part of neck of left femur, subsequent encounter for closed fracture with routine healing: Secondary | ICD-10-CM | POA: Diagnosis not present

## 2020-10-24 DIAGNOSIS — Z466 Encounter for fitting and adjustment of urinary device: Secondary | ICD-10-CM | POA: Diagnosis not present

## 2020-10-24 NOTE — Progress Notes (Signed)
Post-Op Visit Note   Patient: Mckenzie [name]           Date of Birth: Oct 21, 1935           MRN: [id number] Visit Date: 10/23/2020 PCP: Royann Shivers, PA-C   Assessment & Plan:  Chief Complaint:  Chief Complaint  Patient presents with  . Other     09/27/20 Left IM intertroch   Visit Diagnoses:  1. Postoperative pain     Plan: Patient is a 85 year old female who presents s/p left femur IM nail for intertrochanteric fracture on 09/27/2020.  She is doing well overall.  Main complaint is some throbbing pain in the anterior thigh.  Denies any significant groin pain or lateral hip pain.  She is currently partial weightbearing and ambulating with a walker.  She is back at her home where she is receiving home health physical therapy 2 times per week.  She has had 1 session so far.  On exam she has incisions that are healing well without any evidence of infection or dehiscence.  She has no pain with hip range of motion and no pain with axially loading the left hip joint.  She does have edema throughout the left calf with pitting edema  Very mild calf tenderness.  Negative Homans' sign.  She is taking aspirin daily.  Radiographs were reviewed today and show hardware in excellent position with good compression at the fracture site and no interval fracture or compromise of the hardware noted.  Small amount of callus noted on radiographs.  Plan to progress patient's weightbearing status to full weightbearing with a walker.  Follow-up in 4 weeks for clinical recheck with new radiographs.  Follow-Up Instructions: No follow-ups on file.   Orders:  Orders Placed This Encounter  Procedures  . XR FEMUR MIN 2 VIEWS LEFT   Meds ordered this encounter  Medications  . methocarbamol (ROBAXIN) 500 MG tablet    Sig: 1 po q 8 hrs prn    Dispense:  30 tablet    Refill:  0    Imaging: No results found.  PMFS History: Patient Active Problem List   Diagnosis Date Noted  . Acute blood loss  anemia   . Essential hypertension   . Postoperative pain   . Femur fracture, left (HCC) 10/03/2020  . Lt Hip Hip fracture -s/p Mechanical Fall 09/27/2020  . COPD (chronic obstructive pulmonary disease) (HCC) 09/27/2020  . HTN (hypertension) 09/27/2020  . Irritable bowel syndrome (IBS) 10/04/2019  . CAP (community acquired pneumonia) 05/06/2018  . Hypersomnolence 05/06/2018  . Acute renal failure (ARF) (HCC) 05/06/2018  . Dehydration 05/06/2018  . Hypotension 05/06/2018  . Current chronic use of systemic steroids 07/04/2015  . Nontoxic multinodular goiter 05/01/2014  . Easy bruisability 08/23/2013  . Occlusion and stenosis of carotid artery without mention of cerebral infarction 03/21/2012  . Hypothyroidism, postsurgical 02/02/2012  . Preoperative clearance   . CAD (coronary artery disease)/Stent to LAD, 2003 [facility]   . Palpitations   . Ejection fraction   . MR (mitral regurgitation)   . Left ventricular hypertrophy   . Mild Aortic valve sclerosis   . Carotid arterial disease (HCC)   . GERD (gastroesophageal reflux disease)   . Hypothyroidism   . ARDS (adult respiratory distress syndrome) (HCC)   . Hemolytic anemia (HCC)   . Diastolic dysfunction   . Shortness of breath   . Back pain   . Hyperlipidemia 09/10/2009  . GERD 09/10/2009   Past Medical History:  Diagnosis Date  . Aortic valve sclerosis    Mild, echo, April, 2012  . ARDS (adult respiratory distress syndrome) (HCC) 2003   with tracheostomy-peritonitis  . Arthritis   . Back pain    Need for surgery, Dr. Trey Sailors  . CAD (coronary artery disease)    Stent to LAD, 2003 [facility] / nuclear scan April, 201 to artifact / persistent shortness of breath / catheterization Oct 21, 2010... widely patent stent to the mid LAD, no significant obstructive disease, should be low risk    for back surgery  . Carotid arterial disease (HCC)    Moderate-Dr. Myra Gianotti following  . Chronic back pain   . COPD (chronic obstructive pulmonary  disease) (HCC)   . Diastolic dysfunction    Echo, April, 2011   EF 65%  . Dysrhythmia    afib  . Ejection fraction    EF 65%, echo, September 15, 2010  . GERD (gastroesophageal reflux disease)   . Hemolytic anemia (HCC)    Followed by Dr. Derald Macleod  . Hypertension   . Hypothyroidism   . Left ventricular hypertrophy    EF 65%, echo, April, 2011  . MR (mitral regurgitation)     Mild echo, 2012 ///  Mild per Echo 2009,  mild, echo, April, 2011  . Myocardial infarction (HCC)   . Palpitations    Holter, September 16, 2010, PACs and PVCs,one 4 beat run of SVT  . Pneumonia 11-2011  . Preoperative clearance    Preop clearance for back surgery  . Shortness of breath    March, 2012  . UTI (urinary tract infection)    Chronic-on Macrodantin    Family History  Problem Relation Age of Onset  . Heart failure Father   . Diabetes Son     Past Surgical History:  Procedure Laterality Date  . APPENDECTOMY    . BACK SURGERY    . BREAST LUMPECTOMY Left   . CATARACT EXTRACTION W/PHACO Right 12/05/2012   Procedure: CATARACT EXTRACTION PHACO AND INTRAOCULAR LENS PLACEMENT (IOC);  Surgeon: Gemma Payor, MD;  Location: [facility];  Service: Ophthalmology;  Laterality: Right;  CDE:15.30  . CATARACT EXTRACTION W/PHACO Left 12/15/2012   Procedure: CATARACT EXTRACTION PHACO AND INTRAOCULAR LENS PLACEMENT (IOC);  Surgeon: Gemma Payor, MD;  Location: [facility];  Service: Ophthalmology;  Laterality: Left;  CDE:22.56  . CORONARY ANGIOPLASTY WITH STENT PLACEMENT  2003  . INTRAMEDULLARY (IM) NAIL INTERTROCHANTERIC Left 09/27/2020   Procedure: INTRAMEDULLARY (IM) NAIL INTERTROCHANTRIC;  Surgeon: Cammy Copa, MD;  Location: [facility] OR;  Service: Orthopedics;  Laterality: Left;  left trochanter  . repair of ruptured lumbar discs  12-2011  . SPINE SURGERY     lumbar disk removal by Dr. Laurian Brim at [facility]  . THYROID SURGERY  08/2009   Benign adenomas  . TONSILLECTOMY    . TRACHEOSTOMY  2003   Following ARDS    Social  History   Occupational History  . Occupation: Retired  Tobacco Use  . Smoking status: Former Smoker    Packs/day: 1.00    Years: 20.00    Pack years: 20.00    Types: Cigarettes    Quit date: 06/15/2000    Years since quitting: 20.3  . Smokeless tobacco: Never Used  Vaping Use  . Vaping Use: Never used  Substance and Sexual Activity  . Alcohol use: No  . Drug use: No  . Sexual activity: Not on file

## 2020-10-26 ENCOUNTER — Encounter: Payer: Self-pay | Admitting: Orthopedic Surgery

## 2020-10-28 DIAGNOSIS — Z466 Encounter for fitting and adjustment of urinary device: Secondary | ICD-10-CM | POA: Diagnosis not present

## 2020-10-28 DIAGNOSIS — N320 Bladder-neck obstruction: Secondary | ICD-10-CM | POA: Diagnosis not present

## 2020-10-28 DIAGNOSIS — M810 Age-related osteoporosis without current pathological fracture: Secondary | ICD-10-CM | POA: Diagnosis not present

## 2020-10-28 DIAGNOSIS — R339 Retention of urine, unspecified: Secondary | ICD-10-CM | POA: Diagnosis not present

## 2020-10-28 DIAGNOSIS — N323 Diverticulum of bladder: Secondary | ICD-10-CM | POA: Diagnosis not present

## 2020-10-28 DIAGNOSIS — S72002D Fracture of unspecified part of neck of left femur, subsequent encounter for closed fracture with routine healing: Secondary | ICD-10-CM | POA: Diagnosis not present

## 2020-10-29 DIAGNOSIS — N323 Diverticulum of bladder: Secondary | ICD-10-CM | POA: Diagnosis not present

## 2020-10-29 DIAGNOSIS — N320 Bladder-neck obstruction: Secondary | ICD-10-CM | POA: Diagnosis not present

## 2020-10-29 DIAGNOSIS — M810 Age-related osteoporosis without current pathological fracture: Secondary | ICD-10-CM | POA: Diagnosis not present

## 2020-10-29 DIAGNOSIS — Z466 Encounter for fitting and adjustment of urinary device: Secondary | ICD-10-CM | POA: Diagnosis not present

## 2020-10-29 DIAGNOSIS — R339 Retention of urine, unspecified: Secondary | ICD-10-CM | POA: Diagnosis not present

## 2020-10-29 DIAGNOSIS — S72002D Fracture of unspecified part of neck of left femur, subsequent encounter for closed fracture with routine healing: Secondary | ICD-10-CM | POA: Diagnosis not present

## 2020-10-30 DIAGNOSIS — M810 Age-related osteoporosis without current pathological fracture: Secondary | ICD-10-CM | POA: Diagnosis not present

## 2020-10-30 DIAGNOSIS — R339 Retention of urine, unspecified: Secondary | ICD-10-CM | POA: Diagnosis not present

## 2020-10-30 DIAGNOSIS — S72002D Fracture of unspecified part of neck of left femur, subsequent encounter for closed fracture with routine healing: Secondary | ICD-10-CM | POA: Diagnosis not present

## 2020-10-30 DIAGNOSIS — N323 Diverticulum of bladder: Secondary | ICD-10-CM | POA: Diagnosis not present

## 2020-10-30 DIAGNOSIS — N320 Bladder-neck obstruction: Secondary | ICD-10-CM | POA: Diagnosis not present

## 2020-10-30 DIAGNOSIS — Z466 Encounter for fitting and adjustment of urinary device: Secondary | ICD-10-CM | POA: Diagnosis not present

## 2020-10-31 DIAGNOSIS — N323 Diverticulum of bladder: Secondary | ICD-10-CM | POA: Diagnosis not present

## 2020-10-31 DIAGNOSIS — Z466 Encounter for fitting and adjustment of urinary device: Secondary | ICD-10-CM | POA: Diagnosis not present

## 2020-10-31 DIAGNOSIS — R339 Retention of urine, unspecified: Secondary | ICD-10-CM | POA: Diagnosis not present

## 2020-10-31 DIAGNOSIS — M810 Age-related osteoporosis without current pathological fracture: Secondary | ICD-10-CM | POA: Diagnosis not present

## 2020-10-31 DIAGNOSIS — S72002D Fracture of unspecified part of neck of left femur, subsequent encounter for closed fracture with routine healing: Secondary | ICD-10-CM | POA: Diagnosis not present

## 2020-10-31 DIAGNOSIS — N320 Bladder-neck obstruction: Secondary | ICD-10-CM | POA: Diagnosis not present

## 2020-11-01 DIAGNOSIS — R339 Retention of urine, unspecified: Secondary | ICD-10-CM | POA: Diagnosis not present

## 2020-11-01 DIAGNOSIS — S72002D Fracture of unspecified part of neck of left femur, subsequent encounter for closed fracture with routine healing: Secondary | ICD-10-CM | POA: Diagnosis not present

## 2020-11-01 DIAGNOSIS — M810 Age-related osteoporosis without current pathological fracture: Secondary | ICD-10-CM | POA: Diagnosis not present

## 2020-11-01 DIAGNOSIS — N323 Diverticulum of bladder: Secondary | ICD-10-CM | POA: Diagnosis not present

## 2020-11-01 DIAGNOSIS — N320 Bladder-neck obstruction: Secondary | ICD-10-CM | POA: Diagnosis not present

## 2020-11-01 DIAGNOSIS — Z466 Encounter for fitting and adjustment of urinary device: Secondary | ICD-10-CM | POA: Diagnosis not present

## 2020-11-03 ENCOUNTER — Other Ambulatory Visit: Payer: Self-pay | Admitting: Physical Medicine and Rehabilitation

## 2020-11-04 DIAGNOSIS — M810 Age-related osteoporosis without current pathological fracture: Secondary | ICD-10-CM | POA: Diagnosis not present

## 2020-11-04 DIAGNOSIS — R339 Retention of urine, unspecified: Secondary | ICD-10-CM | POA: Diagnosis not present

## 2020-11-04 DIAGNOSIS — Z466 Encounter for fitting and adjustment of urinary device: Secondary | ICD-10-CM | POA: Diagnosis not present

## 2020-11-04 DIAGNOSIS — N323 Diverticulum of bladder: Secondary | ICD-10-CM | POA: Diagnosis not present

## 2020-11-04 DIAGNOSIS — N320 Bladder-neck obstruction: Secondary | ICD-10-CM | POA: Diagnosis not present

## 2020-11-04 DIAGNOSIS — S72002D Fracture of unspecified part of neck of left femur, subsequent encounter for closed fracture with routine healing: Secondary | ICD-10-CM | POA: Diagnosis not present

## 2020-11-08 DIAGNOSIS — S72002D Fracture of unspecified part of neck of left femur, subsequent encounter for closed fracture with routine healing: Secondary | ICD-10-CM | POA: Diagnosis not present

## 2020-11-08 DIAGNOSIS — N320 Bladder-neck obstruction: Secondary | ICD-10-CM | POA: Diagnosis not present

## 2020-11-08 DIAGNOSIS — M810 Age-related osteoporosis without current pathological fracture: Secondary | ICD-10-CM | POA: Diagnosis not present

## 2020-11-08 DIAGNOSIS — N323 Diverticulum of bladder: Secondary | ICD-10-CM | POA: Diagnosis not present

## 2020-11-08 DIAGNOSIS — R339 Retention of urine, unspecified: Secondary | ICD-10-CM | POA: Diagnosis not present

## 2020-11-08 DIAGNOSIS — Z466 Encounter for fitting and adjustment of urinary device: Secondary | ICD-10-CM | POA: Diagnosis not present

## 2020-11-10 DIAGNOSIS — S7292XA Unspecified fracture of left femur, initial encounter for closed fracture: Secondary | ICD-10-CM | POA: Diagnosis not present

## 2020-11-10 DIAGNOSIS — D62 Acute posthemorrhagic anemia: Secondary | ICD-10-CM | POA: Diagnosis not present

## 2020-11-10 DIAGNOSIS — E039 Hypothyroidism, unspecified: Secondary | ICD-10-CM | POA: Diagnosis not present

## 2020-11-11 ENCOUNTER — Other Ambulatory Visit: Payer: Self-pay | Admitting: Physical Medicine and Rehabilitation

## 2020-11-11 DIAGNOSIS — I872 Venous insufficiency (chronic) (peripheral): Secondary | ICD-10-CM | POA: Diagnosis not present

## 2020-11-11 DIAGNOSIS — Z87891 Personal history of nicotine dependence: Secondary | ICD-10-CM | POA: Diagnosis not present

## 2020-11-11 DIAGNOSIS — I10 Essential (primary) hypertension: Secondary | ICD-10-CM | POA: Diagnosis not present

## 2020-11-11 DIAGNOSIS — J441 Chronic obstructive pulmonary disease with (acute) exacerbation: Secondary | ICD-10-CM | POA: Diagnosis not present

## 2020-11-12 DIAGNOSIS — M810 Age-related osteoporosis without current pathological fracture: Secondary | ICD-10-CM | POA: Diagnosis not present

## 2020-11-12 DIAGNOSIS — Z466 Encounter for fitting and adjustment of urinary device: Secondary | ICD-10-CM | POA: Diagnosis not present

## 2020-11-12 DIAGNOSIS — S72002D Fracture of unspecified part of neck of left femur, subsequent encounter for closed fracture with routine healing: Secondary | ICD-10-CM | POA: Diagnosis not present

## 2020-11-12 DIAGNOSIS — R339 Retention of urine, unspecified: Secondary | ICD-10-CM | POA: Diagnosis not present

## 2020-11-12 DIAGNOSIS — N320 Bladder-neck obstruction: Secondary | ICD-10-CM | POA: Diagnosis not present

## 2020-11-12 DIAGNOSIS — N323 Diverticulum of bladder: Secondary | ICD-10-CM | POA: Diagnosis not present

## 2020-11-13 DIAGNOSIS — M810 Age-related osteoporosis without current pathological fracture: Secondary | ICD-10-CM | POA: Diagnosis not present

## 2020-11-13 DIAGNOSIS — N320 Bladder-neck obstruction: Secondary | ICD-10-CM | POA: Diagnosis not present

## 2020-11-13 DIAGNOSIS — Z466 Encounter for fitting and adjustment of urinary device: Secondary | ICD-10-CM | POA: Diagnosis not present

## 2020-11-13 DIAGNOSIS — S72002D Fracture of unspecified part of neck of left femur, subsequent encounter for closed fracture with routine healing: Secondary | ICD-10-CM | POA: Diagnosis not present

## 2020-11-13 DIAGNOSIS — N323 Diverticulum of bladder: Secondary | ICD-10-CM | POA: Diagnosis not present

## 2020-11-13 DIAGNOSIS — R339 Retention of urine, unspecified: Secondary | ICD-10-CM | POA: Diagnosis not present

## 2020-11-18 DIAGNOSIS — Z466 Encounter for fitting and adjustment of urinary device: Secondary | ICD-10-CM | POA: Diagnosis not present

## 2020-11-18 DIAGNOSIS — N320 Bladder-neck obstruction: Secondary | ICD-10-CM | POA: Diagnosis not present

## 2020-11-18 DIAGNOSIS — N323 Diverticulum of bladder: Secondary | ICD-10-CM | POA: Diagnosis not present

## 2020-11-18 DIAGNOSIS — S72002D Fracture of unspecified part of neck of left femur, subsequent encounter for closed fracture with routine healing: Secondary | ICD-10-CM | POA: Diagnosis not present

## 2020-11-18 DIAGNOSIS — M810 Age-related osteoporosis without current pathological fracture: Secondary | ICD-10-CM | POA: Diagnosis not present

## 2020-11-18 DIAGNOSIS — R339 Retention of urine, unspecified: Secondary | ICD-10-CM | POA: Diagnosis not present

## 2020-11-20 ENCOUNTER — Ambulatory Visit (INDEPENDENT_AMBULATORY_CARE_PROVIDER_SITE_OTHER): Payer: Medicare Other | Admitting: Orthopedic Surgery

## 2020-11-20 ENCOUNTER — Other Ambulatory Visit: Payer: Self-pay

## 2020-11-20 ENCOUNTER — Other Ambulatory Visit: Payer: Self-pay | Admitting: Physical Medicine and Rehabilitation

## 2020-11-20 ENCOUNTER — Ambulatory Visit: Payer: Self-pay

## 2020-11-20 DIAGNOSIS — G8918 Other acute postprocedural pain: Secondary | ICD-10-CM

## 2020-11-22 ENCOUNTER — Encounter: Payer: Self-pay | Admitting: Orthopedic Surgery

## 2020-11-22 DIAGNOSIS — R339 Retention of urine, unspecified: Secondary | ICD-10-CM | POA: Diagnosis not present

## 2020-11-22 DIAGNOSIS — N320 Bladder-neck obstruction: Secondary | ICD-10-CM | POA: Diagnosis not present

## 2020-11-22 DIAGNOSIS — S72002D Fracture of unspecified part of neck of left femur, subsequent encounter for closed fracture with routine healing: Secondary | ICD-10-CM | POA: Diagnosis not present

## 2020-11-22 DIAGNOSIS — N323 Diverticulum of bladder: Secondary | ICD-10-CM | POA: Diagnosis not present

## 2020-11-22 DIAGNOSIS — Z9981 Dependence on supplemental oxygen: Secondary | ICD-10-CM | POA: Diagnosis not present

## 2020-11-22 DIAGNOSIS — M810 Age-related osteoporosis without current pathological fracture: Secondary | ICD-10-CM | POA: Diagnosis not present

## 2020-11-22 DIAGNOSIS — M5416 Radiculopathy, lumbar region: Secondary | ICD-10-CM | POA: Diagnosis not present

## 2020-11-22 DIAGNOSIS — Z466 Encounter for fitting and adjustment of urinary device: Secondary | ICD-10-CM | POA: Diagnosis not present

## 2020-11-22 DIAGNOSIS — I252 Old myocardial infarction: Secondary | ICD-10-CM | POA: Diagnosis not present

## 2020-11-22 NOTE — Progress Notes (Signed)
Post-Op Visit Note   Patient: Mckenzie [name]           Date of Birth: November 29, 1935           MRN: [id number] Visit Date: 11/20/2020 PCP: Royann Shivers, PA-C   Assessment & Plan:  Chief Complaint:  Chief Complaint  Patient presents with   Left Leg - Pain   Visit Diagnoses:  1. Postoperative pain     Plan: Chrysa is a patient underwent left inotrope nail for femur fracture 09/27/2020.  She reports an increase in pain but denies any fevers or chills.  Reports pain in the front of her thigh.  On exam she has pretty good hip flexion and abduction strength.  No pain with axial compression.  Radiographs show increased callus formation and no evidence of distal interlock screw failure lucencies or screw cut out proximally.  Plan at this time is 8-week return with repeat radiographs.  No groin pain at this time with internal or external rotation.  Could be coming from her back but she does not have any nerve root tension signs or paresthesias.  Follow-Up Instructions: No follow-ups on file.   Orders:  Orders Placed This Encounter  Procedures   XR FEMUR MIN 2 VIEWS LEFT   No orders of the defined types were placed in this encounter.   Imaging: No results found.  PMFS History: Patient Active Problem List   Diagnosis Date Noted   Acute blood loss anemia    Essential hypertension    Postoperative pain    Femur fracture, left (HCC) 10/03/2020   Lt Hip Hip fracture -s/p Mechanical Fall 09/27/2020   COPD (chronic obstructive pulmonary disease) (HCC) 09/27/2020   HTN (hypertension) 09/27/2020   Irritable bowel syndrome (IBS) 10/04/2019   CAP (community acquired pneumonia) 05/06/2018   Hypersomnolence 05/06/2018   Acute renal failure (ARF) (HCC) 05/06/2018   Dehydration 05/06/2018   Hypotension 05/06/2018   Current chronic use of systemic steroids 07/04/2015   Nontoxic multinodular goiter 05/01/2014   Easy bruisability 08/23/2013   Occlusion and stenosis of carotid  artery without mention of cerebral infarction 03/21/2012   Hypothyroidism, postsurgical 02/02/2012   Preoperative clearance    CAD (coronary artery disease)/Stent to LAD, 2003 [facility]    Palpitations    Ejection fraction    MR (mitral regurgitation)    Left ventricular hypertrophy    Mild Aortic valve sclerosis    Carotid arterial disease (HCC)    GERD (gastroesophageal reflux disease)    Hypothyroidism    ARDS (adult respiratory distress syndrome) (HCC)    Hemolytic anemia (HCC)    Diastolic dysfunction    Shortness of breath    Back pain    Hyperlipidemia 09/10/2009   GERD 09/10/2009   Past Medical History:  Diagnosis Date   Aortic valve sclerosis    Mild, echo, April, 2012   ARDS (adult respiratory distress syndrome) (HCC) 2003   with tracheostomy-peritonitis   Arthritis    Back pain    Need for surgery, Dr. Trey Sailors   CAD (coronary artery disease)    Stent to LAD, 2003 [facility] / nuclear scan April, 201 to artifact / persistent shortness of breath / catheterization Oct 21, 2010... widely patent stent to the mid LAD, no significant obstructive disease, should be low risk    for back surgery   Carotid arterial disease (HCC)    Moderate-Dr. Myra Gianotti following   Chronic back pain    COPD (chronic obstructive pulmonary disease) (HCC)  Diastolic dysfunction    Echo, April, 2011   EF 65%   Dysrhythmia    afib   Ejection fraction    EF 65%, echo, September 15, 2010   GERD (gastroesophageal reflux disease)    Hemolytic anemia (HCC)    Followed by Dr. Derald Macleod   Hypertension    Hypothyroidism    Left ventricular hypertrophy    EF 65%, echo, April, 2011   MR (mitral regurgitation)     Mild echo, 2012 ///  Mild per Echo 2009,  mild, echo, April, 2011   Myocardial infarction [facility])    Palpitations    Holter, September 16, 2010, PACs and PVCs,one 4 beat run of SVT   Pneumonia 11-2011   Preoperative clearance    Preop clearance for back surgery   Shortness of breath    March, 2012   UTI  (urinary tract infection)    Chronic-on Macrodantin    Family History  Problem Relation Age of Onset   Heart failure Father    Diabetes Son     Past Surgical History:  Procedure Laterality Date   APPENDECTOMY     BACK SURGERY     BREAST LUMPECTOMY Left    CATARACT EXTRACTION W/PHACO Right 12/05/2012   Procedure: CATARACT EXTRACTION PHACO AND INTRAOCULAR LENS PLACEMENT (IOC);  Surgeon: Gemma Payor, MD;  Location: [facility];  Service: Ophthalmology;  Laterality: Right;  CDE:15.30   CATARACT EXTRACTION W/PHACO Left 12/15/2012   Procedure: CATARACT EXTRACTION PHACO AND INTRAOCULAR LENS PLACEMENT (IOC);  Surgeon: Gemma Payor, MD;  Location: [facility];  Service: Ophthalmology;  Laterality: Left;  CDE:22.56   CORONARY ANGIOPLASTY WITH STENT PLACEMENT  2003   INTRAMEDULLARY (IM) NAIL INTERTROCHANTERIC Left 09/27/2020   Procedure: INTRAMEDULLARY (IM) NAIL INTERTROCHANTRIC;  Surgeon: Cammy Copa, MD;  Location: [facility] OR;  Service: Orthopedics;  Laterality: Left;  left trochanter   repair of ruptured lumbar discs  12-2011   SPINE SURGERY     lumbar disk removal by Dr. Laurian Brim at [facility]   THYROID SURGERY  08/2009   Benign adenomas   TONSILLECTOMY     TRACHEOSTOMY  2003   Following ARDS    Social History   Occupational History   Occupation: Retired  Tobacco Use   Smoking status: Former    Packs/day: 1.00    Years: 20.00    Pack years: 20.00    Types: Cigarettes    Quit date: 06/15/2000    Years since quitting: 20.4   Smokeless tobacco: Never  Vaping Use   Vaping Use: Never used  Substance and Sexual Activity   Alcohol use: No   Drug use: No   Sexual activity: Not on file

## 2020-11-25 ENCOUNTER — Encounter: Payer: Medicare Other | Admitting: Orthopedic Surgery

## 2020-11-28 DIAGNOSIS — Z466 Encounter for fitting and adjustment of urinary device: Secondary | ICD-10-CM | POA: Diagnosis not present

## 2020-11-28 DIAGNOSIS — N320 Bladder-neck obstruction: Secondary | ICD-10-CM | POA: Diagnosis not present

## 2020-11-28 DIAGNOSIS — N323 Diverticulum of bladder: Secondary | ICD-10-CM | POA: Diagnosis not present

## 2020-11-28 DIAGNOSIS — S72002D Fracture of unspecified part of neck of left femur, subsequent encounter for closed fracture with routine healing: Secondary | ICD-10-CM | POA: Diagnosis not present

## 2020-11-28 DIAGNOSIS — M810 Age-related osteoporosis without current pathological fracture: Secondary | ICD-10-CM | POA: Diagnosis not present

## 2020-11-28 DIAGNOSIS — R339 Retention of urine, unspecified: Secondary | ICD-10-CM | POA: Diagnosis not present

## 2020-12-05 DIAGNOSIS — N320 Bladder-neck obstruction: Secondary | ICD-10-CM | POA: Diagnosis not present

## 2020-12-05 DIAGNOSIS — M810 Age-related osteoporosis without current pathological fracture: Secondary | ICD-10-CM | POA: Diagnosis not present

## 2020-12-05 DIAGNOSIS — R339 Retention of urine, unspecified: Secondary | ICD-10-CM | POA: Diagnosis not present

## 2020-12-05 DIAGNOSIS — Z466 Encounter for fitting and adjustment of urinary device: Secondary | ICD-10-CM | POA: Diagnosis not present

## 2020-12-05 DIAGNOSIS — S72002D Fracture of unspecified part of neck of left femur, subsequent encounter for closed fracture with routine healing: Secondary | ICD-10-CM | POA: Diagnosis not present

## 2020-12-05 DIAGNOSIS — N323 Diverticulum of bladder: Secondary | ICD-10-CM | POA: Diagnosis not present

## 2020-12-09 DIAGNOSIS — Z79891 Long term (current) use of opiate analgesic: Secondary | ICD-10-CM | POA: Diagnosis not present

## 2020-12-09 DIAGNOSIS — G894 Chronic pain syndrome: Secondary | ICD-10-CM | POA: Diagnosis not present

## 2020-12-09 DIAGNOSIS — Z79899 Other long term (current) drug therapy: Secondary | ICD-10-CM | POA: Diagnosis not present

## 2020-12-09 DIAGNOSIS — M5030 Other cervical disc degeneration, unspecified cervical region: Secondary | ICD-10-CM | POA: Diagnosis not present

## 2020-12-09 DIAGNOSIS — M1612 Unilateral primary osteoarthritis, left hip: Secondary | ICD-10-CM | POA: Diagnosis not present

## 2020-12-09 DIAGNOSIS — M79605 Pain in left leg: Secondary | ICD-10-CM | POA: Diagnosis not present

## 2020-12-11 DIAGNOSIS — R339 Retention of urine, unspecified: Secondary | ICD-10-CM | POA: Diagnosis not present

## 2020-12-11 DIAGNOSIS — N323 Diverticulum of bladder: Secondary | ICD-10-CM | POA: Diagnosis not present

## 2020-12-11 DIAGNOSIS — Z466 Encounter for fitting and adjustment of urinary device: Secondary | ICD-10-CM | POA: Diagnosis not present

## 2020-12-11 DIAGNOSIS — S72002D Fracture of unspecified part of neck of left femur, subsequent encounter for closed fracture with routine healing: Secondary | ICD-10-CM | POA: Diagnosis not present

## 2020-12-11 DIAGNOSIS — M810 Age-related osteoporosis without current pathological fracture: Secondary | ICD-10-CM | POA: Diagnosis not present

## 2020-12-11 DIAGNOSIS — N320 Bladder-neck obstruction: Secondary | ICD-10-CM | POA: Diagnosis not present

## 2020-12-12 DIAGNOSIS — I10 Essential (primary) hypertension: Secondary | ICD-10-CM | POA: Diagnosis not present

## 2020-12-12 DIAGNOSIS — J441 Chronic obstructive pulmonary disease with (acute) exacerbation: Secondary | ICD-10-CM | POA: Diagnosis not present

## 2020-12-12 DIAGNOSIS — Z87891 Personal history of nicotine dependence: Secondary | ICD-10-CM | POA: Diagnosis not present

## 2020-12-12 DIAGNOSIS — I872 Venous insufficiency (chronic) (peripheral): Secondary | ICD-10-CM | POA: Diagnosis not present

## 2020-12-19 DIAGNOSIS — I350 Nonrheumatic aortic (valve) stenosis: Secondary | ICD-10-CM | POA: Insufficient documentation

## 2020-12-19 DIAGNOSIS — I2510 Atherosclerotic heart disease of native coronary artery without angina pectoris: Secondary | ICD-10-CM | POA: Diagnosis not present

## 2020-12-19 DIAGNOSIS — S72002D Fracture of unspecified part of neck of left femur, subsequent encounter for closed fracture with routine healing: Secondary | ICD-10-CM | POA: Diagnosis not present

## 2020-12-19 DIAGNOSIS — M810 Age-related osteoporosis without current pathological fracture: Secondary | ICD-10-CM | POA: Diagnosis not present

## 2020-12-19 DIAGNOSIS — N323 Diverticulum of bladder: Secondary | ICD-10-CM | POA: Diagnosis not present

## 2020-12-19 DIAGNOSIS — E785 Hyperlipidemia, unspecified: Secondary | ICD-10-CM | POA: Diagnosis not present

## 2020-12-19 DIAGNOSIS — Z466 Encounter for fitting and adjustment of urinary device: Secondary | ICD-10-CM | POA: Diagnosis not present

## 2020-12-19 DIAGNOSIS — R339 Retention of urine, unspecified: Secondary | ICD-10-CM | POA: Diagnosis not present

## 2020-12-19 DIAGNOSIS — N320 Bladder-neck obstruction: Secondary | ICD-10-CM | POA: Diagnosis not present

## 2020-12-19 DIAGNOSIS — I10 Essential (primary) hypertension: Secondary | ICD-10-CM | POA: Diagnosis not present

## 2020-12-22 DIAGNOSIS — N323 Diverticulum of bladder: Secondary | ICD-10-CM | POA: Diagnosis not present

## 2020-12-22 DIAGNOSIS — R339 Retention of urine, unspecified: Secondary | ICD-10-CM | POA: Diagnosis not present

## 2020-12-22 DIAGNOSIS — M5416 Radiculopathy, lumbar region: Secondary | ICD-10-CM | POA: Diagnosis not present

## 2020-12-22 DIAGNOSIS — S72002D Fracture of unspecified part of neck of left femur, subsequent encounter for closed fracture with routine healing: Secondary | ICD-10-CM | POA: Diagnosis not present

## 2020-12-22 DIAGNOSIS — Z9981 Dependence on supplemental oxygen: Secondary | ICD-10-CM | POA: Diagnosis not present

## 2020-12-22 DIAGNOSIS — N320 Bladder-neck obstruction: Secondary | ICD-10-CM | POA: Diagnosis not present

## 2020-12-22 DIAGNOSIS — I252 Old myocardial infarction: Secondary | ICD-10-CM | POA: Diagnosis not present

## 2020-12-22 DIAGNOSIS — Z466 Encounter for fitting and adjustment of urinary device: Secondary | ICD-10-CM | POA: Diagnosis not present

## 2020-12-22 DIAGNOSIS — M810 Age-related osteoporosis without current pathological fracture: Secondary | ICD-10-CM | POA: Diagnosis not present

## 2020-12-27 DIAGNOSIS — M810 Age-related osteoporosis without current pathological fracture: Secondary | ICD-10-CM | POA: Diagnosis not present

## 2020-12-27 DIAGNOSIS — S72002D Fracture of unspecified part of neck of left femur, subsequent encounter for closed fracture with routine healing: Secondary | ICD-10-CM | POA: Diagnosis not present

## 2020-12-27 DIAGNOSIS — N323 Diverticulum of bladder: Secondary | ICD-10-CM | POA: Diagnosis not present

## 2020-12-27 DIAGNOSIS — N320 Bladder-neck obstruction: Secondary | ICD-10-CM | POA: Diagnosis not present

## 2020-12-27 DIAGNOSIS — Z466 Encounter for fitting and adjustment of urinary device: Secondary | ICD-10-CM | POA: Diagnosis not present

## 2020-12-27 DIAGNOSIS — R339 Retention of urine, unspecified: Secondary | ICD-10-CM | POA: Diagnosis not present

## 2020-12-31 DIAGNOSIS — Z20822 Contact with and (suspected) exposure to covid-19: Secondary | ICD-10-CM | POA: Diagnosis not present

## 2021-01-02 ENCOUNTER — Inpatient Hospital Stay: Payer: Medicare Other | Admitting: Physical Medicine and Rehabilitation

## 2021-01-09 DIAGNOSIS — N320 Bladder-neck obstruction: Secondary | ICD-10-CM | POA: Diagnosis not present

## 2021-01-09 DIAGNOSIS — S72002D Fracture of unspecified part of neck of left femur, subsequent encounter for closed fracture with routine healing: Secondary | ICD-10-CM | POA: Diagnosis not present

## 2021-01-09 DIAGNOSIS — Z466 Encounter for fitting and adjustment of urinary device: Secondary | ICD-10-CM | POA: Diagnosis not present

## 2021-01-09 DIAGNOSIS — N323 Diverticulum of bladder: Secondary | ICD-10-CM | POA: Diagnosis not present

## 2021-01-09 DIAGNOSIS — M810 Age-related osteoporosis without current pathological fracture: Secondary | ICD-10-CM | POA: Diagnosis not present

## 2021-01-09 DIAGNOSIS — R339 Retention of urine, unspecified: Secondary | ICD-10-CM | POA: Diagnosis not present

## 2021-01-12 DIAGNOSIS — Z87891 Personal history of nicotine dependence: Secondary | ICD-10-CM | POA: Diagnosis not present

## 2021-01-12 DIAGNOSIS — J441 Chronic obstructive pulmonary disease with (acute) exacerbation: Secondary | ICD-10-CM | POA: Diagnosis not present

## 2021-01-12 DIAGNOSIS — I10 Essential (primary) hypertension: Secondary | ICD-10-CM | POA: Diagnosis not present

## 2021-01-12 DIAGNOSIS — I872 Venous insufficiency (chronic) (peripheral): Secondary | ICD-10-CM | POA: Diagnosis not present

## 2021-01-20 ENCOUNTER — Ambulatory Visit (INDEPENDENT_AMBULATORY_CARE_PROVIDER_SITE_OTHER): Payer: Medicare Other

## 2021-01-20 ENCOUNTER — Ambulatory Visit (INDEPENDENT_AMBULATORY_CARE_PROVIDER_SITE_OTHER): Payer: Medicare Other | Admitting: Orthopedic Surgery

## 2021-01-20 ENCOUNTER — Other Ambulatory Visit: Payer: Self-pay

## 2021-01-20 DIAGNOSIS — M79605 Pain in left leg: Secondary | ICD-10-CM | POA: Diagnosis not present

## 2021-01-20 MED ORDER — TRAMADOL HCL 50 MG PO TABS: ORAL_TABLET | ORAL | 0 refills | Status: DC

## 2021-01-21 DIAGNOSIS — Z9981 Dependence on supplemental oxygen: Secondary | ICD-10-CM | POA: Diagnosis not present

## 2021-01-21 DIAGNOSIS — M7060 Trochanteric bursitis, unspecified hip: Secondary | ICD-10-CM | POA: Diagnosis not present

## 2021-01-21 DIAGNOSIS — N323 Diverticulum of bladder: Secondary | ICD-10-CM | POA: Diagnosis not present

## 2021-01-21 DIAGNOSIS — R339 Retention of urine, unspecified: Secondary | ICD-10-CM | POA: Diagnosis not present

## 2021-01-21 DIAGNOSIS — Z466 Encounter for fitting and adjustment of urinary device: Secondary | ICD-10-CM | POA: Diagnosis not present

## 2021-01-21 DIAGNOSIS — M5416 Radiculopathy, lumbar region: Secondary | ICD-10-CM | POA: Diagnosis not present

## 2021-01-21 DIAGNOSIS — N320 Bladder-neck obstruction: Secondary | ICD-10-CM | POA: Diagnosis not present

## 2021-01-21 DIAGNOSIS — S72002D Fracture of unspecified part of neck of left femur, subsequent encounter for closed fracture with routine healing: Secondary | ICD-10-CM | POA: Diagnosis not present

## 2021-01-21 DIAGNOSIS — M810 Age-related osteoporosis without current pathological fracture: Secondary | ICD-10-CM | POA: Diagnosis not present

## 2021-01-21 DIAGNOSIS — I252 Old myocardial infarction: Secondary | ICD-10-CM | POA: Diagnosis not present

## 2021-01-22 ENCOUNTER — Telehealth: Payer: Self-pay | Admitting: Orthopedic Surgery

## 2021-01-22 NOTE — Telephone Encounter (Signed)
Patient called. She would like Dr. August Saucer to know that she will not be coming back because she is seeing a pain doctor. Her call back number is [phone]

## 2021-01-22 NOTE — Telephone Encounter (Signed)
ok 

## 2021-01-27 ENCOUNTER — Encounter: Payer: Self-pay | Admitting: Orthopedic Surgery

## 2021-01-27 NOTE — Progress Notes (Signed)
Office Visit Note   Patient: Mckenzie [name]           Date of Birth: May 03, 1936           MRN: [id number] Visit Date: 01/20/2021 Requested by: Mckenzie Shivers, PA-C [address] [place],  [place] [address] PCP: Mckenzie [name]  Subjective: Chief Complaint  Patient presents with   Left Leg - Follow-up    HPI: Mckenzie [name] is an 85 year old female underwent left IM nail for femur fracture 09/27/2020.  She is ambulating with a walker.  She reports a constant dull ache.  Denies any fevers or chills.              ROS: All systems reviewed are negative as they relate to the chief complaint within the history of present illness.  Patient denies  fevers or chills.   Assessment & Plan: Visit Diagnoses:  1. Pain in left leg     Plan: Impression is left leg pain following substrate femur fracture treated with IM nail.  Radiographs show callus formation.  No distal screw breakage which would indicate nonunion or delayed union.  Plan is refill tramadol.  Continue weightbearing as tolerated.  Repeat radiographs in 4 to 6 weeks.  No evidence of healing issues at this time or hardware failings.  Follow-Up Instructions: Return if symptoms worsen or fail to improve.   Orders:  Orders Placed This Encounter  Procedures   XR FEMUR MIN 2 VIEWS LEFT   Meds ordered this encounter  Medications   traMADol (ULTRAM) 50 MG tablet    Sig: 1  po q hs prn pain    Dispense:  30 tablet    Refill:  0      Procedures: No procedures performed   Clinical Data: No additional findings.  Objective: Vital Signs: There were no vitals taken for this visit.  Physical Exam:   Constitutional: Patient appears well-developed HEENT:  Head: Normocephalic Eyes:EOM are normal Neck: Normal range of motion Cardiovascular: Normal rate Pulmonary/chest: Effort normal Neurologic: Patient is alert Skin: Skin is warm Psychiatric: Patient has normal mood and affect   Ortho Exam: Ortho exam  demonstrates good ankle dorsiflexion plantarflexion strength bilaterally.  Leg lengths approximately equal.  Minimal pain with groin internal and external rotation on the left-hand side.  Patient has 5- out of 5 hip flexion strength on the left compared to 5 out of 5 on the right.  Abduction adduction strength is pretty symmetric.  Specialty Comments:  No specialty comments available.  Imaging: No results found.   PMFS History: Patient Active Problem List   Diagnosis Date Noted   Acute blood loss anemia    Essential hypertension    Postoperative pain    Femur fracture, left (HCC) 10/03/2020   Lt Hip Hip fracture -s/p Mechanical Fall 09/27/2020   COPD (chronic obstructive pulmonary disease) (HCC) 09/27/2020   HTN (hypertension) 09/27/2020   Irritable bowel syndrome (IBS) 10/04/2019   CAP (community acquired pneumonia) 05/06/2018   Hypersomnolence 05/06/2018   Acute renal failure (ARF) (HCC) 05/06/2018   Dehydration 05/06/2018   Hypotension 05/06/2018   Current chronic use of systemic steroids 07/04/2015   Nontoxic multinodular goiter 05/01/2014   Easy bruisability 08/23/2013   Occlusion and stenosis of carotid artery without mention of cerebral infarction 03/21/2012   Hypothyroidism, postsurgical 02/02/2012   Preoperative clearance    CAD (coronary artery disease)/Stent to LAD, 2003 [facility]    Palpitations    Ejection fraction    MR (  mitral regurgitation)    Left ventricular hypertrophy    Mild Aortic valve sclerosis    Carotid arterial disease (HCC)    GERD (gastroesophageal reflux disease)    Hypothyroidism    ARDS (adult respiratory distress syndrome) (HCC)    Hemolytic anemia (HCC)    Diastolic dysfunction    Shortness of breath    Back pain    Hyperlipidemia 09/10/2009   GERD 09/10/2009   Past Medical History:  Diagnosis Date   Aortic valve sclerosis    Mild, echo, April, 2012   ARDS (adult respiratory distress syndrome) (HCC) 2003   with tracheostomy-peritonitis    Arthritis    Back pain    Need for surgery, Dr. Trey [name]   CAD (coronary artery disease)    Stent to LAD, 2003 [facility] / nuclear scan April, 201 to artifact / persistent shortness of breath / catheterization Oct 21, 2010... widely patent stent to the mid LAD, no significant obstructive disease, should be low risk    for back surgery   Carotid arterial disease (HCC)    Moderate-Mckenzie [name] following   Chronic back pain    COPD (chronic obstructive pulmonary disease) (HCC)    Diastolic dysfunction    Echo, April, 2011   EF 65%   Dysrhythmia    afib   Ejection fraction    EF 65%, echo, September 15, 2010   GERD (gastroesophageal reflux disease)    Hemolytic anemia (HCC)    Followed by Mckenzie [name]   Hypertension    Hypothyroidism    Left ventricular hypertrophy    EF 65%, echo, April, 2011   MR (mitral regurgitation)     Mild echo, 2012 ///  Mild per Echo 2009,  mild, echo, April, 2011   Myocardial infarction [facility])    Palpitations    Holter, September 16, 2010, PACs and PVCs,one 4 beat run of SVT   Pneumonia 11-2011   Preoperative clearance    Preop clearance for back surgery   Shortness of breath    March, 2012   UTI (urinary tract infection)    Chronic-on Macrodantin    Family History  Problem Relation Age of Onset   Heart failure Father    Diabetes Son     Past Surgical History:  Procedure Laterality Date   APPENDECTOMY     BACK SURGERY     BREAST LUMPECTOMY Left    CATARACT EXTRACTION W/PHACO Right 12/05/2012   Procedure: CATARACT EXTRACTION PHACO AND INTRAOCULAR LENS PLACEMENT (IOC);  Surgeon: Mckenzie Payor, MD;  Location: [facility];  Service: Ophthalmology;  Laterality: Right;  CDE:15.30   CATARACT EXTRACTION W/PHACO Left 12/15/2012   Procedure: CATARACT EXTRACTION PHACO AND INTRAOCULAR LENS PLACEMENT (IOC);  Surgeon: Mckenzie Payor, MD;  Location: [facility];  Service: Ophthalmology;  Laterality: Left;  CDE:22.56   CORONARY ANGIOPLASTY WITH STENT PLACEMENT  2003   INTRAMEDULLARY (IM) NAIL  INTERTROCHANTERIC Left 09/27/2020   Procedure: INTRAMEDULLARY (IM) NAIL INTERTROCHANTRIC;  Surgeon: Mckenzie Copa, MD;  Location: [facility] OR;  Service: Orthopedics;  Laterality: Left;  left trochanter   repair of ruptured lumbar discs  12-2011   SPINE SURGERY     lumbar disk removal by Dr. Laurian Brim at [facility]   THYROID SURGERY  08/2009   Benign adenomas   TONSILLECTOMY     TRACHEOSTOMY  2003   Following ARDS    Social History   Occupational History   Occupation: Retired  Tobacco Use   Smoking status: Former    Packs/day: 1.00  Years: 20.00    Pack years: 20.00    Types: Cigarettes    Quit date: 06/15/2000    Years since quitting: 20.6   Smokeless tobacco: Never  Vaping Use   Vaping Use: Never used  Substance and Sexual Activity   Alcohol use: No   Drug use: No   Sexual activity: Not on file

## 2021-02-12 DIAGNOSIS — J441 Chronic obstructive pulmonary disease with (acute) exacerbation: Secondary | ICD-10-CM | POA: Diagnosis not present

## 2021-02-12 DIAGNOSIS — Z87891 Personal history of nicotine dependence: Secondary | ICD-10-CM | POA: Diagnosis not present

## 2021-02-12 DIAGNOSIS — I872 Venous insufficiency (chronic) (peripheral): Secondary | ICD-10-CM | POA: Diagnosis not present

## 2021-02-12 DIAGNOSIS — I10 Essential (primary) hypertension: Secondary | ICD-10-CM | POA: Diagnosis not present

## 2021-02-13 DIAGNOSIS — N323 Diverticulum of bladder: Secondary | ICD-10-CM | POA: Diagnosis not present

## 2021-02-13 DIAGNOSIS — M810 Age-related osteoporosis without current pathological fracture: Secondary | ICD-10-CM | POA: Diagnosis not present

## 2021-02-13 DIAGNOSIS — R339 Retention of urine, unspecified: Secondary | ICD-10-CM | POA: Diagnosis not present

## 2021-02-13 DIAGNOSIS — N320 Bladder-neck obstruction: Secondary | ICD-10-CM | POA: Diagnosis not present

## 2021-02-13 DIAGNOSIS — Z466 Encounter for fitting and adjustment of urinary device: Secondary | ICD-10-CM | POA: Diagnosis not present

## 2021-02-13 DIAGNOSIS — S72002D Fracture of unspecified part of neck of left femur, subsequent encounter for closed fracture with routine healing: Secondary | ICD-10-CM | POA: Diagnosis not present

## 2021-02-15 DIAGNOSIS — N323 Diverticulum of bladder: Secondary | ICD-10-CM | POA: Diagnosis not present

## 2021-02-15 DIAGNOSIS — N320 Bladder-neck obstruction: Secondary | ICD-10-CM | POA: Diagnosis not present

## 2021-02-15 DIAGNOSIS — M810 Age-related osteoporosis without current pathological fracture: Secondary | ICD-10-CM | POA: Diagnosis not present

## 2021-02-15 DIAGNOSIS — R339 Retention of urine, unspecified: Secondary | ICD-10-CM | POA: Diagnosis not present

## 2021-02-15 DIAGNOSIS — Z466 Encounter for fitting and adjustment of urinary device: Secondary | ICD-10-CM | POA: Diagnosis not present

## 2021-02-15 DIAGNOSIS — S72002D Fracture of unspecified part of neck of left femur, subsequent encounter for closed fracture with routine healing: Secondary | ICD-10-CM | POA: Diagnosis not present

## 2021-02-17 ENCOUNTER — Other Ambulatory Visit: Payer: Self-pay | Admitting: Physical Medicine and Rehabilitation

## 2021-02-19 ENCOUNTER — Encounter: Payer: Medicare Other | Admitting: Orthopedic Surgery

## 2021-02-19 DIAGNOSIS — M810 Age-related osteoporosis without current pathological fracture: Secondary | ICD-10-CM | POA: Diagnosis not present

## 2021-02-19 DIAGNOSIS — N320 Bladder-neck obstruction: Secondary | ICD-10-CM | POA: Diagnosis not present

## 2021-02-19 DIAGNOSIS — Z466 Encounter for fitting and adjustment of urinary device: Secondary | ICD-10-CM | POA: Diagnosis not present

## 2021-02-19 DIAGNOSIS — R339 Retention of urine, unspecified: Secondary | ICD-10-CM | POA: Diagnosis not present

## 2021-02-19 DIAGNOSIS — S72002D Fracture of unspecified part of neck of left femur, subsequent encounter for closed fracture with routine healing: Secondary | ICD-10-CM | POA: Diagnosis not present

## 2021-02-19 DIAGNOSIS — N323 Diverticulum of bladder: Secondary | ICD-10-CM | POA: Diagnosis not present

## 2021-02-20 DIAGNOSIS — M79605 Pain in left leg: Secondary | ICD-10-CM | POA: Diagnosis not present

## 2021-02-20 DIAGNOSIS — M7060 Trochanteric bursitis, unspecified hip: Secondary | ICD-10-CM | POA: Diagnosis not present

## 2021-02-20 DIAGNOSIS — M961 Postlaminectomy syndrome, not elsewhere classified: Secondary | ICD-10-CM | POA: Diagnosis not present

## 2021-02-20 DIAGNOSIS — Z466 Encounter for fitting and adjustment of urinary device: Secondary | ICD-10-CM | POA: Diagnosis not present

## 2021-02-20 DIAGNOSIS — R339 Retention of urine, unspecified: Secondary | ICD-10-CM | POA: Diagnosis not present

## 2021-02-20 DIAGNOSIS — G894 Chronic pain syndrome: Secondary | ICD-10-CM | POA: Diagnosis not present

## 2021-02-20 DIAGNOSIS — M810 Age-related osteoporosis without current pathological fracture: Secondary | ICD-10-CM | POA: Diagnosis not present

## 2021-02-20 DIAGNOSIS — I252 Old myocardial infarction: Secondary | ICD-10-CM | POA: Diagnosis not present

## 2021-02-20 DIAGNOSIS — S72002D Fracture of unspecified part of neck of left femur, subsequent encounter for closed fracture with routine healing: Secondary | ICD-10-CM | POA: Diagnosis not present

## 2021-02-20 DIAGNOSIS — M5416 Radiculopathy, lumbar region: Secondary | ICD-10-CM | POA: Diagnosis not present

## 2021-02-20 DIAGNOSIS — Z9981 Dependence on supplemental oxygen: Secondary | ICD-10-CM | POA: Diagnosis not present

## 2021-02-20 DIAGNOSIS — N323 Diverticulum of bladder: Secondary | ICD-10-CM | POA: Diagnosis not present

## 2021-02-20 DIAGNOSIS — N320 Bladder-neck obstruction: Secondary | ICD-10-CM | POA: Diagnosis not present

## 2021-02-24 ENCOUNTER — Other Ambulatory Visit: Payer: Self-pay | Admitting: Physical Medicine and Rehabilitation

## 2021-02-25 ENCOUNTER — Other Ambulatory Visit: Payer: Self-pay | Admitting: Physical Medicine and Rehabilitation

## 2021-03-03 ENCOUNTER — Other Ambulatory Visit: Payer: Self-pay | Admitting: Physical Medicine and Rehabilitation

## 2021-03-13 DIAGNOSIS — R339 Retention of urine, unspecified: Secondary | ICD-10-CM | POA: Diagnosis not present

## 2021-03-13 DIAGNOSIS — N323 Diverticulum of bladder: Secondary | ICD-10-CM | POA: Diagnosis not present

## 2021-03-13 DIAGNOSIS — M810 Age-related osteoporosis without current pathological fracture: Secondary | ICD-10-CM | POA: Diagnosis not present

## 2021-03-13 DIAGNOSIS — N320 Bladder-neck obstruction: Secondary | ICD-10-CM | POA: Diagnosis not present

## 2021-03-13 DIAGNOSIS — Z466 Encounter for fitting and adjustment of urinary device: Secondary | ICD-10-CM | POA: Diagnosis not present

## 2021-03-13 DIAGNOSIS — S72002D Fracture of unspecified part of neck of left femur, subsequent encounter for closed fracture with routine healing: Secondary | ICD-10-CM | POA: Diagnosis not present

## 2021-03-17 ENCOUNTER — Other Ambulatory Visit: Payer: Self-pay | Admitting: Physical Medicine and Rehabilitation

## 2021-03-22 DIAGNOSIS — M5416 Radiculopathy, lumbar region: Secondary | ICD-10-CM | POA: Diagnosis not present

## 2021-03-22 DIAGNOSIS — Z9981 Dependence on supplemental oxygen: Secondary | ICD-10-CM | POA: Diagnosis not present

## 2021-03-22 DIAGNOSIS — I252 Old myocardial infarction: Secondary | ICD-10-CM | POA: Diagnosis not present

## 2021-03-22 DIAGNOSIS — M810 Age-related osteoporosis without current pathological fracture: Secondary | ICD-10-CM | POA: Diagnosis not present

## 2021-03-22 DIAGNOSIS — Z466 Encounter for fitting and adjustment of urinary device: Secondary | ICD-10-CM | POA: Diagnosis not present

## 2021-03-22 DIAGNOSIS — S72002D Fracture of unspecified part of neck of left femur, subsequent encounter for closed fracture with routine healing: Secondary | ICD-10-CM | POA: Diagnosis not present

## 2021-03-22 DIAGNOSIS — N323 Diverticulum of bladder: Secondary | ICD-10-CM | POA: Diagnosis not present

## 2021-03-22 DIAGNOSIS — R339 Retention of urine, unspecified: Secondary | ICD-10-CM | POA: Diagnosis not present

## 2021-03-22 DIAGNOSIS — N320 Bladder-neck obstruction: Secondary | ICD-10-CM | POA: Diagnosis not present

## 2021-03-24 ENCOUNTER — Other Ambulatory Visit: Payer: Self-pay | Admitting: Physical Medicine and Rehabilitation

## 2021-03-24 DIAGNOSIS — G894 Chronic pain syndrome: Secondary | ICD-10-CM | POA: Diagnosis not present

## 2021-03-24 DIAGNOSIS — M961 Postlaminectomy syndrome, not elsewhere classified: Secondary | ICD-10-CM | POA: Diagnosis not present

## 2021-03-24 DIAGNOSIS — M1612 Unilateral primary osteoarthritis, left hip: Secondary | ICD-10-CM | POA: Diagnosis not present

## 2021-03-24 DIAGNOSIS — M25519 Pain in unspecified shoulder: Secondary | ICD-10-CM | POA: Diagnosis not present

## 2021-03-27 DIAGNOSIS — E039 Hypothyroidism, unspecified: Secondary | ICD-10-CM | POA: Diagnosis not present

## 2021-03-27 DIAGNOSIS — L97919 Non-pressure chronic ulcer of unspecified part of right lower leg with unspecified severity: Secondary | ICD-10-CM | POA: Diagnosis not present

## 2021-03-27 DIAGNOSIS — Z23 Encounter for immunization: Secondary | ICD-10-CM | POA: Diagnosis not present

## 2021-03-27 DIAGNOSIS — L0390 Cellulitis, unspecified: Secondary | ICD-10-CM | POA: Diagnosis not present

## 2021-03-27 DIAGNOSIS — Z681 Body mass index (BMI) 19 or less, adult: Secondary | ICD-10-CM | POA: Diagnosis not present

## 2021-03-27 DIAGNOSIS — M25511 Pain in right shoulder: Secondary | ICD-10-CM | POA: Diagnosis not present

## 2021-04-08 DIAGNOSIS — Z23 Encounter for immunization: Secondary | ICD-10-CM | POA: Diagnosis not present

## 2021-04-09 DIAGNOSIS — M810 Age-related osteoporosis without current pathological fracture: Secondary | ICD-10-CM | POA: Diagnosis not present

## 2021-04-09 DIAGNOSIS — S72002D Fracture of unspecified part of neck of left femur, subsequent encounter for closed fracture with routine healing: Secondary | ICD-10-CM | POA: Diagnosis not present

## 2021-04-09 DIAGNOSIS — N323 Diverticulum of bladder: Secondary | ICD-10-CM | POA: Diagnosis not present

## 2021-04-09 DIAGNOSIS — Z466 Encounter for fitting and adjustment of urinary device: Secondary | ICD-10-CM | POA: Diagnosis not present

## 2021-04-09 DIAGNOSIS — N320 Bladder-neck obstruction: Secondary | ICD-10-CM | POA: Diagnosis not present

## 2021-04-09 DIAGNOSIS — R339 Retention of urine, unspecified: Secondary | ICD-10-CM | POA: Diagnosis not present

## 2021-04-14 DIAGNOSIS — J441 Chronic obstructive pulmonary disease with (acute) exacerbation: Secondary | ICD-10-CM | POA: Diagnosis not present

## 2021-04-14 DIAGNOSIS — I10 Essential (primary) hypertension: Secondary | ICD-10-CM | POA: Diagnosis not present

## 2021-04-14 DIAGNOSIS — Z87891 Personal history of nicotine dependence: Secondary | ICD-10-CM | POA: Diagnosis not present

## 2021-04-14 DIAGNOSIS — I872 Venous insufficiency (chronic) (peripheral): Secondary | ICD-10-CM | POA: Diagnosis not present

## 2021-04-16 DIAGNOSIS — N320 Bladder-neck obstruction: Secondary | ICD-10-CM | POA: Diagnosis not present

## 2021-04-16 DIAGNOSIS — R339 Retention of urine, unspecified: Secondary | ICD-10-CM | POA: Diagnosis not present

## 2021-04-16 DIAGNOSIS — S72002D Fracture of unspecified part of neck of left femur, subsequent encounter for closed fracture with routine healing: Secondary | ICD-10-CM | POA: Diagnosis not present

## 2021-04-16 DIAGNOSIS — N323 Diverticulum of bladder: Secondary | ICD-10-CM | POA: Diagnosis not present

## 2021-04-16 DIAGNOSIS — R3914 Feeling of incomplete bladder emptying: Secondary | ICD-10-CM | POA: Diagnosis not present

## 2021-04-16 DIAGNOSIS — Z466 Encounter for fitting and adjustment of urinary device: Secondary | ICD-10-CM | POA: Diagnosis not present

## 2021-04-16 DIAGNOSIS — M810 Age-related osteoporosis without current pathological fracture: Secondary | ICD-10-CM | POA: Diagnosis not present

## 2021-04-17 ENCOUNTER — Telehealth: Payer: Self-pay | Admitting: Cardiology

## 2021-04-17 NOTE — Telephone Encounter (Signed)
Patient c/o Palpitations:  High priority if patient c/o lightheadedness, shortness of breath, or chest pain  How long have you had palpitations/irregular HR/ Afib? Are you having the symptoms now? Since Monday, not now, happens at night usually  Are you currently experiencing lightheadedness, SOB or CP? no  Do you have a history of afib (atrial fibrillation) or irregular heart rhythm? no  Have you checked your BP or HR? (document readings if available): yesterday states it was kinda elevated but was not told what the readings were  Are you experiencing any other symptoms? No   Patient states she has been having palpitations since Monday. She says they usually happen at night and is not having any other symptoms.

## 2021-04-17 NOTE — Telephone Encounter (Signed)
Left message to return call. --------------------------------------------------------------------------------------------  Looks like this patient previously was a [place] patient - last seen 07/31/2019.     OV has already been scheduled for 05/14/2021 with Dr. Wyline Mood.   Need to reschedule this as she has never seen JB in office, if she is going to follow here with [facility].    Per epic - patient had visit with [facility] 12/19/2020 - Dr. Sharrell Ku.    Do not need to be seeing multiple cardiologist.

## 2021-04-18 DIAGNOSIS — R600 Localized edema: Secondary | ICD-10-CM | POA: Diagnosis not present

## 2021-04-18 DIAGNOSIS — I2510 Atherosclerotic heart disease of native coronary artery without angina pectoris: Secondary | ICD-10-CM | POA: Diagnosis not present

## 2021-04-18 DIAGNOSIS — S81802A Unspecified open wound, left lower leg, initial encounter: Secondary | ICD-10-CM | POA: Diagnosis not present

## 2021-04-18 DIAGNOSIS — E7800 Pure hypercholesterolemia, unspecified: Secondary | ICD-10-CM | POA: Diagnosis not present

## 2021-04-18 DIAGNOSIS — Z882 Allergy status to sulfonamides status: Secondary | ICD-10-CM | POA: Diagnosis not present

## 2021-04-18 DIAGNOSIS — L97929 Non-pressure chronic ulcer of unspecified part of left lower leg with unspecified severity: Secondary | ICD-10-CM | POA: Diagnosis not present

## 2021-04-18 DIAGNOSIS — Z87891 Personal history of nicotine dependence: Secondary | ICD-10-CM | POA: Diagnosis not present

## 2021-04-18 DIAGNOSIS — Z881 Allergy status to other antibiotic agents status: Secondary | ICD-10-CM | POA: Diagnosis not present

## 2021-04-18 DIAGNOSIS — I10 Essential (primary) hypertension: Secondary | ICD-10-CM | POA: Diagnosis not present

## 2021-04-18 DIAGNOSIS — J449 Chronic obstructive pulmonary disease, unspecified: Secondary | ICD-10-CM | POA: Diagnosis not present

## 2021-04-18 DIAGNOSIS — Z7982 Long term (current) use of aspirin: Secondary | ICD-10-CM | POA: Diagnosis not present

## 2021-04-18 DIAGNOSIS — Z79899 Other long term (current) drug therapy: Secondary | ICD-10-CM | POA: Diagnosis not present

## 2021-04-19 ENCOUNTER — Encounter (HOSPITAL_COMMUNITY): Payer: Self-pay | Admitting: Emergency Medicine

## 2021-04-19 ENCOUNTER — Emergency Department (HOSPITAL_COMMUNITY): Payer: Medicare Other

## 2021-04-19 ENCOUNTER — Inpatient Hospital Stay (HOSPITAL_COMMUNITY)
Admission: EM | Admit: 2021-04-19 | Discharge: 2021-04-24 | DRG: 243 | Disposition: A | Discharge status: Home / Self Care | Payer: Medicare Other | Attending: Family Medicine | Admitting: Family Medicine

## 2021-04-19 ENCOUNTER — Other Ambulatory Visit: Payer: Self-pay

## 2021-04-19 DIAGNOSIS — I2510 Atherosclerotic heart disease of native coronary artery without angina pectoris: Secondary | ICD-10-CM | POA: Diagnosis present

## 2021-04-19 DIAGNOSIS — L03116 Cellulitis of left lower limb: Secondary | ICD-10-CM | POA: Diagnosis not present

## 2021-04-19 DIAGNOSIS — I441 Atrioventricular block, second degree: Principal | ICD-10-CM | POA: Diagnosis present

## 2021-04-19 DIAGNOSIS — Z20822 Contact with and (suspected) exposure to covid-19: Secondary | ICD-10-CM | POA: Diagnosis not present

## 2021-04-19 DIAGNOSIS — R000 Tachycardia, unspecified: Secondary | ICD-10-CM | POA: Diagnosis not present

## 2021-04-19 DIAGNOSIS — I639 Cerebral infarction, unspecified: Secondary | ICD-10-CM

## 2021-04-19 DIAGNOSIS — I110 Hypertensive heart disease with heart failure: Secondary | ICD-10-CM | POA: Diagnosis present

## 2021-04-19 DIAGNOSIS — K219 Gastro-esophageal reflux disease without esophagitis: Secondary | ICD-10-CM | POA: Diagnosis present

## 2021-04-19 DIAGNOSIS — Z79890 Hormone replacement therapy (postmenopausal): Secondary | ICD-10-CM

## 2021-04-19 DIAGNOSIS — T50905A Adverse effect of unspecified drugs, medicaments and biological substances, initial encounter: Secondary | ICD-10-CM

## 2021-04-19 DIAGNOSIS — R29700 NIHSS score 0: Secondary | ICD-10-CM | POA: Diagnosis present

## 2021-04-19 DIAGNOSIS — R64 Cachexia: Secondary | ICD-10-CM | POA: Diagnosis not present

## 2021-04-19 DIAGNOSIS — R001 Bradycardia, unspecified: Secondary | ICD-10-CM | POA: Diagnosis not present

## 2021-04-19 DIAGNOSIS — E039 Hypothyroidism, unspecified: Secondary | ICD-10-CM | POA: Diagnosis present

## 2021-04-19 DIAGNOSIS — I5189 Other ill-defined heart diseases: Secondary | ICD-10-CM

## 2021-04-19 DIAGNOSIS — Z7982 Long term (current) use of aspirin: Secondary | ICD-10-CM

## 2021-04-19 DIAGNOSIS — I10 Essential (primary) hypertension: Secondary | ICD-10-CM | POA: Diagnosis present

## 2021-04-19 DIAGNOSIS — I5032 Chronic diastolic (congestive) heart failure: Secondary | ICD-10-CM | POA: Diagnosis not present

## 2021-04-19 DIAGNOSIS — J449 Chronic obstructive pulmonary disease, unspecified: Secondary | ICD-10-CM | POA: Diagnosis not present

## 2021-04-19 DIAGNOSIS — I4430 Unspecified atrioventricular block: Secondary | ICD-10-CM | POA: Diagnosis present

## 2021-04-19 DIAGNOSIS — Z833 Family history of diabetes mellitus: Secondary | ICD-10-CM

## 2021-04-19 DIAGNOSIS — I252 Old myocardial infarction: Secondary | ICD-10-CM

## 2021-04-19 DIAGNOSIS — M7989 Other specified soft tissue disorders: Secondary | ICD-10-CM | POA: Diagnosis not present

## 2021-04-19 DIAGNOSIS — I160 Hypertensive urgency: Secondary | ICD-10-CM | POA: Diagnosis not present

## 2021-04-19 DIAGNOSIS — E890 Postprocedural hypothyroidism: Secondary | ICD-10-CM | POA: Diagnosis present

## 2021-04-19 DIAGNOSIS — L97829 Non-pressure chronic ulcer of other part of left lower leg with unspecified severity: Secondary | ICD-10-CM | POA: Diagnosis not present

## 2021-04-19 DIAGNOSIS — Z681 Body mass index (BMI) 19 or less, adult: Secondary | ICD-10-CM

## 2021-04-19 DIAGNOSIS — R4781 Slurred speech: Secondary | ICD-10-CM | POA: Diagnosis present

## 2021-04-19 DIAGNOSIS — I4510 Unspecified right bundle-branch block: Secondary | ICD-10-CM | POA: Diagnosis present

## 2021-04-19 DIAGNOSIS — E876 Hypokalemia: Secondary | ICD-10-CM | POA: Diagnosis present

## 2021-04-19 DIAGNOSIS — E785 Hyperlipidemia, unspecified: Secondary | ICD-10-CM | POA: Diagnosis present

## 2021-04-19 DIAGNOSIS — Z87891 Personal history of nicotine dependence: Secondary | ICD-10-CM

## 2021-04-19 DIAGNOSIS — Z95818 Presence of other cardiac implants and grafts: Secondary | ICD-10-CM

## 2021-04-19 DIAGNOSIS — L97909 Non-pressure chronic ulcer of unspecified part of unspecified lower leg with unspecified severity: Secondary | ICD-10-CM | POA: Diagnosis present

## 2021-04-19 DIAGNOSIS — Z8249 Family history of ischemic heart disease and other diseases of the circulatory system: Secondary | ICD-10-CM

## 2021-04-19 DIAGNOSIS — Z955 Presence of coronary angioplasty implant and graft: Secondary | ICD-10-CM

## 2021-04-19 DIAGNOSIS — R29810 Facial weakness: Secondary | ICD-10-CM | POA: Diagnosis present

## 2021-04-19 DIAGNOSIS — Z79899 Other long term (current) drug therapy: Secondary | ICD-10-CM

## 2021-04-19 LAB — CBC WITH DIFFERENTIAL/PLATELET
Abs Immature Granulocytes: 0.03 10*3/uL (ref 0.00–0.07)
Basophils Absolute: 0 10*3/uL (ref 0.0–0.1)
Basophils Relative: 0 %
Eosinophils Absolute: 0.1 10*3/uL (ref 0.0–0.5)
Eosinophils Relative: 1 %
HCT: 34.8 % — ABNORMAL LOW (ref 36.0–46.0)
Hemoglobin: 11.5 g/dL — ABNORMAL LOW (ref 12.0–15.0)
Immature Granulocytes: 0 %
Lymphocytes Relative: 13 %
Lymphs Abs: 0.9 10*3/uL (ref 0.7–4.0)
MCH: 31.4 pg (ref 26.0–34.0)
MCHC: 33 g/dL (ref 30.0–36.0)
MCV: 95.1 fL (ref 80.0–100.0)
Monocytes Absolute: 0.6 10*3/uL (ref 0.1–1.0)
Monocytes Relative: 8 %
Neutro Abs: 5.4 10*3/uL (ref 1.7–7.7)
Neutrophils Relative %: 78 %
Platelets: 177 10*3/uL (ref 150–400)
RBC: 3.66 MIL/uL — ABNORMAL LOW (ref 3.87–5.11)
RDW: 13.2 % (ref 11.5–15.5)
WBC: 7 10*3/uL (ref 4.0–10.5)
nRBC: 0 % (ref 0.0–0.2)

## 2021-04-19 LAB — BASIC METABOLIC PANEL
Anion gap: 7 (ref 5–15)
BUN: 31 mg/dL — ABNORMAL HIGH (ref 8–23)
CO2: 26 mmol/L (ref 22–32)
Calcium: 8.7 mg/dL — ABNORMAL LOW (ref 8.9–10.3)
Chloride: 110 mmol/L (ref 98–111)
Creatinine, Ser: 0.77 mg/dL (ref 0.44–1.00)
GFR, Estimated: >60 mL/min (ref >60)
Glucose, Bld: 106 mg/dL — ABNORMAL HIGH (ref 70–99)
Potassium: 3.4 mmol/L — ABNORMAL LOW (ref 3.5–5.1)
Sodium: 143 mmol/L (ref 135–145)

## 2021-04-19 LAB — BRAIN NATRIURETIC PEPTIDE: B Natriuretic Peptide: 863 pg/mL — ABNORMAL HIGH (ref 0.0–100.0)

## 2021-04-19 LAB — RESP PANEL BY RT-PCR (FLU A&B, COVID) ARPGX2
Influenza A by PCR: NEGATIVE
Influenza B by PCR: NEGATIVE
SARS Coronavirus 2 by RT PCR: NEGATIVE

## 2021-04-19 MED ORDER — ATROPINE SULFATE 1 MG/10ML IJ SOSY
0.5000 mg | PREFILLED_SYRINGE | Freq: Once | INTRAMUSCULAR | Status: AC
  Administered 2021-04-19: 0.5 mg via INTRAVENOUS
  Filled 2021-04-19: qty 10

## 2021-04-19 MED ORDER — TEMAZEPAM 15 MG PO CAPS
30.0000 mg | ORAL_CAPSULE | Freq: Every day | ORAL | Status: DC
  Administered 2021-04-20 – 2021-04-23 (×5): 30 mg via ORAL
  Filled 2021-04-19: qty 4
  Filled 2021-04-19 (×2): qty 2
  Filled 2021-04-19 (×2): qty 4

## 2021-04-19 MED ORDER — DOCUSATE SODIUM 100 MG PO CAPS: 100.0000 mg | ORAL_CAPSULE | Freq: Two times a day (BID) | ORAL | Status: DC | PRN

## 2021-04-19 MED ORDER — HYDRALAZINE HCL 20 MG/ML IJ SOLN
10.0000 mg | INTRAMUSCULAR | Status: DC | PRN
  Administered 2021-04-20 (×2): 10 mg via INTRAVENOUS
  Filled 2021-04-19 (×2): qty 1

## 2021-04-19 MED ORDER — PANTOPRAZOLE SODIUM 40 MG PO TBEC
40.0000 mg | DELAYED_RELEASE_TABLET | Freq: Every day | ORAL | Status: DC
  Administered 2021-04-20 – 2021-04-24 (×5): 40 mg via ORAL
  Filled 2021-04-19 (×5): qty 1

## 2021-04-19 MED ORDER — POLYETHYLENE GLYCOL 3350 17 G PO PACK
17.0000 g | PACK | Freq: Every day | ORAL | Status: DC | PRN
  Administered 2021-04-20: 17 g via ORAL

## 2021-04-19 MED ORDER — ASPIRIN 81 MG PO CHEW
81.0000 mg | CHEWABLE_TABLET | Freq: Two times a day (BID) | ORAL | Status: DC
  Administered 2021-04-20 – 2021-04-24 (×9): 81 mg via ORAL
  Filled 2021-04-19 (×9): qty 1

## 2021-04-19 MED ORDER — HYOSCYAMINE SULFATE 0.125 MG SL SUBL
0.1250 mg | SUBLINGUAL_TABLET | Freq: Four times a day (QID) | SUBLINGUAL | Status: DC | PRN
  Filled 2021-04-19: qty 1

## 2021-04-19 MED ORDER — LEVOTHYROXINE SODIUM 25 MCG PO TABS
25.0000 ug | ORAL_TABLET | Freq: Every day | ORAL | Status: DC
  Administered 2021-04-20 – 2021-04-24 (×5): 25 ug via ORAL
  Filled 2021-04-19 (×5): qty 1

## 2021-04-19 MED ORDER — TRAMADOL HCL 50 MG PO TABS
50.0000 mg | ORAL_TABLET | Freq: Every evening | ORAL | Status: DC | PRN
  Administered 2021-04-23: 50 mg via ORAL
  Filled 2021-04-19: qty 1

## 2021-04-19 MED ORDER — BENAZEPRIL HCL 10 MG PO TABS
5.0000 mg | ORAL_TABLET | Freq: Every day | ORAL | Status: DC
  Administered 2021-04-20: 5 mg via ORAL
  Filled 2021-04-19: qty 1

## 2021-04-19 MED ORDER — NALOXEGOL OXALATE 12.5 MG PO TABS
25.0000 mg | ORAL_TABLET | Freq: Every day | ORAL | Status: DC
  Administered 2021-04-20 – 2021-04-24 (×4): 25 mg via ORAL
  Filled 2021-04-19 (×2): qty 1
  Filled 2021-04-19 (×5): qty 2

## 2021-04-19 MED ORDER — HYDRALAZINE HCL 20 MG/ML IJ SOLN
10.0000 mg | Freq: Once | INTRAMUSCULAR | Status: AC
  Administered 2021-04-19: 10 mg via INTRAVENOUS
  Filled 2021-04-19: qty 1

## 2021-04-19 MED ORDER — ENOXAPARIN SODIUM 40 MG/0.4ML IJ SOSY
40.0000 mg | PREFILLED_SYRINGE | INTRAMUSCULAR | Status: DC
  Administered 2021-04-20 – 2021-04-22 (×3): 40 mg via SUBCUTANEOUS
  Filled 2021-04-19 (×3): qty 0.4

## 2021-04-19 MED ORDER — METHOCARBAMOL 500 MG PO TABS: 500.0000 mg | ORAL_TABLET | Freq: Four times a day (QID) | ORAL | Status: DC | PRN

## 2021-04-19 MED ORDER — DOXYCYCLINE HYCLATE 100 MG PO TABS
100.0000 mg | ORAL_TABLET | Freq: Once | ORAL | Status: AC
  Administered 2021-04-19: 100 mg via ORAL
  Filled 2021-04-19: qty 1

## 2021-04-19 MED ORDER — HYDROCODONE-ACETAMINOPHEN 10-325 MG PO TABS
1.0000 | ORAL_TABLET | ORAL | Status: DC | PRN
  Administered 2021-04-22 – 2021-04-24 (×3): 1 via ORAL
  Filled 2021-04-19 (×4): qty 1

## 2021-04-19 MED ORDER — ATORVASTATIN CALCIUM 10 MG PO TABS
10.0000 mg | ORAL_TABLET | Freq: Every day | ORAL | Status: DC
  Administered 2021-04-20 – 2021-04-23 (×4): 10 mg via ORAL
  Filled 2021-04-19 (×4): qty 1

## 2021-04-19 MED ORDER — MELATONIN 3 MG PO TABS
9.0000 mg | ORAL_TABLET | Freq: Every day | ORAL | Status: DC
  Administered 2021-04-20 – 2021-04-23 (×5): 9 mg via ORAL
  Filled 2021-04-19 (×5): qty 3

## 2021-04-19 MED ORDER — RALOXIFENE HCL 60 MG PO TABS
60.0000 mg | ORAL_TABLET | Freq: Every day | ORAL | Status: DC
  Administered 2021-04-20 – 2021-04-24 (×5): 60 mg via ORAL
  Filled 2021-04-19: qty 1
  Filled 2021-04-19: qty 2
  Filled 2021-04-19 (×5): qty 1

## 2021-04-19 MED ORDER — LUBIPROSTONE 24 MCG PO CAPS
24.0000 ug | ORAL_CAPSULE | Freq: Every day | ORAL | Status: DC
  Administered 2021-04-20 – 2021-04-24 (×5): 24 ug via ORAL
  Filled 2021-04-19 (×5): qty 1

## 2021-04-19 MED ORDER — TRAZODONE HCL 50 MG PO TABS: 50.0000 mg | ORAL_TABLET | Freq: Every evening | ORAL | Status: DC | PRN

## 2021-04-19 MED ORDER — HYDRALAZINE HCL 20 MG/ML IJ SOLN
5.0000 mg | Freq: Once | INTRAMUSCULAR | Status: AC
  Administered 2021-04-19: 5 mg via INTRAVENOUS
  Filled 2021-04-19: qty 1

## 2021-04-19 MED ORDER — MOMETASONE FURO-FORMOTEROL FUM 100-5 MCG/ACT IN AERO
2.0000 | INHALATION_SPRAY | Freq: Two times a day (BID) | RESPIRATORY_TRACT | Status: DC
  Administered 2021-04-20 – 2021-04-24 (×8): 2 via RESPIRATORY_TRACT
  Filled 2021-04-19 (×3): qty 8.8

## 2021-04-19 MED ORDER — DOXYCYCLINE HYCLATE 100 MG PO TABS
100.0000 mg | ORAL_TABLET | Freq: Two times a day (BID) | ORAL | Status: DC
  Administered 2021-04-20 – 2021-04-24 (×9): 100 mg via ORAL
  Filled 2021-04-19 (×9): qty 1

## 2021-04-19 MED ORDER — POLYETHYLENE GLYCOL 3350 17 G PO PACK
17.0000 g | PACK | ORAL | Status: DC
  Administered 2021-04-20 – 2021-04-23 (×3): 17 g via ORAL
  Filled 2021-04-19 (×4): qty 1

## 2021-04-19 NOTE — ED Triage Notes (Signed)
Pt to the ED with complaints of bilateral leg swelling.

## 2021-04-19 NOTE — H&P (Addendum)
History and Physical  Mckenzie [name] IRS:[id number] DOB: 1935-10-28 DOA: 04/19/2021  Referring physician: Fayrene Helper, PA-C, EDP PCP: Sheela Stack  Outpatient Specialists:   Patient Coming From: home  Chief Complaint: leg swelling  HPI: Mckenzie [name] is a 85 y.o. female with a history of coronary artery disease with history of MI, COPD, HFpEF, GERD, hypothyroidism, hypertension.  Patient seen for leg swelling due to chronic leg ulcerations on her left lower leg.  She was seen at wound care yesterday and the ulcerations were removed and dressing applied.  It appears that they were getting red the patient came for evaluation.  In addition, the patient has been having some mild palpitations occasionally.  She has been having some trouble breathing at night with some shaking it has been noticed by her husband.  No cough, chest pain, shortness of breath, body aches, nausea, vomiting.  She has been taking her blood pressure medication which is metoprolol 25 mg twice a day  Emergency Department Course: On arrival, heart rate in the 30s.  Blood pressure 191/41.  Patient was given atropine which mildly increased her heart rate to 47.  Her blood pressures remained in the 180s to 200 range.  EDP discussed with cardiology, who recommended observation and holding patient's metoprolol to evaluate for improvement.  Review of Systems:   Pt denies any fevers, chills, nausea, vomiting, diarrhea, constipation, abdominal pain, shortness of breath, dyspnea on exertion, orthopnea, cough, wheezing,  headache, vision changes, lightheadedness, dizziness, melena, rectal bleeding.  Review of systems are otherwise negative  Past Medical History:  Diagnosis Date   Aortic valve sclerosis    Mild, echo, April, 2012   ARDS (adult respiratory distress syndrome) (HCC) 2003   with tracheostomy-peritonitis   Arthritis    Back pain    Need for surgery, Dr. Trey Sailors   CAD (coronary artery disease)     Stent to LAD, 2003 [facility] / nuclear scan April, 201 to artifact / persistent shortness of breath / catheterization Oct 21, 2010... widely patent stent to the mid LAD, no significant obstructive disease, should be low risk    for back surgery   Carotid arterial disease (HCC)    Moderate-Dr. Myra Gianotti following   Chronic back pain    COPD (chronic obstructive pulmonary disease) (HCC)    Diastolic dysfunction    Echo, April, 2011   EF 65%   Dysrhythmia    afib   Ejection fraction    EF 65%, echo, September 15, 2010   GERD (gastroesophageal reflux disease)    Hemolytic anemia (HCC)    Followed by Dr. Derald Macleod   Hypertension    Hypothyroidism    Left ventricular hypertrophy    EF 65%, echo, April, 2011   MR (mitral regurgitation)     Mild echo, 2012 ///  Mild per Echo 2009,  mild, echo, April, 2011   Myocardial infarction [facility])    Palpitations    Holter, September 16, 2010, PACs and PVCs,one 4 beat run of SVT   Pneumonia 11-2011   Preoperative clearance    Preop clearance for back surgery   Shortness of breath    March, 2012   UTI (urinary tract infection)    Chronic-on Macrodantin   Past Surgical History:  Procedure Laterality Date   APPENDECTOMY     BACK SURGERY     BREAST LUMPECTOMY Left    CATARACT EXTRACTION W/PHACO Right 12/05/2012   Procedure: CATARACT EXTRACTION PHACO AND INTRAOCULAR LENS PLACEMENT (IOC);  Surgeon:  Tonny Branch, MD;  Location: [facility];  Service: Ophthalmology;  Laterality: Right;  CDE:15.30   CATARACT EXTRACTION W/PHACO Left 12/15/2012   Procedure: CATARACT EXTRACTION PHACO AND INTRAOCULAR LENS PLACEMENT (IOC);  Surgeon: Tonny Branch, MD;  Location: [facility];  Service: Ophthalmology;  Laterality: Left;  CDE:22.56   CORONARY ANGIOPLASTY WITH STENT PLACEMENT  2003   INTRAMEDULLARY (IM) NAIL INTERTROCHANTERIC Left 09/27/2020   Procedure: INTRAMEDULLARY (IM) NAIL INTERTROCHANTRIC;  Surgeon: Meredith Pel, MD;  Location: [facility];  Service: Orthopedics;  Laterality: Left;  left  trochanter   repair of ruptured lumbar discs  12-2011   SPINE SURGERY     lumbar disk removal by Dr. Francesco Runner at [facility]  08/2009   Benign adenomas   TONSILLECTOMY     TRACHEOSTOMY  2003   Following ARDS    Social History:  reports that she quit smoking about 20 years ago. Her smoking use included cigarettes. She has a 20.00 pack-year smoking history. She has never used smokeless tobacco. She reports that she does not drink alcohol and does not use drugs. Patient lives at home  Allergies  Allergen Reactions   Tetracycline Nausea Only    Family History  Problem Relation Age of Onset   Heart failure Father    Diabetes Son       Prior to Admission medications   Medication Sig Start Date End Date Taking? Authorizing Provider  acetaminophen (TYLENOL) 325 MG tablet Take 1-2 tablets (325-650 mg total) by mouth every 4 (four) hours as needed for mild pain. 10/11/20  Yes Love, Ivan Anchors, PA-C  atorvastatin (LIPITOR) 10 MG tablet Take 10 mg by mouth daily.  07/22/12  Yes [provider]  benazepril (LOTENSIN) 5 MG tablet Take 5 mg by mouth daily.   Yes [provider]  Calcium Carbonate (CALCIUM 600 PO) Take 1,200 mg by mouth daily.   Yes [provider]  Cholecalciferol (VITAMIN D3) 1000 UNITS CAPS Take 1,000 Units by mouth daily. Take one tab daily   Yes [provider]  conjugated estrogens (PREMARIN) vaginal cream Place 1 Applicatorful vaginally daily.   Yes [provider]  diphenhydrAMINE (BENADRYL) 25 mg capsule Take 25 mg by mouth daily as needed for sleep.   Yes [provider]  esomeprazole (NEXIUM) 40 MG capsule Take 40 mg by mouth daily before breakfast.   Yes [provider]  estrogens, conjugated, (PREMARIN) 0.625 MG tablet Take 0.625 mg by mouth daily.   Yes [provider]  HYDROcodone-acetaminophen (NORCO) 10-325 MG tablet Take 1 tablet by mouth every 4 (four) hours as needed for  severe pain. 10/11/20  Yes Love, Ivan Anchors, PA-C  hyoscyamine (LEVSIN SL) 0.125 MG SL tablet Take 0.125 mg by mouth every 6 (six) hours as needed for cramping or diarrhea or loose stools.   Yes [provider]  levothyroxine (SYNTHROID) 25 MCG tablet TAKE 1 TABLET BY MOUTH DAILY AT 6 AM. Patient taking differently: Take 25 mcg by mouth daily before breakfast. 03/24/21  Yes Raulkar, Clide Deutscher, MD  lubiprostone (AMITIZA) 24 MCG capsule Take by mouth. 03/03/21  Yes [provider]  Melatonin 10 MG TABS Take 1 tablet by mouth at bedtime.   Yes [provider]  methocarbamol (ROBAXIN) 500 MG tablet Take 1 tablet (500 mg total) by mouth every 6 (six) hours as needed for muscle spasms. 10/11/20  Yes Love, Ivan Anchors, PA-C  metoprolol tartrate (LOPRESSOR) 25 MG tablet TAKE (1) TABLET TWICE DAILY.  10/12/20  Yes Raulkar, Clide Deutscher, MD  mometasone-formoterol (DULERA) 100-5 MCG/ACT AERO Inhale 2 puffs into the lungs 2 (two) times daily. 10/11/20  Yes Love, Ivan Anchors, PA-C  MOVANTIK 25 MG TABS tablet Take 25 mg by mouth daily. 09/16/20  Yes [provider]  Multiple Vitamin (MULTIVITAMIN) tablet Take 1 tablet by mouth daily.   Yes [provider]  nitroGLYCERIN (NITROSTAT) 0.4 MG SL tablet Place under the tongue.   Yes [provider]  polyethylene glycol (MIRALAX / GLYCOLAX) 17 g packet Take 17 g by mouth every morning.   Yes [provider]  raloxifene (EVISTA) 60 MG tablet Take 60 mg by mouth daily. 04/13/19  Yes [provider]  tamsulosin (FLOMAX) 0.4 MG CAPS capsule Take 0.4 mg by mouth daily. 04/06/19  Yes [provider]  temazepam (RESTORIL) 30 MG capsule Take 30 mg by mouth at bedtime.   Yes [provider]  traMADol (ULTRAM) 50 MG tablet 1  po q hs prn pain 01/20/21  Yes Meredith Pel, MD  traZODone (DESYREL) 50 MG tablet Take 50 mg by mouth at bedtime as needed for sleep. 06/28/19  Yes [provider]   tretinoin (RETIN-A) 0.05 % cream Apply 1 application topically at bedtime.   Yes [provider]  vitamin C (ASCORBIC ACID) 500 MG tablet Take 500 mg by mouth daily.   Yes [provider]  aspirin 81 MG chewable tablet Chew 1 tablet (81 mg total) by mouth 2 (two) times daily. Patient not taking: No sig reported 10/11/20   Love, Ivan Anchors, PA-C  iron polysaccharides (NIFEREX) 150 MG capsule Take 1 capsule (150 mg total) by mouth daily. Patient not taking: No sig reported 10/11/20   Love, Ivan Anchors, PA-C  Melatonin 10 MG CAPS Take by mouth. Patient not taking: No sig reported    [provider]  sulfamethoxazole-trimethoprim (BACTRIM DS) 800-160 MG tablet Take 1 tablet by mouth 2 (two) times daily. Patient not taking: No sig reported 03/27/21   [provider]    Physical Exam: BP (!) 197/44   Pulse (!) 46   Temp 97.9 F (36.6 C)   Resp 19   Ht 5\' 2"  (1.575 m)   Wt 49.9 kg   SpO2 100%   BMI 20.12 kg/m   General: Elderly female. Awake and alert and oriented x3. No acute cardiopulmonary distress.  HEENT: Normocephalic atraumatic.  Right and left ears normal in appearance.  Pupils equal, round, reactive to light. Extraocular muscles are intact. Sclerae anicteric and noninjected.  Moist mucosal membranes. No mucosal lesions.  Neck: Neck supple without lymphadenopathy. No carotid bruits. No masses palpated.  Cardiovascular: Bradicardic rate with normal S1-S2 sounds. No murmurs, rubs, gallops auscultated. No JVD.  Respiratory: Good respiratory effort with no wheezes, rales, rhonchi. Lungs clear to auscultation bilaterally.  No accessory muscle use. Abdomen: Soft, nontender, nondistended. Active bowel sounds. No masses or hepatosplenomegaly  Skin: 3 small ulcerations on the left lower leg, the largest of which is approximately a centimeter half in diameter with a others being approximately a centimeter.  There is mild erythema surrounding.  Dry, warm to touch.  2+ dorsalis pedis and radial pulses. Musculoskeletal: No calf or leg pain. All major joints not erythematous nontender.  No upper or lower joint deformation.  Good ROM.  No contractures  Psychiatric: Intact judgment and insight. Pleasant and cooperative. Neurologic: No focal neurological deficits. Strength is 5/5 and symmetric in upper and lower extremities.  Cranial nerves  II through XII are grossly intact.           Labs on Admission: I have personally reviewed following labs and imaging studies  CBC: Recent Labs  Lab 04/19/21 1648  WBC 7.0  NEUTROABS 5.4  HGB 11.5*  HCT 34.8*  MCV 95.1  PLT 123XX123   Basic Metabolic Panel: Recent Labs  Lab 04/19/21 1648  NA 143  K 3.4*  CL 110  CO2 26  GLUCOSE 106*  BUN 31*  CREATININE 0.77  CALCIUM 8.7*   GFR: Estimated Creatinine Clearance: 42 mL/min (by C-G formula based on SCr of 0.77 mg/dL). Liver Function Tests: No results for input(s): AST, ALT, ALKPHOS, BILITOT, PROT, ALBUMIN in the last 168 hours. No results for input(s): LIPASE, AMYLASE in the last 168 hours. No results for input(s): AMMONIA in the last 168 hours. Coagulation Profile: No results for input(s): INR, PROTIME in the last 168 hours. Cardiac Enzymes: No results for input(s): CKTOTAL, CKMB, CKMBINDEX, TROPONINI in the last 168 hours. BNP (last 3 results) No results for input(s): PROBNP in the last 8760 hours. HbA1C: No results for input(s): HGBA1C in the last 72 hours. CBG: No results for input(s): GLUCAP in the last 168 hours. Lipid Profile: No results for input(s): CHOL, HDL, LDLCALC, TRIG, CHOLHDL, LDLDIRECT in the last 72 hours. Thyroid Function Tests: No results for input(s): TSH, T4TOTAL, FREET4, T3FREE, THYROIDAB in the last 72 hours. Anemia Panel: No results for input(s): VITAMINB12, FOLATE, FERRITIN, TIBC, IRON, RETICCTPCT in the last 72 hours. Urine analysis:    Component Value Date/Time   COLORURINE AMBER (A) 05/06/2018 1530   APPEARANCEUR  CLOUDY (A) 05/06/2018 1530   LABSPEC 1.014 05/06/2018 1530   PHURINE 5.0 05/06/2018 1530   GLUCOSEU NEGATIVE 05/06/2018 1530   HGBUR MODERATE (A) 05/06/2018 1530   BILIRUBINUR NEGATIVE 05/06/2018 1530   KETONESUR NEGATIVE 05/06/2018 1530   PROTEINUR NEGATIVE 05/06/2018 1530   NITRITE NEGATIVE 05/06/2018 1530   LEUKOCYTESUR MODERATE (A) 05/06/2018 1530   Sepsis Labs: @LABRCNTIP (procalcitonin:4,lacticidven:4) ) Recent Results (from the past 240 hour(s))  Resp Panel by RT-PCR (Flu A&B, Covid) Nasopharyngeal Swab     Status: None   Collection Time: 04/19/21  6:37 PM   Specimen: Nasopharyngeal Swab; Nasopharyngeal(NP) swabs in vial transport medium  Result Value Ref Range Status   SARS Coronavirus 2 by RT PCR NEGATIVE NEGATIVE Final    Comment: (NOTE) SARS-CoV-2 target nucleic acids are NOT DETECTED.  The SARS-CoV-2 RNA is generally detectable in upper respiratory specimens during the acute phase of infection. The lowest concentration of SARS-CoV-2 viral copies this assay can detect is 138 copies/mL. A negative result does not preclude SARS-Cov-2 infection and should not be used as the sole basis for treatment or other patient management decisions. A negative result may occur with  improper specimen collection/handling, submission of specimen other than nasopharyngeal swab, presence of viral mutation(s) within the areas targeted by this assay, and inadequate number of viral copies(<138 copies/mL). A negative result must be combined with clinical observations, patient history, and epidemiological information. The expected result is Negative.  Fact Sheet for Patients:  EntrepreneurPulse.com.au  Fact Sheet for Healthcare Providers:  IncredibleEmployment.be  This test is no t yet approved or cleared by the [place] FDA and  has been authorized for detection and/or diagnosis of SARS-CoV-2 by FDA under an Emergency Use Authorization (EUA). This  EUA will remain  in effect (meaning this test can be used) for the duration of the COVID-19 declaration under Section 564(b)(1) of the  Act, 21 U.S.C.section 360bbb-3(b)(1), unless the authorization is terminated  or revoked sooner.       Influenza A by PCR NEGATIVE NEGATIVE Final   Influenza B by PCR NEGATIVE NEGATIVE Final    Comment: (NOTE) The Xpert Xpress SARS-CoV-2/FLU/RSV plus assay is intended as an aid in the diagnosis of influenza from Nasopharyngeal swab specimens and should not be used as a sole basis for treatment. Nasal washings and aspirates are unacceptable for Xpert Xpress SARS-CoV-2/FLU/RSV testing.  Fact Sheet for Patients: EntrepreneurPulse.com.au  Fact Sheet for Healthcare Providers: IncredibleEmployment.be  This test is not yet approved or cleared by the [place] FDA and has been authorized for detection and/or diagnosis of SARS-CoV-2 by FDA under an Emergency Use Authorization (EUA). This EUA will remain in effect (meaning this test can be used) for the duration of the COVID-19 declaration under Section 564(b)(1) of the Act, 21 U.S.C. section 360bbb-3(b)(1), unless the authorization is terminated or revoked.  Performed at [facility], [address], [place], [place] [address]      Radiological Exams on Admission: DG Chest Port 1 View  Result Date: 04/19/2021 CLINICAL DATA:  Bilateral leg swelling EXAM: PORTABLE CHEST 1 VIEW COMPARISON:  09/27/2020 FINDINGS: Heart size and vascularity within normal limits. Negative for heart failure. Atherosclerotic calcification aortic arch. Spinal cord stimulator unchanged. Bibasilar airspace disease has developed since the prior study. No significant effusion. Underlying COPD IMPRESSION: COPD with bibasilar atelectasis/infiltrate. Electronically Signed   By: Franchot Gallo M.D.   On: 04/19/2021 17:54    EKG: Independently reviewed.  2-1 heart block with right bundle branch  block.  Assessment/Plan: Principal Problem:   Bradycardia Active Problems:   GERD (gastroesophageal reflux disease)   Diastolic dysfunction   CAD (coronary artery disease)/Stent to LAD, 2003 [facility]   Hypothyroidism, postsurgical   COPD (chronic obstructive pulmonary disease) ([place])   Essential hypertension   Lower extremity ulceration ([place])    This patient was discussed with the ED physician, including pertinent vitals, physical exam findings, labs, and imaging.  We also discussed care given by the ED provider.  Bradycardic with 2-1 heart block Will place in stepdown Hold metoprolol Vital signs otherwise stable Hypertension Holding metoprolol Will give patient hydralazine IV.  Hydralazine should also mildly increase the patient's heart rate. Patient is also on benazepril, which we will continue LE ulceration and cellulitis Continue doxycycline Coronary artery disease with heart failure with preserved EF Continue statin and aspirin COPD Continue home inhalers GERD  DVT prophylaxis: Lovenox Consultants: None Code Status: Full code Family Communication: Husband present during interview and exam Disposition Plan: Patient should be able to return home tomorrow following stabilization   Truett Mainland, DO

## 2021-04-19 NOTE — ED Provider Notes (Signed)
Emergency Medicine Provider Triage Evaluation Note  Mckenzie [name] , a 85 y.o. female  was evaluated in triage.  Pt complains of shaking all over  Pt is on metoprolol and did take today   Review of Systems  Positive: weakness Negative: fever  Physical Exam  BP (!) 191/41 (BP Location: Right Arm)   Pulse (!) 37   Temp 97.9 F (36.6 C)   Resp (!) 24   Ht 5\' 2"  (1.575 m)   Wt 49.9 kg   SpO2 96%   BMI 20.12 kg/m  Gen:   Awake shaking,  Resp:  Normal effort  MSK:   Moves extremities without difficulty  Other:   Medical Decision Making  Medically screening exam initiated at 3:33 PM.  Appropriate orders placed.  Mckenzie [name] was informed that the remainder of the evaluation will be completed by another provider, this initial triage assessment does not replace that evaluation, and the importance of remaining in the ED until their evaluation is complete.     Delle Reining, Elson Areas 04/19/21 1536    13/05/22, MD 04/19/21 [phone]

## 2021-04-19 NOTE — ED Provider Notes (Signed)
[facility] EMERGENCY DEPARTMENT Provider Note   CSN: [id number] Arrival date & time: 04/19/21  1504     History Chief Complaint  Patient presents with   Leg Swelling    Mckenzie [name] is a 85 y.o. female.  The history is provided by the patient, the spouse and medical records. No language interpreter was used.   85 year old female significant history of CAD, chronic back pain, COPD, GERD, hypertension, presents ED for concerns of shortness of breath.  History obtained through patient and through husband who is at bedside.  For the past week patient has had some trouble breathing at nighttime as well as having bouts of "shakiness".  She also has several wounds to her left lower extremities that was seen by her PCP several days ago who referred her to the wound care center.  She was seen by the wound care center yesterday and reportedly had it "deroofed" and dressing applied.  No antibiotic was given.  Husband mentioned that patient was told by PCP that she may benefit from outpatient follow-up with cardiology but the next appointment is not until several weeks later which concerns him.  Despite mentioning about "shakiness" patient denies having fever or chills no chest pain no worsening shortness of breath productive cough body aches nausea vomiting.  No recent injury.  She has been compliant with her medication.  Past Medical History:  Diagnosis Date   Aortic valve sclerosis    Mild, echo, April, 2012   ARDS (adult respiratory distress syndrome) ([place]) 2003   with tracheostomy-peritonitis   Arthritis    Back pain    Need for surgery, Dr. Glenna Fellows   CAD (coronary artery disease)    Stent to LAD, 2003 [facility] / nuclear scan April, 201 to artifact / persistent shortness of breath / catheterization Oct 21, 2010... widely patent stent to the mid LAD, no significant obstructive disease, should be low risk    for back surgery   Carotid arterial disease ([place])    Moderate-Dr. Trula Slade following    Chronic back pain    COPD (chronic obstructive pulmonary disease) ([place])    Diastolic dysfunction    Echo, April, 2011   EF 65%   Dysrhythmia    afib   Ejection fraction    EF 65%, echo, September 15, 2010   GERD (gastroesophageal reflux disease)    Hemolytic anemia ([place])    Followed by Dr. Janae Sauce   Hypertension    Hypothyroidism    Left ventricular hypertrophy    EF 65%, echo, April, 2011   MR (mitral regurgitation)     Mild echo, 2012 ///  Mild per Echo 2009,  mild, echo, April, 2011   Myocardial infarction [facility])    Palpitations    Holter, September 16, 2010, PACs and PVCs,one 4 beat run of SVT   Pneumonia 11-2011   Preoperative clearance    Preop clearance for back surgery   Shortness of breath    March, 2012   UTI (urinary tract infection)    Chronic-on Macrodantin    Patient Active Problem List   Diagnosis Date Noted   Acute blood loss anemia    Essential hypertension    Postoperative pain    Femur fracture, left ([place]) 10/03/2020   Lt Hip Hip fracture -s/p Mechanical Fall 09/27/2020   COPD (chronic obstructive pulmonary disease) ([place]) 09/27/2020   HTN (hypertension) 09/27/2020   Irritable bowel syndrome (IBS) 10/04/2019   CAP (community acquired pneumonia) 05/06/2018   Hypersomnolence 05/06/2018  Acute renal failure (ARF) ([place]) 05/06/2018   Dehydration 05/06/2018   Hypotension 05/06/2018   Current chronic use of systemic steroids 07/04/2015   Nontoxic multinodular goiter 05/01/2014   Easy bruisability 08/23/2013   Occlusion and stenosis of carotid artery without mention of cerebral infarction 03/21/2012   Hypothyroidism, postsurgical 02/02/2012   Preoperative clearance    CAD (coronary artery disease)/Stent to LAD, 2003 [facility]    Palpitations    Ejection fraction    MR (mitral regurgitation)    Left ventricular hypertrophy    Mild Aortic valve sclerosis    Carotid arterial disease ([facility])    GERD (gastroesophageal reflux disease)    Hypothyroidism    ARDS (adult  respiratory distress syndrome) ([facility])    Hemolytic anemia ([facility])    Diastolic dysfunction    Shortness of breath    Back pain    Hyperlipidemia 09/10/2009   GERD 09/10/2009    Past Surgical History:  Procedure Laterality Date   APPENDECTOMY     BACK SURGERY     BREAST LUMPECTOMY Left    CATARACT EXTRACTION W/PHACO Right 12/05/2012   Procedure: CATARACT EXTRACTION PHACO AND INTRAOCULAR LENS PLACEMENT ([place]);  Surgeon: Tonny Branch, MD;  Location: [facility];  Service: Ophthalmology;  Laterality: Right;  CDE:15.30   CATARACT EXTRACTION W/PHACO Left 12/15/2012   Procedure: CATARACT EXTRACTION PHACO AND INTRAOCULAR LENS PLACEMENT ([facility]);  Surgeon: Tonny Branch, MD;  Location: [facility];  Service: Ophthalmology;  Laterality: Left;  CDE:22.56   CORONARY ANGIOPLASTY WITH STENT PLACEMENT  2003   INTRAMEDULLARY (IM) NAIL INTERTROCHANTERIC Left 09/27/2020   Procedure: INTRAMEDULLARY (IM) NAIL INTERTROCHANTRIC;  Surgeon: Meredith Pel, MD;  Location: [place];  Service: Orthopedics;  Laterality: Left;  left trochanter   repair of ruptured lumbar discs  12-2011   SPINE SURGERY     lumbar disk removal by Dr. Francesco Runner at [facility]  08/2009   Benign adenomas   TONSILLECTOMY     TRACHEOSTOMY  2003   Following ARDS      OB History   No obstetric history on file.     Family History  Problem Relation Age of Onset   Heart failure Father    Diabetes Son     Social History   Tobacco Use   Smoking status: Former    Packs/day: 1.00    Years: 20.00    Pack years: 20.00    Types: Cigarettes    Quit date: 06/15/2000    Years since quitting: 20.8   Smokeless tobacco: Never  Vaping Use   Vaping Use: Never used  Substance Use Topics   Alcohol use: No   Drug use: No    Home Medications Prior to Admission medications   Medication Sig Start Date End Date Taking? Authorizing Provider  levothyroxine (SYNTHROID) 25 MCG tablet TAKE 1 TABLET BY MOUTH DAILY AT 6 AM. 03/24/21   Raulkar,  Clide Deutscher, MD  acetaminophen (TYLENOL) 325 MG tablet Take 1-2 tablets (325-650 mg total) by mouth every 4 (four) hours as needed for mild pain. 10/11/20   Love, Ivan Anchors, PA-C  aspirin 81 MG chewable tablet Chew 1 tablet (81 mg total) by mouth 2 (two) times daily. 10/11/20   Love, Ivan Anchors, PA-C  atorvastatin (LIPITOR) 10 MG tablet Take 10 mg by mouth daily.  07/22/12   [provider]  benazepril (LOTENSIN) 5 MG tablet Take 5 mg by mouth daily.    [provider]  Calcium Carbonate (CALCIUM 600 PO) Take  1,200 mg by mouth daily.    [provider]  Cholecalciferol (VITAMIN D3) 1000 UNITS CAPS Take 1,000 Units by mouth daily. Take one tab daily    [provider]  conjugated estrogens (PREMARIN) vaginal cream Place 1 Applicatorful vaginally daily.    [provider]  diphenhydrAMINE (BENADRYL) 25 mg capsule Take 25 mg by mouth daily as needed for sleep.    [provider]  esomeprazole (NEXIUM) 40 MG capsule Take 40 mg by mouth daily before breakfast.    [provider]  HYDROcodone-acetaminophen (NORCO) 10-325 MG tablet Take 1 tablet by mouth every 4 (four) hours as needed for severe pain. 10/11/20   Love, Ivan Anchors, PA-C  hyoscyamine (LEVSIN SL) 0.125 MG SL tablet Take 0.125 mg by mouth every 6 (six) hours as needed for cramping or diarrhea or loose stools.    [provider]  iron polysaccharides (NIFEREX) 150 MG capsule Take 1 capsule (150 mg total) by mouth daily. 10/11/20   Love, Ivan Anchors, PA-C  Melatonin 10 MG TABS Take 1 tablet by mouth at bedtime.    [provider]  methocarbamol (ROBAXIN) 500 MG tablet Take 1 tablet (500 mg total) by mouth every 6 (six) hours as needed for muscle spasms. 10/11/20   Love, Ivan Anchors, PA-C  methocarbamol (ROBAXIN) 500 MG tablet 1 po q 8 hrs prn 10/23/20   Meredith Pel, MD  metoprolol tartrate (LOPRESSOR) 25 MG tablet TAKE (1) TABLET TWICE DAILY. 10/12/20   Raulkar, Clide Deutscher, MD   mometasone-formoterol (DULERA) 100-5 MCG/ACT AERO Inhale 2 puffs into the lungs 2 (two) times daily. 10/11/20   Love, Ivan Anchors, PA-C  MOVANTIK 25 MG TABS tablet Take 25 mg by mouth daily. 09/16/20   [provider]  Multiple Vitamin (MULTIVITAMIN) tablet Take 1 tablet by mouth daily.    [provider]  nitroGLYCERIN (NITROSTAT) 0.4 MG SL tablet Place one tablet under tongue every 5 minutes up to 3 doses as needed for chest pain. No more than 3 doses over a 15 minute period. 12/14/13   Carlena Bjornstad, MD  polyethylene glycol (MIRALAX / GLYCOLAX) 17 g packet Take 17 g by mouth every morning.    [provider]  raloxifene (EVISTA) 60 MG tablet Take 60 mg by mouth daily. 04/13/19   [provider]  tamsulosin (FLOMAX) 0.4 MG CAPS capsule Take 0.4 mg by mouth daily. 04/06/19   [provider]  temazepam (RESTORIL) 30 MG capsule Take 30 mg by mouth at bedtime.    [provider]  traMADol Veatrice Bourbon) 50 MG tablet 1  po q hs prn pain 01/20/21   Meredith Pel, MD  traZODone (DESYREL) 50 MG tablet Take 50 mg by mouth at bedtime as needed for sleep. 06/28/19   [provider]  tretinoin (RETIN-A) 0.05 % cream Apply 1 application topically at bedtime.    [provider]    Allergies    Tetracycline  Review of Systems   Review of Systems  All other systems reviewed and are negative.  Physical Exam Updated Vital Signs BP (!) 191/41 (BP Location: Right Arm)   Pulse (!) 37   Temp 97.9 F (36.6 C)   Resp (!) 24   Ht 5\' 2"  (1.575 m)   Wt 49.9 kg   SpO2 96%   BMI 20.12 kg/m   Physical Exam Vitals and nursing note reviewed.  Constitutional:      General: She is not in acute distress.  Appearance: She is well-developed.     Comments: Elderly female appears to be in no acute discomfort.  HENT:     Head: Atraumatic.  Eyes:     Conjunctiva/sclera: Conjunctivae normal.  Cardiovascular:     Rate and Rhythm: Bradycardia  present.     Heart sounds: Murmur heard.  Pulmonary:     Effort: Pulmonary effort is normal.     Breath sounds: No wheezing, rhonchi or rales.  Abdominal:     Palpations: Abdomen is soft.     Tenderness: There is no abdominal tenderness.  Musculoskeletal:     Cervical back: Neck supple.     Comments: Ace wrap noted to left lower extremities.  When Ace wrap removed, surrounding skin erythema and edema noted to the anterior shin with 3 separate shallow ulceration with mild discharge noted.  Intact dorsalis pedis pulse.  Skin:    Findings: No rash.  Neurological:     Mental Status: She is alert. Mental status is at baseline.  Psychiatric:        Mood and Affect: Mood normal.    ED Results / Procedures / Treatments   Labs (all labs ordered are listed, but only abnormal results are displayed) Labs Reviewed  BASIC METABOLIC PANEL - Abnormal; Notable for the following components:      Result Value   Potassium 3.4 (*)    Glucose, Bld 106 (*)    BUN 31 (*)    Calcium 8.7 (*)    All other components within normal limits  BRAIN NATRIURETIC PEPTIDE - Abnormal; Notable for the following components:   B Natriuretic Peptide 863.0 (*)    All other components within normal limits  CBC WITH DIFFERENTIAL/PLATELET - Abnormal; Notable for the following components:   RBC 3.66 (*)    Hemoglobin 11.5 (*)    HCT 34.8 (*)    All other components within normal limits  RESP PANEL BY RT-PCR (FLU A&B, COVID) [id number]    EKG EKG Interpretation  Date/Time:  Saturday April 19 2021 16:14:14 EDT Ventricular Rate:  39 PR Interval:  132 QRS Duration: 122 QT Interval:  517 QTC Calculation: 417 R Axis:   57 Text Interpretation: Sinus bradycardia Atrial premature complex Right bundle branch block Abnrm T, consider ischemia, anterolateral lds Since last tracing rate slower Confirmed by Jacalyn Lefevre [phone]) on 04/19/2021 4:34:43 PM  Radiology DG Chest Port 1 View  Result Date: 04/19/2021 CLINICAL  DATA:  Bilateral leg swelling EXAM: PORTABLE CHEST 1 VIEW COMPARISON:  09/27/2020 FINDINGS: Heart size and vascularity within normal limits. Negative for heart failure. Atherosclerotic calcification aortic arch. Spinal cord stimulator unchanged. Bibasilar airspace disease has developed since the prior study. No significant effusion. Underlying COPD IMPRESSION: COPD with bibasilar atelectasis/infiltrate. Electronically Signed   By: Marlan Palau M.D.   On: 04/19/2021 17:54    Procedures Procedures   Medications Ordered in ED Medications  doxycycline (VIBRA-TABS) tablet 100 mg (has no administration in time range)  atropine 1 MG/10ML injection 0.5 mg (0.5 mg Intravenous Given 04/19/21 1634)  atropine 1 MG/10ML injection 0.5 mg (0.5 mg Intravenous Given 04/19/21 1649)    ED Course  I have reviewed the triage vital signs and the nursing notes.  Pertinent labs & imaging results that were available during my care of the patient were reviewed by me and considered in my medical decision making (see chart for details).    MDM Rules/Calculators/A&P  BP (!) 191/41 (BP Location: Right Arm)   Pulse (!) 37   Temp 97.9 F (36.6 C)   Resp (!) 24   Ht 5\' 2"  (1.575 m)   Wt 49.9 kg   SpO2 96%   BMI 20.12 kg/m   Final Clinical Impression(s) / ED Diagnoses Final diagnoses:  Bradycardia, drug induced  Hypertensive urgency    Rx / DC Orders ED Discharge Orders     None      4:04 PM Patient brought here for evaluation of shortness of breath for the past week as well as wounds to her left lower extremities that was recently been seen evaluated by the wound care center.  Her wound to left lower extremity is concerning for potential cellulitis as there is erythema and edema appreciated.  She has multiple shallow ulcerations without any evidence of abscess.  She has intact pedal pulses.  Patient was found to be bradycardic with a heart rate of 37.  She is asymptomatic  from that standpoint, She is on a beta blocker.  Her current BP is 191/41.  Care discussed with Dr. Gilford Raid.  Will give 0.5mg  of atropine to aid with heart rate.  Will reassess.    6:09 PM Patient does have an elevated BNP of 863 along with a chest x-ray that show evidence of COPD with bibasilar atelectasis/infiltrate.  No significant effusion noted.  Since patient has trace leg swelling as well as having elevated BNP, with CXR showing atelectasis/infiltrate, plan to treat with abx and possibly a short course of Lasix.  Pt's heart rate have been in the 40s despite receiving 1mg  of atropine, and she remains hypertensive.  Will consult cardiology for recommendation. Pt may have tachy/brady syndrome.  Care discussed with Dr. Gilford Raid.   Pt currently on benazepril 5mg , metoprolol 25mg   6:42 PM Appreciate consultation from on-call cardiologist, Dr. Zigmund Daniel, who did review patient's EKG and noted a 2-1 AV block.  He suspects bradycardia is likely secondary to metoprolol and he recommend patient to be admitted for at least 24 hours observation.  Since patient is hypertensive, no additional treatment is indicated.  However if she becomes hypotensive and bradycardic, will consider glucagon as the antidote.  Plan to consult hospitalist for further care.  6:55 PM Appreciate consultation from [facility] Dr. Nehemiah Settle who agrees to admit pt for observation.     Domenic Moras, PA-C 04/19/21 1857    Isla Pence, MD 04/19/21 [phone]

## 2021-04-20 ENCOUNTER — Observation Stay (HOSPITAL_COMMUNITY): Payer: Medicare Other

## 2021-04-20 DIAGNOSIS — R001 Bradycardia, unspecified: Secondary | ICD-10-CM | POA: Diagnosis not present

## 2021-04-20 DIAGNOSIS — G459 Transient cerebral ischemic attack, unspecified: Secondary | ICD-10-CM

## 2021-04-20 DIAGNOSIS — R2241 Localized swelling, mass and lump, right lower limb: Secondary | ICD-10-CM | POA: Diagnosis not present

## 2021-04-20 DIAGNOSIS — R410 Disorientation, unspecified: Secondary | ICD-10-CM | POA: Diagnosis not present

## 2021-04-20 LAB — BASIC METABOLIC PANEL
Anion gap: 9 (ref 5–15)
BUN: 29 mg/dL — ABNORMAL HIGH (ref 8–23)
CO2: 22 mmol/L (ref 22–32)
Calcium: 8.7 mg/dL — ABNORMAL LOW (ref 8.9–10.3)
Chloride: 112 mmol/L — ABNORMAL HIGH (ref 98–111)
Creatinine, Ser: 0.69 mg/dL (ref 0.44–1.00)
GFR, Estimated: >60 mL/min (ref >60)
Glucose, Bld: 118 mg/dL — ABNORMAL HIGH (ref 70–99)
Potassium: 3.3 mmol/L — ABNORMAL LOW (ref 3.5–5.1)
Sodium: 143 mmol/L (ref 135–145)

## 2021-04-20 LAB — CBC
HCT: 35.7 % — ABNORMAL LOW (ref 36.0–46.0)
Hemoglobin: 11.3 g/dL — ABNORMAL LOW (ref 12.0–15.0)
MCH: 30.1 pg (ref 26.0–34.0)
MCHC: 31.7 g/dL (ref 30.0–36.0)
MCV: 94.9 fL (ref 80.0–100.0)
Platelets: 197 10*3/uL (ref 150–400)
RBC: 3.76 MIL/uL — ABNORMAL LOW (ref 3.87–5.11)
RDW: 13.3 % (ref 11.5–15.5)
WBC: 8.9 10*3/uL (ref 4.0–10.5)
nRBC: 0 % (ref 0.0–0.2)

## 2021-04-20 LAB — TSH: TSH: 2.44 u[IU]/mL (ref 0.350–4.500)

## 2021-04-20 LAB — MRSA NEXT GEN BY PCR, NASAL: MRSA by PCR Next Gen: NOT DETECTED

## 2021-04-20 MED ORDER — POTASSIUM CHLORIDE CRYS ER 20 MEQ PO TBCR
40.0000 meq | EXTENDED_RELEASE_TABLET | Freq: Once | ORAL | Status: AC
  Administered 2021-04-20: 40 meq via ORAL
  Filled 2021-04-20: qty 2

## 2021-04-20 MED ORDER — ONDANSETRON HCL 4 MG/2ML IJ SOLN
4.0000 mg | Freq: Four times a day (QID) | INTRAMUSCULAR | Status: DC | PRN
  Administered 2021-04-20: 4 mg via INTRAVENOUS
  Filled 2021-04-20: qty 2

## 2021-04-20 MED ORDER — CHLORHEXIDINE GLUCONATE CLOTH 2 % EX PADS
6.0000 | MEDICATED_PAD | Freq: Every day | CUTANEOUS | Status: DC
  Administered 2021-04-20 – 2021-04-24 (×4): 6 via TOPICAL

## 2021-04-20 MED ORDER — ASPIRIN 325 MG PO TABS
325.0000 mg | ORAL_TABLET | Freq: Once | ORAL | Status: AC
  Administered 2021-04-20: 325 mg via ORAL
  Filled 2021-04-20: qty 1

## 2021-04-20 NOTE — Progress Notes (Signed)
CRITICAL CARE NOTE  RN called due to husband's concern of change in pt's status from baseline.  Patient was reported to have right-sided facial droop and slurred speech which was new since admission.  Per RN, daytime nurse reported that she has been like that for most of the day. At bedside, patient's symptoms have resolved and she has regained her normal speech. CT of head without contrast was ordered and aspirin 325 mg x 1 was given.  Physical Exam  Gen:- Awake, alert and oriented x3 HEENT:- [place].AT, No sclera icterus Neck-Supple Neck,No JVD,.  Lungs-  CTAB  CV- S1, S2 normal Abd-  +ve B.Sounds, Abd Soft, No tenderness,    Extremity/Skin:-RLE swelling. Psych-affect is appropriate, oriented x3 Neuro-normal finger-to-nose movements.  Patient had difficulty with heel-to-shin movements.  No new focal deficits, no tremors  Assessment and plan: Facial droop and slurred speech possibly secondary to TIA, rule out ischemic stroke CT head without contrast showed chronic atrophic and ischemic changes without acute abnormality Bilateral carotid ultrasound in the morning Echocardiogram in the morning MRI of brain without contrast in the morning Continue aspirin and statin Continue fall precautions and neuro checks Lipid panel and hemoglobin A1c will be checked Bedside swallow eval by nursing prior to diet Consider tele neurology consult status post imaging studies   Right lower extremity swelling rule out DVT Lower extremity ultrasound will be done in the morning  Critical time:    Critical care personally provided  managing the patient due to high probability of clinically significant and life threatening deterioration. This critical care time included obtaining a history; examining the patient, pulse oximetry; ordering and review of studies; arranging urgent treatment with development of a management plan; evaluation of patient's response of treatment; frequent reassessment; and  discussions with other providers.  This critical care time was performed to assess and manage the high probability of imminent and life threatening deterioration that could result in multi-organ failure.

## 2021-04-20 NOTE — Care Management Obs Status (Signed)
MEDICARE OBSERVATION STATUS NOTIFICATION   Patient Details  Name: Mckenzie [name] MRN: [id number] Date of Birth: [birth year]   Medicare Observation Status Notification Given:  Yes    Barry Brunner, LCSW 04/20/2021, 4:49 PM

## 2021-04-20 NOTE — Consult Note (Signed)
WOC Nurse Consult Note: Reason for Consult:two areas of chronic, nonhealing wounds on LLE.  Seen by outpatient Wound care center on Thursday, 11/3. Wound type: Trauma vs venous insufficiency Pressure Injury POA: N/A Measurement: proximal wound measures 1cm x 0.5cm x 0.2cm (full thickness) Distal wound measures 0.5cm round x 0.1cm (partial thickness)  Wound bed: red, moist Drainage (amount, consistency, odor) small serous Periwound: intact with mild periwound erythema, no induration or warmth. No edema. Dressing procedure/placement/frequency: I will implement a conservative POC using a NS cleanse, pat dry followed by daily application of an antimicrobial nonadherent (xeroform) gauze to both wounds topped with dry dressings. A single ABD pad is to be used to cover both wounds and secured with a Kerlix roll gauze/paper tape.  Mild compression is to be applied via an ACE bandage wrapped in a manner similar to the Kerlix roll gauze. Heels are to be floated for PI prevention.  The LE dressings are to be changed once daily while in house.  The patient is to follow up with the outpatient wound care center as was previously instructed post discharge.  WOC nursing team will not follow, but will remain available to this patient, the nursing and medical teams.  Please re-consult if needed. Thanks, Ladona Mow, MSN, RN, GNP, [place]  Pager# [phone]

## 2021-04-20 NOTE — Progress Notes (Signed)
PROGRESS NOTE    Mckenzie [name]  [id number] DOB: 08-30-1935 DOA: 04/19/2021 PCP: Rosalee Kaufman, PA-C   Brief Narrative:   Mckenzie [name] is a 85 y.o. female with a history of coronary artery disease with history of MI, COPD, HFpEF, GERD, hypothyroidism, hypertension.  Patient seen for leg swelling due to chronic leg ulcerations on her left lower leg.  In the process of evaluation, she was noted to have a heart rate in the 30-40 bpm range and had received atropine in the ED.  She does take metoprolol on a regular basis which has been withheld.  She is on doxycycline for her cellulitis.  Blood pressure remained stable and she is asymptomatic.  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  Hypokalemia -Replete and reevaluate in a.m.  Hypertension -Continue lisinopril and hydralazine  Lower extremity wound with cellulitis -Continue doxycycline  CAD with HFpEF -Continue statin and aspirin -Holding beta-blocker for bradycardia  COPD-asymptomatic -Continue home inhalers  Hypothyroidism -Continue levothyroxine -TSH 2.44  GERD -PPI   DVT prophylaxis: Lovenox Code Status: Full Family Communication: None at bedside Disposition Plan:  Status is: Observation  The patient will require care spanning > 2 midnights and should be moved to inpatient because: Continued bradycardia requiring close monitoring and further evaluation.   Consultants:  Cardiology  Procedures:  See  below  Antimicrobials:  Anti-infectives (From admission, onward)    Start     Dose/Rate Route Frequency Ordered Stop   04/20/21 1000  doxycycline (VIBRA-TABS) tablet 100 mg        100 mg Oral Every 12 hours 04/19/21 2350     04/19/21 1900  doxycycline (VIBRA-TABS) tablet 100 mg        100 mg Oral  Once 04/19/21 1853 04/19/21 1936       Subjective: Patient seen and evaluated today with no new acute complaints or concerns. No acute concerns or events noted overnight.  She is hard of hearing.  She denies any chest pain, palpitations, shortness of breath, lightheadedness, or dizziness.  Objective: Vitals:   04/20/21 0552 04/20/21 0750 04/20/21 1023 04/20/21 1100  BP: (!) 183/47   (!) 164/32  Pulse:    (!) 45  Resp:    20  Temp:  97.9 F (36.6 C)    TempSrc:  Oral    SpO2:   99% 98%  Weight:      Height:       No intake or output data in the 24 hours ending 04/20/21 1129 Filed Weights   04/19/21 1516 04/20/21 0025 04/20/21 0500  Weight: 49.9 kg 46.5 kg 46.5 kg    Examination:  General exam: Appears calm and comfortable  Respiratory system: Clear to auscultation. Respiratory effort normal. Cardiovascular system: S1 & S2 heard, RRR.  Bradycardic. Gastrointestinal system: Abdomen is soft Central nervous system: Alert and awake Extremities: No edema Skin: No significant lesions noted Psychiatry: Flat affect.    Data Reviewed: I have personally reviewed following labs and imaging studies  CBC: Recent Labs  Lab 04/19/21 1648 04/20/21 0141  WBC 7.0  8.9  NEUTROABS 5.4  --   HGB 11.5* 11.3*  HCT 34.8* 35.7*  MCV 95.1 94.9  PLT 177 197   Basic Metabolic Panel: Recent Labs  Lab 04/19/21 1648 04/20/21 0141  NA 143 143  K 3.4* 3.3*  CL 110 112*  CO2 26 22  GLUCOSE 106* 118*  BUN 31* 29*  CREATININE 0.77 0.69  CALCIUM 8.7* 8.7*   GFR: Estimated Creatinine Clearance: 39.1 mL/min (by C-G formula based on SCr of 0.69 mg/dL). Liver Function Tests: No results  for input(s): AST, ALT, ALKPHOS, BILITOT, PROT, ALBUMIN in the last 168 hours. No results for input(s): LIPASE, AMYLASE in the last 168 hours. No results for input(s): AMMONIA in the last 168 hours. Coagulation Profile: No results for input(s): INR, PROTIME in the last 168 hours. Cardiac Enzymes: No results for input(s): CKTOTAL, CKMB, CKMBINDEX, TROPONINI in the last 168 hours. BNP (last 3 results) No results for input(s): PROBNP in the last 8760 hours. HbA1C: No results for input(s): HGBA1C in the last 72 hours. CBG: No results for input(s): GLUCAP in the last 168 hours. Lipid Profile: No results for input(s): CHOL, HDL, LDLCALC, TRIG, CHOLHDL, LDLDIRECT in the last 72 hours. Thyroid Function Tests: Recent Labs    04/20/21 0716  TSH 2.440   Anemia Panel: No results for input(s): VITAMINB12, FOLATE, FERRITIN, TIBC, IRON, RETICCTPCT in the last 72 hours. Sepsis Labs: No results for input(s): PROCALCITON, LATICACIDVEN in the last 168 hours.  Recent Results (from the past 240 hour(s))  Resp Panel by RT-PCR (Flu A&B, Covid) Nasopharyngeal Swab     Status: None   Collection Time: 04/19/21  6:37 PM   Specimen: Nasopharyngeal Swab; Nasopharyngeal(NP) swabs in vial transport medium  Result Value Ref Range Status   SARS Coronavirus 2 by RT PCR NEGATIVE NEGATIVE Final    Comment: (NOTE) SARS-CoV-2 target nucleic acids are NOT DETECTED.  The SARS-CoV-2 RNA is generally detectable in upper respiratory specimens during the acute phase of infection. The lowest concentration of SARS-CoV-2 viral copies this assay can detect is 138 copies/mL. A negative result does not preclude SARS-Cov-2 infection and should not be used as the sole basis for treatment or other patient management decisions. A negative result may occur with  improper specimen collection/handling, submission of specimen other than nasopharyngeal swab, presence of viral mutation(s) within the areas targeted by this assay, and  inadequate number of viral copies(<138 copies/mL). A negative result must be combined with clinical observations, patient history, and epidemiological information. The expected result is Negative.  Fact Sheet for Patients:  BloggerCourse.com  Fact Sheet for Healthcare Providers:  SeriousBroker.it  This test is no t yet approved or cleared by the [place] FDA and  has been authorized for detection and/or diagnosis of SARS-CoV-2 by FDA under an Emergency Use Authorization (EUA). This EUA will remain  in effect (meaning this test can be used) for the duration of the COVID-19 declaration under Section 564(b)(1) of the Act, 21 U.S.C.section 360bbb-3(b)(1), unless the authorization is terminated  or revoked sooner.       Influenza A by PCR NEGATIVE NEGATIVE Final   Influenza B by PCR NEGATIVE NEGATIVE Final    Comment: (NOTE) The Xpert Xpress SARS-CoV-2/FLU/RSV plus assay is intended as an aid in the diagnosis of influenza from Nasopharyngeal swab specimens and should not be used as a sole basis for treatment. Nasal washings and aspirates are unacceptable for Xpert Xpress SARS-CoV-2/FLU/RSV testing.  Fact Sheet for Patients: BloggerCourse.com  Fact Sheet for Healthcare  Providers: IncredibleEmployment.be  This test is not yet approved or cleared by the [place] and has been authorized for detection and/or diagnosis of SARS-CoV-2 by FDA under an Emergency Use Authorization (EUA). This EUA will remain in effect (meaning this test can be used) for the duration of the COVID-19 declaration under Section 564(b)(1) of the Act, 21 U.S.C. section 360bbb-3(b)(1), unless the authorization is terminated or revoked.  Performed at [facility], [address], [place], [place] [address]   MRSA Next Gen by PCR, Nasal     Status: None   Collection Time: 04/20/21 12:44 AM   Specimen: Nasal  Mucosa; Nasal Swab  Result Value Ref Range Status   MRSA by PCR Next Gen NOT DETECTED NOT DETECTED Final    Comment: (NOTE) The GeneXpert MRSA Assay (FDA approved for NASAL specimens only), is one component of a comprehensive MRSA colonization surveillance program. It is not intended to diagnose MRSA infection nor to guide or monitor treatment for MRSA infections. Test performance is not FDA approved in patients less than 4 years old. Performed at [facility], [address], [place], [place] [address]          Radiology Studies: [facility] 1 View  Result Date: 04/19/2021 CLINICAL DATA:  Bilateral leg swelling EXAM: PORTABLE CHEST 1 VIEW COMPARISON:  09/27/2020 FINDINGS: Heart size and vascularity within normal limits. Negative for heart failure. Atherosclerotic calcification aortic arch. Spinal cord stimulator unchanged. Bibasilar airspace disease has developed since the prior study. No significant effusion. Underlying COPD IMPRESSION: COPD with bibasilar atelectasis/infiltrate. Electronically Signed   By: Franchot Gallo M.D.   On: 04/19/2021 17:54        Scheduled Meds:  aspirin  81 mg Oral BID   atorvastatin  10 mg Oral Daily   benazepril  5 mg Oral Daily   Chlorhexidine Gluconate Cloth  6 each Topical Daily   doxycycline  100 mg Oral Q12H   enoxaparin (LOVENOX) injection  40 mg Subcutaneous Q24H   levothyroxine  25 mcg Oral Q0600   lubiprostone  24 mcg Oral Q breakfast   melatonin  9 mg Oral QHS   mometasone-formoterol  2 puff Inhalation BID   naloxegol oxalate  25 mg Oral Daily   pantoprazole  40 mg Oral Daily   polyethylene glycol  17 g Oral [id number]   raloxifene  60 mg Oral Daily   temazepam  30 mg Oral QHS     LOS: 0 days    Time spent: 35 minutes    Dorette Hartel Darleen Crocker, DO [facility]  If 7PM-7AM, please contact night-coverage [email or web address] 04/20/2021, 11:29 AM

## 2021-04-21 ENCOUNTER — Observation Stay (HOSPITAL_COMMUNITY): Payer: Medicare Other

## 2021-04-21 DIAGNOSIS — I441 Atrioventricular block, second degree: Secondary | ICD-10-CM | POA: Diagnosis not present

## 2021-04-21 DIAGNOSIS — Z9981 Dependence on supplemental oxygen: Secondary | ICD-10-CM | POA: Diagnosis not present

## 2021-04-21 DIAGNOSIS — I4510 Unspecified right bundle-branch block: Secondary | ICD-10-CM | POA: Diagnosis present

## 2021-04-21 DIAGNOSIS — M79605 Pain in left leg: Secondary | ICD-10-CM | POA: Diagnosis not present

## 2021-04-21 DIAGNOSIS — I5032 Chronic diastolic (congestive) heart failure: Secondary | ICD-10-CM | POA: Diagnosis present

## 2021-04-21 DIAGNOSIS — Z87891 Personal history of nicotine dependence: Secondary | ICD-10-CM | POA: Diagnosis not present

## 2021-04-21 DIAGNOSIS — R339 Retention of urine, unspecified: Secondary | ICD-10-CM | POA: Diagnosis not present

## 2021-04-21 DIAGNOSIS — S81802A Unspecified open wound, left lower leg, initial encounter: Secondary | ICD-10-CM | POA: Insufficient documentation

## 2021-04-21 DIAGNOSIS — R29810 Facial weakness: Secondary | ICD-10-CM | POA: Diagnosis present

## 2021-04-21 DIAGNOSIS — L97222 Non-pressure chronic ulcer of left calf with fat layer exposed: Secondary | ICD-10-CM | POA: Insufficient documentation

## 2021-04-21 DIAGNOSIS — R001 Bradycardia, unspecified: Secondary | ICD-10-CM | POA: Diagnosis not present

## 2021-04-21 DIAGNOSIS — E876 Hypokalemia: Secondary | ICD-10-CM | POA: Diagnosis present

## 2021-04-21 DIAGNOSIS — Z950 Presence of cardiac pacemaker: Secondary | ICD-10-CM | POA: Diagnosis not present

## 2021-04-21 DIAGNOSIS — I252 Old myocardial infarction: Secondary | ICD-10-CM | POA: Diagnosis not present

## 2021-04-21 DIAGNOSIS — R600 Localized edema: Secondary | ICD-10-CM | POA: Diagnosis not present

## 2021-04-21 DIAGNOSIS — Z466 Encounter for fitting and adjustment of urinary device: Secondary | ICD-10-CM | POA: Diagnosis not present

## 2021-04-21 DIAGNOSIS — I6782 Cerebral ischemia: Secondary | ICD-10-CM | POA: Diagnosis not present

## 2021-04-21 DIAGNOSIS — Z8249 Family history of ischemic heart disease and other diseases of the circulatory system: Secondary | ICD-10-CM | POA: Diagnosis not present

## 2021-04-21 DIAGNOSIS — R29818 Other symptoms and signs involving the nervous system: Secondary | ICD-10-CM | POA: Diagnosis not present

## 2021-04-21 DIAGNOSIS — N323 Diverticulum of bladder: Secondary | ICD-10-CM | POA: Diagnosis not present

## 2021-04-21 DIAGNOSIS — R64 Cachexia: Secondary | ICD-10-CM | POA: Diagnosis not present

## 2021-04-21 DIAGNOSIS — I4430 Unspecified atrioventricular block: Secondary | ICD-10-CM

## 2021-04-21 DIAGNOSIS — I6523 Occlusion and stenosis of bilateral carotid arteries: Secondary | ICD-10-CM | POA: Diagnosis not present

## 2021-04-21 DIAGNOSIS — J449 Chronic obstructive pulmonary disease, unspecified: Secondary | ICD-10-CM | POA: Diagnosis not present

## 2021-04-21 DIAGNOSIS — L97829 Non-pressure chronic ulcer of other part of left lower leg with unspecified severity: Secondary | ICD-10-CM | POA: Diagnosis not present

## 2021-04-21 DIAGNOSIS — I2510 Atherosclerotic heart disease of native coronary artery without angina pectoris: Secondary | ICD-10-CM | POA: Diagnosis present

## 2021-04-21 DIAGNOSIS — S72002D Fracture of unspecified part of neck of left femur, subsequent encounter for closed fracture with routine healing: Secondary | ICD-10-CM | POA: Diagnosis not present

## 2021-04-21 DIAGNOSIS — Z20822 Contact with and (suspected) exposure to covid-19: Secondary | ICD-10-CM | POA: Diagnosis not present

## 2021-04-21 DIAGNOSIS — Z79890 Hormone replacement therapy (postmenopausal): Secondary | ICD-10-CM | POA: Diagnosis not present

## 2021-04-21 DIAGNOSIS — Z955 Presence of coronary angioplasty implant and graft: Secondary | ICD-10-CM | POA: Diagnosis not present

## 2021-04-21 DIAGNOSIS — R4781 Slurred speech: Secondary | ICD-10-CM | POA: Diagnosis present

## 2021-04-21 DIAGNOSIS — I83022 Varicose veins of left lower extremity with ulcer of calf: Secondary | ICD-10-CM | POA: Insufficient documentation

## 2021-04-21 DIAGNOSIS — Z7982 Long term (current) use of aspirin: Secondary | ICD-10-CM | POA: Diagnosis not present

## 2021-04-21 DIAGNOSIS — Z8673 Personal history of transient ischemic attack (TIA), and cerebral infarction without residual deficits: Secondary | ICD-10-CM | POA: Diagnosis not present

## 2021-04-21 DIAGNOSIS — Z79899 Other long term (current) drug therapy: Secondary | ICD-10-CM | POA: Diagnosis not present

## 2021-04-21 DIAGNOSIS — N320 Bladder-neck obstruction: Secondary | ICD-10-CM | POA: Diagnosis not present

## 2021-04-21 DIAGNOSIS — Z9181 History of falling: Secondary | ICD-10-CM | POA: Diagnosis not present

## 2021-04-21 DIAGNOSIS — M5416 Radiculopathy, lumbar region: Secondary | ICD-10-CM | POA: Diagnosis not present

## 2021-04-21 DIAGNOSIS — K5900 Constipation, unspecified: Secondary | ICD-10-CM | POA: Diagnosis not present

## 2021-04-21 DIAGNOSIS — R4182 Altered mental status, unspecified: Secondary | ICD-10-CM | POA: Diagnosis not present

## 2021-04-21 DIAGNOSIS — I10 Essential (primary) hypertension: Secondary | ICD-10-CM

## 2021-04-21 DIAGNOSIS — M7989 Other specified soft tissue disorders: Secondary | ICD-10-CM | POA: Diagnosis not present

## 2021-04-21 DIAGNOSIS — E785 Hyperlipidemia, unspecified: Secondary | ICD-10-CM | POA: Diagnosis not present

## 2021-04-21 DIAGNOSIS — E890 Postprocedural hypothyroidism: Secondary | ICD-10-CM | POA: Diagnosis not present

## 2021-04-21 DIAGNOSIS — I160 Hypertensive urgency: Secondary | ICD-10-CM | POA: Diagnosis not present

## 2021-04-21 DIAGNOSIS — K219 Gastro-esophageal reflux disease without esophagitis: Secondary | ICD-10-CM | POA: Diagnosis present

## 2021-04-21 DIAGNOSIS — M8448XA Pathological fracture, other site, initial encounter for fracture: Secondary | ICD-10-CM | POA: Diagnosis not present

## 2021-04-21 DIAGNOSIS — Z681 Body mass index (BMI) 19 or less, adult: Secondary | ICD-10-CM | POA: Diagnosis not present

## 2021-04-21 DIAGNOSIS — L03116 Cellulitis of left lower limb: Secondary | ICD-10-CM | POA: Diagnosis present

## 2021-04-21 DIAGNOSIS — M810 Age-related osteoporosis without current pathological fracture: Secondary | ICD-10-CM | POA: Diagnosis not present

## 2021-04-21 DIAGNOSIS — Z833 Family history of diabetes mellitus: Secondary | ICD-10-CM | POA: Diagnosis not present

## 2021-04-21 DIAGNOSIS — I110 Hypertensive heart disease with heart failure: Secondary | ICD-10-CM | POA: Diagnosis not present

## 2021-04-21 DIAGNOSIS — M47812 Spondylosis without myelopathy or radiculopathy, cervical region: Secondary | ICD-10-CM | POA: Diagnosis not present

## 2021-04-21 DIAGNOSIS — G459 Transient cerebral ischemic attack, unspecified: Secondary | ICD-10-CM | POA: Diagnosis not present

## 2021-04-21 LAB — HEMOGLOBIN A1C
Hgb A1c MFr Bld: 5.3 % (ref 4.8–5.6)
Mean Plasma Glucose: 105.41 mg/dL

## 2021-04-21 LAB — CBC
HCT: 37.9 % (ref 36.0–46.0)
Hemoglobin: 11.5 g/dL — ABNORMAL LOW (ref 12.0–15.0)
MCH: 30.1 pg (ref 26.0–34.0)
MCHC: 30.3 g/dL (ref 30.0–36.0)
MCV: 99.2 fL (ref 80.0–100.0)
Platelets: 188 10*3/uL (ref 150–400)
RBC: 3.82 MIL/uL — ABNORMAL LOW (ref 3.87–5.11)
RDW: 13.7 % (ref 11.5–15.5)
WBC: 8.5 10*3/uL (ref 4.0–10.5)
nRBC: 0 % (ref 0.0–0.2)

## 2021-04-21 LAB — ECHOCARDIOGRAM COMPLETE
AR max vel: 1.71 cm2
AV Area VTI: 1.86 cm2
AV Area mean vel: 1.73 cm2
AV Mean grad: 18 mmHg
AV Peak grad: 33.9 mmHg
Ao pk vel: 2.91 m/s
Area-P 1/2: 2.37 cm2
Height: 62 in
MV VTI: 2.58 cm2
S' Lateral: 2.9 cm
Weight: 1640.22 oz

## 2021-04-21 LAB — BASIC METABOLIC PANEL
Anion gap: 7 (ref 5–15)
BUN: 35 mg/dL — ABNORMAL HIGH (ref 8–23)
CO2: 22 mmol/L (ref 22–32)
Calcium: 8.1 mg/dL — ABNORMAL LOW (ref 8.9–10.3)
Chloride: 111 mmol/L (ref 98–111)
Creatinine, Ser: 0.99 mg/dL (ref 0.44–1.00)
GFR, Estimated: 57 mL/min — ABNORMAL LOW (ref >60)
Glucose, Bld: 95 mg/dL (ref 70–99)
Potassium: 4.2 mmol/L (ref 3.5–5.1)
Sodium: 140 mmol/L (ref 135–145)

## 2021-04-21 LAB — LIPID PANEL
Cholesterol: 137 mg/dL (ref 0–200)
HDL: 63 mg/dL (ref >40)
LDL Cholesterol: 56 mg/dL (ref 0–99)
Total CHOL/HDL Ratio: 2.2 RATIO
Triglycerides: 90 mg/dL (ref <150)
VLDL: 18 mg/dL (ref 0–40)

## 2021-04-21 LAB — MAGNESIUM: Magnesium: 1.6 mg/dL — ABNORMAL LOW (ref 1.7–2.4)

## 2021-04-21 MED ORDER — MOMETASONE FURO-FORMOTEROL FUM 100-5 MCG/ACT IN AERO
INHALATION_SPRAY | RESPIRATORY_TRACT | Status: AC
  Filled 2021-04-21: qty 8.8

## 2021-04-21 MED ORDER — HYDRALAZINE HCL 10 MG PO TABS
10.0000 mg | ORAL_TABLET | Freq: Three times a day (TID) | ORAL | Status: DC
  Administered 2021-04-21 – 2021-04-22 (×3): 10 mg via ORAL
  Filled 2021-04-21 (×3): qty 1

## 2021-04-21 MED ORDER — BENAZEPRIL HCL 5 MG PO TABS
10.0000 mg | ORAL_TABLET | Freq: Every day | ORAL | Status: DC
  Administered 2021-04-21 – 2021-04-24 (×4): 10 mg via ORAL
  Filled 2021-04-21: qty 1
  Filled 2021-04-21 (×2): qty 2
  Filled 2021-04-21: qty 1

## 2021-04-21 NOTE — Progress Notes (Signed)
PROGRESS NOTE    [name]  EUM:[id number] DOB: 06-16-1935 DOA: 04/19/2021 PCP: Royann Shivers, PA-C   Brief Narrative:   Mckenzie [name] is a 85 y.o. female with a history of coronary artery disease with history of MI, COPD, HFpEF, GERD, hypothyroidism, hypertension.  Patient seen for leg swelling due to chronic leg ulcerations on her left lower leg.  In the process of evaluation, she was noted to have a heart rate in the 30-40 bpm range and had received atropine in the ED.  She does take metoprolol on a regular basis which has been withheld.  She is on doxycycline for her cellulitis.  Blood pressure remained stable and she is asymptomatic.  Noted to have right-sided facial droop and speech slurring which has prompted CVA evaluation overnight on 11/6.  Assessment & Plan:   Principal Problem:   Bradycardia Active Problems:   GERD (gastroesophageal reflux disease)   Diastolic dysfunction   CAD (coronary artery disease)/Stent to LAD, 2003 [facility]   Hypothyroidism, postsurgical   COPD (chronic obstructive pulmonary disease) (HCC)   Essential hypertension   Lower extremity ulceration (HCC)   Heart block, AV   Sinus bradycardia with 2-1 AV block -Currently asymptomatic and with stable blood pressure readings -Continue to hold metoprolol for washout -Appreciate cardiology evaluation with recommendation for likely transfer for permanent pacemaker placement once CVA evaluation completed  Right-sided facial droop and speech slurring -CT head negative for acute abnormalities -Brain MRI and neurology evaluation pending -LDL 56 and hemoglobin A1c 5.4% -Bilateral carotid ultrasound with moderate atherosclerosis -2D echocardiogram on 11/7 with LVEF 60-65% with moderate mitral valve regurgitation noted   Hypertension -Continue lisinopril and hydralazine   Lower extremity wound with cellulitis -Continue doxycycline -Doppler ultrasound negative for DVT   CAD with  HFpEF -Continue statin and aspirin -Holding beta-blocker for bradycardia   COPD-asymptomatic -Continue home inhalers   Hypothyroidism -Continue levothyroxine -TSH 2.44   GERD -PPI     DVT prophylaxis: Lovenox Code Status: Full Family Communication: None at bedside Disposition Plan:  Status is: Inpatient  Remains inpatient appropriate because: Continues to require ongoing telemetry monitoring and IV medications as well as imaging studies.    Consultants:  Cardiology Neurology   Procedures:  See below  Antimicrobials:  Anti-infectives (From admission, onward)    Start     Dose/Rate Route Frequency Ordered Stop   04/20/21 1000  doxycycline (VIBRA-TABS) tablet 100 mg        100 mg Oral Every 12 hours 04/19/21 2350     04/19/21 1900  doxycycline (VIBRA-TABS) tablet 100 mg        100 mg Oral  Once 04/19/21 1853 04/19/21 1936      Subjective: Patient seen and evaluated today with no new acute complaints or concerns.  She was noted to have some facial drooping and speech slurring overnight which has prompted CVA work-up.  She denies any further symptomatology this morning.  Objective: Vitals:   04/21/21 0400 04/21/21 0741 04/21/21 0842 04/21/21 1203  BP: (!) 191/23     Pulse: (!) 35     Resp: (!) 25     Temp: 98 F (36.7 C) 97.7 F (36.5 C)  97.6 F (36.4 C)  TempSrc: Oral Oral  Oral  SpO2: 94%  99%   Weight:      Height:        Intake/Output Summary (Last 24 hours) at 04/21/2021 1632 Last data filed at 04/21/2021 0600 Gross per 24 hour  Intake 240  ml  Output 100 ml  Net 140 ml   Filed Weights   04/19/21 1516 04/20/21 0025 04/20/21 0500  Weight: 49.9 kg 46.5 kg 46.5 kg    Examination:  General exam: Appears calm and comfortable  Respiratory system: Clear to auscultation. Respiratory effort normal. Cardiovascular system: S1 & S2 heard, RRR.  Currently bradycardic. Gastrointestinal system: Abdomen is soft Central nervous system: Alert and  awake Extremities: No edema Skin: No significant lesions noted Psychiatry: Flat affect.    Data Reviewed: I have personally reviewed following labs and imaging studies  CBC: Recent Labs  Lab 04/19/21 1648 04/20/21 0141 04/21/21 0459  WBC 7.0 8.9 8.5  NEUTROABS 5.4  --   --   HGB 11.5* 11.3* 11.5*  HCT 34.8* 35.7* 37.9  MCV 95.1 94.9 99.2  PLT 177 197 188   Basic Metabolic Panel: Recent Labs  Lab 04/19/21 1648 04/20/21 0141 04/21/21 0459  NA 143 143 140  K 3.4* 3.3* 4.2  CL 110 112* 111  CO2 26 22 22   GLUCOSE 106* 118* 95  BUN 31* 29* 35*  CREATININE 0.77 0.69 0.99  CALCIUM 8.7* 8.7* 8.1*  MG  --   --  1.6*   GFR: Estimated Creatinine Clearance: 31.6 mL/min (by C-G formula based on SCr of 0.99 mg/dL). Liver Function Tests: No results for input(s): AST, ALT, ALKPHOS, BILITOT, PROT, ALBUMIN in the last 168 hours. No results for input(s): LIPASE, AMYLASE in the last 168 hours. No results for input(s): AMMONIA in the last 168 hours. Coagulation Profile: No results for input(s): INR, PROTIME in the last 168 hours. Cardiac Enzymes: No results for input(s): CKTOTAL, CKMB, CKMBINDEX, TROPONINI in the last 168 hours. BNP (last 3 results) No results for input(s): PROBNP in the last 8760 hours. HbA1C: Recent Labs    04/21/21 0459  HGBA1C 5.3   CBG: No results for input(s): GLUCAP in the last 168 hours. Lipid Profile: Recent Labs    04/21/21 0459  CHOL 137  HDL 63  LDLCALC 56  TRIG 90  CHOLHDL 2.2   Thyroid Function Tests: Recent Labs    04/20/21 0716  TSH 2.440   Anemia Panel: No results for input(s): VITAMINB12, FOLATE, FERRITIN, TIBC, IRON, RETICCTPCT in the last 72 hours. Sepsis Labs: No results for input(s): PROCALCITON, LATICACIDVEN in the last 168 hours.  Recent Results (from the past 240 hour(s))  Resp Panel by RT-PCR (Flu A&B, Covid) Nasopharyngeal Swab     Status: None   Collection Time: 04/19/21  6:37 PM   Specimen: Nasopharyngeal Swab;  Nasopharyngeal(NP) swabs in vial transport medium  Result Value Ref Range Status   SARS Coronavirus 2 by RT PCR NEGATIVE NEGATIVE Final    Comment: (NOTE) SARS-CoV-2 target nucleic acids are NOT DETECTED.  The SARS-CoV-2 RNA is generally detectable in upper respiratory specimens during the acute phase of infection. The lowest concentration of SARS-CoV-2 viral copies this assay can detect is 138 copies/mL. A negative result does not preclude SARS-Cov-2 infection and should not be used as the sole basis for treatment or other patient management decisions. A negative result may occur with  improper specimen collection/handling, submission of specimen other than nasopharyngeal swab, presence of viral mutation(s) within the areas targeted by this assay, and inadequate number of viral copies(<138 copies/mL). A negative result must be combined with clinical observations, patient history, and epidemiological information. The expected result is Negative.  Fact Sheet for Patients:  13/05/22  Fact Sheet for Healthcare Providers:  BloggerCourse.com  This test is  no t yet approved or cleared by the [place] and  has been authorized for detection and/or diagnosis of SARS-CoV-2 by FDA under an Emergency Use Authorization (EUA). This EUA will remain  in effect (meaning this test can be used) for the duration of the COVID-19 declaration under Section 564(b)(1) of the Act, 21 U.S.C.section 360bbb-3(b)(1), unless the authorization is terminated  or revoked sooner.       Influenza A by PCR NEGATIVE NEGATIVE Final   Influenza B by PCR NEGATIVE NEGATIVE Final    Comment: (NOTE) The Xpert Xpress SARS-CoV-2/FLU/RSV plus assay is intended as an aid in the diagnosis of influenza from Nasopharyngeal swab specimens and should not be used as a sole basis for treatment. Nasal washings and aspirates are unacceptable for Xpert Xpress  SARS-CoV-2/FLU/RSV testing.  Fact Sheet for Patients: BloggerCourse.com  Fact Sheet for Healthcare Providers: SeriousBroker.it  This test is not yet approved or cleared by the [place] FDA and has been authorized for detection and/or diagnosis of SARS-CoV-2 by FDA under an Emergency Use Authorization (EUA). This EUA will remain in effect (meaning this test can be used) for the duration of the COVID-19 declaration under Section 564(b)(1) of the Act, 21 U.S.C. section 360bbb-3(b)(1), unless the authorization is terminated or revoked.  Performed at [facility], [address], [place], [place] [address]   MRSA Next Gen by PCR, Nasal     Status: None   Collection Time: 04/20/21 12:44 AM   Specimen: Nasal Mucosa; Nasal Swab  Result Value Ref Range Status   MRSA by PCR Next Gen NOT DETECTED NOT DETECTED Final    Comment: (NOTE) The GeneXpert MRSA Assay (FDA approved for NASAL specimens only), is one component of a comprehensive MRSA colonization surveillance program. It is not intended to diagnose MRSA infection nor to guide or monitor treatment for MRSA infections. Test performance is not FDA approved in patients less than 87 years old. Performed at [facility], [address] [place], [place] [address]          Radiology Studies: CT HEAD WO CONTRAST ( )  Result Date: 04/20/2021 CLINICAL DATA:  Weakness and confusion EXAM: CT HEAD WITHOUT CONTRAST TECHNIQUE: Contiguous axial images were obtained from the base of the skull through the vertex without intravenous contrast. COMPARISON:  None. FINDINGS: Brain: No evidence of acute infarction, hemorrhage, hydrocephalus, extra-axial collection or mass lesion/mass effect. Chronic atrophic and ischemic changes are identified. Vascular: No hyperdense vessel or unexpected calcification. Skull: Normal. Negative for fracture or focal lesion. Sinuses/Orbits: No acute finding. Other: None.  IMPRESSION: Chronic atrophic and ischemic changes without acute abnormality. Electronically Signed   By: Alcide Clever M.D.   On: 04/20/2021 20:59   US Carotid Bilateral  Result Date: 04/21/2021 CLINICAL DATA:  85 year old female with altered mental status, right-sided facial droop EXAM: BILATERAL CAROTID DUPLEX ULTRASOUND TECHNIQUE: Wallace Cullens scale imaging, color Doppler and duplex ultrasound were performed of bilateral carotid and vertebral arteries in the neck. COMPARISON:  Report from remote carotid duplex ultrasound dated 07/26/2008; 09/16/2011 FINDINGS: Criteria: Quantification of carotid stenosis is based on velocity parameters that correlate the residual internal carotid diameter with NASCET-based stenosis levels, using the diameter of the distal internal carotid lumen as the denominator for stenosis measurement. The following velocity measurements were obtained: RIGHT ICA: 154/32 cm/sec CCA: 92/12 cm/sec SYSTOLIC ICA/CCA RATIO:  1.7 ECA:  101 cm/sec LEFT ICA: 210/38 cm/sec CCA: 89/13 cm/sec SYSTOLIC ICA/CCA RATIO:  2.4 ECA:  99 cm/sec RIGHT CAROTID ARTERY: Heterogeneous atherosclerotic plaque  in the proximal internal carotid artery. By peak systolic velocity criteria, the estimated stenosis is in the 50-69% diameter range. RIGHT VERTEBRAL ARTERY:  Patent with normal antegrade flow. LEFT CAROTID ARTERY: Heterogeneous atherosclerotic plaque in the proximal internal carotid artery. By peak systolic velocity criteria, the estimated stenosis falls in the 50-69% diameter range. LEFT VERTEBRAL ARTERY:  Patent with normal antegrade flow. IMPRESSION: 1. Moderate (50-69%) stenosis proximal right internal carotid artery secondary to heterogenous atherosclerotic plaque. 2. Moderate (50-69%) stenosis proximal left internal carotid artery secondary to heterogenous atherosclerotic plaque. 3. Vertebral arteries are patent with normal antegrade flow. 4. Degree of internal carotid artery stenosis is not significantly changed  compared to prior imaging from 2010. Electronically Signed   By: Malachy Moan M.D.   On: 04/21/2021 10:46   US Venous Img Lower Bilateral (DVT)  Result Date: 04/21/2021 CLINICAL DATA:  Right calf swelling. Left leg pain. Left hip fracture in April 2022. EXAM: BILATERAL LOWER EXTREMITY VENOUS DOPPLER ULTRASOUND TECHNIQUE: Gray-scale sonography with compression, as well as color and duplex ultrasound, were performed to evaluate the deep venous system(s) from the level of the common femoral vein through the popliteal and proximal calf veins. COMPARISON:  None. FINDINGS: VENOUS Normal compressibility of the common femoral, superficial femoral, and popliteal veins, as well as the visualized calf veins. Visualized portions of profunda femoral vein and great saphenous vein unremarkable. No filling defects to suggest DVT on grayscale or color Doppler imaging. Doppler waveforms show normal direction of venous flow, normal respiratory plasticity and response to augmentation. Limited views of the contralateral common femoral vein are unremarkable. OTHER Right calf subcutaneous edema. Limitations: none IMPRESSION: Negative. Electronically Signed   By: Beckie Salts M.D.   On: 04/21/2021 10:45   DG Chest Port 1 View  Result Date: 04/19/2021 CLINICAL DATA:  Bilateral leg swelling EXAM: PORTABLE CHEST 1 VIEW COMPARISON:  09/27/2020 FINDINGS: Heart size and vascularity within normal limits. Negative for heart failure. Atherosclerotic calcification aortic arch. Spinal cord stimulator unchanged. Bibasilar airspace disease has developed since the prior study. No significant effusion. Underlying COPD IMPRESSION: COPD with bibasilar atelectasis/infiltrate. Electronically Signed   By: Marlan Palau M.D.   On: 04/19/2021 17:54   ECHOCARDIOGRAM COMPLETE  Result Date: 04/21/2021    ECHOCARDIOGRAM REPORT   Patient Name:   Mckenzie [name] Date of Exam: 04/21/2021 Medical Rec #:  [id number]         Height:       62.0 in  Accession #:    [id number]        Weight:       102.5 lb Date of Birth:  05-30-1936         BSA:          1.439 m Patient Age:    85 years          BP:           172/31 mmHg Patient Gender: F                 HR:           35 bpm. [facility] Procedure: 2D Echo, Cardiac Doppler and Color Doppler Indications:    Stroke  History:        Patient has prior history of Echocardiogram examinations, most                 recent 09/27/2020. CAD, COPD, Signs/Symptoms:Shortness of Breath;  Risk Factors:Hypertension and Dyslipidemia. LAD Stent 2003.  Sonographer:    Mikki Harbor Referring Phys: [id number] OLADAPO ADEFESO IMPRESSIONS  1. Left ventricular ejection fraction, by estimation, is 60 to 65%. The left ventricle has normal function. The left ventricle has no regional wall motion abnormalities. Left ventricular diastolic parameters are indeterminate.  2. Right ventricular systolic function is normal. The right ventricular size is normal. Mildly increased right ventricular wall thickness. There is mildly elevated pulmonary artery systolic pressure. The estimated right ventricular systolic pressure is 40.7 mmHg.  3. The mitral valve is degenerative. Moderate mitral valve regurgitation. The mean mitral valve gradient is 6.0 mmHg with average heart rate of 38 bpm. No pulmonary vein assessment. Diastolic mitral regurgitation seen in the setting of heart block.  4. Left atrial size was severely dilated.  5. The pericardial effusion is posterior and lateral to the left ventricle.  6. The aortic valve is tricuspid. There is mild calcification of the aortic valve. There is mild thickening of the aortic valve. Aortic valve regurgitation is not visualized. Mild aortic valve stenosis. Aortic valve mean gradient measures 18.0 mmHg. Aortic valve Vmax measures 2.91 m/s. DVI 0.87 with normal LV stroke volume index.  7. Diastolic tricuspid regurgitation noted in the setting of heart block.  8. The inferior vena  cava is normal in size with greater than 50% respiratory variability, suggesting right atrial pressure of 3 mmHg. Comparison(s): A prior study was performed on 09/27/20. Prior images reviewed side by side. Mitral regurgitation has increased; similar aortic valve parameters, imaged in sinus tachycardia in last study. FINDINGS  Left Ventricle: Left ventricular ejection fraction, by estimation, is 60 to 65%. The left ventricle has normal function. The left ventricle has no regional wall motion abnormalities. The left ventricular internal cavity size was normal in size. There is  no left ventricular hypertrophy. Left ventricular diastolic parameters are indeterminate. Right Ventricle: The right ventricular size is normal. Mildly increased right ventricular wall thickness. Right ventricular systolic function is normal. There is mildly elevated pulmonary artery systolic pressure. The tricuspid regurgitant velocity is 2.86 m/s, and with an assumed right atrial pressure of 8 mmHg, the estimated right ventricular systolic pressure is 40.7 mmHg. Left Atrium: Left atrial size was severely dilated. Right Atrium: Right atrial size was normal in size. Pericardium: Trivial pericardial effusion is present. The pericardial effusion is posterior and lateral to the left ventricle. Mitral Valve: The mitral valve is degenerative in appearance. Moderate mitral valve regurgitation. MV peak gradient, 17.8 mmHg. The mean mitral valve gradient is 6.0 mmHg with average heart rate of 38 bpm. Tricuspid Valve: The tricuspid valve is normal in structure. Tricuspid valve regurgitation is mild . No evidence of tricuspid stenosis. Aortic Valve: The aortic valve is tricuspid. There is mild calcification of the aortic valve. There is mild thickening of the aortic valve. Aortic valve regurgitation is not visualized. Mild aortic stenosis is present. Aortic valve mean gradient measures  18.0 mmHg. Aortic valve peak gradient measures 33.9 mmHg. Aortic  valve area, by VTI measures 1.86 cm. Pulmonic Valve: The pulmonic valve was normal in structure. Pulmonic valve regurgitation is not visualized. No evidence of pulmonic stenosis. Aorta: The aortic root is normal in size and structure. Venous: The inferior vena cava is normal in size with greater than 50% respiratory variability, suggesting right atrial pressure of 3 mmHg. IAS/Shunts: The atrial septum is grossly normal.  LEFT VENTRICLE PLAX 2D LVIDd:         4.40 cm   Diastology LVIDs:  2.90 cm   LV e' medial:   3.94 cm/s LV PW:         0.70 cm   LV E/e' medial: 36.5 LV IVS:        0.90 cm LVOT diam:     1.65 cm LV SV:         136 LV SV Index:   95 LVOT Area:     2.14 cm  RIGHT VENTRICLE RV Basal diam:  3.70 cm RV Mid diam:    2.60 cm RV S prime:     13.40 cm/s TAPSE (M-mode): 2.6 cm LEFT ATRIUM             Index        RIGHT ATRIUM           Index LA diam:        3.70 cm 2.57 cm/m   RA Area:     11.00 cm LA Vol (A2C):   74.2 ml 51.56 ml/m  RA Volume:   24.80 ml  17.23 ml/m LA Vol (A4C):   61.3 ml 42.60 ml/m LA Biplane Vol: 67.8 ml 47.12 ml/m  AORTIC VALVE                     PULMONIC VALVE AV Area (Vmax):    1.71 cm      PV Vmax:       1.46 m/s AV Area (Vmean):   1.73 cm      PV Peak grad:  8.5 mmHg AV Area (VTI):     1.86 cm AV Vmax:           291.00 cm/s AV Vmean:          180.500 cm/s AV VTI:            0.735 m AV Peak Grad:      33.9 mmHg AV Mean Grad:      18.0 mmHg LVOT Vmax:         233.00 cm/s LVOT Vmean:        146.000 cm/s LVOT VTI:          0.638 m LVOT/AV VTI ratio: 0.87  AORTA Ao Asc diam: 2.90 cm MITRAL VALVE                TRICUSPID VALVE MV Area (PHT): 2.37 cm     TR Peak grad:   32.7 mmHg MV Area VTI:   2.58 cm     TR Vmax:        286.00 cm/s MV Peak grad:  17.8 mmHg MV Mean grad:  6.0 mmHg     SHUNTS MV Vmax:       2.11 m/s     Systemic VTI:  0.64 m MV Vmean:      112.0 cm/s   Systemic Diam: 1.65 cm MV Decel Time: 320 msec MV E velocity: 144.00 cm/s MV A velocity: 154.00 cm/s  MV E/A ratio:  0.94 Riley Lam MD Electronically signed by Riley Lam MD Signature Date/Time: 04/21/2021/11:02:38 AM    Final         Scheduled Meds:  aspirin  81 mg Oral BID   atorvastatin  10 mg Oral Daily   benazepril  10 mg Oral Daily   Chlorhexidine Gluconate Cloth  6 each Topical Daily   doxycycline  100 mg Oral Q12H   enoxaparin (LOVENOX) injection  40 mg Subcutaneous Q24H   hydrALAZINE  10 mg Oral Q8H   levothyroxine  25  mcg Oral Q0600   lubiprostone  24 mcg Oral Q breakfast   melatonin  9 mg Oral QHS   mometasone-formoterol  2 puff Inhalation BID   naloxegol oxalate  25 mg Oral Daily   pantoprazole  40 mg Oral Daily   polyethylene glycol  17 g Oral [id number]   raloxifene  60 mg Oral Daily   temazepam  30 mg Oral QHS     LOS: 0 days    Time spent: 35 minutes    Emonnie Cannady Hoover Brunette, DO [facility]  If 7PM-7AM, please contact night-coverage [email or web address] 04/21/2021, 4:32 PM

## 2021-04-21 NOTE — Consult Note (Addendum)
Cardiology Consultation:   Patient ID: Mckenzie [name] MRN: [id number]; DOB: [birth year]  Admit date: 04/19/2021 Date of Consult: 04/21/2021  PCP:  Rosalee Kaufman, PA-C   [facility] Cardiologist:  Werner Lean, MD        Patient Profile:   Mckenzie [name] is a 85 y.o. female with a hx of CAD who is being seen 04/21/2021 for the evaluation of bradycardia at the request of Dr. Manuella Ghazi.  History of Present Illness:   Mckenzie [name] with history of CAD S/P stent LAD at [facility], patent on cath 2012, HTN, mild AS on echo [id number], chronic diastolic CHF, HTN, HLD, PSVT, carotid disease.  Patient comes in with trouble breathing at night, some palpitations on metoprolol 25 mg bid. In ED HR 30's with 2:1 AV block was given atropine and HR up to 47. BP 180-200's, metoprolol held.  Last night patient had right sided facial droop and slurred speech which resolved, CT chronic atrophic ischemic changes no acute abnormality. For carotids, MRI and echo today.  Patient has been treated recently at wound center for ulcer on her leg. She has been short of breath for about a month. She remains active caring for her son who is blind. She denies chest pain, dizziness or presyncope, edema. She has occasional palpitations lasting 3 min or less.   Past Medical History:  Diagnosis Date   Aortic valve sclerosis    Mild, echo, April, 2012   ARDS (adult respiratory distress syndrome) ([place]) 2003   with tracheostomy-peritonitis   Arthritis    Back pain    Need for surgery, Dr. Glenna Fellows   CAD (coronary artery disease)    Stent to LAD, 2003 [facility] / nuclear scan April, 201 to artifact / persistent shortness of breath / catheterization Oct 21, 2010... widely patent stent to the mid LAD, no significant obstructive disease, should be low risk    for back surgery   Carotid arterial disease ([place])    Moderate-Dr. Trula Slade following   Chronic back pain    COPD (chronic obstructive pulmonary  disease) ([place])    Diastolic dysfunction    Echo, April, 2011   EF 65%   Dysrhythmia    afib   Ejection fraction    EF 65%, echo, September 15, 2010   GERD (gastroesophageal reflux disease)    Hemolytic anemia ([place])    Followed by Dr. Janae Sauce   Hypertension    Hypothyroidism    Left ventricular hypertrophy    EF 65%, echo, April, 2011   MR (mitral regurgitation)     Mild echo, 2012 ///  Mild per Echo 2009,  mild, echo, April, 2011   Myocardial infarction [facility])    Palpitations    Holter, September 16, 2010, PACs and PVCs,one 4 beat run of SVT   Pneumonia 11-2011   Preoperative clearance    Preop clearance for back surgery   Shortness of breath    March, 2012   UTI (urinary tract infection)    Chronic-on Macrodantin    Past Surgical History:  Procedure Laterality Date   APPENDECTOMY     BACK SURGERY     BREAST LUMPECTOMY Left    CATARACT EXTRACTION W/PHACO Right 12/05/2012   Procedure: CATARACT EXTRACTION PHACO AND INTRAOCULAR LENS PLACEMENT ([place]);  Surgeon: Tonny Branch, MD;  Location: [facility];  Service: Ophthalmology;  Laterality: Right;  CDE:15.30   CATARACT EXTRACTION W/PHACO Left 12/15/2012   Procedure: CATARACT EXTRACTION PHACO AND INTRAOCULAR LENS PLACEMENT ([facility]);  Surgeon: Tonny Branch, MD;  Location: [facility];  Service: Ophthalmology;  Laterality: Left;  CDE:22.56   CORONARY ANGIOPLASTY WITH STENT PLACEMENT  2003   INTRAMEDULLARY (IM) NAIL INTERTROCHANTERIC Left 09/27/2020   Procedure: INTRAMEDULLARY (IM) NAIL INTERTROCHANTRIC;  Surgeon: Meredith Pel, MD;  Location: [facility];  Service: Orthopedics;  Laterality: Left;  left trochanter   repair of ruptured lumbar discs  12-2011   SPINE SURGERY     lumbar disk removal by Dr. Francesco Runner at [place]  08/2009   Benign adenomas   TONSILLECTOMY     TRACHEOSTOMY  2003   Following ARDS      Home Medications:  Prior to Admission medications   Medication Sig Start Date End Date Taking? Authorizing Provider   acetaminophen (TYLENOL) 325 MG tablet Take 1-2 tablets (325-650 mg total) by mouth every 4 (four) hours as needed for mild pain. 10/11/20  Yes Love, Ivan Anchors, PA-C  atorvastatin (LIPITOR) 10 MG tablet Take 10 mg by mouth daily.  07/22/12  Yes [provider]  benazepril (LOTENSIN) 5 MG tablet Take 5 mg by mouth daily.   Yes [provider]  Calcium Carbonate (CALCIUM 600 PO) Take 1,200 mg by mouth daily.   Yes [provider]  Cholecalciferol (VITAMIN D3) 1000 UNITS CAPS Take 1,000 Units by mouth daily. Take one tab daily   Yes [provider]  conjugated estrogens (PREMARIN) vaginal cream Place 1 Applicatorful vaginally daily.   Yes [provider]  diphenhydrAMINE (BENADRYL) 25 mg capsule Take 25 mg by mouth daily as needed for sleep.   Yes [provider]  esomeprazole (NEXIUM) 40 MG capsule Take 40 mg by mouth daily before breakfast.   Yes [provider]  estrogens, conjugated, (PREMARIN) 0.625 MG tablet Take 0.625 mg by mouth daily.   Yes [provider]  HYDROcodone-acetaminophen (NORCO) 10-325 MG tablet Take 1 tablet by mouth every 4 (four) hours as needed for severe pain. 10/11/20  Yes Love, Ivan Anchors, PA-C  hyoscyamine (LEVSIN SL) 0.125 MG SL tablet Take 0.125 mg by mouth every 6 (six) hours as needed for cramping or diarrhea or loose stools.   Yes [provider]  levothyroxine (SYNTHROID) 25 MCG tablet TAKE 1 TABLET BY MOUTH DAILY AT 6 AM. Patient taking differently: Take 25 mcg by mouth daily before breakfast. 03/24/21  Yes Raulkar, Clide Deutscher, MD  lubiprostone (AMITIZA) 24 MCG capsule Take by mouth. 03/03/21  Yes [provider]  Melatonin 10 MG TABS Take 1 tablet by mouth at bedtime.   Yes [provider]  methocarbamol (ROBAXIN) 500 MG tablet Take 1 tablet (500 mg total) by mouth every 6 (six) hours as needed for muscle spasms. 10/11/20  Yes Love, Ivan Anchors, PA-C  metoprolol tartrate  (LOPRESSOR) 25 MG tablet TAKE (1) TABLET TWICE DAILY. 10/12/20  Yes Raulkar, Clide Deutscher, MD  mometasone-formoterol (DULERA) 100-5 MCG/ACT AERO Inhale 2 puffs into the lungs 2 (two) times daily. 10/11/20  Yes Love, Ivan Anchors, PA-C  MOVANTIK 25 MG TABS tablet Take 25 mg by mouth daily. 09/16/20  Yes [provider]  Multiple Vitamin (MULTIVITAMIN) tablet Take 1 tablet by mouth daily.   Yes [provider]  nitroGLYCERIN (NITROSTAT) 0.4 MG SL tablet Place under the tongue.   Yes [provider]  polyethylene glycol (MIRALAX / GLYCOLAX) 17 g packet Take 17 g by mouth every morning.   Yes [provider]  raloxifene (EVISTA) 60 MG tablet Take 60 mg  by mouth daily. 04/13/19  Yes [provider]  tamsulosin (FLOMAX) 0.4 MG CAPS capsule Take 0.4 mg by mouth daily. 04/06/19  Yes [provider]  temazepam (RESTORIL) 30 MG capsule Take 30 mg by mouth at bedtime.   Yes [provider]  traMADol (ULTRAM) 50 MG tablet 1  po q hs prn pain 01/20/21  Yes Cammy Copa, MD  traZODone (DESYREL) 50 MG tablet Take 50 mg by mouth at bedtime as needed for sleep. 06/28/19  Yes [provider]  tretinoin (RETIN-A) 0.05 % cream Apply 1 application topically at bedtime.   Yes [provider]  vitamin C (ASCORBIC ACID) 500 MG tablet Take 500 mg by mouth daily.   Yes [provider]  aspirin 81 MG chewable tablet Chew 1 tablet (81 mg total) by mouth 2 (two) times daily. Patient not taking: No sig reported 10/11/20   Love, Evlyn Kanner, PA-C  iron polysaccharides (NIFEREX) 150 MG capsule Take 1 capsule (150 mg total) by mouth daily. Patient not taking: No sig reported 10/11/20   Love, Evlyn Kanner, PA-C  Melatonin 10 MG CAPS Take by mouth. Patient not taking: No sig reported    [provider]  sulfamethoxazole-trimethoprim (BACTRIM DS) 800-160 MG tablet Take 1 tablet by mouth 2 (two) times daily. Patient not taking: No sig reported  03/27/21   [provider]    Inpatient Medications: Scheduled Meds:  aspirin  81 mg Oral BID   atorvastatin  10 mg Oral Daily   benazepril  10 mg Oral Daily   Chlorhexidine Gluconate Cloth  6 each Topical Daily   doxycycline  100 mg Oral Q12H   enoxaparin (LOVENOX) injection  40 mg Subcutaneous Q24H   hydrALAZINE  10 mg Oral Q8H   levothyroxine  25 mcg Oral Q0600   lubiprostone  24 mcg Oral Q breakfast   melatonin  9 mg Oral QHS   mometasone-formoterol  2 puff Inhalation BID   naloxegol oxalate  25 mg Oral Daily   pantoprazole  40 mg Oral Daily   polyethylene glycol  17 g Oral BH-q7a   raloxifene  60 mg Oral Daily   temazepam  30 mg Oral QHS   Continuous Infusions:  PRN Meds: docusate sodium, hydrALAZINE, HYDROcodone-acetaminophen, hyoscyamine, methocarbamol, ondansetron (ZOFRAN) IV, polyethylene glycol, traMADol, traZODone  Allergies:    Allergies  Allergen Reactions   Tetracycline Nausea Only    Social History:   Social History   Socioeconomic History   Marital status: Married    Spouse name: Not on file   Number of children: 1   Years of education: Not on file   Highest education level: Not on file  Occupational History   Occupation: Retired  Tobacco Use   Smoking status: Former    Packs/day: 1.00    Years: 20.00    Pack years: 20.00    Types: Cigarettes    Quit date: 06/15/2000    Years since quitting: 20.8   Smokeless tobacco: Never  Vaping Use   Vaping Use: Never used  Substance and Sexual Activity   Alcohol use: No   Drug use: No   Sexual activity: Not on file  Other Topics Concern   Not on file  Social History Narrative   Regular Exercise: Yes-Walk   Social Determinants of Health   Financial Resource Strain: Not on file  Food Insecurity: Not on file  Transportation Needs: Not on file  Physical Activity: Not on file  Stress: Not on file  Social Connections: Not on file  Intimate Partner Violence: Not on file    Family History:      Family History  Problem Relation Age of Onset   Heart failure Father    Diabetes Son      ROS:  Please see the history of present illness.  Review of Systems  Constitutional: Negative.  HENT: Negative.    Eyes: Negative.   Cardiovascular: Negative.   Respiratory:  Positive for shortness of breath.   Hematologic/Lymphatic: Negative.   Musculoskeletal: Negative.  Negative for joint pain.  Gastrointestinal: Negative.   Genitourinary: Negative.   Neurological: Negative.    All other ROS reviewed and negative.     Physical Exam/Data:   Vitals:   04/21/21 0200 04/21/21 0300 04/21/21 0400 04/21/21 0741  BP: (!) 184/35 (!) 206/63 (!) 191/23   Pulse:   (!) 35   Resp: 20 (!) 22 (!) 25   Temp:   98 F (36.7 C) 97.7 F (36.5 C)  TempSrc:   Oral Oral  SpO2:   94%   Weight:      Height:        Intake/Output Summary (Last 24 hours) at 04/21/2021 0834 Last data filed at 04/21/2021 0600 Gross per 24 hour  Intake 600 ml  Output 250 ml  Net 350 ml   Last 3 Weights 04/20/2021 04/20/2021 04/19/2021  Weight (lbs) 102 lb 8.2 oz 102 lb 8.2 oz 110 lb  Weight (kg) 46.5 kg 46.5 kg 49.896 kg     Body mass index is 18.75 kg/m.  General: Thin, in no acute distress  HEENT: normal Neck: no JVD Vascular: No carotid bruits; Distal pulses 2+ bilaterally Cardiac:  normal S1, S2; RRR; 4/6 systolic murmur LSB and apex Lungs:  clear to auscultation bilaterally, no wheezing, rhonchi or rales  Abd: soft, nontender, no hepatomegaly  Ext: no edema small ulcer LLE Musculoskeletal:  No deformities, BUE and BLE strength normal and equal Skin: warm and dry  Neuro:  CNs 2-12 intact, no focal abnormalities noted Psych:  Normal affect   EKG:  The EKG was personally reviewed and demonstrates:  sinus bradycardia 39/m 2:1 AV block RBBB Telemetry:  Telemetry was personally reviewed and demonstrates:  sinus bradycardia 40's 2:1 AV block  Relevant CV Studies:  Carotid artery Dopplers on 04/05/17 showed  less than 40% stenosis bilaterally.   Echocardiogram 04/20/18:   - Left ventricle: The cavity size was normal. Wall thickness was   normal. Systolic function was normal. The estimated ejection   fraction was in the range of 60% to 65%. Wall motion was normal;   there were no regional wall motion abnormalities. - Aortic valve: Mildly calcified annulus. Trileaflet; mildly   thickened leaflets. There was mild stenosis. Mean gradient (S):   11 mm Hg. Valve area (VTI): 1.78 cm^2. Valve area (Vmax): 1.63   cm^2. Valve area (Vmean): 1.77 cm^2. - Mitral valve: Mildly calcified annulus. Mildly thickened leaflets   . There was mild regurgitation. - Pulmonary arteries: Systolic pressure was mildly increased. PA   peak pressure: 38 mm Hg (S).    Laboratory Data:  High Sensitivity Troponin:  No results for input(s): TROPONINIHS in the last 720 hours.   Chemistry Recent Labs  Lab 04/19/21 1648 04/20/21 0141 04/21/21 0459  NA 143 143 140  K 3.4* 3.3* 4.2  CL 110 112* 111  CO2 26 22 22   GLUCOSE 106* 118* 95  BUN 31* 29* 35*  CREATININE 0.77 0.69 0.99  CALCIUM 8.7*  8.7* 8.1*  MG  --   --  1.6*  GFRNONAA >60 >60 57*  ANIONGAP 7 9 7     No results for input(s): PROT, ALBUMIN, AST, ALT, ALKPHOS, BILITOT in the last 168 hours. Lipids  Recent Labs  Lab 04/21/21 0459  CHOL 137  TRIG 90  HDL 63  LDLCALC 56  CHOLHDL 2.2    Hematology Recent Labs  Lab 04/19/21 1648 04/20/21 0141 04/21/21 0459  WBC 7.0 8.9 8.5  RBC 3.66* 3.76* 3.82*  HGB 11.5* 11.3* 11.5*  HCT 34.8* 35.7* 37.9  MCV 95.1 94.9 99.2  MCH 31.4 30.1 30.1  MCHC 33.0 31.7 30.3  RDW 13.2 13.3 13.7  PLT 177 197 188   Thyroid  Recent Labs  Lab 04/20/21 0716  TSH 2.440    BNP Recent Labs  Lab 04/19/21 1648  BNP 863.0*    DDimer No results for input(s): DDIMER in the last 168 hours.   Radiology/Studies:  CT HEAD WO CONTRAST (5MM)  Result Date: 04/20/2021 CLINICAL DATA:  Weakness and confusion EXAM: CT HEAD  WITHOUT CONTRAST TECHNIQUE: Contiguous axial images were obtained from the base of the skull through the vertex without intravenous contrast. COMPARISON:  None. FINDINGS: Brain: No evidence of acute infarction, hemorrhage, hydrocephalus, extra-axial collection or mass lesion/mass effect. Chronic atrophic and ischemic changes are identified. Vascular: No hyperdense vessel or unexpected calcification. Skull: Normal. Negative for fracture or focal lesion. Sinuses/Orbits: No acute finding. Other: None. IMPRESSION: Chronic atrophic and ischemic changes without acute abnormality. Electronically Signed   By: Inez Catalina M.D.   On: 04/20/2021 20:59   DG Chest Port 1 View  Result Date: 04/19/2021 CLINICAL DATA:  Bilateral leg swelling EXAM: PORTABLE CHEST 1 VIEW COMPARISON:  09/27/2020 FINDINGS: Heart size and vascularity within normal limits. Negative for heart failure. Atherosclerotic calcification aortic arch. Spinal cord stimulator unchanged. Bibasilar airspace disease has developed since the prior study. No significant effusion. Underlying COPD IMPRESSION: COPD with bibasilar atelectasis/infiltrate. Electronically Signed   By: Franchot Gallo M.D.   On: 04/19/2021 17:54     Assessment and Plan:   Bradycardia with 2:1 AV block on metoprolol has been held since 04/19/21, still brady in 30-40's. Only symptom is SOB.Await echo, MRI and carotids today but will probably need pacemaker  TIA yest with facial droop and slurrred speech resolved CT neg MRI today  HTN up since admission and off metoprolol. Increase benazepril and schedule hydralazine 10 mg TID  CAD stent LAD 2003, last cath 2012 patent stent-no chest pain  Chronic diastolic CHF compensated Carotid disease-for dopplers today  Mild AS 2019-most likely progressed. Await echo      For questions or updates, please contact [place] Please consult [email or web address] for contact info under    Signed, Ermalinda Barrios, PA-C  04/21/2021 8:34 AM     Personally seen and examined. Agree with APP above with the following comments: Briefly3 yo F with a history of CAD, HFpEF, AS NOS, carotid disease  who presents with new syncope.  Now has some residual SOB.  "[place] out at home." Patient notes that's she been short of breath four about one month.  She was functional and doing find.  She tells me that she was sitting on a chair next to her husband and fell out.    Last night passed out again and found to have new facial droop and slurred speech.  Exam notable for Bilateral Carotid Bruit, frail thin and cachetic female, III/VI systolic crescendo murmur, no LE edema  small pulses, +2 radial pulses; unclear I/Os Labs notable for  BNP 863 04/19/21, TSH WNL Personally reviewed relevant tests; Tele with 2:1 HB despite BB washout. Would recommend  - increase Lotensin, scheduled hydralazine 10 mg PO TID, BP goal may be higher given TIA work up from 04/20/21 - continue atorvastatin 10 mg PO daily (LDL 56), ASA 81 mg PO daily - her metoprolol tartrate 25 mg PO daily has been held since admission with persistent 2:1 HB; if her carotid disease and Stroke work up is negative, she is a function would who may be in consideration for transfer for PPM - getting echo today, suspect worsened AS  Rudean Haskell, MD [place]  [address] [place], [place] [address] [phone]  9:35 AM

## 2021-04-21 NOTE — Progress Notes (Signed)
*  PRELIMINARY RESULTS* Echocardiogram 2D Echocardiogram has been performed.  Mckenzie [name] 04/21/2021, 9:35 AM

## 2021-04-21 NOTE — Progress Notes (Signed)
Pt pulled her telemetry leads off. As I was walking to the room to fix the leads I heard the pt fall to the floor. When I entered the room she was sitting in the floor beside of the bed naked. She said she had no pain and when I asked her where she was going she said she just wanted to get up. She was able to correctly tell me who she was, where she was and what year it was. She did get a small skin tear to left posterior elbow. I asked her if she wanted me to contact her husband and let him know what happened and she said yes so I gave her husband a call and updated him about what had happened. Let Dr. Thomes Dinning know and he said to continue to monitor.

## 2021-04-22 ENCOUNTER — Inpatient Hospital Stay (HOSPITAL_COMMUNITY): Payer: Medicare Other

## 2021-04-22 DIAGNOSIS — I441 Atrioventricular block, second degree: Principal | ICD-10-CM

## 2021-04-22 DIAGNOSIS — R001 Bradycardia, unspecified: Secondary | ICD-10-CM

## 2021-04-22 LAB — BASIC METABOLIC PANEL WITH GFR
Anion gap: 4 — ABNORMAL LOW (ref 5–15)
BUN: 34 mg/dL — ABNORMAL HIGH (ref 8–23)
CO2: 25 mmol/L (ref 22–32)
Calcium: 8 mg/dL — ABNORMAL LOW (ref 8.9–10.3)
Chloride: 112 mmol/L — ABNORMAL HIGH (ref 98–111)
Creatinine, Ser: 0.67 mg/dL (ref 0.44–1.00)
GFR, Estimated: >60 mL/min
Glucose, Bld: 88 mg/dL (ref 70–99)
Potassium: 4.2 mmol/L (ref 3.5–5.1)
Sodium: 141 mmol/L (ref 135–145)

## 2021-04-22 LAB — CBC
HCT: 42.3 % (ref 36.0–46.0)
Hemoglobin: 12.9 g/dL (ref 12.0–15.0)
MCH: 30.5 pg (ref 26.0–34.0)
MCHC: 30.5 g/dL (ref 30.0–36.0)
MCV: 100 fL (ref 80.0–100.0)
Platelets: 180 10*3/uL (ref 150–400)
RBC: 4.23 MIL/uL (ref 3.87–5.11)
RDW: 13.5 % (ref 11.5–15.5)
WBC: 7.1 10*3/uL (ref 4.0–10.5)
nRBC: 0 % (ref 0.0–0.2)

## 2021-04-22 LAB — SURGICAL PCR SCREEN
MRSA, PCR: NEGATIVE
Staphylococcus aureus: NEGATIVE

## 2021-04-22 LAB — MAGNESIUM: Magnesium: 1.7 mg/dL (ref 1.7–2.4)

## 2021-04-22 MED ORDER — IOHEXOL 350 MG/ML SOLN
100.0000 mL | Freq: Once | INTRAVENOUS | Status: AC | PRN
  Administered 2021-04-22: 75 mL via INTRAVENOUS

## 2021-04-22 MED ORDER — CEFAZOLIN SODIUM-DEXTROSE 2-4 GM/100ML-% IV SOLN
2.0000 g | INTRAVENOUS | Status: AC
  Administered 2021-04-23: 2 g via INTRAVENOUS
  Filled 2021-04-22: qty 100

## 2021-04-22 MED ORDER — CHLORHEXIDINE GLUCONATE 4 % EX LIQD
60.0000 mL | Freq: Once | CUTANEOUS | Status: AC
  Administered 2021-04-22: 4 via TOPICAL
  Filled 2021-04-22: qty 15

## 2021-04-22 MED ORDER — MAGNESIUM SULFATE IN D5W 1-5 GM/100ML-% IV SOLN
1.0000 g | Freq: Once | INTRAVENOUS | Status: AC
  Administered 2021-04-22: 1 g via INTRAVENOUS
  Filled 2021-04-22: qty 100

## 2021-04-22 MED ORDER — SODIUM CHLORIDE 0.9 % IV SOLN
80.0000 mg | INTRAVENOUS | Status: AC
  Administered 2021-04-23: 80 mg
  Filled 2021-04-22: qty 2

## 2021-04-22 MED ORDER — SODIUM CHLORIDE 0.9 % IV SOLN
250.0000 mL | INTRAVENOUS | Status: DC
  Administered 2021-04-23: 250 mL via INTRAVENOUS

## 2021-04-22 MED ORDER — SODIUM CHLORIDE 0.9% FLUSH: 3.0000 mL | INTRAVENOUS | Status: DC | PRN

## 2021-04-22 MED ORDER — SODIUM CHLORIDE 0.9 % IV SOLN: INTRAVENOUS | Status: DC

## 2021-04-22 MED ORDER — SODIUM CHLORIDE 0.9% FLUSH
3.0000 mL | Freq: Two times a day (BID) | INTRAVENOUS | Status: DC
  Administered 2021-04-22 – 2021-04-24 (×2): 3 mL via INTRAVENOUS

## 2021-04-22 MED ORDER — CHLORHEXIDINE GLUCONATE 4 % EX LIQD
60.0000 mL | Freq: Once | CUTANEOUS | Status: AC
  Administered 2021-04-23: 4 via TOPICAL

## 2021-04-22 MED ORDER — HYDRALAZINE HCL 25 MG PO TABS
25.0000 mg | ORAL_TABLET | Freq: Three times a day (TID) | ORAL | Status: DC
  Administered 2021-04-22 – 2021-04-24 (×6): 25 mg via ORAL
  Filled 2021-04-22 (×6): qty 1

## 2021-04-22 NOTE — Progress Notes (Signed)
PROGRESS NOTE    Mckenzie [name]  [id number] DOB: 11/19/1935 DOA: 04/19/2021 PCP: Rosalee Kaufman, PA-C   Brief Narrative:   Mckenzie [name] is a 85 y.o. female with a history of coronary artery disease with history of MI, COPD, HFpEF, GERD, hypothyroidism, hypertension.  Patient seen for leg swelling due to chronic leg ulcerations on her left lower leg.  In the process of evaluation, she was noted to have a heart rate in the 30-40 bpm range and had received atropine in the ED.  She does take metoprolol on a regular basis which has been withheld.  She is on doxycycline for her cellulitis.  Blood pressure remained stable and she is asymptomatic.  Noted to have right-sided facial droop and speech slurring which has prompted CVA evaluation overnight on 11/6.  Her brain MRI has returned negative and she currently remains on aspirin and statin.  Neurology recommending CTA head which is pending.  Okay for transfer to telemetry to [facility] for permanent pacemaker placement.  Cardiology will notify EP.  Assessment & Plan:   Principal Problem:   Bradycardia Active Problems:   GERD (gastroesophageal reflux disease)   Diastolic dysfunction   CAD (coronary artery disease)/Stent to LAD, 2003 [facility]   Hypothyroidism, postsurgical   COPD (chronic obstructive pulmonary disease) (HCC)   Essential hypertension   Lower extremity ulceration (HCC)   Heart block, AV   Sinus bradycardia with 2-1 AV block -Currently asymptomatic and with stable blood pressure readings -Appreciate cardiology evaluation with recommendation to transfer to [facility] for permanent pacemaker placement.  EP will be notified. -Okay for cardiac telemetry   Right-sided facial droop and speech slurring -CT head negative for acute abnormalities -Brain MRI negative -LDL 56 and hemoglobin A1c 5.3% -Bilateral carotid ultrasound with moderate atherosclerosis and will require outpatient referral to vascular surgery for  further evaluation -2D echocardiogram on 11/7 with LVEF 60-65% with moderate mitral valve regurgitation noted -CTA head ordered 11/8 and currently pending.  Okay for transfer otherwise and Dr. Hortense Ramal aware.  CTA head will be reviewed to see if patient will require dual antiplatelet therapy.   Hypertension-elevated -Continue lisinopril -Hydralazine dose increased to 25 mg 3 times daily per cardiology   Lower extremity wound with cellulitis -Continue doxycycline -Doppler ultrasound negative for DVT   CAD with HFpEF -Continue statin and aspirin -Holding beta-blocker for bradycardia   COPD-asymptomatic -Continue home inhalers   Hypothyroidism -Continue levothyroxine -TSH 2.44   GERD -PPI     DVT prophylaxis: Lovenox Code Status: Full Family Communication: Discussed with husband on phone 11/8 Disposition Plan:  Status is: Inpatient   Remains inpatient appropriate because: Continues to require ongoing telemetry monitoring and IV medications as well as imaging studies.     Consultants:  Cardiology Neurology   Procedures:  See below  Antimicrobials:  Anti-infectives (From admission, onward)    Start     Dose/Rate Route Frequency Ordered Stop   04/20/21 1000  doxycycline (VIBRA-TABS) tablet 100 mg        100 mg Oral Every 12 hours 04/19/21 2350     04/19/21 1900  doxycycline (VIBRA-TABS) tablet 100 mg        100 mg Oral  Once 04/19/21 1853 04/19/21 1936       Subjective: Patient seen and evaluated today with no new acute complaints or concerns. No acute concerns or events noted overnight.  She continues to have low heart rates, but some elevated blood pressure readings.  She denies any further  dysarthria and is able to eat and drink okay with no significant issues.  Objective: Vitals:   04/22/21 0600 04/22/21 0609 04/22/21 0610 04/22/21 0730  BP: (!) 219/33 (!) 227/34 (!) 227/34   Pulse:      Resp: (!) 23  20   Temp:    (!) 97.5 F (36.4 C)  TempSrc:    Oral   SpO2:      Weight:      Height:        Intake/Output Summary (Last 24 hours) at 04/22/2021 1117 Last data filed at 04/22/2021 0830 Gross per 24 hour  Intake --  Output 500 ml  Net -500 ml   Filed Weights   04/19/21 1516 04/20/21 0025 04/20/21 0500  Weight: 49.9 kg 46.5 kg 46.5 kg    Examination:  General exam: Appears calm and comfortable  Respiratory system: Clear to auscultation. Respiratory effort normal. Cardiovascular system: S1 & S2 heard, RRR.  Bradycardic. Gastrointestinal system: Abdomen is soft Central nervous system: Alert and awake Extremities: No edema Skin: No significant lesions noted Psychiatry: Flat affect.    Data Reviewed: I have personally reviewed following labs and imaging studies  CBC: Recent Labs  Lab 04/19/21 1648 04/20/21 0141 04/21/21 0459 04/22/21 0403  WBC 7.0 8.9 8.5 7.1  NEUTROABS 5.4  --   --   --   HGB 11.5* 11.3* 11.5* 12.9  HCT 34.8* 35.7* 37.9 42.3  MCV 95.1 94.9 99.2 100.0  PLT 177 197 188 [id number]   Basic Metabolic Panel: Recent Labs  Lab 04/19/21 1648 04/20/21 0141 04/21/21 0459 04/22/21 0403  NA 143 143 140 141  K 3.4* 3.3* 4.2 4.2  CL 110 112* 111 112*  CO2 26 22 22 25   GLUCOSE 106* 118* 95 88  BUN 31* 29* 35* 34*  CREATININE 0.77 0.69 0.99 0.67  CALCIUM 8.7* 8.7* 8.1* 8.0*  MG  --   --  1.6* 1.7   GFR: Estimated Creatinine Clearance: 39.1 mL/min (by C-G formula based on SCr of 0.67 mg/dL). Liver Function Tests: No results for input(s): AST, ALT, ALKPHOS, BILITOT, PROT, ALBUMIN in the last 168 hours. No results for input(s): LIPASE, AMYLASE in the last 168 hours. No results for input(s): AMMONIA in the last 168 hours. Coagulation Profile: No results for input(s): INR, PROTIME in the last 168 hours. Cardiac Enzymes: No results for input(s): CKTOTAL, CKMB, CKMBINDEX, TROPONINI in the last 168 hours. BNP (last 3 results) No results for input(s): PROBNP in the last 8760 hours. HbA1C: Recent Labs     04/21/21 0459  HGBA1C 5.3   CBG: No results for input(s): GLUCAP in the last 168 hours. Lipid Profile: Recent Labs    04/21/21 0459  CHOL 137  HDL 63  LDLCALC 56  TRIG 90  CHOLHDL 2.2   Thyroid Function Tests: Recent Labs    04/20/21 0716  TSH 2.440   Anemia Panel: No results for input(s): VITAMINB12, FOLATE, FERRITIN, TIBC, IRON, RETICCTPCT in the last 72 hours. Sepsis Labs: No results for input(s): PROCALCITON, LATICACIDVEN in the last 168 hours.  Recent Results (from the past 240 hour(s))  Resp Panel by RT-PCR (Flu A&B, Covid) Nasopharyngeal Swab     Status: None   Collection Time: 04/19/21  6:37 PM   Specimen: Nasopharyngeal Swab; Nasopharyngeal(NP) swabs in vial transport medium  Result Value Ref Range Status   SARS Coronavirus 2 by RT PCR NEGATIVE NEGATIVE Final    Comment: (NOTE) SARS-CoV-2 target nucleic acids are NOT DETECTED.  The SARS-CoV-2  RNA is generally detectable in upper respiratory specimens during the acute phase of infection. The lowest concentration of SARS-CoV-2 viral copies this assay can detect is 138 copies/mL. A negative result does not preclude SARS-Cov-2 infection and should not be used as the sole basis for treatment or other patient management decisions. A negative result may occur with  improper specimen collection/handling, submission of specimen other than nasopharyngeal swab, presence of viral mutation(s) within the areas targeted by this assay, and inadequate number of viral copies(<138 copies/mL). A negative result must be combined with clinical observations, patient history, and epidemiological information. The expected result is Negative.  Fact Sheet for Patients:  EntrepreneurPulse.com.au  Fact Sheet for Healthcare Providers:  IncredibleEmployment.be  This test is no t yet approved or cleared by the [place] FDA and  has been authorized for detection and/or diagnosis of SARS-CoV-2  by FDA under an Emergency Use Authorization (EUA). This EUA will remain  in effect (meaning this test can be used) for the duration of the COVID-19 declaration under Section 564(b)(1) of the Act, 21 U.S.C.section 360bbb-3(b)(1), unless the authorization is terminated  or revoked sooner.       Influenza A by PCR NEGATIVE NEGATIVE Final   Influenza B by PCR NEGATIVE NEGATIVE Final    Comment: (NOTE) The Xpert Xpress SARS-CoV-2/FLU/RSV plus assay is intended as an aid in the diagnosis of influenza from Nasopharyngeal swab specimens and should not be used as a sole basis for treatment. Nasal washings and aspirates are unacceptable for Xpert Xpress SARS-CoV-2/FLU/RSV testing.  Fact Sheet for Patients: EntrepreneurPulse.com.au  Fact Sheet for Healthcare Providers: IncredibleEmployment.be  This test is not yet approved or cleared by the [place] FDA and has been authorized for detection and/or diagnosis of SARS-CoV-2 by FDA under an Emergency Use Authorization (EUA). This EUA will remain in effect (meaning this test can be used) for the duration of the COVID-19 declaration under Section 564(b)(1) of the Act, 21 U.S.C. section 360bbb-3(b)(1), unless the authorization is terminated or revoked.  Performed at [facility], [address] [place], [place] [address]   MRSA Next Gen by PCR, Nasal     Status: None   Collection Time: 04/20/21 12:44 AM   Specimen: Nasal Mucosa; Nasal Swab  Result Value Ref Range Status   MRSA by PCR Next Gen NOT DETECTED NOT DETECTED Final    Comment: (NOTE) The GeneXpert MRSA Assay (FDA approved for NASAL specimens only), is one component of a comprehensive MRSA colonization surveillance program. It is not intended to diagnose MRSA infection nor to guide or monitor treatment for MRSA infections. Test performance is not FDA approved in patients less than 62 years old. Performed at [facility], [address]  [place],  [address]          Radiology Studies: CT HEAD WO CONTRAST (5MM)  Result Date: 04/20/2021 CLINICAL DATA:  Weakness and confusion EXAM: CT HEAD WITHOUT CONTRAST TECHNIQUE: Contiguous axial images were obtained from the base of the skull through the vertex without intravenous contrast. COMPARISON:  None. FINDINGS: Brain: No evidence of acute infarction, hemorrhage, hydrocephalus, extra-axial collection or mass lesion/mass effect. Chronic atrophic and ischemic changes are identified. Vascular: No hyperdense vessel or unexpected calcification. Skull: Normal. Negative for fracture or focal lesion. Sinuses/Orbits: No acute finding. Other: None. IMPRESSION: Chronic atrophic and ischemic changes without acute abnormality. Electronically Signed   By: Inez Catalina M.D.   On: 04/20/2021 20:59   MR BRAIN WO CONTRAST  Result Date: 04/22/2021 CLINICAL DATA:  Neuro deficit, acute, stroke suspected EXAM: MRI HEAD WITHOUT CONTRAST TECHNIQUE: Multiplanar, multiecho pulse sequences of the brain and surrounding structures were obtained without intravenous contrast. COMPARISON:  CT head April 20, 2021. FINDINGS: Brain: No acute infarction, hemorrhage, hydrocephalus, extra-axial collection or mass lesion. Moderate patchy and confluent T2 hyperintensity in the supratentorial and pontine white matter, nonspecific but compatible with chronic microvascular ischemic disease. Vascular: Major arterial flow voids are maintained skull base. Skull and upper cervical spine: Normal marrow signal. Sinuses/Orbits: Clear visualized sinuses.  Unremarkable orbits. Other: No mastoid effusions. IMPRESSION: 1. No evidence of acute intracranial abnormality. 2. Moderate to advanced chronic microvascular ischemic disease. Electronically Signed   By: Margaretha Sheffield M.D.   On: 04/22/2021 08:41   US Carotid Bilateral  Result Date: 04/21/2021 CLINICAL DATA:  85 year old female with altered mental status, right-sided facial droop  EXAM: BILATERAL CAROTID DUPLEX ULTRASOUND TECHNIQUE: Pearline Cables scale imaging, color Doppler and duplex ultrasound were performed of bilateral carotid and vertebral arteries in the neck. COMPARISON:  Report from remote carotid duplex ultrasound dated 07/26/2008; 09/16/2011 FINDINGS: Criteria: Quantification of carotid stenosis is based on velocity parameters that correlate the residual internal carotid diameter with NASCET-based stenosis levels, using the diameter of the distal internal carotid lumen as the denominator for stenosis measurement. The following velocity measurements were obtained: RIGHT ICA: 154/32 cm/sec CCA: [id number] cm/sec SYSTOLIC ICA/CCA RATIO:  1.7 ECA:  101 cm/sec LEFT ICA: 210/38 cm/sec CCA: [id number] cm/sec SYSTOLIC ICA/CCA RATIO:  2.4 ECA:  99 cm/sec RIGHT CAROTID ARTERY: Heterogeneous atherosclerotic plaque in the proximal internal carotid artery. By peak systolic velocity criteria, the estimated stenosis is in the 50-69% diameter range. RIGHT VERTEBRAL ARTERY:  Patent with normal antegrade flow. LEFT CAROTID ARTERY: Heterogeneous atherosclerotic plaque in the proximal internal carotid artery. By peak systolic velocity criteria, the estimated stenosis falls in the 50-69% diameter range. LEFT VERTEBRAL ARTERY:  Patent with normal antegrade flow. IMPRESSION: 1. Moderate (50-69%) stenosis proximal right internal carotid artery secondary to heterogenous atherosclerotic plaque. 2. Moderate (50-69%) stenosis proximal left internal carotid artery secondary to heterogenous atherosclerotic plaque. 3. Vertebral arteries are patent with normal antegrade flow. 4. Degree of internal carotid artery stenosis is not significantly changed compared to prior imaging from 2010. Electronically Signed   By: Jacqulynn Cadet M.D.   On: 04/21/2021 10:46   US Venous Img Lower Bilateral (DVT)  Result Date: 04/21/2021 CLINICAL DATA:  Right calf swelling. Left leg pain. Left hip fracture in April 2022. EXAM: BILATERAL LOWER  EXTREMITY VENOUS DOPPLER ULTRASOUND TECHNIQUE: Gray-scale sonography with compression, as well as color and duplex ultrasound, were performed to evaluate the deep venous system(s) from the level of the common femoral vein through the popliteal and proximal calf veins. COMPARISON:  None. FINDINGS: VENOUS Normal compressibility of the common femoral, superficial femoral, and popliteal veins, as well as the visualized calf veins. Visualized portions of profunda femoral vein and great saphenous vein unremarkable. No filling defects to suggest DVT on grayscale or color Doppler imaging. Doppler waveforms show normal direction of venous flow, normal respiratory plasticity and response to augmentation. Limited views of the contralateral common femoral vein are unremarkable. OTHER Right calf subcutaneous edema. Limitations: none IMPRESSION: Negative. Electronically Signed   By: Claudie Revering M.D.   On: 04/21/2021 10:45   ECHOCARDIOGRAM COMPLETE  Result Date: 04/21/2021    ECHOCARDIOGRAM REPORT   Patient Name:   SERINE BEISER Date of Exam: 04/21/2021 Medical Rec #:  [id number]         Height:  62.0 in Accession #:    [id number]        Weight:       102.5 lb Date of Birth:  [birth year]         BSA:          1.439 m Patient Age:    32 years          BP:           172/31 mmHg Patient Gender: F                 HR:           35 bpm. Exam Location:  [facility] Procedure: 2D Echo, Cardiac Doppler and Color Doppler Indications:    Stroke  History:        Patient has prior history of Echocardiogram examinations, most                 recent 09/27/2020. CAD, COPD, Signs/Symptoms:Shortness of Breath;                 Risk Factors:Hypertension and Dyslipidemia. LAD Stent 2003.  Sonographer:    Wenda Low Referring Phys: [id number] OLADAPO ADEFESO IMPRESSIONS  1. Left ventricular ejection fraction, by estimation, is 60 to 65%. The left ventricle has normal function. The left ventricle has no regional wall motion abnormalities.  Left ventricular diastolic parameters are indeterminate.  2. Right ventricular systolic function is normal. The right ventricular size is normal. Mildly increased right ventricular wall thickness. There is mildly elevated pulmonary artery systolic pressure. The estimated right ventricular systolic pressure is [id number] mmHg.  3. The mitral valve is degenerative. Moderate mitral valve regurgitation. The mean mitral valve gradient is 6.0 mmHg with average heart rate of 38 bpm. No pulmonary vein assessment. Diastolic mitral regurgitation seen in the setting of heart block.  4. Left atrial size was severely dilated.  5. The pericardial effusion is posterior and lateral to the left ventricle.  6. The aortic valve is tricuspid. There is mild calcification of the aortic valve. There is mild thickening of the aortic valve. Aortic valve regurgitation is not visualized. Mild aortic valve stenosis. Aortic valve mean gradient measures 18.0 mmHg. Aortic valve Vmax measures 2.91 m/s. DVI 0.87 with normal LV stroke volume index.  7. Diastolic tricuspid regurgitation noted in the setting of heart block.  8. The inferior vena cava is normal in size with greater than 50% respiratory variability, suggesting right atrial pressure of 3 mmHg. Comparison(s): A prior study was performed on 09/27/20. Prior images reviewed side by side. Mitral regurgitation has increased; similar aortic valve parameters, imaged in sinus tachycardia in last study. FINDINGS  Left Ventricle: Left ventricular ejection fraction, by estimation, is 60 to 65%. The left ventricle has normal function. The left ventricle has no regional wall motion abnormalities. The left ventricular internal cavity size was normal in size. There is  no left ventricular hypertrophy. Left ventricular diastolic parameters are indeterminate. Right Ventricle: The right ventricular size is normal. Mildly increased right ventricular wall thickness. Right ventricular systolic function is normal.  There is mildly elevated pulmonary artery systolic pressure. The tricuspid regurgitant velocity is 2.86 m/s, and with an assumed right atrial pressure of 8 mmHg, the estimated right ventricular systolic pressure is [id number] mmHg. Left Atrium: Left atrial size was severely dilated. Right Atrium: Right atrial size was normal in size. Pericardium: Trivial pericardial effusion is present. The pericardial effusion is posterior and lateral to the left ventricle. Mitral Valve: The mitral valve  is degenerative in appearance. Moderate mitral valve regurgitation. MV peak gradient, 17.8 mmHg. The mean mitral valve gradient is 6.0 mmHg with average heart rate of 38 bpm. Tricuspid Valve: The tricuspid valve is normal in structure. Tricuspid valve regurgitation is mild . No evidence of tricuspid stenosis. Aortic Valve: The aortic valve is tricuspid. There is mild calcification of the aortic valve. There is mild thickening of the aortic valve. Aortic valve regurgitation is not visualized. Mild aortic stenosis is present. Aortic valve mean gradient measures  18.0 mmHg. Aortic valve peak gradient measures 33.9 mmHg. Aortic valve area, by VTI measures 1.86 cm. Pulmonic Valve: The pulmonic valve was normal in structure. Pulmonic valve regurgitation is not visualized. No evidence of pulmonic stenosis. Aorta: The aortic root is normal in size and structure. Venous: The inferior vena cava is normal in size with greater than 50% respiratory variability, suggesting right atrial pressure of 3 mmHg. IAS/Shunts: The atrial septum is grossly normal.  LEFT VENTRICLE PLAX 2D LVIDd:         4.40 cm   Diastology LVIDs:         2.90 cm   LV e' medial:   3.94 cm/s LV PW:         0.70 cm   LV E/e' medial: 36.5 LV IVS:        0.90 cm LVOT diam:     1.65 cm LV SV:         136 LV SV Index:   95 LVOT Area:     2.14 cm  RIGHT VENTRICLE RV Basal diam:  3.70 cm RV Mid diam:    2.60 cm RV S prime:     13.40 cm/s TAPSE (M-mode): 2.6 cm LEFT ATRIUM              Index        RIGHT ATRIUM           Index LA diam:        3.70 cm 2.57 cm/m   RA Area:     11.00 cm LA Vol (A2C):   74.2 ml 51.56 ml/m  RA Volume:   24.80 ml  17.23 ml/m LA Vol (A4C):   61.3 ml 42.60 ml/m LA Biplane Vol: 67.8 ml 47.12 ml/m  AORTIC VALVE                     PULMONIC VALVE AV Area (Vmax):    1.71 cm      PV Vmax:       1.46 m/s AV Area (Vmean):   1.73 cm      PV Peak grad:  8.5 mmHg AV Area (VTI):     1.86 cm AV Vmax:           291.00 cm/s AV Vmean:          180.500 cm/s AV VTI:            0.735 m AV Peak Grad:      33.9 mmHg AV Mean Grad:      18.0 mmHg LVOT Vmax:         233.00 cm/s LVOT Vmean:        146.000 cm/s LVOT VTI:          0.638 m LVOT/AV VTI ratio: 0.87  AORTA Ao Asc diam: 2.90 cm MITRAL VALVE                TRICUSPID VALVE MV Area (PHT): 2.37 cm  TR Peak grad:   32.7 mmHg MV Area VTI:   2.58 cm     TR Vmax:        286.00 cm/s MV Peak grad:  17.8 mmHg MV Mean grad:  6.0 mmHg     SHUNTS MV Vmax:       2.11 m/s     Systemic VTI:  0.64 m MV Vmean:      112.0 cm/s   Systemic Diam: 1.65 cm MV Decel Time: 320 msec MV E velocity: 144.00 cm/s MV A velocity: 154.00 cm/s MV E/A ratio:  0.94 Rudean Haskell MD Electronically signed by Rudean Haskell MD Signature Date/Time: 04/21/2021/11:02:38 AM    Final         Scheduled Meds:  aspirin  81 mg Oral BID   atorvastatin  10 mg Oral Daily   benazepril  10 mg Oral Daily   Chlorhexidine Gluconate Cloth  6 each Topical Daily   doxycycline  100 mg Oral Q12H   enoxaparin (LOVENOX) injection  40 mg Subcutaneous Q24H   hydrALAZINE  25 mg Oral Q8H   levothyroxine  25 mcg Oral Q0600   lubiprostone  24 mcg Oral Q breakfast   melatonin  9 mg Oral QHS   mometasone-formoterol  2 puff Inhalation BID   naloxegol oxalate  25 mg Oral Daily   pantoprazole  40 mg Oral Daily   polyethylene glycol  17 g Oral BH-q7a   raloxifene  60 mg Oral Daily   temazepam  30 mg Oral QHS     LOS: 1 day    Time spent: 35  minutes    Sheniya Garciaperez Darleen Crocker, DO [facility]  If 7PM-7AM, please contact night-coverage [email or web address] 04/22/2021, 11:17 AM

## 2021-04-22 NOTE — Progress Notes (Signed)
Patient arrived to unit via [facility] approx. 1515. Alert and oriented, VS obtained, assessment completed. Husband, Deniece Portela, contacted by this nurse to make him aware of patient arrival per patient's request. Patient has no c/o pain at this time, will continue to monitor.

## 2021-04-22 NOTE — Consult Note (Addendum)
Cardiology Consultation:   Patient ID: Mckenzie [name] MRN: [id number]; DOB: 02/18/1936  Admit date: 04/19/2021 Date of Consult: 04/22/2021  PCP:  Royann Shivers, PA-C   [facility] Providers Cardiologist:  Christell Constant, MD   {   Patient Profile:   Mckenzie SALMONS is a 85 y.o. female with a hx of CAD  (s/p stent LAD at [facility] 2003, patent on cath 2012), HTN, HLD, chronic CHF (diastolic), COPD, SVT who is being seen 04/22/2021 for the evaluation of Mobitz II block at the request of Dr. Wyline Mood.  History of Present Illness:   [name] was admitted to [facility] 04/19/21 with unusual SOB,vague palpitations found in 2:1 AV block, she was admitted her home lopressor held, no other nodal blocking agents noted.  She developed transient slurred speech and facial droop c/w TIA, CT with chronic changes, nothing acute, MRI negative for acute intracranial abnormality, noting Moderate to advanced chronic microvascular ischemic disease.  Neurology consulted via tele-medicine TIA, planned for CT angio (their note is incomplete), medicine note however reports getting CTa and OK to transfer to [facility] for EP consult, neurology was aware.  CTa apparently to help guide antiplatelet recommendations.  She remains in 2:1 AV block despite full washout of her lopressor She denies any CP, palpitations  LABS K+ 4.2 Mag 1.7  BUN/Creat 34/0.67 WBC 7.1 H/H 12/42 Plts 180  TSH 2.440   She is feeling OK, no symptoms of SOB or bradycardia at rest No new or recurrent neuro symptoms   Past Medical History:  Diagnosis Date   Aortic valve sclerosis    Mild, echo, April, 2012   ARDS (adult respiratory distress syndrome) (HCC) 2003   with tracheostomy-peritonitis   Arthritis    Back pain    Need for surgery, Dr. Trey Sailors   CAD (coronary artery disease)    Stent to LAD, 2003 [facility] / nuclear scan April, 201 to artifact / persistent shortness of breath / catheterization Oct 21, 2010... widely  patent stent to the mid LAD, no significant obstructive disease, should be low risk    for back surgery   Carotid arterial disease (HCC)    Moderate-Dr. Myra Gianotti following   Chronic back pain    COPD (chronic obstructive pulmonary disease) (HCC)    Diastolic dysfunction    Echo, April, 2011   EF 65%   Dysrhythmia    afib   Ejection fraction    EF 65%, echo, September 15, 2010   GERD (gastroesophageal reflux disease)    Hemolytic anemia (HCC)    Followed by Dr. Derald Macleod   Hypertension    Hypothyroidism    Left ventricular hypertrophy    EF 65%, echo, April, 2011   MR (mitral regurgitation)     Mild echo, 2012 ///  Mild per Echo 2009,  mild, echo, April, 2011   Myocardial infarction [place])    Palpitations    Holter, September 16, 2010, PACs and PVCs,one 4 beat run of SVT   Pneumonia 11-2011   Preoperative clearance    Preop clearance for back surgery   Shortness of breath    March, 2012   UTI (urinary tract infection)    Chronic-on Macrodantin    Past Surgical History:  Procedure Laterality Date   APPENDECTOMY     BACK SURGERY     BREAST LUMPECTOMY Left    CATARACT EXTRACTION W/PHACO Right 12/05/2012   Procedure: CATARACT EXTRACTION PHACO AND INTRAOCULAR LENS PLACEMENT (IOC);  Surgeon: Gemma Payor, MD;  Location:  [facility];  Service: Ophthalmology;  Laterality: Right;  CDE:15.30   CATARACT EXTRACTION W/PHACO Left 12/15/2012   Procedure: CATARACT EXTRACTION PHACO AND INTRAOCULAR LENS PLACEMENT (IOC);  Surgeon: Gemma Payor, MD;  Location: [facility];  Service: Ophthalmology;  Laterality: Left;  CDE:22.56   CORONARY ANGIOPLASTY WITH STENT PLACEMENT  2003   INTRAMEDULLARY (IM) NAIL INTERTROCHANTERIC Left 09/27/2020   Procedure: INTRAMEDULLARY (IM) NAIL INTERTROCHANTRIC;  Surgeon: Cammy Copa, MD;  Location: [facility];  Service: Orthopedics;  Laterality: Left;  left trochanter   repair of ruptured lumbar discs  12-2011   SPINE SURGERY     lumbar disk removal by Dr. Laurian Brim at [facility]    THYROID SURGERY  08/2009   Benign adenomas   TONSILLECTOMY     TRACHEOSTOMY  2003   Following ARDS      Home Medications:  Prior to Admission medications   Medication Sig Start Date End Date Taking? Authorizing Provider  acetaminophen (TYLENOL) 325 MG tablet Take 1-2 tablets (325-650 mg total) by mouth every 4 (four) hours as needed for mild pain. 10/11/20  Yes Love, Evlyn Kanner, PA-C  atorvastatin (LIPITOR) 10 MG tablet Take 10 mg by mouth daily.  07/22/12  Yes [provider]  benazepril (LOTENSIN) 5 MG tablet Take 5 mg by mouth daily.   Yes [provider]  Calcium Carbonate (CALCIUM 600 PO) Take 1,200 mg by mouth daily.   Yes [provider]  Cholecalciferol (VITAMIN D3) 1000 UNITS CAPS Take 1,000 Units by mouth daily. Take one tab daily   Yes [provider]  conjugated estrogens (PREMARIN) vaginal cream Place 1 Applicatorful vaginally daily.   Yes [provider]  diphenhydrAMINE (BENADRYL) 25 mg capsule Take 25 mg by mouth daily as needed for sleep.   Yes [provider]  esomeprazole (NEXIUM) 40 MG capsule Take 40 mg by mouth daily before breakfast.   Yes [provider]  estrogens, conjugated, (PREMARIN) 0.625 MG tablet Take 0.625 mg by mouth daily.   Yes [provider]  HYDROcodone-acetaminophen (NORCO) 10-325 MG tablet Take 1 tablet by mouth every 4 (four) hours as needed for severe pain. 10/11/20  Yes Love, Evlyn Kanner, PA-C  hyoscyamine (LEVSIN SL) 0.125 MG SL tablet Take 0.125 mg by mouth every 6 (six) hours as needed for cramping or diarrhea or loose stools.   Yes [provider]  levothyroxine (SYNTHROID) 25 MCG tablet TAKE 1 TABLET BY MOUTH DAILY AT 6 AM. Patient taking differently: Take 25 mcg by mouth daily before breakfast. 03/24/21  Yes Raulkar, Drema Pry, MD  lubiprostone (AMITIZA) 24 MCG capsule Take by mouth. 03/03/21  Yes [provider]  Melatonin 10 MG TABS Take 1 tablet by mouth at  bedtime.   Yes [provider]  methocarbamol (ROBAXIN) 500 MG tablet Take 1 tablet (500 mg total) by mouth every 6 (six) hours as needed for muscle spasms. 10/11/20  Yes Love, Evlyn Kanner, PA-C  metoprolol tartrate (LOPRESSOR) 25 MG tablet TAKE (1) TABLET TWICE DAILY. 10/12/20  Yes Raulkar, Drema Pry, MD  mometasone-formoterol (DULERA) 100-5 MCG/ACT AERO Inhale 2 puffs into the lungs 2 (two) times daily. 10/11/20  Yes Love, Evlyn Kanner, PA-C  MOVANTIK 25 MG TABS tablet Take 25 mg by mouth daily. 09/16/20  Yes [provider]  Multiple Vitamin (MULTIVITAMIN) tablet Take 1 tablet by mouth daily.   Yes [provider]  nitroGLYCERIN (NITROSTAT) 0.4 MG SL tablet Place under the tongue.   Yes [provider]  polyethylene  glycol (MIRALAX / GLYCOLAX) 17 g packet Take 17 g by mouth every morning.   Yes [provider]  raloxifene (EVISTA) 60 MG tablet Take 60 mg by mouth daily. 04/13/19  Yes [provider]  tamsulosin (FLOMAX) 0.4 MG CAPS capsule Take 0.4 mg by mouth daily. 04/06/19  Yes [provider]  temazepam (RESTORIL) 30 MG capsule Take 30 mg by mouth at bedtime.   Yes [provider]  traMADol (ULTRAM) 50 MG tablet 1  po q hs prn pain 01/20/21  Yes Cammy Copa, MD  traZODone (DESYREL) 50 MG tablet Take 50 mg by mouth at bedtime as needed for sleep. 06/28/19  Yes [provider]  tretinoin (RETIN-A) 0.05 % cream Apply 1 application topically at bedtime.   Yes [provider]  vitamin C (ASCORBIC ACID) 500 MG tablet Take 500 mg by mouth daily.   Yes [provider]  aspirin 81 MG chewable tablet Chew 1 tablet (81 mg total) by mouth 2 (two) times daily. Patient not taking: No sig reported 10/11/20   Love, Evlyn Kanner, PA-C  iron polysaccharides (NIFEREX) 150 MG capsule Take 1 capsule (150 mg total) by mouth daily. Patient not taking: No sig reported 10/11/20   Love, Evlyn Kanner, PA-C  Melatonin 10 MG CAPS Take by  mouth. Patient not taking: No sig reported    [provider]  sulfamethoxazole-trimethoprim (BACTRIM DS) 800-160 MG tablet Take 1 tablet by mouth 2 (two) times daily. Patient not taking: No sig reported 03/27/21   [provider]    Inpatient Medications: Scheduled Meds:  aspirin  81 mg Oral BID   atorvastatin  10 mg Oral Daily   benazepril  10 mg Oral Daily   Chlorhexidine Gluconate Cloth  6 each Topical Daily   doxycycline  100 mg Oral Q12H   hydrALAZINE  25 mg Oral Q8H   levothyroxine  25 mcg Oral Q0600   lubiprostone  24 mcg Oral Q breakfast   melatonin  9 mg Oral QHS   mometasone-formoterol  2 puff Inhalation BID   naloxegol oxalate  25 mg Oral Daily   pantoprazole  40 mg Oral Daily   polyethylene glycol  17 g Oral BH-q7a   raloxifene  60 mg Oral Daily   temazepam  30 mg Oral QHS   Continuous Infusions:  PRN Meds: docusate sodium, hydrALAZINE, HYDROcodone-acetaminophen, hyoscyamine, methocarbamol, ondansetron (ZOFRAN) IV, polyethylene glycol, traMADol, traZODone  Allergies:    Allergies  Allergen Reactions   Tetracycline Nausea Only    Social History:   Social History   Socioeconomic History   Marital status: Married    Spouse name: Not on file   Number of children: 1   Years of education: Not on file   Highest education level: Not on file  Occupational History   Occupation: Retired  Tobacco Use   Smoking status: Former    Packs/day: 1.00    Years: 20.00    Pack years: 20.00    Types: Cigarettes    Quit date: 06/15/2000    Years since quitting: 20.8   Smokeless tobacco: Never  Vaping Use   Vaping Use: Never used  Substance and Sexual Activity   Alcohol use: No   Drug use: No   Sexual activity: Not on file  Other Topics Concern   Not on file  Social History Narrative   Regular Exercise: Yes-Walk   Social Determinants of Health   Financial Resource Strain: Not on file  Food Insecurity: Not  on file  Transportation Needs: Not on  file  Physical Activity: Not on file  Stress: Not on file  Social Connections: Not on file  Intimate Partner Violence: Not on file    Family History:   Family History  Problem Relation Age of Onset   Heart failure Father    Diabetes Son      ROS:  Please see the history of present illness.  All other ROS reviewed and negative.     Physical Exam/Data:   Vitals:   04/22/21 0610 04/22/21 0730 04/22/21 1131 04/22/21 1520  BP: (!) 227/34   (!) 199/51  Pulse:    (!) 40  Resp: 20   18  Temp:  (!) 97.5 F (36.4 C) (!) 97.3 F (36.3 C) 98 F (36.7 C)  TempSrc:  Oral Oral Oral  SpO2:    100%  Weight:    49.2 kg  Height:     (1.575 m)    Intake/Output Summary (Last 24 hours) at 04/22/2021 1611 Last data filed at 04/22/2021 1530 Gross per 24 hour  Intake 880 ml  Output 500 ml  Net 380 ml   Last 3 Weights 04/22/2021 04/20/2021 04/20/2021  Weight (lbs) 108 lb 7.5 oz 102 lb 8.2 oz 102 lb 8.2 oz  Weight (kg) 49.2 kg 46.5 kg 46.5 kg     Body mass index is 19.84 kg/m.  General:  Well nourished, well developed, in no acute distress HEENT: normal Neck: no JVD Vascular: No carotid bruits Cardiac:  RRR; bradycardic, no murmurs, gallops or rubs Lungs:  CTA b/l, no wheezing, rhonchi or rales  Abd: soft, nontender  Ext: no edema Musculoskeletal:  No deformities, she is quite thin with advanced atrophy Skin: warm and dry  Neuro:  no focal abnormalities noted Psych:  Normal affect   EKG:  The EKG was personally reviewed and demonstrates:    CHB 39bpm, RBBB (old), QRS  Telemetry:  Telemetry was personally reviewed and demonstrates:   2:1 AVBlock 30's  Relevant CV Studies:  04/21/21: TTE IMPRESSIONS   1. Left ventricular ejection fraction, by estimation, is 60 to 65%. The  left ventricle has normal function. The left ventricle has no regional  wall motion abnormalities. Left ventricular diastolic parameters are  indeterminate.   2. Right ventricular systolic  function is normal. The right ventricular  size is normal. Mildly increased right ventricular wall thickness. There  is mildly elevated pulmonary artery systolic pressure. The estimated right  ventricular systolic pressure is  40.7 mmHg.   3. The mitral valve is degenerative. Moderate mitral valve regurgitation.  The mean mitral valve gradient is 6.0 mmHg with average heart rate of 38  bpm. No pulmonary vein assessment. Diastolic mitral regurgitation seen in  the setting of heart block.   4. Left atrial size was severely dilated.   5. The pericardial effusion is posterior and lateral to the left  ventricle.   6. The aortic valve is tricuspid. There is mild calcification of the  aortic valve. There is mild thickening of the aortic valve. Aortic valve  regurgitation is not visualized. Mild aortic valve stenosis. Aortic valve  mean gradient measures 18.0 mmHg.  Aortic valve Vmax measures 2.91 m/s. DVI 0.87 with normal LV stroke volume  index.   7. Diastolic tricuspid regurgitation noted in the setting of heart block.   8. The inferior vena cava is normal in size with greater than 50%  respiratory variability, suggesting right atrial pressure of 3 mmHg.  Comparison(s): A prior study was performed on 09/27/20. Prior images  reviewed side by side. Mitral regurgitation has increased; similar aortic  valve parameters, imaged in sinus tachycardia in last study.   Laboratory Data:  High Sensitivity Troponin:  No results for input(s): TROPONINIHS in the last 720 hours.   Chemistry Recent Labs  Lab 04/20/21 0141 04/21/21 0459 04/22/21 0403  NA 143 140 141  K 3.3* 4.2 4.2  CL 112* 111 112*  CO2 22 22 25   GLUCOSE 118* 95 88  BUN 29* 35* 34*  CREATININE 0.69 0.99 0.67  CALCIUM 8.7* 8.1* 8.0*  MG  --  1.6* 1.7  GFRNONAA >60 57* >60  ANIONGAP 9 7 4*    No results for input(s): PROT, ALBUMIN, AST, ALT, ALKPHOS, BILITOT in the last 168 hours. Lipids  Recent Labs  Lab 04/21/21 0459   CHOL 137  TRIG 90  HDL 63  LDLCALC 56  CHOLHDL 2.2    Hematology Recent Labs  Lab 04/20/21 0141 04/21/21 0459 04/22/21 0403  WBC 8.9 8.5 7.1  RBC 3.76* 3.82* 4.23  HGB 11.3* 11.5* 12.9  HCT 35.7* 37.9 42.3  MCV 94.9 99.2 100.0  MCH 30.1 30.1 30.5  MCHC 31.7 30.3 30.5  RDW 13.3 13.7 13.5  PLT 197 188 180   Thyroid  Recent Labs  Lab 04/20/21 0716  TSH 2.440    BNP Recent Labs  Lab 04/19/21 1648  BNP 863.0*    DDimer No results for input(s): DDIMER in the last 168 hours.   Radiology/Studies:   CT ANGIO HEAD NECK W WO CM Result Date: 04/22/2021 CLINICAL DATA:  Stroke/TIA, assess intracranial arteries Stroke/TIA, assess extracranial arteries; right facial droop and speech slurring EXAM: CT ANGIOGRAPHY HEAD AND NECK TECHNIQUE: Multidetector CT imaging of the head and neck was performed using the standard protocol during bolus administration of intravenous contrast. Multiplanar CT image reconstructions and MIPs were obtained to evaluate the vascular anatomy. Carotid stenosis measurements (when applicable) are obtained utilizing NASCET criteria, using the distal internal carotid diameter as the denominator. CONTRAST:  36mL OMNIPAQUE IOHEXOL 350 MG/ML SOLN COMPARISON:  Correlation made with recent CT and MR imaging FINDINGS: CTA NECK Motion artifact is present. Aortic arch: Calcified plaque along the included aortic arch and patent great vessel origins. No high-grade proximal subclavian artery stenosis. Right carotid system: Patent. Calcified plaque at the ICA origin. No definite hemodynamically significant stenosis within above limitation. Left carotid system: Patent. Mild calcified plaque along the distal common carotid and proximal internal carotid. No hemodynamically significant stenosis. Vertebral arteries: Patent and codominant. Probable mild stenosis at the right vertebral origin. Skeleton: Cervical spine degenerative changes. Chronic fracture of the posterior arch of C1 on  the right. Other neck: Unremarkable. Upper chest: Small to moderate left pleural effusion. Small right pleural effusion. Emphysema. Probable mild interstitial edema. Review of the MIP images confirms the above findings CTA HEAD Anterior circulation: Intracranial internal carotid arteries patent with minor calcified plaque. Anterior cerebral arteries are patent. Anterior communicating artery is present. Middle cerebral arteries are patent. Posterior circulation: Intracranial vertebral arteries are patent. Basilar artery is patent. Major cerebellar artery origins are patent. Posterior cerebral arteries are patent. Venous sinuses: Patent as allowed by contrast bolus timing. Review of the MIP images confirms the above findings IMPRESSION: No large vessel occlusion or hemodynamically significant stenosis. Left greater than right pleural effusions with probable mild interstitial edema. Emphysema. Electronically Signed   By: 74m M.D.   On: 04/22/2021 14:09   CT  HEAD WO CONTRAST ( ) Result Date: 04/20/2021 CLINICAL DATA:  Weakness and confusion EXAM: CT HEAD WITHOUT CONTRAST TECHNIQUE: Contiguous axial images were obtained from the base of the skull through the vertex without intravenous contrast. COMPARISON:  None. FINDINGS: Brain: No evidence of acute infarction, hemorrhage, hydrocephalus, extra-axial collection or mass lesion/mass effect. Chronic atrophic and ischemic changes are identified. Vascular: No hyperdense vessel or unexpected calcification. Skull: Normal. Negative for fracture or focal lesion. Sinuses/Orbits: No acute finding. Other: None. IMPRESSION: Chronic atrophic and ischemic changes without acute abnormality. Electronically Signed   By: Alcide Clever M.D.   On: 04/20/2021 20:59   MR BRAIN WO CONTRAST Result Date: 04/22/2021 CLINICAL DATA:  Neuro deficit, acute, stroke suspected EXAM: MRI HEAD WITHOUT CONTRAST TECHNIQUE: Multiplanar, multiecho pulse sequences of the brain and surrounding  structures were obtained without intravenous contrast. COMPARISON:  CT head April 20, 2021. FINDINGS: Brain: No acute infarction, hemorrhage, hydrocephalus, extra-axial collection or mass lesion. Moderate patchy and confluent T2 hyperintensity in the supratentorial and pontine white matter, nonspecific but compatible with chronic microvascular ischemic disease. Vascular: Major arterial flow voids are maintained skull base. Skull and upper cervical spine: Normal marrow signal. Sinuses/Orbits: Clear visualized sinuses.  Unremarkable orbits. Other: No mastoid effusions. IMPRESSION: 1. No evidence of acute intracranial abnormality. 2. Moderate to advanced chronic microvascular ischemic disease. Electronically Signed   By: Feliberto Harts M.D.   On: 04/22/2021 08:41   US Carotid Bilateral Result Date: 04/21/2021 CLINICAL DATA:  85 year old female with altered mental status, right-sided facial droop EXAM: BILATERAL CAROTID DUPLEX ULTRASOUND TECHNIQUE: Wallace Cullens scale imaging, color Doppler and duplex ultrasound were performed of bilateral carotid and vertebral arteries in the neck. COMPARISON:  Report from remote carotid duplex ultrasound dated 07/26/2008; 09/16/2011 FINDINGS: Criteria: Quantification of carotid stenosis is based on velocity parameters that correlate the residual internal carotid diameter with NASCET-based stenosis levels, using the diameter of the distal internal carotid lumen as the denominator for stenosis measurement. The following velocity measurements were obtained: RIGHT ICA: 154/32 cm/sec CCA: 92/12 cm/sec SYSTOLIC ICA/CCA RATIO:  1.7 ECA:  101 cm/sec LEFT ICA: 210/38 cm/sec CCA: 89/13 cm/sec SYSTOLIC ICA/CCA RATIO:  2.4 ECA:  99 cm/sec RIGHT CAROTID ARTERY: Heterogeneous atherosclerotic plaque in the proximal internal carotid artery. By peak systolic velocity criteria, the estimated stenosis is in the 50-69% diameter range. RIGHT VERTEBRAL ARTERY:  Patent with normal antegrade flow. LEFT  CAROTID ARTERY: Heterogeneous atherosclerotic plaque in the proximal internal carotid artery. By peak systolic velocity criteria, the estimated stenosis falls in the 50-69% diameter range. LEFT VERTEBRAL ARTERY:  Patent with normal antegrade flow. IMPRESSION: 1. Moderate (50-69%) stenosis proximal right internal carotid artery secondary to heterogenous atherosclerotic plaque. 2. Moderate (50-69%) stenosis proximal left internal carotid artery secondary to heterogenous atherosclerotic plaque. 3. Vertebral arteries are patent with normal antegrade flow. 4. Degree of internal carotid artery stenosis is not significantly changed compared to prior imaging from 2010. Electronically Signed   By: Malachy Moan M.D.   On: 04/21/2021 10:46   US Venous Img Lower Bilateral (DVT) Result Date: 04/21/2021 CLINICAL DATA:  Right calf swelling. Left leg pain. Left hip fracture in April 2022. EXAM: BILATERAL LOWER EXTREMITY VENOUS DOPPLER ULTRASOUND TECHNIQUE: Gray-scale sonography with compression, as well as color and duplex ultrasound, were performed to evaluate the deep venous system(s) from the level of the common femoral vein through the popliteal and proximal calf veins. COMPARISON:  None. FINDINGS: VENOUS Normal compressibility of the common femoral, superficial femoral, and popliteal veins, as well as  the visualized calf veins. Visualized portions of profunda femoral vein and great saphenous vein unremarkable. No filling defects to suggest DVT on grayscale or color Doppler imaging. Doppler waveforms show normal direction of venous flow, normal respiratory plasticity and response to augmentation. Limited views of the contralateral common femoral vein are unremarkable. OTHER Right calf subcutaneous edema. Limitations: none IMPRESSION: Negative. Electronically Signed   By: Beckie Salts M.D.   On: 04/21/2021 10:45   DG Chest Port 1 View Result Date: 04/19/2021 CLINICAL DATA:  Bilateral leg swelling EXAM: PORTABLE CHEST  1 VIEW COMPARISON:  09/27/2020 FINDINGS: Heart size and vascularity within normal limits. Negative for heart failure. Atherosclerotic calcification aortic arch. Spinal cord stimulator unchanged. Bibasilar airspace disease has developed since the prior study. No significant effusion. Underlying COPD IMPRESSION: COPD with bibasilar atelectasis/infiltrate. Electronically Signed   By: Marlan Palau M.D.   On: 04/19/2021 17:54     Assessment and Plan:   Symptomatic bradycardia Advanced heart block, mobitz II continues despite BB washout Baseline conduction system disease  Labs look ok, mag is a little low, will replace BP stable (high), she has meds available Dr. Johney Frame has seen and examined the patient The patient had lunch today Discussed PPM recommendation and rational for it, discussed the procedure with potential risks/benefits She is agreeable  PLEASE DO NOT START DAPT TONIGHT if that ends up the plan from neurology perspective     Risk Assessment/Risk Scores:    For questions or updates, please contact [facility] Please consult [email or web address] for contact info under    Signed, Sheilah Pigeon, PA-C  04/22/2021     I have seen, examined the patient, and reviewed the above assessment and plan.  Changes to above are made where necessary.  On exam, elderly, bradycardic.  The patient presents with 2:1 AV block without reversible cause.  I would therefore recommend PPM implantation. Risks, benefits, alternatives to pacemaker implantation were discussed in detail with the patient today. The patient understands that the risks include but are not limited to bleeding, infection, pneumothorax, perforation, tamponade, vascular damage, renal failure, MI, stroke, death,  and lead dislodgement and wishes to proceed. We will therefore schedule the procedure at the next available time.  I have tentatively scheduled for 7 am tomorrow with Dr Elberta Fortis.  Orders have been placed for the  procedure.   Co Sign: Hillis Range, MD 04/22/2021 5:31 PM

## 2021-04-22 NOTE — Progress Notes (Signed)
Progress Note  Patient Name: DUSTI TETRO Date of Encounter: 04/22/2021  [facility] Cardiologist: Christell Constant, MD   Subjective   No complaints  Inpatient Medications    Scheduled Meds:  aspirin  81 mg Oral BID   atorvastatin  10 mg Oral Daily   benazepril  10 mg Oral Daily   Chlorhexidine Gluconate Cloth  6 each Topical Daily   doxycycline  100 mg Oral Q12H   enoxaparin (LOVENOX) injection  40 mg Subcutaneous Q24H   hydrALAZINE  10 mg Oral Q8H   levothyroxine  25 mcg Oral Q0600   lubiprostone  24 mcg Oral Q breakfast   melatonin  9 mg Oral QHS   mometasone-formoterol  2 puff Inhalation BID   naloxegol oxalate  25 mg Oral Daily   pantoprazole  40 mg Oral Daily   polyethylene glycol  17 g Oral BH-q7a   raloxifene  60 mg Oral Daily   temazepam  30 mg Oral QHS   Continuous Infusions:  PRN Meds: docusate sodium, hydrALAZINE, HYDROcodone-acetaminophen, hyoscyamine, methocarbamol, ondansetron (ZOFRAN) IV, polyethylene glycol, traMADol, traZODone   Vital Signs    Vitals:   04/22/21 0600 04/22/21 0609 04/22/21 0610 04/22/21 0730  BP: (!) 219/33 (!) 227/34 (!) 227/34   Pulse:      Resp: (!) 23  20   Temp:    (!) 97.5 F (36.4 C)  TempSrc:    Oral  SpO2:      Weight:      Height:        Intake/Output Summary (Last 24 hours) at 04/22/2021 0908 Last data filed at 04/22/2021 0830 Gross per 24 hour  Intake --  Output 500 ml  Net -500 ml   Last 3 Weights 04/20/2021 04/20/2021 04/19/2021  Weight (lbs) 102 lb 8.2 oz 102 lb 8.2 oz 110 lb  Weight (kg) 46.5 kg 46.5 kg 49.896 kg      Telemetry    2:1 AV block - Personally Reviewed  ECG    N/a - Personally Reviewed  Physical Exam   GEN: No acute distress.   Neck: No JVD Cardiac: bradycardia  Respiratory: Clear to auscultation bilaterally. GI: Soft, nontender, non-distended  MS: No edema; No deformity. Neuro:  Nonfocal  Psych: Normal affect   Labs    High Sensitivity Troponin:  No results  for input(s): TROPONINIHS in the last 720 hours.   Chemistry Recent Labs  Lab 04/20/21 0141 04/21/21 0459 04/22/21 0403  NA 143 140 141  K 3.3* 4.2 4.2  CL 112* 111 112*  CO2 22 22 25   GLUCOSE 118* 95 88  BUN 29* 35* 34*  CREATININE 0.69 0.99 0.67  CALCIUM 8.7* 8.1* 8.0*  MG  --  1.6* 1.7  GFRNONAA >60 57* >60  ANIONGAP 9 7 4*    Lipids  Recent Labs  Lab 04/21/21 0459  CHOL 137  TRIG 90  HDL 63  LDLCALC 56  CHOLHDL 2.2    Hematology Recent Labs  Lab 04/20/21 0141 04/21/21 0459 04/22/21 0403  WBC 8.9 8.5 7.1  RBC 3.76* 3.82* 4.23  HGB 11.3* 11.5* 12.9  HCT 35.7* 37.9 42.3  MCV 94.9 99.2 100.0  MCH 30.1 30.1 30.5  MCHC 31.7 30.3 30.5  RDW 13.3 13.7 13.5  PLT 197 188 180   Thyroid  Recent Labs  Lab 04/20/21 0716  TSH 2.440    BNP Recent Labs  Lab 04/19/21 1648  BNP 863.0*    DDimer No results for input(s): DDIMER  in the last 168 hours.   Radiology    CT HEAD WO CONTRAST (5MM)  Result Date: 04/20/2021 CLINICAL DATA:  Weakness and confusion EXAM: CT HEAD WITHOUT CONTRAST TECHNIQUE: Contiguous axial images were obtained from the base of the skull through the vertex without intravenous contrast. COMPARISON:  None. FINDINGS: Brain: No evidence of acute infarction, hemorrhage, hydrocephalus, extra-axial collection or mass lesion/mass effect. Chronic atrophic and ischemic changes are identified. Vascular: No hyperdense vessel or unexpected calcification. Skull: Normal. Negative for fracture or focal lesion. Sinuses/Orbits: No acute finding. Other: None. IMPRESSION: Chronic atrophic and ischemic changes without acute abnormality. Electronically Signed   By: Inez Catalina M.D.   On: 04/20/2021 20:59   MR BRAIN WO CONTRAST  Result Date: 04/22/2021 CLINICAL DATA:  Neuro deficit, acute, stroke suspected EXAM: MRI HEAD WITHOUT CONTRAST TECHNIQUE: Multiplanar, multiecho pulse sequences of the brain and surrounding structures were obtained without intravenous contrast.  COMPARISON:  CT head April 20, 2021. FINDINGS: Brain: No acute infarction, hemorrhage, hydrocephalus, extra-axial collection or mass lesion. Moderate patchy and confluent T2 hyperintensity in the supratentorial and pontine white matter, nonspecific but compatible with chronic microvascular ischemic disease. Vascular: Major arterial flow voids are maintained skull base. Skull and upper cervical spine: Normal marrow signal. Sinuses/Orbits: Clear visualized sinuses.  Unremarkable orbits. Other: No mastoid effusions. IMPRESSION: 1. No evidence of acute intracranial abnormality. 2. Moderate to advanced chronic microvascular ischemic disease. Electronically Signed   By: Margaretha Sheffield M.D.   On: 04/22/2021 08:41   US Carotid Bilateral  Result Date: 04/21/2021 CLINICAL DATA:  86 year old female with altered mental status, right-sided facial droop EXAM: BILATERAL CAROTID DUPLEX ULTRASOUND TECHNIQUE: Pearline Cables scale imaging, color Doppler and duplex ultrasound were performed of bilateral carotid and vertebral arteries in the neck. COMPARISON:  Report from remote carotid duplex ultrasound dated 07/26/2008; 09/16/2011 FINDINGS: Criteria: Quantification of carotid stenosis is based on velocity parameters that correlate the residual internal carotid diameter with NASCET-based stenosis levels, using the diameter of the distal internal carotid lumen as the denominator for stenosis measurement. The following velocity measurements were obtained: RIGHT ICA: 154/32 cm/sec CCA: [id number] cm/sec SYSTOLIC ICA/CCA RATIO:  1.7 ECA:  101 cm/sec LEFT ICA: 210/38 cm/sec CCA: [id number] cm/sec SYSTOLIC ICA/CCA RATIO:  2.4 ECA:  99 cm/sec RIGHT CAROTID ARTERY: Heterogeneous atherosclerotic plaque in the proximal internal carotid artery. By peak systolic velocity criteria, the estimated stenosis is in the 50-69% diameter range. RIGHT VERTEBRAL ARTERY:  Patent with normal antegrade flow. LEFT CAROTID ARTERY: Heterogeneous atherosclerotic plaque in the  proximal internal carotid artery. By peak systolic velocity criteria, the estimated stenosis falls in the 50-69% diameter range. LEFT VERTEBRAL ARTERY:  Patent with normal antegrade flow. IMPRESSION: 1. Moderate (50-69%) stenosis proximal right internal carotid artery secondary to heterogenous atherosclerotic plaque. 2. Moderate (50-69%) stenosis proximal left internal carotid artery secondary to heterogenous atherosclerotic plaque. 3. Vertebral arteries are patent with normal antegrade flow. 4. Degree of internal carotid artery stenosis is not significantly changed compared to prior imaging from 2010. Electronically Signed   By: Jacqulynn Cadet M.D.   On: 04/21/2021 10:46   US Venous Img Lower Bilateral (DVT)  Result Date: 04/21/2021 CLINICAL DATA:  Right calf swelling. Left leg pain. Left hip fracture in April 2022. EXAM: BILATERAL LOWER EXTREMITY VENOUS DOPPLER ULTRASOUND TECHNIQUE: Gray-scale sonography with compression, as well as color and duplex ultrasound, were performed to evaluate the deep venous system(s) from the level of the common femoral vein through the popliteal and proximal calf veins. COMPARISON:  None.  FINDINGS: VENOUS Normal compressibility of the common femoral, superficial femoral, and popliteal veins, as well as the visualized calf veins. Visualized portions of profunda femoral vein and great saphenous vein unremarkable. No filling defects to suggest DVT on grayscale or color Doppler imaging. Doppler waveforms show normal direction of venous flow, normal respiratory plasticity and response to augmentation. Limited views of the contralateral common femoral vein are unremarkable. OTHER Right calf subcutaneous edema. Limitations: none IMPRESSION: Negative. Electronically Signed   By: Claudie Revering M.D.   On: 04/21/2021 10:45   ECHOCARDIOGRAM COMPLETE  Result Date: 04/21/2021    ECHOCARDIOGRAM REPORT   Patient Name:   VYLETTE WORK Date of Exam: 04/21/2021 Medical Rec #:  [id number]          Height:       62.0 in Accession #:    [id number]        Weight:       102.5 lb Date of Birth:  08/29/1935         BSA:          1.439 m Patient Age:    46 years          BP:           172/31 mmHg Patient Gender: F                 HR:           35 bpm. Exam Location:  [facility] Procedure: 2D Echo, Cardiac Doppler and Color Doppler Indications:    Stroke  History:        Patient has prior history of Echocardiogram examinations, most                 recent 09/27/2020. CAD, COPD, Signs/Symptoms:Shortness of Breath;                 Risk Factors:Hypertension and Dyslipidemia. LAD Stent 2003.  Sonographer:    Wenda Low Referring Phys: [id number] OLADAPO ADEFESO IMPRESSIONS  1. Left ventricular ejection fraction, by estimation, is 60 to 65%. The left ventricle has normal function. The left ventricle has no regional wall motion abnormalities. Left ventricular diastolic parameters are indeterminate.  2. Right ventricular systolic function is normal. The right ventricular size is normal. Mildly increased right ventricular wall thickness. There is mildly elevated pulmonary artery systolic pressure. The estimated right ventricular systolic pressure is [id number] mmHg.  3. The mitral valve is degenerative. Moderate mitral valve regurgitation. The mean mitral valve gradient is 6.0 mmHg with average heart rate of 38 bpm. No pulmonary vein assessment. Diastolic mitral regurgitation seen in the setting of heart block.  4. Left atrial size was severely dilated.  5. The pericardial effusion is posterior and lateral to the left ventricle.  6. The aortic valve is tricuspid. There is mild calcification of the aortic valve. There is mild thickening of the aortic valve. Aortic valve regurgitation is not visualized. Mild aortic valve stenosis. Aortic valve mean gradient measures 18.0 mmHg. Aortic valve Vmax measures 2.91 m/s. DVI 0.87 with normal LV stroke volume index.  7. Diastolic tricuspid regurgitation noted in the setting of heart  block.  8. The inferior vena cava is normal in size with greater than 50% respiratory variability, suggesting right atrial pressure of 3 mmHg. Comparison(s): A prior study was performed on 09/27/20. Prior images reviewed side by side. Mitral regurgitation has increased; similar aortic valve parameters, imaged in sinus tachycardia in last study. FINDINGS  Left Ventricle: Left ventricular ejection fraction,  by estimation, is 60 to 65%. The left ventricle has normal function. The left ventricle has no regional wall motion abnormalities. The left ventricular internal cavity size was normal in size. There is  no left ventricular hypertrophy. Left ventricular diastolic parameters are indeterminate. Right Ventricle: The right ventricular size is normal. Mildly increased right ventricular wall thickness. Right ventricular systolic function is normal. There is mildly elevated pulmonary artery systolic pressure. The tricuspid regurgitant velocity is 2.86 m/s, and with an assumed right atrial pressure of 8 mmHg, the estimated right ventricular systolic pressure is [id number] mmHg. Left Atrium: Left atrial size was severely dilated. Right Atrium: Right atrial size was normal in size. Pericardium: Trivial pericardial effusion is present. The pericardial effusion is posterior and lateral to the left ventricle. Mitral Valve: The mitral valve is degenerative in appearance. Moderate mitral valve regurgitation. MV peak gradient, 17.8 mmHg. The mean mitral valve gradient is 6.0 mmHg with average heart rate of 38 bpm. Tricuspid Valve: The tricuspid valve is normal in structure. Tricuspid valve regurgitation is mild . No evidence of tricuspid stenosis. Aortic Valve: The aortic valve is tricuspid. There is mild calcification of the aortic valve. There is mild thickening of the aortic valve. Aortic valve regurgitation is not visualized. Mild aortic stenosis is present. Aortic valve mean gradient measures  18.0 mmHg. Aortic valve peak gradient  measures 33.9 mmHg. Aortic valve area, by VTI measures 1.86 cm. Pulmonic Valve: The pulmonic valve was normal in structure. Pulmonic valve regurgitation is not visualized. No evidence of pulmonic stenosis. Aorta: The aortic root is normal in size and structure. Venous: The inferior vena cava is normal in size with greater than 50% respiratory variability, suggesting right atrial pressure of 3 mmHg. IAS/Shunts: The atrial septum is grossly normal.  LEFT VENTRICLE PLAX 2D LVIDd:         4.40 cm   Diastology LVIDs:         2.90 cm   LV e' medial:   3.94 cm/s LV PW:         0.70 cm   LV E/e' medial: 36.5 LV IVS:        0.90 cm LVOT diam:     1.65 cm LV SV:         136 LV SV Index:   95 LVOT Area:     2.14 cm  RIGHT VENTRICLE RV Basal diam:  3.70 cm RV Mid diam:    2.60 cm RV S prime:     13.40 cm/s TAPSE (M-mode): 2.6 cm LEFT ATRIUM             Index        RIGHT ATRIUM           Index LA diam:        3.70 cm 2.57 cm/m   RA Area:     11.00 cm LA Vol (A2C):   74.2 ml 51.56 ml/m  RA Volume:   24.80 ml  17.23 ml/m LA Vol (A4C):   61.3 ml 42.60 ml/m LA Biplane Vol: 67.8 ml 47.12 ml/m  AORTIC VALVE                     PULMONIC VALVE AV Area (Vmax):    1.71 cm      PV Vmax:       1.46 m/s AV Area (Vmean):   1.73 cm      PV Peak grad:  8.5 mmHg AV Area (VTI):     1.86  cm AV Vmax:           291.00 cm/s AV Vmean:          180.500 cm/s AV VTI:            0.735 m AV Peak Grad:      33.9 mmHg AV Mean Grad:      18.0 mmHg LVOT Vmax:         233.00 cm/s LVOT Vmean:        146.000 cm/s LVOT VTI:          0.638 m LVOT/AV VTI ratio: 0.87  AORTA Ao Asc diam: 2.90 cm MITRAL VALVE                TRICUSPID VALVE MV Area (PHT): 2.37 cm     TR Peak grad:   32.7 mmHg MV Area VTI:   2.58 cm     TR Vmax:        286.00 cm/s MV Peak grad:  17.8 mmHg MV Mean grad:  6.0 mmHg     SHUNTS MV Vmax:       2.11 m/s     Systemic VTI:  0.64 m MV Vmean:      112.0 cm/s   Systemic Diam: 1.65 cm MV Decel Time: 320 msec MV E velocity: 144.00 cm/s  MV A velocity: 154.00 cm/s MV E/A ratio:  0.94 Rudean Haskell MD Electronically signed by Rudean Haskell MD Signature Date/Time: 04/21/2021/11:02:38 AM    Final     Cardiac Studies    Patient Profile     SHAKEDA KEINATH is a 85 y.o. female with a hx of CAD who is being seen 04/21/2021 for the evaluation of bradycardia at the request of Dr. Manuella Ghazi.  Assessment & Plan    1.Bradycardia - admitted with 2:1 AV block - on metoprolol at home, held since 04/19/21 - remains in 2:1 AV block, will need EP evaluation for probable pacemaker once neurology workup completed for TIA.   2. TIA - ongoing workup per primary team - CT head not acute - MRI no evidence of acute process. Moderate chronic microvascular changes - carotid US moderate bilateral disease   3. History of CAD - LAD stent in 2003 last cath 2012 patent stent - no active issues  4. Chronic diastolic HF - echo LVE [id number] no WMAs, indet diastolic function, normal RV,mod MR, mod MS - no active issues.   5. Valvular heart disease - echo mod MR, mod MS, mild AS mean grad 18, AVA  VTI 1.86  6. HTN - elevated bp's.  - startred on benazepril 10mg  yesterday. On hydralazine 10mg  tid - increase hydralazine to 25mg  tid  For questions or updates, please contact [facility] Please consult [email or web address] for contact info under        Signed, Carlyle Dolly, MD  04/22/2021, 9:08 AM

## 2021-04-23 ENCOUNTER — Encounter (HOSPITAL_COMMUNITY)
Admission: EM | Disposition: A | Discharge status: Home / Self Care | Payer: Self-pay | Source: Home / Self Care | Attending: Internal Medicine

## 2021-04-23 ENCOUNTER — Encounter (HOSPITAL_COMMUNITY): Payer: Self-pay | Admitting: Cardiology

## 2021-04-23 HISTORY — PX: PACEMAKER IMPLANT: EP1218

## 2021-04-23 LAB — CBC
HCT: 40.8 % (ref 36.0–46.0)
Hemoglobin: 13.2 g/dL (ref 12.0–15.0)
MCH: 30.5 pg (ref 26.0–34.0)
MCHC: 32.4 g/dL (ref 30.0–36.0)
MCV: 94.2 fL (ref 80.0–100.0)
Platelets: 183 10*3/uL (ref 150–400)
RBC: 4.33 MIL/uL (ref 3.87–5.11)
RDW: 13.6 % (ref 11.5–15.5)
WBC: 8.3 10*3/uL (ref 4.0–10.5)
nRBC: 0 % (ref 0.0–0.2)

## 2021-04-23 LAB — BASIC METABOLIC PANEL
Anion gap: 7 (ref 5–15)
BUN: 24 mg/dL — ABNORMAL HIGH (ref 8–23)
CO2: 22 mmol/L (ref 22–32)
Calcium: 8 mg/dL — ABNORMAL LOW (ref 8.9–10.3)
Chloride: 111 mmol/L (ref 98–111)
Creatinine, Ser: 0.68 mg/dL (ref 0.44–1.00)
GFR, Estimated: >60 mL/min (ref >60)
Glucose, Bld: 88 mg/dL (ref 70–99)
Potassium: 4.5 mmol/L (ref 3.5–5.1)
Sodium: 140 mmol/L (ref 135–145)

## 2021-04-23 LAB — MAGNESIUM: Magnesium: 2 mg/dL (ref 1.7–2.4)

## 2021-04-23 SURGERY — PACEMAKER IMPLANT

## 2021-04-23 MED ORDER — CEFAZOLIN SODIUM-DEXTROSE 2-4 GM/100ML-% IV SOLN
INTRAVENOUS | Status: AC
  Filled 2021-04-23: qty 100

## 2021-04-23 MED ORDER — HEPARIN (PORCINE) IN NACL 1000-0.9 UT/500ML-% IV SOLN
INTRAVENOUS | Status: AC
  Filled 2021-04-23: qty 500

## 2021-04-23 MED ORDER — LIDOCAINE HCL (PF) 1 % IJ SOLN
INTRAMUSCULAR | Status: AC
  Filled 2021-04-23: qty 60

## 2021-04-23 MED ORDER — SODIUM CHLORIDE 0.9 % IV SOLN
INTRAVENOUS | Status: AC
  Filled 2021-04-23: qty 2

## 2021-04-23 MED ORDER — CEFAZOLIN SODIUM-DEXTROSE 1-4 GM/50ML-% IV SOLN
1.0000 g | Freq: Four times a day (QID) | INTRAVENOUS | Status: AC
  Administered 2021-04-23 (×2): 1 g via INTRAVENOUS
  Filled 2021-04-23 (×3): qty 50

## 2021-04-23 MED ORDER — ONDANSETRON HCL 4 MG/2ML IJ SOLN
4.0000 mg | Freq: Four times a day (QID) | INTRAMUSCULAR | Status: DC | PRN
  Administered 2021-04-23: 4 mg via INTRAVENOUS
  Filled 2021-04-23: qty 2

## 2021-04-23 MED ORDER — ACETAMINOPHEN 325 MG PO TABS
325.0000 mg | ORAL_TABLET | ORAL | Status: DC | PRN
  Administered 2021-04-23 (×2): 325 mg via ORAL
  Filled 2021-04-23 (×2): qty 1

## 2021-04-23 MED ORDER — LIDOCAINE HCL (PF) 1 % IJ SOLN
INTRAMUSCULAR | Status: DC | PRN
  Administered 2021-04-23: 60 mL

## 2021-04-23 MED ORDER — HEPARIN (PORCINE) IN NACL 1000-0.9 UT/500ML-% IV SOLN
INTRAVENOUS | Status: DC | PRN
  Administered 2021-04-23: 500 mL

## 2021-04-23 MED ORDER — ATORVASTATIN CALCIUM 40 MG PO TABS
40.0000 mg | ORAL_TABLET | Freq: Every day | ORAL | Status: DC
  Administered 2021-04-24: 40 mg via ORAL
  Filled 2021-04-23: qty 1

## 2021-04-23 SURGICAL SUPPLY — 11 items
CABLE SURGICAL S-101-97-12 (CABLE) ×2 IMPLANT
LEAD TENDRIL MRI 46CM LPA1200M (Lead) ×1 IMPLANT
LEAD TENDRIL MRI 52CM LPA1200M (Lead) ×1 IMPLANT
MAT PREVALON FULL STRYKER (MISCELLANEOUS) ×1 IMPLANT
PACEMAKER ASSURITY DR-RF (Pacemaker) ×1 IMPLANT
PAD PRO RADIOLUCENT 2001M-C (PAD) ×2 IMPLANT
SHEATH 7FR PRELUDE SNAP 13 (SHEATH) IMPLANT
SHEATH 8FR PRELUDE SNAP 13 (SHEATH) ×2 IMPLANT
SHEATH PROBE COVER 6X72 (BAG) ×1 IMPLANT
TRAY PACEMAKER INSERTION (PACKS) ×2 IMPLANT
WIRE HITORQ VERSACORE ST 145CM (WIRE) ×1 IMPLANT

## 2021-04-23 NOTE — H&P (View-Only) (Signed)
Progress Note  Patient Name: Mckenzie [name] Date of Encounter: 04/23/2021  [facility] Cardiologist: Werner Lean, MD   Subjective   Feeling well without complaint  Inpatient Medications    Scheduled Meds:  aspirin  81 mg Oral BID   atorvastatin  10 mg Oral Daily   benazepril  10 mg Oral Daily   Chlorhexidine Gluconate Cloth  6 each Topical Daily   doxycycline  100 mg Oral Q12H   gentamicin irrigation  80 mg Irrigation On Call   hydrALAZINE  25 mg Oral Q8H   levothyroxine  25 mcg Oral Q0600   lubiprostone  24 mcg Oral Q breakfast   melatonin  9 mg Oral QHS   mometasone-formoterol  2 puff Inhalation BID   naloxegol oxalate  25 mg Oral Daily   pantoprazole  40 mg Oral Daily   polyethylene glycol  17 g Oral BH-q7a   raloxifene  60 mg Oral Daily   sodium chloride flush  3 mL Intravenous Q12H   temazepam  30 mg Oral QHS   Continuous Infusions:  sodium chloride 50 mL/hr at 04/23/21 0016   sodium chloride 250 mL (04/23/21 0558)    ceFAZolin (ANCEF) IV     PRN Meds: docusate sodium, hydrALAZINE, HYDROcodone-acetaminophen, hyoscyamine, methocarbamol, ondansetron (ZOFRAN) IV, polyethylene glycol, sodium chloride flush, traMADol, traZODone   Vital Signs    Vitals:   04/22/21 1520 04/22/21 2003 04/22/21 2348 04/23/21 0355  BP: (!) 199/51 (!) 191/47 (!) 175/41 (!) 185/60  Pulse: (!) 40 (!) 45 (!) 43 (!) 41  Resp: 18 20 20 20   Temp: 98 F (36.7 C) 97.7 F (36.5 C) 98.6 F (37 C) 97.7 F (36.5 C)  TempSrc: Oral Oral Oral   SpO2: 100% 100% 97% 100%  Weight: 49.2 kg   46.5 kg  Height: 5\' 2"  (1.575 m)       Intake/Output Summary (Last 24 hours) at 04/23/2021 0704 Last data filed at 04/22/2021 1530 Gross per 24 hour  Intake 880 ml  Output 325 ml  Net 555 ml   Last 3 Weights 04/23/2021 04/22/2021 04/20/2021  Weight (lbs) 102 lb 8.2 oz 108 lb 7.5 oz 102 lb 8.2 oz  Weight (kg) 46.5 kg 49.2 kg 46.5 kg      Telemetry    2:1 AV block - Personally  Reviewed  ECG    2:1 AV block - Personally Reviewed  Physical Exam   GEN: No acute distress.   Neck: No JVD Cardiac: RRR, no murmurs, rubs, or gallops.  Respiratory: Clear to auscultation bilaterally. GI: Soft, nontender, non-distended  MS: No edema; No deformity. Neuro:  Nonfocal  Psych: Normal affect   Labs    High Sensitivity Troponin:  No results for input(s): TROPONINIHS in the last 720 hours.   Chemistry Recent Labs  Lab 04/21/21 0459 04/22/21 0403 04/23/21 0345  NA 140 141 140  K 4.2 4.2 4.5  CL 111 112* 111  CO2 22 25 22   GLUCOSE 95 88 88  BUN 35* 34* 24*  CREATININE 0.99 0.67 0.68  CALCIUM 8.1* 8.0* 8.0*  MG 1.6* 1.7 2.0  GFRNONAA 57* >60 >60  ANIONGAP 7 4* 7    Lipids  Recent Labs  Lab 04/21/21 0459  CHOL 137  TRIG 90  HDL 63  LDLCALC 56  CHOLHDL 2.2    Hematology Recent Labs  Lab 04/21/21 0459 04/22/21 0403 04/23/21 0345  WBC 8.5 7.1 8.3  RBC 3.82* 4.23 4.33  HGB 11.5* 12.9 13.2  HCT 37.9 42.3 40.8  MCV 99.2 100.0 94.2  MCH 30.1 30.5 30.5  MCHC 30.3 30.5 32.4  RDW 13.7 13.5 13.6  PLT 188 180 183   Thyroid  Recent Labs  Lab 04/20/21 0716  TSH 2.440    BNP Recent Labs  Lab 04/19/21 1648  BNP 863.0*    DDimer No results for input(s): DDIMER in the last 168 hours.   Radiology    CT ANGIO HEAD NECK W WO CM  Result Date: 04/22/2021 CLINICAL DATA:  Stroke/TIA, assess intracranial arteries Stroke/TIA, assess extracranial arteries; right facial droop and speech slurring EXAM: CT ANGIOGRAPHY HEAD AND NECK TECHNIQUE: Multidetector CT imaging of the head and neck was performed using the standard protocol during bolus administration of intravenous contrast. Multiplanar CT image reconstructions and MIPs were obtained to evaluate the vascular anatomy. Carotid stenosis measurements (when applicable) are obtained utilizing NASCET criteria, using the distal internal carotid diameter as the denominator. CONTRAST:  32mL OMNIPAQUE IOHEXOL 350  MG/ML SOLN COMPARISON:  Correlation made with recent CT and MR imaging FINDINGS: CTA NECK Motion artifact is present. Aortic arch: Calcified plaque along the included aortic arch and patent great vessel origins. No high-grade proximal subclavian artery stenosis. Right carotid system: Patent. Calcified plaque at the ICA origin. No definite hemodynamically significant stenosis within above limitation. Left carotid system: Patent. Mild calcified plaque along the distal common carotid and proximal internal carotid. No hemodynamically significant stenosis. Vertebral arteries: Patent and codominant. Probable mild stenosis at the right vertebral origin. Skeleton: Cervical spine degenerative changes. Chronic fracture of the posterior arch of C1 on the right. Other neck: Unremarkable. Upper chest: Small to moderate left pleural effusion. Small right pleural effusion. Emphysema. Probable mild interstitial edema. Review of the MIP images confirms the above findings CTA HEAD Anterior circulation: Intracranial internal carotid arteries patent with minor calcified plaque. Anterior cerebral arteries are patent. Anterior communicating artery is present. Middle cerebral arteries are patent. Posterior circulation: Intracranial vertebral arteries are patent. Basilar artery is patent. Major cerebellar artery origins are patent. Posterior cerebral arteries are patent. Venous sinuses: Patent as allowed by contrast bolus timing. Review of the MIP images confirms the above findings IMPRESSION: No large vessel occlusion or hemodynamically significant stenosis. Left greater than right pleural effusions with probable mild interstitial edema. Emphysema. Electronically Signed   By: Macy Mis M.D.   On: 04/22/2021 14:09   MR BRAIN WO CONTRAST  Result Date: 04/22/2021 CLINICAL DATA:  Neuro deficit, acute, stroke suspected EXAM: MRI HEAD WITHOUT CONTRAST TECHNIQUE: Multiplanar, multiecho pulse sequences of the brain and surrounding  structures were obtained without intravenous contrast. COMPARISON:  CT head April 20, 2021. FINDINGS: Brain: No acute infarction, hemorrhage, hydrocephalus, extra-axial collection or mass lesion. Moderate patchy and confluent T2 hyperintensity in the supratentorial and pontine white matter, nonspecific but compatible with chronic microvascular ischemic disease. Vascular: Major arterial flow voids are maintained skull base. Skull and upper cervical spine: Normal marrow signal. Sinuses/Orbits: Clear visualized sinuses.  Unremarkable orbits. Other: No mastoid effusions. IMPRESSION: 1. No evidence of acute intracranial abnormality. 2. Moderate to advanced chronic microvascular ischemic disease. Electronically Signed   By: Margaretha Sheffield M.D.   On: 04/22/2021 08:41   US Carotid Bilateral  Result Date: 04/21/2021 CLINICAL DATA:  85 year old female with altered mental status, right-sided facial droop EXAM: BILATERAL CAROTID DUPLEX ULTRASOUND TECHNIQUE: Pearline Cables scale imaging, color Doppler and duplex ultrasound were performed of bilateral carotid and vertebral arteries in the neck. COMPARISON:  Report from remote carotid duplex ultrasound dated  07/26/2008; 09/16/2011 FINDINGS: Criteria: Quantification of carotid stenosis is based on velocity parameters that correlate the residual internal carotid diameter with NASCET-based stenosis levels, using the diameter of the distal internal carotid lumen as the denominator for stenosis measurement. The following velocity measurements were obtained: RIGHT ICA: 154/32 cm/sec CCA: [id number] cm/sec SYSTOLIC ICA/CCA RATIO:  1.7 ECA:  101 cm/sec LEFT ICA: 210/38 cm/sec CCA: [id number] cm/sec SYSTOLIC ICA/CCA RATIO:  2.4 ECA:  99 cm/sec RIGHT CAROTID ARTERY: Heterogeneous atherosclerotic plaque in the proximal internal carotid artery. By peak systolic velocity criteria, the estimated stenosis is in the 50-69% diameter range. RIGHT VERTEBRAL ARTERY:  Patent with normal antegrade flow. LEFT  CAROTID ARTERY: Heterogeneous atherosclerotic plaque in the proximal internal carotid artery. By peak systolic velocity criteria, the estimated stenosis falls in the 50-69% diameter range. LEFT VERTEBRAL ARTERY:  Patent with normal antegrade flow. IMPRESSION: 1. Moderate (50-69%) stenosis proximal right internal carotid artery secondary to heterogenous atherosclerotic plaque. 2. Moderate (50-69%) stenosis proximal left internal carotid artery secondary to heterogenous atherosclerotic plaque. 3. Vertebral arteries are patent with normal antegrade flow. 4. Degree of internal carotid artery stenosis is not significantly changed compared to prior imaging from 2010. Electronically Signed   By: Jacqulynn Cadet M.D.   On: 04/21/2021 10:46   US Venous Img Lower Bilateral (DVT)  Result Date: 04/21/2021 CLINICAL DATA:  Right calf swelling. Left leg pain. Left hip fracture in April 2022. EXAM: BILATERAL LOWER EXTREMITY VENOUS DOPPLER ULTRASOUND TECHNIQUE: Gray-scale sonography with compression, as well as color and duplex ultrasound, were performed to evaluate the deep venous system(s) from the level of the common femoral vein through the popliteal and proximal calf veins. COMPARISON:  None. FINDINGS: VENOUS Normal compressibility of the common femoral, superficial femoral, and popliteal veins, as well as the visualized calf veins. Visualized portions of profunda femoral vein and great saphenous vein unremarkable. No filling defects to suggest DVT on grayscale or color Doppler imaging. Doppler waveforms show normal direction of venous flow, normal respiratory plasticity and response to augmentation. Limited views of the contralateral common femoral vein are unremarkable. OTHER Right calf subcutaneous edema. Limitations: none IMPRESSION: Negative. Electronically Signed   By: Claudie Revering M.D.   On: 04/21/2021 10:45   ECHOCARDIOGRAM COMPLETE  Result Date: 04/21/2021    ECHOCARDIOGRAM REPORT   Patient Name:   VONDA RIZO Date of Exam: 04/21/2021 Medical Rec #:  [id number]         Height:       62.0 in Accession #:    [id number]        Weight:       102.5 lb Date of Birth:  06-21-1935         BSA:          1.439 m Patient Age:    17 years          BP:           172/31 mmHg Patient Gender: F                 HR:           35 bpm. Exam Location:  [facility] Procedure: 2D Echo, Cardiac Doppler and Color Doppler Indications:    Stroke  History:        Patient has prior history of Echocardiogram examinations, most                 recent 09/27/2020. CAD, COPD, Signs/Symptoms:Shortness of Breath;  Risk Factors:Hypertension and Dyslipidemia. LAD Stent 2003.  Sonographer:    Wenda Low Referring Phys: [id number] OLADAPO ADEFESO IMPRESSIONS  1. Left ventricular ejection fraction, by estimation, is 60 to 65%. The left ventricle has normal function. The left ventricle has no regional wall motion abnormalities. Left ventricular diastolic parameters are indeterminate.  2. Right ventricular systolic function is normal. The right ventricular size is normal. Mildly increased right ventricular wall thickness. There is mildly elevated pulmonary artery systolic pressure. The estimated right ventricular systolic pressure is [id number] mmHg.  3. The mitral valve is degenerative. Moderate mitral valve regurgitation. The mean mitral valve gradient is 6.0 mmHg with average heart rate of 38 bpm. No pulmonary vein assessment. Diastolic mitral regurgitation seen in the setting of heart block.  4. Left atrial size was severely dilated.  5. The pericardial effusion is posterior and lateral to the left ventricle.  6. The aortic valve is tricuspid. There is mild calcification of the aortic valve. There is mild thickening of the aortic valve. Aortic valve regurgitation is not visualized. Mild aortic valve stenosis. Aortic valve mean gradient measures 18.0 mmHg. Aortic valve Vmax measures 2.91 m/s. DVI 0.87 with normal LV stroke volume index.  7.  Diastolic tricuspid regurgitation noted in the setting of heart block.  8. The inferior vena cava is normal in size with greater than 50% respiratory variability, suggesting right atrial pressure of 3 mmHg. Comparison(s): A prior study was performed on 09/27/20. Prior images reviewed side by side. Mitral regurgitation has increased; similar aortic valve parameters, imaged in sinus tachycardia in last study. FINDINGS  Left Ventricle: Left ventricular ejection fraction, by estimation, is 60 to 65%. The left ventricle has normal function. The left ventricle has no regional wall motion abnormalities. The left ventricular internal cavity size was normal in size. There is  no left ventricular hypertrophy. Left ventricular diastolic parameters are indeterminate. Right Ventricle: The right ventricular size is normal. Mildly increased right ventricular wall thickness. Right ventricular systolic function is normal. There is mildly elevated pulmonary artery systolic pressure. The tricuspid regurgitant velocity is 2.86 m/s, and with an assumed right atrial pressure of 8 mmHg, the estimated right ventricular systolic pressure is [id number] mmHg. Left Atrium: Left atrial size was severely dilated. Right Atrium: Right atrial size was normal in size. Pericardium: Trivial pericardial effusion is present. The pericardial effusion is posterior and lateral to the left ventricle. Mitral Valve: The mitral valve is degenerative in appearance. Moderate mitral valve regurgitation. MV peak gradient, 17.8 mmHg. The mean mitral valve gradient is 6.0 mmHg with average heart rate of 38 bpm. Tricuspid Valve: The tricuspid valve is normal in structure. Tricuspid valve regurgitation is mild . No evidence of tricuspid stenosis. Aortic Valve: The aortic valve is tricuspid. There is mild calcification of the aortic valve. There is mild thickening of the aortic valve. Aortic valve regurgitation is not visualized. Mild aortic stenosis is present. Aortic valve  mean gradient measures  18.0 mmHg. Aortic valve peak gradient measures 33.9 mmHg. Aortic valve area, by VTI measures 1.86 cm. Pulmonic Valve: The pulmonic valve was normal in structure. Pulmonic valve regurgitation is not visualized. No evidence of pulmonic stenosis. Aorta: The aortic root is normal in size and structure. Venous: The inferior vena cava is normal in size with greater than 50% respiratory variability, suggesting right atrial pressure of 3 mmHg. IAS/Shunts: The atrial septum is grossly normal.  LEFT VENTRICLE PLAX 2D LVIDd:         4.40 cm   Diastology LVIDs:  2.90 cm   LV e' medial:   3.94 cm/s LV PW:         0.70 cm   LV E/e' medial: 36.5 LV IVS:        0.90 cm LVOT diam:     1.65 cm LV SV:         136 LV SV Index:   95 LVOT Area:     2.14 cm  RIGHT VENTRICLE RV Basal diam:  3.70 cm RV Mid diam:    2.60 cm RV S prime:     13.40 cm/s TAPSE (M-mode): 2.6 cm LEFT ATRIUM             Index        RIGHT ATRIUM           Index LA diam:        3.70 cm 2.57 cm/m   RA Area:     11.00 cm LA Vol (A2C):   74.2 ml 51.56 ml/m  RA Volume:   24.80 ml  17.23 ml/m LA Vol (A4C):   61.3 ml 42.60 ml/m LA Biplane Vol: 67.8 ml 47.12 ml/m  AORTIC VALVE                     PULMONIC VALVE AV Area (Vmax):    1.71 cm      PV Vmax:       1.46 m/s AV Area (Vmean):   1.73 cm      PV Peak grad:  8.5 mmHg AV Area (VTI):     1.86 cm AV Vmax:           291.00 cm/s AV Vmean:          180.500 cm/s AV VTI:            0.735 m AV Peak Grad:      33.9 mmHg AV Mean Grad:      18.0 mmHg LVOT Vmax:         233.00 cm/s LVOT Vmean:        146.000 cm/s LVOT VTI:          0.638 m LVOT/AV VTI ratio: 0.87  AORTA Ao Asc diam: 2.90 cm MITRAL VALVE                TRICUSPID VALVE MV Area (PHT): 2.37 cm     TR Peak grad:   32.7 mmHg MV Area VTI:   2.58 cm     TR Vmax:        286.00 cm/s MV Peak grad:  17.8 mmHg MV Mean grad:  6.0 mmHg     SHUNTS MV Vmax:       2.11 m/s     Systemic VTI:  0.64 m MV Vmean:      112.0 cm/s   Systemic  Diam: 1.65 cm MV Decel Time: 320 msec MV E velocity: 144.00 cm/s MV A velocity: 154.00 cm/s MV E/A ratio:  0.94 Rudean Haskell MD Electronically signed by Rudean Haskell MD Signature Date/Time: 04/21/2021/11:02:38 AM    Final     Cardiac Studies   TTE 04/21/21  1. Left ventricular ejection fraction, by estimation, is 60 to 65%. The  left ventricle has normal function. The left ventricle has no regional  wall motion abnormalities. Left ventricular diastolic parameters are  indeterminate.   2. Right ventricular systolic function is normal. The right ventricular  size is normal. Mildly increased right ventricular wall thickness. There  is mildly elevated pulmonary  artery systolic pressure. The estimated right  ventricular systolic pressure is  40.7 mmHg.   3. The mitral valve is degenerative. Moderate mitral valve regurgitation.  The mean mitral valve gradient is 6.0 mmHg with average heart rate of 38  bpm. No pulmonary vein assessment. Diastolic mitral regurgitation seen in  the setting of heart block.   4. Left atrial size was severely dilated.   5. The pericardial effusion is posterior and lateral to the left  ventricle.   6. The aortic valve is tricuspid. There is mild calcification of the  aortic valve. There is mild thickening of the aortic valve. Aortic valve  regurgitation is not visualized. Mild aortic valve stenosis. Aortic valve  mean gradient measures 18.0 mmHg.  Aortic valve Vmax measures 2.91 m/s. DVI 0.87 with normal LV stroke volume  index.   7. Diastolic tricuspid regurgitation noted in the setting of heart block.   8. The inferior vena cava is normal in size with greater than 50%  respiratory variability, suggesting right atrial pressure of 3 mmHg.   Patient Profile     85 y.o. female with second degree heart block  Assessment & Plan    Second degree AV block: no reversible causes. Plan for pacemaker implant KIAYA HALIBURTON has presented today for  surgery, with the diagnosis of second degree AV block.  The various methods of treatment have been discussed with the patient and family. After consideration of risks, benefits and other options for treatment, the patient has consented to  Procedure(s): Pacemaker implant as a surgical intervention .  Risks include but not limited to bleeding, infection, pneumothorax, perforation, tamponade, vascular damage, renal failure, MI, stroke, death, and lead dislodgement . The patient's history has been reviewed, patient examined, no change in status, stable for surgery.  I have reviewed the patient's chart and labs.  Questions were answered to the patient's satisfaction.    Trevis Eden Elberta Fortis, MD 04/23/2021 7:05 AM      For questions or updates, please contact [facility] Please consult [email or web address] for contact info under        Signed, Adaia Matthies Jorja Loa, MD  04/23/2021, 7:04 AM

## 2021-04-23 NOTE — Discharge Instructions (Signed)
    Supplemental Discharge Instructions for  Pacemaker/Defibrillator Patients   Activity No heavy lifting or vigorous activity with your left/right arm for 6 to 8 weeks.  Do not raise your left/right arm above your head for one week.  Gradually raise your affected arm as drawn below.             04/28/21                    04/29/21                   04/30/21                 05/01/21 __  NO DRIVING until cleared to at your wound check visit.  WOUND CARE Keep the wound area clean and dry.  Do not get this area wet , no showers until cleared to at your wound check visit The tape/steri-strips on your wound will fall off; do not pull them off.  No bandage is needed on the site.  DO  NOT apply any creams, oils, or ointments to the wound area. If you notice any drainage or discharge from the wound, any swelling or bruising at the site, or you develop a fever > 101? F after you are discharged home, call the office at once.  Special Instructions You are still able to use cellular telephones; use the ear opposite the side where you have your pacemaker/defibrillator.  Avoid carrying your cellular phone near your device. When traveling through airports, show security personnel your identification card to avoid being screened in the metal detectors.  Ask the security personnel to use the hand wand. Avoid arc welding equipment, MRI testing (magnetic resonance imaging), TENS units (transcutaneous nerve stimulators).  Call the office for questions about other devices. Avoid electrical appliances that are in poor condition or are not properly grounded. Microwave ovens are safe to be near or to operate.  

## 2021-04-23 NOTE — Progress Notes (Signed)
PROGRESS NOTE    KRISTE IIAMS  [id number] DOB: 1936/03/24 DOA: 04/19/2021 PCP: Rosalee Kaufman, PA-C   Brief Narrative:   JOLANTA VANGORDEN is a 85 y.o. female with a history of coronary artery disease with history of MI, COPD, HFpEF, GERD, hypothyroidism, hypertension.  Patient seen for leg swelling due to chronic leg ulcerations on her left lower leg.  In the process of evaluation, she was noted to have a heart rate in the 30-40 bpm range and had received atropine in the ED.  She does take metoprolol on a regular basis which has been withheld.  She is on doxycycline for her cellulitis.  Blood pressure remained stable and she is asymptomatic.  Noted to have right-sided facial droop and speech slurring which has prompted CVA evaluation overnight on 11/6.  Her brain MRI has returned negative and she currently remains on aspirin and statin.  Neurology recommending CTA head which is pending.  Okay for transfer to telemetry to [facility] for permanent pacemaker placement.  Cardiology will notify EP.  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  Right-sided facial droop and speech slurring: Does not have those symptoms anymore however CT head, MRI brain, CT angiogram of head and neck are all negative. -LDL 56 and hemoglobin A1c 5.3% -Bilateral carotid ultrasound with moderate atherosclerosis and will require outpatient referral to vascular surgery for further evaluation -2D echocardiogram on 11/7 with LVEF 60-65% with moderate mitral valve regurgitation noted  Hypertension-slightly elevated, continue  lisinopril, hydralazine increased to 25 mg 3 times daily by cardiology.   Left lower extremity wound with cellulitis: There is no appreciable erythema around her chronic left lower extremity/shin wound.  Per patient, erythema has completely resolved.  We will continue doxycycline for 5 more days.  Doppler negative for DVT.  CAD with HFpEF: Stable and euvolemic. -Continue statin and aspirin -Holding beta-blocker for bradycardia   COPD-asymptomatic -Continue home inhalers   Hypothyroidism: Continue Synthroid. - DVT prophylaxis: Lovenox Code Status: Full Family Communication: Discussed with patient and her husband who was at the bedside. Disposition Plan:  Status is: Inpatient   Remains inpatient appropriate because: Continues to require ongoing telemetry monitoring and IV medications and monitoring.     Consultants:  Cardiology Neurology   Procedures:  As above   Antimicrobials:  Anti-infectives (From admission, onward)    Start     Dose/Rate Route Frequency Ordered Stop   04/23/21 1300  ceFAZolin (ANCEF) IVPB 1 g/50 mL premix        1 g 100 mL/hr over 30 Minutes Intravenous Every 6 hours 04/23/21 0843 04/24/21 0659   04/23/21 0600  gentamicin (GARAMYCIN) 80 mg in sodium chloride 0.9 % 500 mL irrigation        80 mg Irrigation On call 04/22/21 1646 04/23/21 0802   04/23/21 0530  ceFAZolin (ANCEF) IVPB 2g/100 mL premix        2 g 200 mL/hr over 30 Minutes Intravenous On call 04/22/21 1646 04/23/21 0757   04/20/21 1000  doxycycline (VIBRA-TABS) tablet 100 mg        100 mg Oral Every 12 hours 04/19/21  2350     04/19/21 1900  doxycycline (VIBRA-TABS) tablet 100 mg        100 mg Oral  Once 04/19/21 1853 04/19/21 1936       Subjective: Patient seen and examined after she returned from her procedure.  Husband the bedside.  Patient is fully alert and oriented.  She has no complaints.  Objective: Vitals:   04/23/21 1139 04/23/21 1139 04/23/21 1145 04/23/21 1200  BP: (!)  153/48 (!) 153/48 (!) 145/51 (!) 150/53  Pulse: 77 77 75 74  Resp:      Temp:      TempSrc:      SpO2:      Weight:      Height:        Intake/Output Summary (Last 24 hours) at 04/23/2021 1348 Last data filed at 04/23/2021 1345 Gross per 24 hour  Intake 597 ml  Output --  Net 597 ml    Filed Weights   04/20/21 0500 04/22/21 1520 04/23/21 0355  Weight: 46.5 kg 49.2 kg 46.5 kg    Examination:  General exam: Appears calm and comfortable  Respiratory system: Clear to auscultation. Respiratory effort normal. Cardiovascular system: S1 & S2 heard, RRR. No JVD, murmurs, rubs, gallops or clicks. No pedal edema. Gastrointestinal system: Abdomen is nondistended, soft and nontender. No organomegaly or masses felt. Normal bowel sounds heard. Central nervous system: Alert and oriented. No focal neurological deficits. Extremities: Symmetric 5 x 5 power. Skin: Chronic left anterior shin wound with no signs of cellulitis/erythema, warmth or tenderness   Data Reviewed: I have personally reviewed following labs and imaging studies  CBC: Recent Labs  Lab 04/19/21 1648 04/20/21 0141 04/21/21 0459 04/22/21 0403 04/23/21 0345  WBC 7.0 8.9 8.5 7.1 8.3  NEUTROABS 5.4  --   --   --   --   HGB 11.5* 11.3* 11.5* 12.9 13.2  HCT 34.8* 35.7* 37.9 42.3 40.8  MCV 95.1 94.9 99.2 100.0 94.2  PLT 177 197 188 180 183    Basic Metabolic Panel: Recent Labs  Lab 04/19/21 1648 04/20/21 0141 04/21/21 0459 04/22/21 0403 04/23/21 0345  NA 143 143 140 141 140  K 3.4* 3.3* 4.2 4.2 4.5  CL 110 112* 111 112* 111  CO2 26 22 22 25 22   GLUCOSE 106* 118* 95 88 88  BUN 31* 29* 35* 34* 24*  CREATININE 0.77 0.69 0.99 0.67 0.68  CALCIUM 8.7* 8.7* 8.1* 8.0* 8.0*  MG  --   --  1.6* 1.7 2.0    GFR: Estimated Creatinine Clearance: 39.1 mL/min (by C-G formula based on SCr of 0.68 mg/dL). Liver Function Tests: No results for input(s): AST, ALT, ALKPHOS, BILITOT, PROT, ALBUMIN in the last 168 hours. No  results for input(s): LIPASE, AMYLASE in the last 168 hours. No results for input(s): AMMONIA in the last 168 hours. Coagulation Profile: No results for input(s): INR, PROTIME in the last 168 hours. Cardiac Enzymes: No results for input(s): CKTOTAL, CKMB, CKMBINDEX, TROPONINI in the last 168 hours. BNP (last 3 results) No results for input(s): PROBNP in the last 8760 hours. HbA1C: Recent Labs    04/21/21 0459  HGBA1C 5.3    CBG: No results for input(s): GLUCAP in the last 168 hours. Lipid Profile: Recent Labs    04/21/21 0459  CHOL 137  HDL 63  LDLCALC 56  TRIG 90  CHOLHDL 2.2    Thyroid Function Tests: No results for input(s): TSH, T4TOTAL, FREET4, T3FREE, THYROIDAB in the last 72 hours.  Anemia Panel: No results for input(s): VITAMINB12, FOLATE, FERRITIN, TIBC, IRON, RETICCTPCT in the last 72 hours. Sepsis Labs: No results for input(s): PROCALCITON, LATICACIDVEN in the last 168 hours.  Recent Results (from the past 240 hour(s))  Resp Panel by RT-PCR (Flu A&B, Covid) Nasopharyngeal Swab     Status: None   Collection Time: 04/19/21  6:37 PM   Specimen: Nasopharyngeal Swab; Nasopharyngeal(NP) swabs in vial transport medium  Result Value Ref Range Status   SARS Coronavirus 2 by RT PCR NEGATIVE NEGATIVE Final    Comment: (NOTE) SARS-CoV-2 target nucleic acids are NOT DETECTED.  The SARS-CoV-2 RNA is generally detectable in upper respiratory specimens during the acute phase of infection. The lowest concentration of SARS-CoV-2 viral copies this assay can detect is 138 copies/mL. A negative result does not preclude SARS-Cov-2 infection and should not be used as the sole basis for treatment or other patient management decisions. A negative result may occur with  improper specimen collection/handling, submission of specimen other than nasopharyngeal swab, presence of viral mutation(s) within the areas targeted by this assay, and inadequate number of viral copies(<138  copies/mL). A negative result must be combined with clinical observations, patient history, and epidemiological information. The expected result is Negative.  Fact Sheet for Patients:  EntrepreneurPulse.com.au  Fact Sheet for Healthcare Providers:  IncredibleEmployment.be  This test is no t yet approved or cleared by the [place] FDA and  has been authorized for detection and/or diagnosis of SARS-CoV-2 by FDA under an Emergency Use Authorization (EUA). This EUA will remain  in effect (meaning this test can be used) for the duration of the COVID-19 declaration under Section 564(b)(1) of the Act, 21 U.S.C.section 360bbb-3(b)(1), unless the authorization is terminated  or revoked sooner.       Influenza A by PCR NEGATIVE NEGATIVE Final   Influenza B by PCR NEGATIVE NEGATIVE Final    Comment: (NOTE) The Xpert Xpress SARS-CoV-2/FLU/RSV plus assay is intended as an aid in the diagnosis of influenza from Nasopharyngeal swab specimens and should not be used as a sole basis for treatment. Nasal washings and aspirates are unacceptable for Xpert Xpress SARS-CoV-2/FLU/RSV testing.  Fact Sheet for Patients: EntrepreneurPulse.com.au  Fact Sheet for Healthcare Providers: IncredibleEmployment.be  This test is not yet approved or cleared by the [place] FDA and has been authorized for detection and/or diagnosis of SARS-CoV-2 by FDA under an Emergency Use Authorization (EUA). This EUA will remain in effect (meaning this test can be used) for the duration of the COVID-19 declaration under Section 564(b)(1) of the Act, 21 U.S.C. section 360bbb-3(b)(1), unless the authorization is terminated or revoked.  Performed at [facility], [address] [place], [place] [address]   MRSA Next Gen by PCR, Nasal     Status: None   Collection Time: 04/20/21 12:44 AM   Specimen: Nasal Mucosa; Nasal Swab  Result Value Ref  Range Status   MRSA by PCR Next Gen NOT DETECTED NOT DETECTED Final    Comment: (NOTE) The GeneXpert MRSA Assay (FDA approved for NASAL specimens only), is one component of a comprehensive MRSA colonization surveillance program. It is not intended to diagnose MRSA infection nor to guide or monitor treatment for MRSA infections. Test performance is not FDA approved in patients less than 32 years old. Performed at [facility], [address] [place], [place] [address]   Surgical PCR screen     Status: None   Collection Time: 04/22/21  5:50 PM   Specimen: Nasal Mucosa; Nasal Swab  Result Value Ref Range Status   MRSA, PCR NEGATIVE NEGATIVE Final   Staphylococcus aureus NEGATIVE NEGATIVE Final    Comment: (NOTE) The Xpert SA Assay (FDA approved for NASAL specimens in patients 49 years of age and older), is one component of a comprehensive surveillance program. It is not intended to diagnose infection nor to guide or monitor treatment. Performed at [facility], [place] [address], [place], [place] [address]           Radiology Studies: CT ANGIO HEAD NECK W WO CM  Result Date: 04/22/2021 CLINICAL DATA:  Stroke/TIA, assess intracranial arteries Stroke/TIA, assess extracranial arteries; right facial droop and speech slurring EXAM: CT ANGIOGRAPHY HEAD AND NECK TECHNIQUE: Multidetector CT imaging of the head and neck was performed using the standard protocol during bolus administration of intravenous contrast. Multiplanar CT image reconstructions and MIPs were obtained to evaluate the vascular anatomy. Carotid stenosis measurements (when applicable) are obtained utilizing NASCET criteria, using the distal internal carotid diameter as the denominator. CONTRAST:  6mL OMNIPAQUE IOHEXOL 350 MG/ML SOLN COMPARISON:  Correlation made with recent CT and MR imaging FINDINGS: CTA NECK Motion artifact is present. Aortic arch: Calcified plaque along the included aortic arch and patent great vessel  origins. No high-grade proximal subclavian artery stenosis. Right carotid system: Patent. Calcified plaque at the ICA origin. No definite hemodynamically significant stenosis within above limitation. Left carotid system: Patent. Mild calcified plaque along the distal common carotid and proximal internal carotid. No hemodynamically significant stenosis. Vertebral arteries: Patent and codominant. Probable mild stenosis at the right vertebral origin. Skeleton: Cervical spine degenerative changes. Chronic fracture of the posterior arch of C1 on the right. Other neck: Unremarkable. Upper chest: Small to moderate left pleural effusion. Small right pleural effusion. Emphysema. Probable mild interstitial edema. Review of the MIP images confirms the above findings CTA HEAD Anterior circulation: Intracranial internal carotid arteries patent with minor calcified plaque. Anterior cerebral arteries are patent. Anterior communicating artery is present. Middle cerebral arteries are patent. Posterior circulation: Intracranial vertebral arteries are patent. Basilar artery is patent. Major cerebellar artery origins are patent. Posterior cerebral arteries are patent. Venous sinuses: Patent as allowed by contrast bolus timing. Review of the MIP images confirms the above findings IMPRESSION: No large vessel occlusion or hemodynamically significant stenosis. Left greater than right pleural effusions with probable mild interstitial edema. Emphysema. Electronically Signed   By: Macy Mis M.D.   On: 04/22/2021 14:09   MR BRAIN WO CONTRAST  Result Date: 04/22/2021 CLINICAL DATA:  Neuro deficit, acute, stroke suspected EXAM: MRI HEAD WITHOUT CONTRAST TECHNIQUE: Multiplanar, multiecho pulse sequences of the brain and surrounding structures were obtained without intravenous contrast. COMPARISON:  CT head April 20, 2021. FINDINGS: Brain: No acute infarction, hemorrhage, hydrocephalus, extra-axial collection or mass lesion. Moderate  patchy and confluent T2 hyperintensity in the supratentorial and pontine white matter, nonspecific but compatible with chronic microvascular ischemic disease. Vascular: Major arterial flow voids are maintained skull base. Skull and upper cervical spine: Normal marrow signal. Sinuses/Orbits: Clear visualized sinuses.  Unremarkable orbits. Other: No mastoid effusions. IMPRESSION: 1. No evidence of acute intracranial abnormality. 2. Moderate to advanced chronic microvascular ischemic disease. Electronically Signed   By: Margaretha Sheffield M.D.   On: 04/22/2021 08:41   EP PPM/ICD IMPLANT  Result Date: 04/23/2021 SURGEON:  Allegra Lai, MD   PREPROCEDURE DIAGNOSIS:  second degree AV block   POSTPROCEDURE DIAGNOSIS:  second degree AV block    PROCEDURES:  1. Pacemaker implantation.   INTRODUCTION:  LADON USMAN is a 85 y.o. female with a history of bradycardia who presents today for pacemaker implantation.  The patient reports intermittent episodes of dizziness over the past few months.  No reversible causes have been identified.  The patient therefore presents today for pacemaker implantation.   DESCRIPTION OF PROCEDURE:  Informed written consent was obtained, and  the patient was brought to the electrophysiology lab in a fasting state.  The patient required no sedation for the procedure today.  The patients left chest was prepped and draped in the usual sterile fashion by the EP lab staff. The skin overlying the left deltopectoral region was infiltrated with lidocaine for local analgesia.  A 4-cm incision was made over the left deltopectoral region.  A left subcutaneous pacemaker pocket was fashioned using a combination of sharp and blunt dissection. Electrocautery was required to assure hemostasis.  RA/RV Lead Placement: The left axillary vein was therefore cannulated.  Through the left axillary vein, a [employer] model Tendril MRI [id number] (serial number  [id number]) right atrial lead and an [employer]  model Tendril MRI [id number] (serial number  [id number]) right ventricular lead were advanced with fluoroscopic visualization into the right atrial appendage and right ventricular apex positions respectively.  Initial atrial lead P- waves measured 2.8 mV with impedance of 429 ohms and a threshold of 0.8 V at 0.5 msec.  Right ventricular lead R-waves measured 8.6 mV with an impedance of 651 ohms and a threshold of 1.1 V at 0.5 msec.  Both leads were secured to the pectoralis fascia using #2-0 silk over the suture sleeves. Device Placement:  The leads were then connected to an [employer] MRI  model [id number] (serial number  [id number] ) pacemaker.  The pocket was irrigated with copious gentamicin solution.  The pacemaker was then placed into the pocket.  The pocket was then closed in 3 layers with 2.0 Vicryl suture for the 3.0 Vicryl suture subcutaneous and subcuticular layers.  Steri-  Strips and a sterile dressing were then applied. EBL<59ml.  There were no early apparent complications.   CONCLUSIONS:  1. Successful implantation of a [employer]urity MRI dual-chamber pacemaker for symptomatic bradycardia  2. No early apparent complications.       Will Curt Bears, MD 04/23/2021 8:16 AM     Scheduled Meds:  aspirin  81 mg Oral BID   atorvastatin  10 mg Oral Daily   benazepril  10 mg Oral Daily   Chlorhexidine Gluconate Cloth  6 each Topical Daily   doxycycline  100 mg Oral Q12H   hydrALAZINE  25 mg Oral Q8H   levothyroxine  25 mcg Oral Q0600   lubiprostone  24 mcg Oral Q breakfast   melatonin  9 mg Oral QHS   mometasone-formoterol  2 puff Inhalation BID   naloxegol oxalate  25 mg Oral Daily   pantoprazole  40 mg Oral Daily   polyethylene glycol  17 g Oral [id number]   raloxifene  60 mg Oral Daily   sodium chloride flush  3 mL Intravenous Q12H   temazepam  30 mg Oral QHS     LOS: 2 days   Time spent: 33 minute  Darliss Cheney, MD [facility]  If 7PM-7AM, please contact  night-coverage [email or web address] 04/23/2021, 1:48 PM

## 2021-04-23 NOTE — Consult Note (Addendum)
Neurology Consultation Reason for Consult: suspected TIA Referring Physician: Dr Heath Lark   CC: suspected TIA   History is obtained from: Patient and chart review   HPI: Mckenzie [name] is a 85 y.o. female with past medical history of coronary artery disease status post LAD stent in 2003, hypertension, mild aortic stenosis, chronic diastolic congestive heart failure, hypertension, hyperlipidemia, PSVT, carotid stenosis who initially presented on 04/19/2021 for bilateral lower extremity weakness as well as trouble breathing.  In the ED, her heart rate was noted to be in the 30s with 2 is to 1 AV block, systolic blood pressure between 1 80-200s.  Cardiology was consulted and recommended possibly transfer to [facility] for primary pacemaker.   On 04/20/2021 around 9:37 PM, RN notified hospitalist about right sided facial droop and slurred speech which was new since admission.  However Per RN, daytime nurse reported that she had been like that for most of the day.  Stroke alert was not called as patient's symptoms had resolved and her speech was back to baseline. CT had without contrast was obtained on 03/20/2021 which showed chronic atrophic and ischemic changes but no acute abnormality was noted. MRI brain without contrast was also obtained on 04/21/2021 which did not show any acute abnormality. Carotid ultrasound was obtained which showed moderate (50 to 70% stenosis of proximal left and right internal carotid artery (no significant change since prior imaging from 2010) vertebral arteries were patent.  Neurology was consulted for further management.   Patient states she is feeling fine.  She does not remember the episode.   ROS: All other systems reviewed and negative except as noted in the HPI.  Past Medical History:  Diagnosis Date   Aortic valve sclerosis    Mild, echo, April, 2012   ARDS (adult respiratory distress syndrome) ([place]) 2003   with tracheostomy-peritonitis   Arthritis     Back pain    Need for surgery, Dr. Glenna Fellows   CAD (coronary artery disease)    Stent to LAD, 2003 [facility] / nuclear scan April, 201 to artifact / persistent shortness of breath / catheterization Oct 21, 2010... widely patent stent to the mid LAD, no significant obstructive disease, should be low risk    for back surgery   Carotid arterial disease ([place])    Moderate-Dr. Trula Slade following   Chronic back pain    COPD (chronic obstructive pulmonary disease) ([place])    Diastolic dysfunction    Echo, April, 2011   EF 65%   Dysrhythmia    afib   Ejection fraction    EF 65%, echo, September 15, 2010   GERD (gastroesophageal reflux disease)    Hemolytic anemia ([place])    Followed by Dr. Janae Sauce   Hypertension    Hypothyroidism    Left ventricular hypertrophy    EF 65%, echo, April, 2011   MR (mitral regurgitation)     Mild echo, 2012 ///  Mild per Echo 2009,  mild, echo, April, 2011   Myocardial infarction [facility])    Palpitations    Holter, September 16, 2010, PACs and PVCs,one 4 beat run of SVT   Pneumonia 11-2011   Preoperative clearance    Preop clearance for back surgery   Shortness of breath    March, 2012   UTI (urinary tract infection)    Chronic-on Macrodantin   Family History  Problem Relation Age of Onset   Heart failure Father    Diabetes Son     Social History:  reports that she quit smoking about 20 years ago. Her smoking use included cigarettes. She has a 20.00 pack-year smoking history. She has never used smokeless tobacco. She reports that she does not drink alcohol and does not use drugs.  Medications Prior to Admission  Medication Sig Dispense Refill Last Dose   acetaminophen (TYLENOL) 325 MG tablet Take 1-2 tablets (325-650 mg total) by mouth every 4 (four) hours as needed for mild pain.   unknown   atorvastatin (LIPITOR) 10 MG tablet Take 10 mg by mouth daily.    04/19/2021   benazepril (LOTENSIN) 5 MG tablet Take 5 mg by mouth daily.   04/19/2021   Calcium Carbonate (CALCIUM  600 PO) Take 1,200 mg by mouth daily.   04/19/2021   Cholecalciferol (VITAMIN D3) 1000 UNITS CAPS Take 1,000 Units by mouth daily. Take one tab daily   04/19/2021   conjugated estrogens (PREMARIN) vaginal cream Place 1 Applicatorful vaginally daily.   04/19/2021   diphenhydrAMINE (BENADRYL) 25 mg capsule Take 25 mg by mouth daily as needed for sleep.   04/18/2021   esomeprazole (NEXIUM) 40 MG capsule Take 40 mg by mouth daily before breakfast.   04/19/2021   estrogens, conjugated, (PREMARIN) 0.625 MG tablet Take 0.625 mg by mouth daily.   04/19/2021   HYDROcodone-acetaminophen (NORCO) 10-325 MG tablet Take 1 tablet by mouth every 4 (four) hours as needed for severe pain. 30 tablet 0 04/19/2021   hyoscyamine (LEVSIN SL) 0.125 MG SL tablet Take 0.125 mg by mouth every 6 (six) hours as needed for cramping or diarrhea or loose stools.   04/19/2021   levothyroxine (SYNTHROID) 25 MCG tablet TAKE 1 TABLET BY MOUTH DAILY AT 6 AM. (Patient taking differently: Take 25 mcg by mouth daily before breakfast.) 30 tablet 0 04/19/2021   lubiprostone (AMITIZA) 24 MCG capsule Take by mouth.   04/19/2021   Melatonin 10 MG TABS Take 1 tablet by mouth at bedtime.   04/18/2021   methocarbamol (ROBAXIN) 500 MG tablet Take 1 tablet (500 mg total) by mouth every 6 (six) hours as needed for muscle spasms. 45 tablet 0 04/19/2021   metoprolol tartrate (LOPRESSOR) 25 MG tablet TAKE (1) TABLET TWICE DAILY. 60 tablet 0 04/19/2021 at 0800   mometasone-formoterol (DULERA) 100-5 MCG/ACT AERO Inhale 2 puffs into the lungs 2 (two) times daily. 1 each 0 unknown   MOVANTIK 25 MG TABS tablet Take 25 mg by mouth daily.   04/19/2021   Multiple Vitamin (MULTIVITAMIN) tablet Take 1 tablet by mouth daily.   04/19/2021   nitroGLYCERIN (NITROSTAT) 0.4 MG SL tablet Place under the tongue.   unknown   polyethylene glycol (MIRALAX / GLYCOLAX) 17 g packet Take 17 g by mouth every morning.   unknown   raloxifene (EVISTA) 60 MG tablet Take 60 mg by mouth daily.    04/19/2021   tamsulosin (FLOMAX) 0.4 MG CAPS capsule Take 0.4 mg by mouth daily.   04/19/2021   temazepam (RESTORIL) 30 MG capsule Take 30 mg by mouth at bedtime.   unknown   traMADol (ULTRAM) 50 MG tablet 1  po q hs prn pain 30 tablet 0 04/18/2021   traZODone (DESYREL) 50 MG tablet Take 50 mg by mouth at bedtime as needed for sleep.   04/18/2021   tretinoin (RETIN-A) 0.05 % cream Apply 1 application topically at bedtime.   04/19/2021   vitamin C (ASCORBIC ACID) 500 MG tablet Take 500 mg by mouth daily.   04/19/2021   aspirin 81 MG chewable tablet  Chew 1 tablet (81 mg total) by mouth 2 (two) times daily. (Patient not taking: No sig reported) 60 tablet 0 Not Taking   iron polysaccharides (NIFEREX) 150 MG capsule Take 1 capsule (150 mg total) by mouth daily. (Patient not taking: No sig reported) 30 capsule 0 Not Taking   Melatonin 10 MG CAPS Take by mouth. (Patient not taking: No sig reported)   Not Taking   sulfamethoxazole-trimethoprim (BACTRIM DS) 800-160 MG tablet Take 1 tablet by mouth 2 (two) times daily. (Patient not taking: No sig reported)   Completed Course      Exam: Current vital signs: BP (!) 172/66   Pulse 74   Temp (!) 97.3 F (36.3 C) (Oral)   Resp 16   Ht 5\' 2"  (1.575 m)   Wt 46.5 kg   SpO2 94%   BMI 18.75 kg/m  Vital signs in last 24 hours: Temp:  [97.3 F (36.3 C)-98.6 F (37 C)] 97.3 F (36.3 C) (11/09 0845) Pulse Rate:  [40-134] 74 (11/09 1200) Resp:  [16-20] 16 (11/09 1054) BP: (145-199)/(41-78) 172/66 (11/09 1400) SpO2:  [0 %-100 %] 94 % (11/09 1054) Weight:  [46.5 kg-49.2 kg] 46.5 kg (11/09 0355)   Physical Exam  Constitutional: Appears well-developed and well-nourished.  Psych: Affect appropriate to situation Eyes: No scleral injection HENT: No OP obstrucion Head: Normocephalic.  Cardiovascular: Normal rate and regular rhythm.  Respiratory: Effort normal, non-labored breathing GI: Soft.  No distension. There is no tenderness.  Skin: Warm, multiple  petechiae on both arms Neuro: Awake, alert, oriented x3, cranial nerves II to XII grossly intact, 5/5 in upper extremities, FTN intact   I have reviewed labs in epic and the results pertinent to this consultation are: CBC:  Recent Labs  Lab 04/19/21 1648 04/20/21 0141 04/22/21 0403 04/23/21 0345  WBC 7.0   < > 7.1 8.3  NEUTROABS 5.4  --   --   --   HGB 11.5*   < > 12.9 13.2  HCT 34.8*   < > 42.3 40.8  MCV 95.1   < > 100.0 94.2  PLT 177   < > 180 183   < > = values in this interval not displayed.    Basic Metabolic Panel:  Lab Results  Component Value Date   NA 140 04/23/2021   K 4.5 04/23/2021   CO2 22 04/23/2021   GLUCOSE 88 04/23/2021   BUN 24 (H) 04/23/2021   CREATININE 0.68 04/23/2021   CALCIUM 8.0 (L) 04/23/2021   GFRNONAA >60 04/23/2021   GFRAA >60 05/09/2018   Lipid Panel:  Lab Results  Component Value Date   LDLCALC 56 04/21/2021   HgbA1c:  Lab Results  Component Value Date   HGBA1C 5.3 04/21/2021   Urine Drug Screen:     Component Value Date/Time   LABOPIA POSITIVE (A) 05/06/2018 1627   COCAINSCRNUR NONE DETECTED 05/06/2018 1627   LABBENZ POSITIVE (A) 05/06/2018 1627   AMPHETMU NONE DETECTED 05/06/2018 1627   THCU NONE DETECTED 05/06/2018 1627   LABBARB NONE DETECTED 05/06/2018 1627    Alcohol Level No results found for: ETH   I have reviewed the images obtained:  CT without contrast 04/20/2021: Chronic atrophic and ischemic changes without acute abnormality.   MRI brain without contrast 04/21/2021: No evidence of acute intracranial abnormality. Moderate to advanced chronic microvascular ischemic disease.   Carotid ultrasound 04/21/2021: 1. Moderate (50-69%) stenosis proximal right internal carotid artery secondary to heterogenous atherosclerotic plaque. 2. Moderate (50-69%) stenosis proximal left internal  carotid artery secondary to heterogenous atherosclerotic plaque. 3. Vertebral arteries are patent with normal antegrade flow. 4. Degree of  internal carotid artery stenosis is not significantly changed compared to prior imaging from 2010.  CTA head and neck 04/22/2021: No large vessel occlusion or hemodynamically significant stenosis. Left greater than right pleural effusions with probable mild interstitial edema. Emphysema.  TTE 04/21/2021: No thrombus, no wall motion abnormalities   ASSESSMENT/PLAN: 85 year old female with transient right facial droop and speech disturbance which lasted for a few hours with eventual return to baseline.  TIA -tPA not administered as patient had already 20 baseline -No thrombectomy as no large vessel occlusion -NIHSS 0 -Etiology: Likely small vessel disease - Risk factors: Hypertension, coronary artery disease -LDL 56, A1c 5.3  Recommendations -Recommend continuing aspirin 81 mg daily -Patient is only on atorvastatin 10 mg daily, recommend increasing to atorvastatin 40 mg daily for secondary stroke prevention -Goal normotension -Recommend follow-up with neurology in 6 to 8 weeks  Thank you for allowing [place] to participate in the care of this patient. If you have any further questions, please contact  me or neurohospitalist.   Zeb Com[facility] neurohospitalist

## 2021-04-23 NOTE — Interval H&P Note (Signed)
History and Physical Interval Note:  04/23/2021 7:06 AM  Mckenzie [name]  has presented today for surgery, with the diagnosis of heart block.  The various methods of treatment have been discussed with the patient and family. After consideration of risks, benefits and other options for treatment, the patient has consented to  Procedure(s): PACEMAKER IMPLANT (N/A) as a surgical intervention.  The patient's history has been reviewed, patient examined, no change in status, stable for surgery.  I have reviewed the patient's chart and labs.  Questions were answered to the patient's satisfaction.     Jalexia Lalli [employer]

## 2021-04-23 NOTE — Progress Notes (Signed)
Progress Note  Patient Name: Mckenzie [name] Date of Encounter: 04/23/2021  [facility] Cardiologist: Werner Lean, MD   Subjective   Feeling well without complaint  Inpatient Medications    Scheduled Meds:  aspirin  81 mg Oral BID   atorvastatin  10 mg Oral Daily   benazepril  10 mg Oral Daily   Chlorhexidine Gluconate Cloth  6 each Topical Daily   doxycycline  100 mg Oral Q12H   gentamicin irrigation  80 mg Irrigation On Call   hydrALAZINE  25 mg Oral Q8H   levothyroxine  25 mcg Oral Q0600   lubiprostone  24 mcg Oral Q breakfast   melatonin  9 mg Oral QHS   mometasone-formoterol  2 puff Inhalation BID   naloxegol oxalate  25 mg Oral Daily   pantoprazole  40 mg Oral Daily   polyethylene glycol  17 g Oral BH-q7a   raloxifene  60 mg Oral Daily   sodium chloride flush  3 mL Intravenous Q12H   temazepam  30 mg Oral QHS   Continuous Infusions:  sodium chloride 50 mL/hr at 04/23/21 0016   sodium chloride 250 mL (04/23/21 0558)    ceFAZolin (ANCEF) IV     PRN Meds: docusate sodium, hydrALAZINE, HYDROcodone-acetaminophen, hyoscyamine, methocarbamol, ondansetron (ZOFRAN) IV, polyethylene glycol, sodium chloride flush, traMADol, traZODone   Vital Signs    Vitals:   04/22/21 1520 04/22/21 2003 04/22/21 2348 04/23/21 0355  BP: (!) 199/51 (!) 191/47 (!) 175/41 (!) 185/60  Pulse: (!) 40 (!) 45 (!) 43 (!) 41  Resp: 18 20 20 20   Temp: 98 F (36.7 C) 97.7 F (36.5 C) 98.6 F (37 C) 97.7 F (36.5 C)  TempSrc: Oral Oral Oral   SpO2: 100% 100% 97% 100%  Weight: 49.2 kg   46.5 kg  Height: 5\' 2"  (1.575 m)       Intake/Output Summary (Last 24 hours) at 04/23/2021 0704 Last data filed at 04/22/2021 1530 Gross per 24 hour  Intake 880 ml  Output 325 ml  Net 555 ml   Last 3 Weights 04/23/2021 04/22/2021 04/20/2021  Weight (lbs) 102 lb 8.2 oz 108 lb 7.5 oz 102 lb 8.2 oz  Weight (kg) 46.5 kg 49.2 kg 46.5 kg      Telemetry    2:1 AV block - Personally  Reviewed  ECG    2:1 AV block - Personally Reviewed  Physical Exam   GEN: No acute distress.   Neck: No JVD Cardiac: RRR, no murmurs, rubs, or gallops.  Respiratory: Clear to auscultation bilaterally. GI: Soft, nontender, non-distended  MS: No edema; No deformity. Neuro:  Nonfocal  Psych: Normal affect   Labs    High Sensitivity Troponin:  No results for input(s): TROPONINIHS in the last 720 hours.   Chemistry Recent Labs  Lab 04/21/21 0459 04/22/21 0403 04/23/21 0345  NA 140 141 140  K 4.2 4.2 4.5  CL 111 112* 111  CO2 22 25 22   GLUCOSE 95 88 88  BUN 35* 34* 24*  CREATININE 0.99 0.67 0.68  CALCIUM 8.1* 8.0* 8.0*  MG 1.6* 1.7 2.0  GFRNONAA 57* >60 >60  ANIONGAP 7 4* 7    Lipids  Recent Labs  Lab 04/21/21 0459  CHOL 137  TRIG 90  HDL 63  LDLCALC 56  CHOLHDL 2.2    Hematology Recent Labs  Lab 04/21/21 0459 04/22/21 0403 04/23/21 0345  WBC 8.5 7.1 8.3  RBC 3.82* 4.23 4.33  HGB 11.5* 12.9 13.2  HCT 37.9 42.3 40.8  MCV 99.2 100.0 94.2  MCH 30.1 30.5 30.5  MCHC 30.3 30.5 32.4  RDW 13.7 13.5 13.6  PLT 188 180 183   Thyroid  Recent Labs  Lab 04/20/21 0716  TSH 2.440    BNP Recent Labs  Lab 04/19/21 1648  BNP 863.0*    DDimer No results for input(s): DDIMER in the last 168 hours.   Radiology    CT ANGIO HEAD NECK W WO CM  Result Date: 04/22/2021 CLINICAL DATA:  Stroke/TIA, assess intracranial arteries Stroke/TIA, assess extracranial arteries; right facial droop and speech slurring EXAM: CT ANGIOGRAPHY HEAD AND NECK TECHNIQUE: Multidetector CT imaging of the head and neck was performed using the standard protocol during bolus administration of intravenous contrast. Multiplanar CT image reconstructions and MIPs were obtained to evaluate the vascular anatomy. Carotid stenosis measurements (when applicable) are obtained utilizing NASCET criteria, using the distal internal carotid diameter as the denominator. CONTRAST:  80mL OMNIPAQUE IOHEXOL 350  MG/ML SOLN COMPARISON:  Correlation made with recent CT and MR imaging FINDINGS: CTA NECK Motion artifact is present. Aortic arch: Calcified plaque along the included aortic arch and patent great vessel origins. No high-grade proximal subclavian artery stenosis. Right carotid system: Patent. Calcified plaque at the ICA origin. No definite hemodynamically significant stenosis within above limitation. Left carotid system: Patent. Mild calcified plaque along the distal common carotid and proximal internal carotid. No hemodynamically significant stenosis. Vertebral arteries: Patent and codominant. Probable mild stenosis at the right vertebral origin. Skeleton: Cervical spine degenerative changes. Chronic fracture of the posterior arch of C1 on the right. Other neck: Unremarkable. Upper chest: Small to moderate left pleural effusion. Small right pleural effusion. Emphysema. Probable mild interstitial edema. Review of the MIP images confirms the above findings CTA HEAD Anterior circulation: Intracranial internal carotid arteries patent with minor calcified plaque. Anterior cerebral arteries are patent. Anterior communicating artery is present. Middle cerebral arteries are patent. Posterior circulation: Intracranial vertebral arteries are patent. Basilar artery is patent. Major cerebellar artery origins are patent. Posterior cerebral arteries are patent. Venous sinuses: Patent as allowed by contrast bolus timing. Review of the MIP images confirms the above findings IMPRESSION: No large vessel occlusion or hemodynamically significant stenosis. Left greater than right pleural effusions with probable mild interstitial edema. Emphysema. Electronically Signed   By: Macy Mis M.D.   On: 04/22/2021 14:09   MR BRAIN WO CONTRAST  Result Date: 04/22/2021 CLINICAL DATA:  Neuro deficit, acute, stroke suspected EXAM: MRI HEAD WITHOUT CONTRAST TECHNIQUE: Multiplanar, multiecho pulse sequences of the brain and surrounding  structures were obtained without intravenous contrast. COMPARISON:  CT head April 20, 2021. FINDINGS: Brain: No acute infarction, hemorrhage, hydrocephalus, extra-axial collection or mass lesion. Moderate patchy and confluent T2 hyperintensity in the supratentorial and pontine white matter, nonspecific but compatible with chronic microvascular ischemic disease. Vascular: Major arterial flow voids are maintained skull base. Skull and upper cervical spine: Normal marrow signal. Sinuses/Orbits: Clear visualized sinuses.  Unremarkable orbits. Other: No mastoid effusions. IMPRESSION: 1. No evidence of acute intracranial abnormality. 2. Moderate to advanced chronic microvascular ischemic disease. Electronically Signed   By: Margaretha Sheffield M.D.   On: 04/22/2021 08:41   US Carotid Bilateral  Result Date: 04/21/2021 CLINICAL DATA:  85 year old female with altered mental status, right-sided facial droop EXAM: BILATERAL CAROTID DUPLEX ULTRASOUND TECHNIQUE: Pearline Cables scale imaging, color Doppler and duplex ultrasound were performed of bilateral carotid and vertebral arteries in the neck. COMPARISON:  Report from remote carotid duplex ultrasound dated  07/26/2008; 09/16/2011 FINDINGS: Criteria: Quantification of carotid stenosis is based on velocity parameters that correlate the residual internal carotid diameter with NASCET-based stenosis levels, using the diameter of the distal internal carotid lumen as the denominator for stenosis measurement. The following velocity measurements were obtained: RIGHT ICA: 154/32 cm/sec CCA: [id number] cm/sec SYSTOLIC ICA/CCA RATIO:  1.7 ECA:  101 cm/sec LEFT ICA: 210/38 cm/sec CCA: [id number] cm/sec SYSTOLIC ICA/CCA RATIO:  2.4 ECA:  99 cm/sec RIGHT CAROTID ARTERY: Heterogeneous atherosclerotic plaque in the proximal internal carotid artery. By peak systolic velocity criteria, the estimated stenosis is in the 50-69% diameter range. RIGHT VERTEBRAL ARTERY:  Patent with normal antegrade flow. LEFT  CAROTID ARTERY: Heterogeneous atherosclerotic plaque in the proximal internal carotid artery. By peak systolic velocity criteria, the estimated stenosis falls in the 50-69% diameter range. LEFT VERTEBRAL ARTERY:  Patent with normal antegrade flow. IMPRESSION: 1. Moderate (50-69%) stenosis proximal right internal carotid artery secondary to heterogenous atherosclerotic plaque. 2. Moderate (50-69%) stenosis proximal left internal carotid artery secondary to heterogenous atherosclerotic plaque. 3. Vertebral arteries are patent with normal antegrade flow. 4. Degree of internal carotid artery stenosis is not significantly changed compared to prior imaging from 2010. Electronically Signed   By: Jacqulynn Cadet M.D.   On: 04/21/2021 10:46   US Venous Img Lower Bilateral (DVT)  Result Date: 04/21/2021 CLINICAL DATA:  Right calf swelling. Left leg pain. Left hip fracture in April 2022. EXAM: BILATERAL LOWER EXTREMITY VENOUS DOPPLER ULTRASOUND TECHNIQUE: Gray-scale sonography with compression, as well as color and duplex ultrasound, were performed to evaluate the deep venous system(s) from the level of the common femoral vein through the popliteal and proximal calf veins. COMPARISON:  None. FINDINGS: VENOUS Normal compressibility of the common femoral, superficial femoral, and popliteal veins, as well as the visualized calf veins. Visualized portions of profunda femoral vein and great saphenous vein unremarkable. No filling defects to suggest DVT on grayscale or color Doppler imaging. Doppler waveforms show normal direction of venous flow, normal respiratory plasticity and response to augmentation. Limited views of the contralateral common femoral vein are unremarkable. OTHER Right calf subcutaneous edema. Limitations: none IMPRESSION: Negative. Electronically Signed   By: Claudie Revering M.D.   On: 04/21/2021 10:45   ECHOCARDIOGRAM COMPLETE  Result Date: 04/21/2021    ECHOCARDIOGRAM REPORT   Patient Name:   Mckenzie [name] Date of Exam: 04/21/2021 Medical Rec #:  [id number]         Height:       62.0 in Accession #:    [id number]        Weight:       102.5 lb Date of Birth:  09-Jul-1935         BSA:          1.439 m Patient Age:    13 years          BP:           172/31 mmHg Patient Gender: F                 HR:           35 bpm. Exam Location:  [facility] Procedure: 2D Echo, Cardiac Doppler and Color Doppler Indications:    Stroke  History:        Patient has prior history of Echocardiogram examinations, most                 recent 09/27/2020. CAD, COPD, Signs/Symptoms:Shortness of Breath;  Risk Factors:Hypertension and Dyslipidemia. LAD Stent 2003.  Sonographer:    Wenda Low Referring Phys: [id number] OLADAPO ADEFESO IMPRESSIONS  1. Left ventricular ejection fraction, by estimation, is 60 to 65%. The left ventricle has normal function. The left ventricle has no regional wall motion abnormalities. Left ventricular diastolic parameters are indeterminate.  2. Right ventricular systolic function is normal. The right ventricular size is normal. Mildly increased right ventricular wall thickness. There is mildly elevated pulmonary artery systolic pressure. The estimated right ventricular systolic pressure is [id number] mmHg.  3. The mitral valve is degenerative. Moderate mitral valve regurgitation. The mean mitral valve gradient is 6.0 mmHg with average heart rate of 38 bpm. No pulmonary vein assessment. Diastolic mitral regurgitation seen in the setting of heart block.  4. Left atrial size was severely dilated.  5. The pericardial effusion is posterior and lateral to the left ventricle.  6. The aortic valve is tricuspid. There is mild calcification of the aortic valve. There is mild thickening of the aortic valve. Aortic valve regurgitation is not visualized. Mild aortic valve stenosis. Aortic valve mean gradient measures 18.0 mmHg. Aortic valve Vmax measures 2.91 m/s. DVI 0.87 with normal LV stroke volume index.  7.  Diastolic tricuspid regurgitation noted in the setting of heart block.  8. The inferior vena cava is normal in size with greater than 50% respiratory variability, suggesting right atrial pressure of 3 mmHg. Comparison(s): A prior study was performed on 09/27/20. Prior images reviewed side by side. Mitral regurgitation has increased; similar aortic valve parameters, imaged in sinus tachycardia in last study. FINDINGS  Left Ventricle: Left ventricular ejection fraction, by estimation, is 60 to 65%. The left ventricle has normal function. The left ventricle has no regional wall motion abnormalities. The left ventricular internal cavity size was normal in size. There is  no left ventricular hypertrophy. Left ventricular diastolic parameters are indeterminate. Right Ventricle: The right ventricular size is normal. Mildly increased right ventricular wall thickness. Right ventricular systolic function is normal. There is mildly elevated pulmonary artery systolic pressure. The tricuspid regurgitant velocity is 2.86 m/s, and with an assumed right atrial pressure of 8 mmHg, the estimated right ventricular systolic pressure is [id number] mmHg. Left Atrium: Left atrial size was severely dilated. Right Atrium: Right atrial size was normal in size. Pericardium: Trivial pericardial effusion is present. The pericardial effusion is posterior and lateral to the left ventricle. Mitral Valve: The mitral valve is degenerative in appearance. Moderate mitral valve regurgitation. MV peak gradient, 17.8 mmHg. The mean mitral valve gradient is 6.0 mmHg with average heart rate of 38 bpm. Tricuspid Valve: The tricuspid valve is normal in structure. Tricuspid valve regurgitation is mild . No evidence of tricuspid stenosis. Aortic Valve: The aortic valve is tricuspid. There is mild calcification of the aortic valve. There is mild thickening of the aortic valve. Aortic valve regurgitation is not visualized. Mild aortic stenosis is present. Aortic valve  mean gradient measures  18.0 mmHg. Aortic valve peak gradient measures 33.9 mmHg. Aortic valve area, by VTI measures 1.86 cm. Pulmonic Valve: The pulmonic valve was normal in structure. Pulmonic valve regurgitation is not visualized. No evidence of pulmonic stenosis. Aorta: The aortic root is normal in size and structure. Venous: The inferior vena cava is normal in size with greater than 50% respiratory variability, suggesting right atrial pressure of 3 mmHg. IAS/Shunts: The atrial septum is grossly normal.  LEFT VENTRICLE PLAX 2D LVIDd:         4.40 cm   Diastology LVIDs:  2.90 cm   LV e' medial:   3.94 cm/s LV PW:         0.70 cm   LV E/e' medial: 36.5 LV IVS:        0.90 cm LVOT diam:     1.65 cm LV SV:         136 LV SV Index:   95 LVOT Area:     2.14 cm  RIGHT VENTRICLE RV Basal diam:  3.70 cm RV Mid diam:    2.60 cm RV S prime:     13.40 cm/s TAPSE (M-mode): 2.6 cm LEFT ATRIUM             Index        RIGHT ATRIUM           Index LA diam:        3.70 cm 2.57 cm/m   RA Area:     11.00 cm LA Vol (A2C):   74.2 ml 51.56 ml/m  RA Volume:   24.80 ml  17.23 ml/m LA Vol (A4C):   61.3 ml 42.60 ml/m LA Biplane Vol: 67.8 ml 47.12 ml/m  AORTIC VALVE                     PULMONIC VALVE AV Area (Vmax):    1.71 cm      PV Vmax:       1.46 m/s AV Area (Vmean):   1.73 cm      PV Peak grad:  8.5 mmHg AV Area (VTI):     1.86 cm AV Vmax:           291.00 cm/s AV Vmean:          180.500 cm/s AV VTI:            0.735 m AV Peak Grad:      33.9 mmHg AV Mean Grad:      18.0 mmHg LVOT Vmax:         233.00 cm/s LVOT Vmean:        146.000 cm/s LVOT VTI:          0.638 m LVOT/AV VTI ratio: 0.87  AORTA Ao Asc diam: 2.90 cm MITRAL VALVE                TRICUSPID VALVE MV Area (PHT): 2.37 cm     TR Peak grad:   32.7 mmHg MV Area VTI:   2.58 cm     TR Vmax:        286.00 cm/s MV Peak grad:  17.8 mmHg MV Mean grad:  6.0 mmHg     SHUNTS MV Vmax:       2.11 m/s     Systemic VTI:  0.64 m MV Vmean:      112.0 cm/s   Systemic  Diam: 1.65 cm MV Decel Time: 320 msec MV E velocity: 144.00 cm/s MV A velocity: 154.00 cm/s MV E/A ratio:  0.94 Rudean Haskell MD Electronically signed by Rudean Haskell MD Signature Date/Time: 04/21/2021/11:02:38 AM    Final     Cardiac Studies   TTE 04/21/21  1. Left ventricular ejection fraction, by estimation, is 60 to 65%. The  left ventricle has normal function. The left ventricle has no regional  wall motion abnormalities. Left ventricular diastolic parameters are  indeterminate.   2. Right ventricular systolic function is normal. The right ventricular  size is normal. Mildly increased right ventricular wall thickness. There  is mildly elevated pulmonary  artery systolic pressure. The estimated right  ventricular systolic pressure is  40.7 mmHg.   3. The mitral valve is degenerative. Moderate mitral valve regurgitation.  The mean mitral valve gradient is 6.0 mmHg with average heart rate of 38  bpm. No pulmonary vein assessment. Diastolic mitral regurgitation seen in  the setting of heart block.   4. Left atrial size was severely dilated.   5. The pericardial effusion is posterior and lateral to the left  ventricle.   6. The aortic valve is tricuspid. There is mild calcification of the  aortic valve. There is mild thickening of the aortic valve. Aortic valve  regurgitation is not visualized. Mild aortic valve stenosis. Aortic valve  mean gradient measures 18.0 mmHg.  Aortic valve Vmax measures 2.91 m/s. DVI 0.87 with normal LV stroke volume  index.   7. Diastolic tricuspid regurgitation noted in the setting of heart block.   8. The inferior vena cava is normal in size with greater than 50%  respiratory variability, suggesting right atrial pressure of 3 mmHg.   Patient Profile     85 y.o. female with second degree heart block  Assessment & Plan    Second degree AV block: no reversible causes. Plan for pacemaker implant Mckenzie [name] has presented today for  surgery, with the diagnosis of second degree AV block.  The various methods of treatment have been discussed with the patient and family. After consideration of risks, benefits and other options for treatment, the patient has consented to  Procedure(s): Pacemaker implant as a surgical intervention .  Risks include but not limited to bleeding, infection, pneumothorax, perforation, tamponade, vascular damage, renal failure, MI, stroke, death, and lead dislodgement . The patient's history has been reviewed, patient examined, no change in status, stable for surgery.  I have reviewed the patient's chart and labs.  Questions were answered to the patient's satisfaction.    Armaan Pond Elberta Fortis, MD 04/23/2021 7:05 AM      For questions or updates, please contact [facility] Please consult [email or web address] for contact info under        Signed, Yamir Carignan Jorja Loa, MD  04/23/2021, 7:04 AM

## 2021-04-24 ENCOUNTER — Inpatient Hospital Stay (HOSPITAL_COMMUNITY): Payer: Medicare Other

## 2021-04-24 DIAGNOSIS — L03116 Cellulitis of left lower limb: Secondary | ICD-10-CM

## 2021-04-24 LAB — BASIC METABOLIC PANEL
Anion gap: 5 (ref 5–15)
BUN: 26 mg/dL — ABNORMAL HIGH (ref 8–23)
CO2: 24 mmol/L (ref 22–32)
Calcium: 7.8 mg/dL — ABNORMAL LOW (ref 8.9–10.3)
Chloride: 110 mmol/L (ref 98–111)
Creatinine, Ser: 0.64 mg/dL (ref 0.44–1.00)
GFR, Estimated: >60 mL/min (ref >60)
Glucose, Bld: 83 mg/dL (ref 70–99)
Potassium: 4.1 mmol/L (ref 3.5–5.1)
Sodium: 139 mmol/L (ref 135–145)

## 2021-04-24 MED ORDER — FUROSEMIDE 10 MG/ML IJ SOLN: 20.0000 mg | Freq: Once | INTRAMUSCULAR | Status: DC

## 2021-04-24 MED ORDER — METOPROLOL TARTRATE 25 MG PO TABS
25.0000 mg | ORAL_TABLET | Freq: Two times a day (BID) | ORAL | Status: DC
  Administered 2021-04-24: 25 mg via ORAL
  Filled 2021-04-24: qty 1

## 2021-04-24 MED ORDER — ATORVASTATIN CALCIUM 40 MG PO TABS: 40.0000 mg | ORAL_TABLET | Freq: Every day | ORAL | 0 refills | Status: DC

## 2021-04-24 MED ORDER — BENAZEPRIL HCL 20 MG PO TABS: 20.0000 mg | ORAL_TABLET | Freq: Every day | ORAL | 0 refills | Status: AC

## 2021-04-24 MED ORDER — FUROSEMIDE 10 MG/ML IJ SOLN
20.0000 mg | Freq: Once | INTRAMUSCULAR | Status: AC
  Administered 2021-04-24: 20 mg via INTRAVENOUS
  Filled 2021-04-24: qty 2

## 2021-04-24 MED ORDER — HYDRALAZINE HCL 25 MG PO TABS: 25.0000 mg | ORAL_TABLET | Freq: Three times a day (TID) | ORAL | 0 refills | Status: DC

## 2021-04-24 MED ORDER — DOXYCYCLINE HYCLATE 100 MG PO TABS
100.0000 mg | ORAL_TABLET | Freq: Two times a day (BID) | ORAL | 0 refills | Status: AC
Start: 2021-04-24 — End: 2021-04-28

## 2021-04-24 NOTE — Evaluation (Signed)
Occupational Therapy Evaluation Patient Details Name: Mckenzie [name] MRN: [id number] DOB: 05-17-1936 Today's Date: 04/24/2021   History of Present Illness 85 y.o. female  with a hx of CAD  (s/p stent LAD at [facility] 2003, patent on cath 2012), HTN, HLD, chronic CHF (diastolic), COPD, SVT admitted it [facility] initially found with 2:1 AVblock, during her stay had TIA, transferred to [facility].. Patient is s/p PPM implant yesterday and has a very small hematoma with ecchymosis, though stable.  Patient broke her L hip in April, and recently finished with [facility] therapies about 3 weeks ago.   Clinical Impression   Patient admitted for the diagnosis and procedure above.  PTA she lives with her spouse, and recently ended [facility] therapies after a hip fx in April.  Patient does need supportive and physical assist from her spouse for home management, meals, and lower body ADL.  She continues to use a RW for mobility.  Deficits impacting independence are listed below.  Currently she is close to her baseline, she is needing supervision for mobility and Min A for lower body ADL.  Patient is declining HH services, but is open to calling her PCP should she change her mind.  Recommend continued assist from her spouse.        Recommendations for follow up therapy are one component of a multi-disciplinary discharge planning process, led by the attending physician.  Recommendations may be updated based on patient status, additional functional criteria and insurance authorization.   Follow Up Recommendations  No OT follow up    Assistance Recommended at Discharge Set up Supervision/Assistance  Functional Status Assessment  Patient has had a recent decline in their functional status and demonstrates the ability to make significant improvements in function in a reasonable and predictable amount of time.  Equipment Recommendations  Tub/shower seat    Recommendations for Other Services       Precautions / Restrictions  Precautions Precautions: ICD/Pacemaker Required Braces or Orthoses: Sling Restrictions Weight Bearing Restrictions: No Other Position/Activity Restrictions: reviewed Pacemaker restritions with respect to pushing/pulling and AROM precautions.  Sling for comfort.      Mobility Bed Mobility Overal bed mobility: Modified Independent             General bed mobility comments: increased time    Transfers Overall transfer level: Needs assistance Equipment used: Rolling walker (2 wheels) Transfers: Sit to/from Stand;Bed to chair/wheelchair/BSC Sit to Stand: Supervision Stand pivot transfers: Supervision         General transfer comment: mild dizziness noted      Balance Overall balance assessment: History of Falls;Needs assistance Sitting-balance support: Feet supported;Single extremity supported Sitting balance-Leahy Scale: Good     Standing balance support: Bilateral upper extremity supported;Reliant on assistive device for balance Standing balance-Leahy Scale: Fair                             ADL either performed or assessed with clinical judgement   ADL Overall ADL's : At baseline                                       General ADL Comments: mild unsteadiness noted at [facility] level     Vision Patient Visual Report: No change from baseline       Perception Perception Perception: Not tested   Praxis Praxis Praxis: Not tested  Pertinent Vitals/Pain Pain Assessment: Faces Faces Pain Scale: Hurts little more Pain Location: pacemaker site Pain Descriptors / Indicators: Tender Pain Intervention(s): Monitored during session     Hand Dominance Right   Extremity/Trunk Assessment Upper Extremity Assessment Upper Extremity Assessment: Overall WFL for tasks assessed;LUE deficits/detail LUE Deficits / Details: restricted shoulder forward flexion LUE Sensation: WNL LUE Coordination: WNL   Lower Extremity Assessment Lower Extremity  Assessment: Defer to PT evaluation   Cervical / Trunk Assessment Cervical / Trunk Assessment: Kyphotic   Communication Communication Communication: No difficulties   Cognition Arousal/Alertness: Awake/alert Behavior During Therapy: WFL for tasks assessed/performed Overall Cognitive Status: Within Functional Limits for tasks assessed                                 General Comments: mild forgetfulness noted     General Comments       Exercises     Shoulder Instructions      Home Living Family/patient expects to be discharged to:: Private residence Living Arrangements: Spouse/significant other;Children Available Help at Discharge: Family;Available 24 hours/day Type of Home: House Home Access: Stairs to enter [employer] of Steps: 1   Home Layout: One level     Bathroom Shower/Tub: Occupational psychologist: Standard Bathroom Accessibility: Yes How Accessible: Accessible via walker Home Equipment: [place] (2 wheels);Hand held shower head;Grab bars - tub/shower          Prior Functioning/Environment Prior Level of Function : Needs assist       Physical Assist : Mobility (physical);ADLs (physical) Mobility (physical): Transfers;Gait   Mobility Comments: Husband provides supervision for outside mobility ADLs Comments: Spouse assists with lower body ADL and IADL a needed.        OT Problem List: Impaired balance (sitting and/or standing);Decreased range of motion;Decreased knowledge of precautions;Pain      OT Treatment/Interventions:      OT Goals(Current goals can be found in the care plan section) Acute Rehab OT Goals Patient Stated Goal: Return home OT Goal Formulation: With patient Time For Goal Achievement: 05/01/21 Potential to Achieve Goals: Good  OT Frequency:   None noted  Barriers to D/C:            Co-evaluation              AM-PAC OT "6 Clicks" Daily Activity     Outcome Measure Help from  another person eating meals?: None Help from another person taking care of personal grooming?: None Help from another person toileting, which includes using toliet, bedpan, or urinal?: A Little Help from another person bathing (including washing, rinsing, drying)?: A Little Help from another person to put on and taking off regular upper body clothing?: None Help from another person to put on and taking off regular lower body clothing?: A Little 6 Click Score: 21   End of Session Equipment Utilized During Treatment: Gait belt;Rolling walker (2 wheels) Nurse Communication: Mobility status  Activity Tolerance: Patient tolerated treatment well Patient left: in chair;with call bell/phone within reach  OT Visit Diagnosis: Unsteadiness on feet (R26.81);Pain                Time: [id number] OT Time Calculation (min): 20 min Charges:  OT General Charges $OT Visit: 1 Visit OT Evaluation $OT Eval Moderate Complexity: 1 Mod  04/24/2021  RP, OTR/L  Acute Rehabilitation Services  Office:  [phone]   Metta Clines 04/24/2021, 10:15 AM

## 2021-04-24 NOTE — TOC Transition Note (Addendum)
Transition of Care [facility]) - CM/SW Discharge Note   Patient Details  Name: Mckenzie [name] MRN: [id number] Date of Birth: 1935/10/06  Transition of Care [facility]) CM/SW Contact:  Lockie Pares, RN Phone Number: 04/24/2021, 1:15 PM   Clinical Narrative:    No  PT or OT recommendations for Home Health. Pateint to DC today  Final next level of care: Home/Self Care     Patient Goals and CMS Choice        Discharge Placement               Home self care        Discharge Plan and Services                DME Arranged: N/A                    Social Determinants of Health (SDOH) Interventions     Readmission Risk Interventions No flowsheet data found.

## 2021-04-24 NOTE — Evaluation (Signed)
Physical Therapy Evaluation Patient Details Name: Mckenzie [name] MRN: [id number] DOB: 09-May-1936 Today's Date: 04/24/2021  History of Present Illness  85 y.o. female  with a hx of CAD  (s/p stent LAD at [facility] 2003, patent on cath 2012), HTN, HLD, chronic CHF (diastolic), COPD, SVT admitted it [facility] initially found with 2:1 AVblock, during her stay had TIA, transferred to [facility].. Patient is s/p PPM implant yesterday and has a very small hematoma with ecchymosis, though stable.  Patient broke her L hip in April, and recently finished with [facility] therapies about 3 weeks ago.  Clinical Impression  Pt presents to PT at close to baseline with mobility and good support from family at home. Pt feels she can manage at home with current set up. Just recently finished several months of HHPT and doesn't feel she needs to resume this at this time.        Recommendations for follow up therapy are one component of a multi-disciplinary discharge planning process, led by the attending physician.  Recommendations may be updated based on patient status, additional functional criteria and insurance authorization.  Follow Up Recommendations No PT follow up (Pt just finished HHPT and declines further HHPT at this time.)    Assistance Recommended at Discharge Intermittent Supervision/Assistance  Functional Status Assessment Patient has not had a recent decline in their functional status  Equipment Recommendations  None recommended by PT    Recommendations for Other Services       Precautions / Restrictions Precautions Precautions: ICD/Pacemaker Required Braces or Orthoses: Sling Restrictions Weight Bearing Restrictions: No Other Position/Activity Restrictions: reviewed Pacemaker restritions with respect to pushing/pulling and AROM precautions.  Sling for comfort.      Mobility  Bed Mobility Overal bed mobility: Modified Independent             General bed mobility comments: Pt up in chair     Transfers Overall transfer level: Needs assistance Equipment used: Rolling walker (2 wheels) Transfers: Sit to/from Stand Sit to Stand: Supervision Stand pivot transfers: Supervision         General transfer comment: supervision for safety. No physical assist    Ambulation/Gait Ambulation/Gait assistance: Supervision Gait Distance (Feet): 125 Feet Assistive device: Rolling walker (2 wheels) Gait Pattern/deviations: Step-through pattern;Decreased step length - left;Decreased step length - right;Decreased stride length;Trunk flexed Gait velocity: decr Gait velocity interpretation: <1.31 ft/sec, indicative of household ambulator   General Gait Details: supervision for safety. No physical assist  Stairs            Wheelchair Mobility    Modified Rankin (Stroke Patients Only)       Balance Overall balance assessment: History of Falls;Needs assistance Sitting-balance support: No upper extremity supported;Feet supported Sitting balance-Leahy Scale: Good     Standing balance support: No upper extremity supported;During functional activity Standing balance-Leahy Scale: Fair Standing balance comment: Washed hands at sink unsupported                             Pertinent Vitals/Pain Pain Assessment: Faces Faces Pain Scale: Hurts little more Pain Location: lt hip Pain Descriptors / Indicators: Sore Pain Intervention(s): Limited activity within patient's tolerance    Home Living Family/patient expects to be discharged to:: Private residence Living Arrangements: Spouse/significant other;Children Available Help at Discharge: Family;Available 24 hours/day Type of Home: House Home Access: Stairs to enter   [employer] of Steps: 1   Home Layout: One level Home Equipment: Conservation officer, nature (  2 wheels);Hand held shower head;Grab bars - tub/shower      Prior Function Prior Level of Function : Needs assist       Physical Assist : Mobility  (physical);ADLs (physical) Mobility (physical): Transfers;Gait   Mobility Comments: Husband provides supervision for outside mobility ADLs Comments: Spouse assists with lower body ADL and IADL a needed.     Hand Dominance   Dominant Hand: Right    Extremity/Trunk Assessment   Upper Extremity Assessment Upper Extremity Assessment: Defer to OT evaluation LUE Deficits / Details: restricted shoulder forward flexion LUE Sensation: WNL LUE Coordination: WNL    Lower Extremity Assessment Lower Extremity Assessment: Generalized weakness    Cervical / Trunk Assessment Cervical / Trunk Assessment: Kyphotic  Communication   Communication: No difficulties  Cognition Arousal/Alertness: Awake/alert Behavior During Therapy: WFL for tasks assessed/performed Overall Cognitive Status: Within Functional Limits for tasks assessed                                 General Comments: mild forgetfulness noted        General Comments General comments (skin integrity, edema, etc.): SpO2 95% on RA after amb. HR 90's with amb    Exercises     Assessment/Plan    PT Assessment Patient does not need any further PT services  PT Problem List         PT Treatment Interventions      PT Goals (Current goals can be found in the Care Plan section)  Acute Rehab PT Goals PT Goal Formulation: All assessment and education complete, DC therapy    Frequency     Barriers to discharge        Co-evaluation               AM-PAC PT "6 Clicks" Mobility  Outcome Measure Help needed turning from your back to your side while in a flat bed without using bedrails?: None Help needed moving from lying on your back to sitting on the side of a flat bed without using bedrails?: None Help needed moving to and from a bed to a chair (including a wheelchair)?: A Little Help needed standing up from a chair using your arms (e.g., wheelchair or bedside chair)?: None Help needed to walk in  hospital room?: A Little Help needed climbing 3-5 steps with a railing? : A Little 6 Click Score: 21    End of Session   Activity Tolerance: Patient tolerated treatment well Patient left: in chair;with call bell/phone within reach;with family/visitor present   PT Visit Diagnosis: Muscle weakness (generalized) (M62.81);Other abnormalities of gait and mobility (R26.89)    Time: 7893-8101 PT Time Calculation (min) (ACUTE ONLY): 20 min   Charges:   PT Evaluation $PT Eval Moderate Complexity: 1 Mod          [facility] PT Acute Rehabilitation Services Pager [phone] Office [phone]   Angelina Ok [facility] 04/24/2021, 10:46 AM

## 2021-04-24 NOTE — Progress Notes (Addendum)
Progress Note  Patient Name: Mckenzie [name] Date of Encounter: 04/24/2021  [facility] Cardiologist: Christell Constant, MD   Subjective   Feeling well without complaint  Inpatient Medications    Scheduled Meds:  aspirin  81 mg Oral BID   atorvastatin  40 mg Oral Daily   benazepril  10 mg Oral Daily   Chlorhexidine Gluconate Cloth  6 each Topical Daily   doxycycline  100 mg Oral Q12H   hydrALAZINE  25 mg Oral Q8H   levothyroxine  25 mcg Oral Q0600   lubiprostone  24 mcg Oral Q breakfast   melatonin  9 mg Oral QHS   mometasone-formoterol  2 puff Inhalation BID   naloxegol oxalate  25 mg Oral Daily   pantoprazole  40 mg Oral Daily   polyethylene glycol  17 g Oral BH-q7a   raloxifene  60 mg Oral Daily   sodium chloride flush  3 mL Intravenous Q12H   temazepam  30 mg Oral QHS   Continuous Infusions:   PRN Meds: acetaminophen, docusate sodium, hydrALAZINE, HYDROcodone-acetaminophen, hyoscyamine, methocarbamol, ondansetron (ZOFRAN) IV, polyethylene glycol, traMADol, traZODone   Vital Signs    Vitals:   04/23/21 1614 04/23/21 1942 04/24/21 0024 04/24/21 0330  BP: (!) 160/62 (!) 162/65 (!) 131/53 (!) 159/65  Pulse:  87 74 72  Resp:  18 19 17   Temp:  97.9 F (36.6 C) 97.6 F (36.4 C) (!) 97.3 F (36.3 C)  TempSrc:  Oral Oral Oral  SpO2:  91% 93% 95%  Weight:    47.5 kg  Height:        Intake/Output Summary (Last 24 hours) at 04/24/2021 0845 Last data filed at 04/23/2021 2143 Gross per 24 hour  Intake 827 ml  Output 250 ml  Net 577 ml   Last 3 Weights 04/24/2021 04/23/2021 04/22/2021  Weight (lbs) 104 lb 11.5 oz 102 lb 8.2 oz 108 lb 7.5 oz  Weight (kg) 47.5 kg 46.5 kg 49.2 kg      Telemetry    SR/V paced- Personally Reviewed  ECG    SR/V paced - Personally Reviewed  Physical Exam   GEN: No acute distress.   Neck: No JVD Cardiac: RRR, no murmurs, rubs, or gallops.  Respiratory: CTA b/l. GI: Soft, nontender, non-distended  MS: No edema;  advanced atrophy. Neuro:  Nonfocal  Psych: Normal affect   Pacer site: very small hematoma and ecchymosis noted  Labs    High Sensitivity Troponin:  No results for input(s): TROPONINIHS in the last 720 hours.   Chemistry Recent Labs  Lab 04/21/21 0459 04/22/21 0403 04/23/21 0345 04/24/21 0350  NA 140 141 140 139  K 4.2 4.2 4.5 4.1  CL 111 112* 111 110  CO2 22 25 22 24   GLUCOSE 95 88 88 83  BUN 35* 34* 24* 26*  CREATININE 0.99 0.67 0.68 0.64  CALCIUM 8.1* 8.0* 8.0* 7.8*  MG 1.6* 1.7 2.0  --   GFRNONAA 57* >60 >60 >60  ANIONGAP 7 4* 7 5    Lipids  Recent Labs  Lab 04/21/21 0459  CHOL 137  TRIG 90  HDL 63  LDLCALC 56  CHOLHDL 2.2    Hematology Recent Labs  Lab 04/21/21 0459 04/22/21 0403 04/23/21 0345  WBC 8.5 7.1 8.3  RBC 3.82* 4.23 4.33  HGB 11.5* 12.9 13.2  HCT 37.9 42.3 40.8  MCV 99.2 100.0 94.2  MCH 30.1 30.5 30.5  MCHC 30.3 30.5 32.4  RDW 13.7 13.5 13.6  PLT  188 180 183   Thyroid  Recent Labs  Lab 04/20/21 0716  TSH 2.440    BNP Recent Labs  Lab 04/19/21 1648  BNP 863.0*    DDimer No results for input(s): DDIMER in the last 168 hours.   Radiology    CT ANGIO HEAD NECK W WO CM Result Date: 04/22/2021 CLINICAL DATA:  Stroke/TIA, assess intracranial arteries Stroke/TIA, assess extracranial arteries; right facial droop and speech slurring EXAM: CT ANGIOGRAPHY HEAD AND NECK TECHNIQUE: Multidetector CT imaging of the head and neck was performed using the standard protocol during bolus administration of intravenous contrast. Multiplanar CT image reconstructions and MIPs were obtained to evaluate the vascular anatomy. Carotid stenosis measurements (when applicable) are obtained utilizing NASCET criteria, using the distal internal carotid diameter as the denominator. CONTRAST:  66mL OMNIPAQUE IOHEXOL 350 MG/ML SOLN COMPARISON:  Correlation made with recent CT and MR imaging FINDINGS: CTA NECK Motion artifact is present. Aortic arch: Calcified plaque  along the included aortic arch and patent great vessel origins. No high-grade proximal subclavian artery stenosis. Right carotid system: Patent. Calcified plaque at the ICA origin. No definite hemodynamically significant stenosis within above limitation. Left carotid system: Patent. Mild calcified plaque along the distal common carotid and proximal internal carotid. No hemodynamically significant stenosis. Vertebral arteries: Patent and codominant. Probable mild stenosis at the right vertebral origin. Skeleton: Cervical spine degenerative changes. Chronic fracture of the posterior arch of C1 on the right. Other neck: Unremarkable. Upper chest: Small to moderate left pleural effusion. Small right pleural effusion. Emphysema. Probable mild interstitial edema. Review of the MIP images confirms the above findings CTA HEAD Anterior circulation: Intracranial internal carotid arteries patent with minor calcified plaque. Anterior cerebral arteries are patent. Anterior communicating artery is present. Middle cerebral arteries are patent. Posterior circulation: Intracranial vertebral arteries are patent. Basilar artery is patent. Major cerebellar artery origins are patent. Posterior cerebral arteries are patent. Venous sinuses: Patent as allowed by contrast bolus timing. Review of the MIP images confirms the above findings IMPRESSION: No large vessel occlusion or hemodynamically significant stenosis. Left greater than right pleural effusions with probable mild interstitial edema. Emphysema. Electronically Signed   By: Macy Mis M.D.   On: 04/22/2021 14:09    Cardiac Studies   TTE 04/21/21  1. Left ventricular ejection fraction, by estimation, is 60 to 65%. The  left ventricle has normal function. The left ventricle has no regional  wall motion abnormalities. Left ventricular diastolic parameters are  indeterminate.   2. Right ventricular systolic function is normal. The right ventricular  size is normal. Mildly  increased right ventricular wall thickness. There  is mildly elevated pulmonary artery systolic pressure. The estimated right  ventricular systolic pressure is  [id number] mmHg.   3. The mitral valve is degenerative. Moderate mitral valve regurgitation.  The mean mitral valve gradient is 6.0 mmHg with average heart rate of 38  bpm. No pulmonary vein assessment. Diastolic mitral regurgitation seen in  the setting of heart block.   4. Left atrial size was severely dilated.   5. The pericardial effusion is posterior and lateral to the left  ventricle.   6. The aortic valve is tricuspid. There is mild calcification of the  aortic valve. There is mild thickening of the aortic valve. Aortic valve  regurgitation is not visualized. Mild aortic valve stenosis. Aortic valve  mean gradient measures 18.0 mmHg.  Aortic valve Vmax measures 2.91 m/s. DVI 0.87 with normal LV stroke volume  index.   7. Diastolic  tricuspid regurgitation noted in the setting of heart block.   8. The inferior vena cava is normal in size with greater than 50%  respiratory variability, suggesting right atrial pressure of 3 mmHg.   Patient Profile     85 y.o. female  with a hx of CAD  (s/p stent LAD at [facility], patent on cath 2012), HTN, HLD, chronic CHF (diastolic), COPD, SVT admitted it [facility] initially found with 2:1 AVblock, during her stay had TIA, transferred to [facility] for further management, EP consult  Assessment & Plan    Symptomatic bradycardia Advanced heart block, mobitz II continues despite BB washout Baseline conduction system disease  Now s/p PPM implant yesterday with Dr. Curt Bears She has a very small hematoma with ecchymosis, though stable Wound care and activity restrictions were discussed with the patient by Dr. Curt Bears this AM and reinforced by myself as well Device check this AM with stable measurements CXR today without ptx Post pacer follow up is in place Darris Staiger resume her home lopressor for BP   She  has some volume on CXR, likely 2/2 days of heart block, Jerianne Anselmo give small dose of IV lasix, AV conduction should continue to help this OK to discharge from EP perspective when felt ready medically otherwise      For questions or updates, please contact [facility] Please consult [email or web address] for contact info under        Signed, Baldwin Jamaica, PA-C  04/24/2021, 8:45 AM    I have seen and examined this patient with Tommye Standard.  Agree with above, note added to reflect my findings.  On exam, RRR, no murmurs.  She is now status post [facility] pacemaker for second-degree AV block.  Device functioning appropriately.  Chest x-ray and interrogation without issue.  Okay for discharge today with follow-up in device clinic.  Oddis Westling M. Dontel Harshberger MD 04/24/2021 11:56 AM

## 2021-04-24 NOTE — Discharge Summary (Addendum)
Physician Discharge Summary  JYL REIFER [id number] DOB: [birth year] DOA: 04/19/2021  PCP: Rosalee Kaufman, PA-C  Admit date: 04/19/2021 Discharge date: 04/24/2021 30 Day Unplanned Readmission Risk Score    Flowsheet Row ED to Hosp-Admission (Current) from 04/19/2021 in [facility]  30 Day Unplanned Readmission Risk Score (%) 22.53 Filed at 04/24/2021 0801       This score is the patient's risk of an unplanned readmission within 30 days of being discharged (0 -100%). The score is based on dignosis, age, lab data, medications, orders, and past utilization.   Low:  0-14.9   Medium: 15-21.9   High: 22-29.9   Extreme: 30 and above          Admitted From: Home Disposition: Home  Recommendations for Outpatient Follow-up:  Follow up with PCP in 1-2 weeks Please obtain BMP/CBC in one week Follow-up with cardiology, please call them to schedule appointment Please follow up with your PCP on the following pending results: Unresulted Labs (From admission, onward)    None         Home Health: None Equipment/Devices: None  Discharge Condition: Stable CODE STATUS: Full code Diet recommendation: Cardiac  Subjective: Seen and examined.  She feels well.  She has no complaints.  She wants to go home.  She is cleared by cardiology.  Brief/Interim Summary: Mckenzie [name] is a 85 y.o. female with a history of coronary artery disease with history of MI, COPD, HFpEF, GERD, hypothyroidism, hypertension.  Patient seen for leg swelling due to chronic leg ulcerations on her left lower leg.  In the process of evaluation, she was noted to have a heart rate in the 30-40 bpm range and had received atropine in the ED.  She does take metoprolol on a regular basis which was held.  She was already on doxycycline for left lower extremity cellulitis/wound.  Blood pressure remained stable and she was asymptomatic.  Noted to have right-sided facial droop and speech slurring  which has prompted CVA evaluation overnight on 11/6.  Her brain MRI has returned negative and she currently remains on aspirin and statin.  Neurology recommending CTA head which was done and was negative for any large vessel obstruction either.  She was eventually transferred to [facility] in order to be seen by neurology and EP team for possible stroke and bradycardia respectively.  She eventually underwent PPM placement on 04/23/2021 and cardiology has cleared her for discharge today and they have recommended to resume her beta-blocker for hypertension.   Right-sided facial droop and speech slurring: Does not have those symptoms anymore however CT head, MRI brain, CT angiogram of head and neck are all negative. -LDL 56 and hemoglobin A1c 5.3% -Bilateral carotid ultrasound with moderate atherosclerosis and will require outpatient referral to vascular surgery for further evaluation -2D echocardiogram on 11/7 with LVEF 60-65% with moderate mitral valve regurgitation noted.  She was seen by neurology.  Symptoms resolved and all imaging are negative, likely had TIA, according to neurology.  They increased her atorvastatin from 10 mg to 40 mg and advised to continue aspirin.  Follow-up with neurology in 4 to 6 weeks.   Hypertension-patient's blood pressure remained elevated significantly in the beginning, her benazepril was increased from 5 mg to 10 mg and hydralazine 25 mg 3 times daily was added.  Blood pressure improved but still elevated, I am discharging her on current dose of hydralazine but increase the dose of benazepril to 20 mg p.o. daily.  Left lower extremity wound with cellulitis: Cellulitis has almost resolved.  Discharging on 4 more days of oral doxycycline.   CAD with HFpEF: Stable and euvolemic. -Continue statin and aspirin -Holding beta-blocker for bradycardia   COPD-asymptomatic -Continue home inhalers   Hypothyroidism: Continue Synthroid  Discharge Diagnoses:  Principal  Problem:   Bradycardia Active Problems:   GERD (gastroesophageal reflux disease)   Diastolic dysfunction   CAD (coronary artery disease)/Stent to LAD, 2003 [facility]   Hypothyroidism, postsurgical   COPD (chronic obstructive pulmonary disease) ([place])   Essential hypertension   Lower extremity ulceration (HCC)   Heart block, AV   Cellulitis of left lower extremity    Discharge Instructions   Allergies as of 04/24/2021       Reactions   Tetracycline Nausea Only        Medication List     STOP taking these medications    iron polysaccharides 150 MG capsule Commonly known as: NIFEREX   sulfamethoxazole-trimethoprim 800-160 MG tablet Commonly known as: BACTRIM DS       TAKE these medications    acetaminophen 325 MG tablet Commonly known as: TYLENOL Take 1-2 tablets (325-650 mg total) by mouth every 4 (four) hours as needed for mild pain.   aspirin 81 MG chewable tablet Chew 1 tablet (81 mg total) by mouth 2 (two) times daily.   atorvastatin 40 MG tablet Commonly known as: LIPITOR Take 1 tablet (40 mg total) by mouth daily. Start taking on: April 25, 2021 What changed:  medication strength how much to take   benazepril 20 MG tablet Commonly known as: LOTENSIN Take 1 tablet (20 mg total) by mouth daily. Start taking on: April 25, 2021 What changed:  medication strength how much to take   CALCIUM 600 PO Take 1,200 mg by mouth daily.   conjugated estrogens 0.625 MG/GM vaginal cream Commonly known as: PREMARIN Place 1 Applicatorful vaginally daily.   diphenhydrAMINE 25 mg capsule Commonly known as: BENADRYL Take 25 mg by mouth daily as needed for sleep.   doxycycline 100 MG tablet Commonly known as: VIBRA-TABS Take 1 tablet (100 mg total) by mouth every 12 (twelve) hours for 4 days.   esomeprazole 40 MG capsule Commonly known as: NEXIUM Take 40 mg by mouth daily before breakfast.   estrogens (conjugated) 0.625 MG tablet Commonly known as:  PREMARIN Take 0.625 mg by mouth daily.   hydrALAZINE 25 MG tablet Commonly known as: APRESOLINE Take 1 tablet (25 mg total) by mouth every 8 (eight) hours.   HYDROcodone-acetaminophen 10-325 MG tablet Commonly known as: NORCO Take 1 tablet by mouth every 4 (four) hours as needed for severe pain.   hyoscyamine 0.125 MG SL tablet Commonly known as: LEVSIN SL Take 0.125 mg by mouth every 6 (six) hours as needed for cramping or diarrhea or loose stools.   levothyroxine 25 MCG tablet Commonly known as: SYNTHROID TAKE 1 TABLET BY MOUTH DAILY AT 6 AM. What changed: See the new instructions.   lubiprostone 24 MCG capsule Commonly known as: AMITIZA Take by mouth.   Melatonin 10 MG Tabs Take 1 tablet by mouth at bedtime. What changed: Another medication with the same name was removed. Continue taking this medication, and follow the directions you see here.   methocarbamol 500 MG tablet Commonly known as: ROBAXIN Take 1 tablet (500 mg total) by mouth every 6 (six) hours as needed for muscle spasms.   metoprolol tartrate 25 MG tablet Commonly known as: LOPRESSOR TAKE (1) TABLET TWICE DAILY.  mometasone-formoterol 100-5 MCG/ACT Aero Commonly known as: DULERA Inhale 2 puffs into the lungs 2 (two) times daily.   Movantik 25 MG Tabs tablet Generic drug: naloxegol oxalate Take 25 mg by mouth daily.   multivitamin tablet Take 1 tablet by mouth daily.   nitroGLYCERIN 0.4 MG SL tablet Commonly known as: NITROSTAT Place under the tongue.   polyethylene glycol 17 g packet Commonly known as: MIRALAX / GLYCOLAX Take 17 g by mouth every morning.   raloxifene 60 MG tablet Commonly known as: EVISTA Take 60 mg by mouth daily.   tamsulosin 0.4 MG Caps capsule Commonly known as: FLOMAX Take 0.4 mg by mouth daily.   temazepam 30 MG capsule Commonly known as: RESTORIL Take 30 mg by mouth at bedtime.   traMADol 50 MG tablet Commonly known as: ULTRAM 1  po q hs prn pain    traZODone 50 MG tablet Commonly known as: DESYREL Take 50 mg by mouth at bedtime as needed for sleep.   tretinoin 0.05 % cream Commonly known as: RETIN-A Apply 1 application topically at bedtime.   vitamin C 500 MG tablet Commonly known as: ASCORBIC ACID Take 500 mg by mouth daily.   Vitamin D3 25 MCG (1000 UT) Caps Take 1,000 Units by mouth daily. Take one tab daily        Follow-up Information     [place] [facility] Follow up.   Specialty: Cardiology Why: 05/07/21 @ 1:20PM, wound check visit Contact information: [address], [place]        Constance Haw, MD Follow up.   Specialty: Cardiology Why: 08/12/20 @ 4;15PM Contact information: [place] [address] [phone]         Werner Lean, MD .   Specialty: Cardiology Contact information: [place] [address] [phone]         Rosalee Kaufman, PA-C Follow up.   Specialty: Physician Assistant Why: The office will call 04/25/21 to make appointment Contact information: [place] [address] [phone]         Rosalee Kaufman, PA-C Follow up in 1 week(s).   Specialty: Physician Assistant Contact information: [address] [place] [address] [phone]         Werner Lean, MD .   Specialty: Cardiology Contact information: [address] [place] [address] [phone]         [facility] Follow up in 1 month(s).   Contact information: [address]     [address] [place] [phone]               Allergies  Allergen Reactions   Tetracycline Nausea Only    Consultations: Cardiology and neurology.   Procedures/Studies: CT ANGIO HEAD NECK W WO CM  Result Date: 04/22/2021 CLINICAL DATA:  Stroke/TIA, assess intracranial  arteries Stroke/TIA, assess extracranial arteries; right facial droop and speech slurring EXAM: CT ANGIOGRAPHY HEAD AND NECK TECHNIQUE: Multidetector CT imaging of the head and neck was performed using the standard protocol during bolus administration of intravenous contrast. Multiplanar CT image reconstructions and MIPs were obtained to evaluate the vascular anatomy. Carotid stenosis measurements (when applicable) are obtained utilizing NASCET criteria, using the distal internal carotid diameter as the denominator. CONTRAST:  43mL OMNIPAQUE IOHEXOL 350 MG/ML SOLN COMPARISON:  Correlation made with recent CT and MR imaging  FINDINGS: CTA NECK Motion artifact is present. Aortic arch: Calcified plaque along the included aortic arch and patent great vessel origins. No high-grade proximal subclavian artery stenosis. Right carotid system: Patent. Calcified plaque at the ICA origin. No definite hemodynamically significant stenosis within above limitation. Left carotid system: Patent. Mild calcified plaque along the distal common carotid and proximal internal carotid. No hemodynamically significant stenosis. Vertebral arteries: Patent and codominant. Probable mild stenosis at the right vertebral origin. Skeleton: Cervical spine degenerative changes. Chronic fracture of the posterior arch of C1 on the right. Other neck: Unremarkable. Upper chest: Small to moderate left pleural effusion. Small right pleural effusion. Emphysema. Probable mild interstitial edema. Review of the MIP images confirms the above findings CTA HEAD Anterior circulation: Intracranial internal carotid arteries patent with minor calcified plaque. Anterior cerebral arteries are patent. Anterior communicating artery is present. Middle cerebral arteries are patent. Posterior circulation: Intracranial vertebral arteries are patent. Basilar artery is patent. Major cerebellar artery origins are patent. Posterior cerebral arteries are patent. Venous sinuses:  Patent as allowed by contrast bolus timing. Review of the MIP images confirms the above findings IMPRESSION: No large vessel occlusion or hemodynamically significant stenosis. Left greater than right pleural effusions with probable mild interstitial edema. Emphysema. Electronically Signed   By: Macy Mis M.D.   On: 04/22/2021 14:09   DG Chest 2 View  Result Date: 04/24/2021 CLINICAL DATA:  Placement of cardiac pacer EXAM: CHEST - 2 VIEW COMPARISON:  Previous studies including the examination of 04/19/2021 FINDINGS: Transverse diameter of heart is increased. There is interval placement of pacemaker battery in the left infraclavicular region with tips of leads in the right atrium and right ventricle. Central pulmonary vessels are more prominent. Increased interstitial markings are seen in the parahilar regions and lower lung fields with worsening. There is faint haziness in both lower lung fields, possibly due to layering of pleural effusions, more so on the left side. There is no pneumothorax. IMPRESSION: Interval placement of cardiac pacer. Central pulmonary vessels are more prominent. Increased interstitial markings in both lungs suggest pulmonary edema. Haziness in the lower lung fields suggests possible small bilateral pleural effusions. Electronically Signed   By: Elmer Picker M.D.   On: 04/24/2021 08:51   CT HEAD WO CONTRAST (5MM)  Result Date: 04/20/2021 CLINICAL DATA:  Weakness and confusion EXAM: CT HEAD WITHOUT CONTRAST TECHNIQUE: Contiguous axial images were obtained from the base of the skull through the vertex without intravenous contrast. COMPARISON:  None. FINDINGS: Brain: No evidence of acute infarction, hemorrhage, hydrocephalus, extra-axial collection or mass lesion/mass effect. Chronic atrophic and ischemic changes are identified. Vascular: No hyperdense vessel or unexpected calcification. Skull: Normal. Negative for fracture or focal lesion. Sinuses/Orbits: No acute finding.  Other: None. IMPRESSION: Chronic atrophic and ischemic changes without acute abnormality. Electronically Signed   By: Inez Catalina M.D.   On: 04/20/2021 20:59   MR BRAIN WO CONTRAST  Result Date: 04/22/2021 CLINICAL DATA:  Neuro deficit, acute, stroke suspected EXAM: MRI HEAD WITHOUT CONTRAST TECHNIQUE: Multiplanar, multiecho pulse sequences of the brain and surrounding structures were obtained without intravenous contrast. COMPARISON:  CT head April 20, 2021. FINDINGS: Brain: No acute infarction, hemorrhage, hydrocephalus, extra-axial collection or mass lesion. Moderate patchy and confluent T2 hyperintensity in the supratentorial and pontine white matter, nonspecific but compatible with chronic microvascular ischemic disease. Vascular: Major arterial flow voids are maintained skull base. Skull and upper cervical spine: Normal marrow signal. Sinuses/Orbits: Clear visualized sinuses.  Unremarkable orbits. Other: No mastoid effusions. IMPRESSION:  1. No evidence of acute intracranial abnormality. 2. Moderate to advanced chronic microvascular ischemic disease. Electronically Signed   By: Feliberto Harts M.D.   On: 04/22/2021 08:41   US Carotid Bilateral  Result Date: 04/21/2021 CLINICAL DATA:  85 year old female with altered mental status, right-sided facial droop EXAM: BILATERAL CAROTID DUPLEX ULTRASOUND TECHNIQUE: Wallace Cullens scale imaging, color Doppler and duplex ultrasound were performed of bilateral carotid and vertebral arteries in the neck. COMPARISON:  Report from remote carotid duplex ultrasound dated 07/26/2008; 09/16/2011 FINDINGS: Criteria: Quantification of carotid stenosis is based on velocity parameters that correlate the residual internal carotid diameter with NASCET-based stenosis levels, using the diameter of the distal internal carotid lumen as the denominator for stenosis measurement. The following velocity measurements were obtained: RIGHT ICA: 154/32 cm/sec CCA: 92/12 cm/sec SYSTOLIC ICA/CCA  RATIO:  1.7 ECA:  101 cm/sec LEFT ICA: 210/38 cm/sec CCA: 89/13 cm/sec SYSTOLIC ICA/CCA RATIO:  2.4 ECA:  99 cm/sec RIGHT CAROTID ARTERY: Heterogeneous atherosclerotic plaque in the proximal internal carotid artery. By peak systolic velocity criteria, the estimated stenosis is in the 50-69% diameter range. RIGHT VERTEBRAL ARTERY:  Patent with normal antegrade flow. LEFT CAROTID ARTERY: Heterogeneous atherosclerotic plaque in the proximal internal carotid artery. By peak systolic velocity criteria, the estimated stenosis falls in the 50-69% diameter range. LEFT VERTEBRAL ARTERY:  Patent with normal antegrade flow. IMPRESSION: 1. Moderate (50-69%) stenosis proximal right internal carotid artery secondary to heterogenous atherosclerotic plaque. 2. Moderate (50-69%) stenosis proximal left internal carotid artery secondary to heterogenous atherosclerotic plaque. 3. Vertebral arteries are patent with normal antegrade flow. 4. Degree of internal carotid artery stenosis is not significantly changed compared to prior imaging from 2010. Electronically Signed   By: Malachy Moan M.D.   On: 04/21/2021 10:46   EP PPM/ICD IMPLANT  Result Date: 04/23/2021 SURGEON:  Loman Brooklyn, MD   PREPROCEDURE DIAGNOSIS:  second degree AV block   POSTPROCEDURE DIAGNOSIS:  second degree AV block    PROCEDURES:  1. Pacemaker implantation.   INTRODUCTION:  ROSLIN NORWOOD is a 85 y.o. female with a history of bradycardia who presents today for pacemaker implantation.  The patient reports intermittent episodes of dizziness over the past few months.  No reversible causes have been identified.  The patient therefore presents today for pacemaker implantation.   DESCRIPTION OF PROCEDURE:  Informed written consent was obtained, and  the patient was brought to the electrophysiology lab in a fasting state.  The patient required no sedation for the procedure today.  The patients left chest was prepped and draped in the usual sterile fashion by  the EP lab staff. The skin overlying the left deltopectoral region was infiltrated with lidocaine for local analgesia.  A 4-cm incision was made over the left deltopectoral region.  A left subcutaneous pacemaker pocket was fashioned using a combination of sharp and blunt dissection. Electrocautery was required to assure hemostasis.  RA/RV Lead Placement: The left axillary vein was therefore cannulated.  Through the left axillary vein, a [employer] model Tendril MRI [id number] (serial number  [id number]) right atrial lead and an [employer] model Tendril MRI [id number] (serial number  [id number]) right ventricular lead were advanced with fluoroscopic visualization into the right atrial appendage and right ventricular apex positions respectively.  Initial atrial lead P- waves measured 2.8 mV with impedance of 429 ohms and a threshold of 0.8 V at 0.5 msec.  Right ventricular lead R-waves measured 8.6 mV with an impedance of 651 ohms and a threshold of 1.1 V  at 0.5 msec.  Both leads were secured to the pectoralis fascia using #2-0 silk over the suture sleeves. Device Placement:  The leads were then connected to an Eugene MRI  model [id number] (serial number  [id number] ) pacemaker.  The pocket was irrigated with copious gentamicin solution.  The pacemaker was then placed into the pocket.  The pocket was then closed in 3 layers with 2.0 Vicryl suture for the 3.0 Vicryl suture subcutaneous and subcuticular layers.  Steri-  Strips and a sterile dressing were then applied. EBL<34ml.  There were no early apparent complications.   CONCLUSIONS:  1. Successful implantation of a [employer] Assurity MRI dual-chamber pacemaker for symptomatic bradycardia  2. No early apparent complications.       Will Curt Bears, MD 04/23/2021 8:16 AM   US Venous Img Lower Bilateral (DVT)  Result Date: 04/21/2021 CLINICAL DATA:  Right calf swelling. Left leg pain. Left hip fracture in April 2022. EXAM: BILATERAL LOWER EXTREMITY  VENOUS DOPPLER ULTRASOUND TECHNIQUE: Gray-scale sonography with compression, as well as color and duplex ultrasound, were performed to evaluate the deep venous system(s) from the level of the common femoral vein through the popliteal and proximal calf veins. COMPARISON:  None. FINDINGS: VENOUS Normal compressibility of the common femoral, superficial femoral, and popliteal veins, as well as the visualized calf veins. Visualized portions of profunda femoral vein and great saphenous vein unremarkable. No filling defects to suggest DVT on grayscale or color Doppler imaging. Doppler waveforms show normal direction of venous flow, normal respiratory plasticity and response to augmentation. Limited views of the contralateral common femoral vein are unremarkable. OTHER Right calf subcutaneous edema. Limitations: none IMPRESSION: Negative. Electronically Signed   By: Claudie Revering M.D.   On: 04/21/2021 10:45   DG Chest Port 1 View  Result Date: 04/19/2021 CLINICAL DATA:  Bilateral leg swelling EXAM: PORTABLE CHEST 1 VIEW COMPARISON:  09/27/2020 FINDINGS: Heart size and vascularity within normal limits. Negative for heart failure. Atherosclerotic calcification aortic arch. Spinal cord stimulator unchanged. Bibasilar airspace disease has developed since the prior study. No significant effusion. Underlying COPD IMPRESSION: COPD with bibasilar atelectasis/infiltrate. Electronically Signed   By: Franchot Gallo M.D.   On: 04/19/2021 17:54   ECHOCARDIOGRAM COMPLETE  Result Date: 04/21/2021    ECHOCARDIOGRAM REPORT   Patient Name:   Mckenzie [name] Date of Exam: 04/21/2021 Medical Rec #:  [id number]         Height:       62.0 in Accession #:    [id number]        Weight:       102.5 lb Date of Birth:  Sep 29, 1935         BSA:          1.439 m Patient Age:    85 years          BP:           172/31 mmHg Patient Gender: F                 HR:           35 bpm. Exam Location:  [facility] Procedure: 2D Echo, Cardiac Doppler and  Color Doppler Indications:    Stroke  History:        Patient has prior history of Echocardiogram examinations, most                 recent 09/27/2020. CAD, COPD, Signs/Symptoms:Shortness of Breath;  Risk Factors:Hypertension and Dyslipidemia. LAD Stent 2003.  Sonographer:    Wenda Low Referring Phys: [id number] OLADAPO ADEFESO IMPRESSIONS  1. Left ventricular ejection fraction, by estimation, is 60 to 65%. The left ventricle has normal function. The left ventricle has no regional wall motion abnormalities. Left ventricular diastolic parameters are indeterminate.  2. Right ventricular systolic function is normal. The right ventricular size is normal. Mildly increased right ventricular wall thickness. There is mildly elevated pulmonary artery systolic pressure. The estimated right ventricular systolic pressure is [id number] mmHg.  3. The mitral valve is degenerative. Moderate mitral valve regurgitation. The mean mitral valve gradient is 6.0 mmHg with average heart rate of 38 bpm. No pulmonary vein assessment. Diastolic mitral regurgitation seen in the setting of heart block.  4. Left atrial size was severely dilated.  5. The pericardial effusion is posterior and lateral to the left ventricle.  6. The aortic valve is tricuspid. There is mild calcification of the aortic valve. There is mild thickening of the aortic valve. Aortic valve regurgitation is not visualized. Mild aortic valve stenosis. Aortic valve mean gradient measures 18.0 mmHg. Aortic valve Vmax measures 2.91 m/s. DVI 0.87 with normal LV stroke volume index.  7. Diastolic tricuspid regurgitation noted in the setting of heart block.  8. The inferior vena cava is normal in size with greater than 50% respiratory variability, suggesting right atrial pressure of 3 mmHg. Comparison(s): A prior study was performed on 09/27/20. Prior images reviewed side by side. Mitral regurgitation has increased; similar aortic valve parameters, imaged in sinus  tachycardia in last study. FINDINGS  Left Ventricle: Left ventricular ejection fraction, by estimation, is 60 to 65%. The left ventricle has normal function. The left ventricle has no regional wall motion abnormalities. The left ventricular internal cavity size was normal in size. There is  no left ventricular hypertrophy. Left ventricular diastolic parameters are indeterminate. Right Ventricle: The right ventricular size is normal. Mildly increased right ventricular wall thickness. Right ventricular systolic function is normal. There is mildly elevated pulmonary artery systolic pressure. The tricuspid regurgitant velocity is 2.86 m/s, and with an assumed right atrial pressure of 8 mmHg, the estimated right ventricular systolic pressure is [id number] mmHg. Left Atrium: Left atrial size was severely dilated. Right Atrium: Right atrial size was normal in size. Pericardium: Trivial pericardial effusion is present. The pericardial effusion is posterior and lateral to the left ventricle. Mitral Valve: The mitral valve is degenerative in appearance. Moderate mitral valve regurgitation. MV peak gradient, 17.8 mmHg. The mean mitral valve gradient is 6.0 mmHg with average heart rate of 38 bpm. Tricuspid Valve: The tricuspid valve is normal in structure. Tricuspid valve regurgitation is mild . No evidence of tricuspid stenosis. Aortic Valve: The aortic valve is tricuspid. There is mild calcification of the aortic valve. There is mild thickening of the aortic valve. Aortic valve regurgitation is not visualized. Mild aortic stenosis is present. Aortic valve mean gradient measures  18.0 mmHg. Aortic valve peak gradient measures 33.9 mmHg. Aortic valve area, by VTI measures 1.86 cm. Pulmonic Valve: The pulmonic valve was normal in structure. Pulmonic valve regurgitation is not visualized. No evidence of pulmonic stenosis. Aorta: The aortic root is normal in size and structure. Venous: The inferior vena cava is normal in size with  greater than 50% respiratory variability, suggesting right atrial pressure of 3 mmHg. IAS/Shunts: The atrial septum is grossly normal.  LEFT VENTRICLE PLAX 2D LVIDd:         4.40 cm   Diastology LVIDs:  2.90 cm   LV e' medial:   3.94 cm/s LV PW:         0.70 cm   LV E/e' medial: 36.5 LV IVS:        0.90 cm LVOT diam:     1.65 cm LV SV:         136 LV SV Index:   95 LVOT Area:     2.14 cm  RIGHT VENTRICLE RV Basal diam:  3.70 cm RV Mid diam:    2.60 cm RV S prime:     13.40 cm/s TAPSE (M-mode): 2.6 cm LEFT ATRIUM             Index        RIGHT ATRIUM           Index LA diam:        3.70 cm 2.57 cm/m   RA Area:     11.00 cm LA Vol (A2C):   74.2 ml 51.56 ml/m  RA Volume:   24.80 ml  17.23 ml/m LA Vol (A4C):   61.3 ml 42.60 ml/m LA Biplane Vol: 67.8 ml 47.12 ml/m  AORTIC VALVE                     PULMONIC VALVE AV Area (Vmax):    1.71 cm      PV Vmax:       1.46 m/s AV Area (Vmean):   1.73 cm      PV Peak grad:  8.5 mmHg AV Area (VTI):     1.86 cm AV Vmax:           291.00 cm/s AV Vmean:          180.500 cm/s AV VTI:            0.735 m AV Peak Grad:      33.9 mmHg AV Mean Grad:      18.0 mmHg LVOT Vmax:         233.00 cm/s LVOT Vmean:        146.000 cm/s LVOT VTI:          0.638 m LVOT/AV VTI ratio: 0.87  AORTA Ao Asc diam: 2.90 cm MITRAL VALVE                TRICUSPID VALVE MV Area (PHT): 2.37 cm     TR Peak grad:   32.7 mmHg MV Area VTI:   2.58 cm     TR Vmax:        286.00 cm/s MV Peak grad:  17.8 mmHg MV Mean grad:  6.0 mmHg     SHUNTS MV Vmax:       2.11 m/s     Systemic VTI:  0.64 m MV Vmean:      112.0 cm/s   Systemic Diam: 1.65 cm MV Decel Time: 320 msec MV E velocity: 144.00 cm/s MV A velocity: 154.00 cm/s MV E/A ratio:  0.94 Rudean Haskell MD Electronically signed by Rudean Haskell MD Signature Date/Time: 04/21/2021/11:02:38 AM    Final      Discharge Exam: Vitals:   04/24/21 0024 04/24/21 0330  BP: (!) 131/53 (!) 159/65  Pulse: 74 72  Resp: 19 17  Temp: 97.6 F (36.4 C)  (!) 97.3 F (36.3 C)  SpO2: 93% 95%   Vitals:   04/23/21 1614 04/23/21 1942 04/24/21 0024 04/24/21 0330  BP: (!) 160/62 (!) 162/65 (!) 131/53 (!) 159/65  Pulse:  87 74 72  Resp:  18 19 17   Temp:  97.9 F (36.6 C) 97.6 F (36.4 C) (!) 97.3 F (36.3 C)  TempSrc:  Oral Oral Oral  SpO2:  91% 93% 95%  Weight:    47.5 kg  Height:        General: Pt is alert, awake, not in acute distress Cardiovascular: RRR, S1/S2 +, no rubs, no gallops Respiratory: CTA bilaterally, no wheezing, no rhonchi Abdominal: Soft, NT, ND, bowel sounds + Extremities: no edema, no cyanosis    The results of significant diagnostics from this hospitalization (including imaging, microbiology, ancillary and laboratory) are listed below for reference.     Microbiology: Recent Results (from the past 240 hour(s))  Resp Panel by RT-PCR (Flu A&B, Covid) Nasopharyngeal Swab     Status: None   Collection Time: 04/19/21  6:37 PM   Specimen: Nasopharyngeal Swab; Nasopharyngeal(NP) swabs in vial transport medium  Result Value Ref Range Status   SARS Coronavirus 2 by RT PCR NEGATIVE NEGATIVE Final    Comment: (NOTE) SARS-CoV-2 target nucleic acids are NOT DETECTED.  The SARS-CoV-2 RNA is generally detectable in upper respiratory specimens during the acute phase of infection. The lowest concentration of SARS-CoV-2 viral copies this assay can detect is 138 copies/mL. A negative result does not preclude SARS-Cov-2 infection and should not be used as the sole basis for treatment or other patient management decisions. A negative result may occur with  improper specimen collection/handling, submission of specimen other than nasopharyngeal swab, presence of viral mutation(s) within the areas targeted by this assay, and inadequate number of viral copies(<138 copies/mL). A negative result must be combined with clinical observations, patient history, and epidemiological information. The expected result is Negative.  Fact  Sheet for Patients:  EntrepreneurPulse.com.au  Fact Sheet for Healthcare Providers:  IncredibleEmployment.be  This test is no t yet approved or cleared by the [place] FDA and  has been authorized for detection and/or diagnosis of SARS-CoV-2 by FDA under an Emergency Use Authorization (EUA). This EUA will remain  in effect (meaning this test can be used) for the duration of the COVID-19 declaration under Section 564(b)(1) of the Act, 21 U.S.C.section 360bbb-3(b)(1), unless the authorization is terminated  or revoked sooner.       Influenza A by PCR NEGATIVE NEGATIVE Final   Influenza B by PCR NEGATIVE NEGATIVE Final    Comment: (NOTE) The Xpert Xpress SARS-CoV-2/FLU/RSV plus assay is intended as an aid in the diagnosis of influenza from Nasopharyngeal swab specimens and should not be used as a sole basis for treatment. Nasal washings and aspirates are unacceptable for Xpert Xpress SARS-CoV-2/FLU/RSV testing.  Fact Sheet for Patients: EntrepreneurPulse.com.au  Fact Sheet for Healthcare Providers: IncredibleEmployment.be  This test is not yet approved or cleared by the [place] FDA and has been authorized for detection and/or diagnosis of SARS-CoV-2 by FDA under an Emergency Use Authorization (EUA). This EUA will remain in effect (meaning this test can be used) for the duration of the COVID-19 declaration under Section 564(b)(1) of the Act, 21 U.S.C. section 360bbb-3(b)(1), unless the authorization is terminated or revoked.  Performed at [facility], [address], [place], [place] [address]   MRSA Next Gen by PCR, Nasal     Status: None   Collection Time: 04/20/21 12:44 AM   Specimen: Nasal Mucosa; Nasal Swab  Result Value Ref Range Status   MRSA by PCR Next Gen NOT DETECTED NOT DETECTED Final    Comment: (NOTE) The GeneXpert MRSA Assay (FDA approved for NASAL specimens  only), is one  component of a comprehensive MRSA colonization surveillance program. It is not intended to diagnose MRSA infection nor to guide or monitor treatment for MRSA infections. Test performance is not FDA approved in patients less than 69 years old. Performed at [facility], [address], [place], [place] [address]   Surgical PCR screen     Status: None   Collection Time: 04/22/21  5:50 PM   Specimen: Nasal Mucosa; Nasal Swab  Result Value Ref Range Status   MRSA, PCR NEGATIVE NEGATIVE Final   Staphylococcus aureus NEGATIVE NEGATIVE Final    Comment: (NOTE) The Xpert SA Assay (FDA approved for NASAL specimens in patients 88 years of age and older), is one component of a comprehensive surveillance program. It is not intended to diagnose infection nor to guide or monitor treatment. Performed at [facility], [place] [address], [place], [place] [address]      Labs: BNP (last 3 results) Recent Labs    04/19/21 1648  BNP [id number]*   Basic Metabolic Panel: Recent Labs  Lab 04/20/21 0141 04/21/21 0459 04/22/21 0403 04/23/21 0345 04/24/21 0350  NA 143 140 141 140 139  K 3.3* 4.2 4.2 4.5 4.1  CL 112* 111 112* 111 110  CO2 22 22 25 22 24   GLUCOSE 118* 95 88 88 83  BUN 29* 35* 34* 24* 26*  CREATININE 0.69 0.99 0.67 0.68 0.64  CALCIUM 8.7* 8.1* 8.0* 8.0* 7.8*  MG  --  1.6* 1.7 2.0  --    Liver Function Tests: No results for input(s): AST, ALT, ALKPHOS, BILITOT, PROT, ALBUMIN in the last 168 hours. No results for input(s): LIPASE, AMYLASE in the last 168 hours. No results for input(s): AMMONIA in the last 168 hours. CBC: Recent Labs  Lab 04/19/21 1648 04/20/21 0141 04/21/21 0459 04/22/21 0403 04/23/21 0345  WBC 7.0 8.9 8.5 7.1 8.3  NEUTROABS 5.4  --   --   --   --   HGB 11.5* 11.3* 11.5* 12.9 13.2  HCT 34.8* 35.7* 37.9 42.3 40.8  MCV 95.1 94.9 99.2 100.0 94.2  PLT 177 197 188 180 183   Cardiac Enzymes: No results for input(s): CKTOTAL, CKMB, CKMBINDEX, TROPONINI in  the last 168 hours. BNP: Invalid input(s): POCBNP CBG: No results for input(s): GLUCAP in the last 168 hours. D-Dimer No results for input(s): DDIMER in the last 72 hours. Hgb A1c No results for input(s): HGBA1C in the last 72 hours. Lipid Profile No results for input(s): CHOL, HDL, LDLCALC, TRIG, CHOLHDL, LDLDIRECT in the last 72 hours. Thyroid function studies No results for input(s): TSH, T4TOTAL, T3FREE, THYROIDAB in the last 72 hours.  Invalid input(s): FREET3 Anemia work up No results for input(s): VITAMINB12, FOLATE, FERRITIN, TIBC, IRON, RETICCTPCT in the last 72 hours. Urinalysis    Component Value Date/Time   COLORURINE AMBER (A) 05/06/2018 1530   APPEARANCEUR CLOUDY (A) 05/06/2018 1530   LABSPEC 1.014 05/06/2018 1530   PHURINE 5.0 05/06/2018 1530   GLUCOSEU NEGATIVE 05/06/2018 1530   HGBUR MODERATE (A) 05/06/2018 1530   BILIRUBINUR NEGATIVE 05/06/2018 1530   KETONESUR NEGATIVE 05/06/2018 1530   PROTEINUR NEGATIVE 05/06/2018 1530   NITRITE NEGATIVE 05/06/2018 1530   LEUKOCYTESUR MODERATE (A) 05/06/2018 1530   Sepsis Labs Invalid input(s): PROCALCITONIN,  WBC,  LACTICIDVEN Microbiology Recent Results (from the past 240 hour(s))  Resp Panel by RT-PCR (Flu A&B, Covid) Nasopharyngeal Swab     Status: None   Collection Time: 04/19/21  6:37 PM  Specimen: Nasopharyngeal Swab; Nasopharyngeal(NP) swabs in vial transport medium  Result Value Ref Range Status   SARS Coronavirus 2 by RT PCR NEGATIVE NEGATIVE Final    Comment: (NOTE) SARS-CoV-2 target nucleic acids are NOT DETECTED.  The SARS-CoV-2 RNA is generally detectable in upper respiratory specimens during the acute phase of infection. The lowest concentration of SARS-CoV-2 viral copies this assay can detect is 138 copies/mL. A negative result does not preclude SARS-Cov-2 infection and should not be used as the sole basis for treatment or other patient management decisions. A negative result may occur with   improper specimen collection/handling, submission of specimen other than nasopharyngeal swab, presence of viral mutation(s) within the areas targeted by this assay, and inadequate number of viral copies(<138 copies/mL). A negative result must be combined with clinical observations, patient history, and epidemiological information. The expected result is Negative.  Fact Sheet for Patients:  EntrepreneurPulse.com.au  Fact Sheet for Healthcare Providers:  IncredibleEmployment.be  This test is no t yet approved or cleared by the [place] FDA and  has been authorized for detection and/or diagnosis of SARS-CoV-2 by FDA under an Emergency Use Authorization (EUA). This EUA will remain  in effect (meaning this test can be used) for the duration of the COVID-19 declaration under Section 564(b)(1) of the Act, 21 U.S.C.section 360bbb-3(b)(1), unless the authorization is terminated  or revoked sooner.       Influenza A by PCR NEGATIVE NEGATIVE Final   Influenza B by PCR NEGATIVE NEGATIVE Final    Comment: (NOTE) The Xpert Xpress SARS-CoV-2/FLU/RSV plus assay is intended as an aid in the diagnosis of influenza from Nasopharyngeal swab specimens and should not be used as a sole basis for treatment. Nasal washings and aspirates are unacceptable for Xpert Xpress SARS-CoV-2/FLU/RSV testing.  Fact Sheet for Patients: EntrepreneurPulse.com.au  Fact Sheet for Healthcare Providers: IncredibleEmployment.be  This test is not yet approved or cleared by the [place] FDA and has been authorized for detection and/or diagnosis of SARS-CoV-2 by FDA under an Emergency Use Authorization (EUA). This EUA will remain in effect (meaning this test can be used) for the duration of the COVID-19 declaration under Section 564(b)(1) of the Act, 21 U.S.C. section 360bbb-3(b)(1), unless the authorization is terminated  or revoked.  Performed at [facility], [address] [place], [place] [address]   MRSA Next Gen by PCR, Nasal     Status: None   Collection Time: 04/20/21 12:44 AM   Specimen: Nasal Mucosa; Nasal Swab  Result Value Ref Range Status   MRSA by PCR Next Gen NOT DETECTED NOT DETECTED Final    Comment: (NOTE) The GeneXpert MRSA Assay (FDA approved for NASAL specimens only), is one component of a comprehensive MRSA colonization surveillance program. It is not intended to diagnose MRSA infection nor to guide or monitor treatment for MRSA infections. Test performance is not FDA approved in patients less than 61 years old. Performed at [facility], [address], [place], [place] [address]   Surgical PCR screen     Status: None   Collection Time: 04/22/21  5:50 PM   Specimen: Nasal Mucosa; Nasal Swab  Result Value Ref Range Status   MRSA, PCR NEGATIVE NEGATIVE Final   Staphylococcus aureus NEGATIVE NEGATIVE Final    Comment: (NOTE) The Xpert SA Assay (FDA approved for NASAL specimens in patients 11 years of age and older), is one component of a comprehensive surveillance program. It is not intended to diagnose infection nor to guide or monitor treatment. Performed  at [facility], [place] [address] [place], [place] [address]      Time coordinating discharge: Over 30 minutes  SIGNED:   Darliss Cheney, MD  [facility] 04/24/2021, 11:07 AM  If 7PM-7AM, please contact night-coverage [email or web address]

## 2021-04-24 NOTE — Plan of Care (Signed)
  Problem: Clinical Measurements: Goal: Cardiovascular complication will be avoided Outcome: Progressing   

## 2021-04-28 DIAGNOSIS — I2510 Atherosclerotic heart disease of native coronary artery without angina pectoris: Secondary | ICD-10-CM | POA: Diagnosis not present

## 2021-04-28 DIAGNOSIS — G459 Transient cerebral ischemic attack, unspecified: Secondary | ICD-10-CM | POA: Diagnosis not present

## 2021-04-28 DIAGNOSIS — R001 Bradycardia, unspecified: Secondary | ICD-10-CM | POA: Diagnosis not present

## 2021-04-28 DIAGNOSIS — I10 Essential (primary) hypertension: Secondary | ICD-10-CM | POA: Diagnosis not present

## 2021-04-30 DIAGNOSIS — G459 Transient cerebral ischemic attack, unspecified: Secondary | ICD-10-CM | POA: Diagnosis not present

## 2021-04-30 DIAGNOSIS — I10 Essential (primary) hypertension: Secondary | ICD-10-CM | POA: Diagnosis not present

## 2021-04-30 DIAGNOSIS — R001 Bradycardia, unspecified: Secondary | ICD-10-CM | POA: Diagnosis not present

## 2021-04-30 DIAGNOSIS — I2510 Atherosclerotic heart disease of native coronary artery without angina pectoris: Secondary | ICD-10-CM | POA: Diagnosis not present

## 2021-05-01 DIAGNOSIS — R600 Localized edema: Secondary | ICD-10-CM | POA: Diagnosis not present

## 2021-05-01 DIAGNOSIS — M7989 Other specified soft tissue disorders: Secondary | ICD-10-CM | POA: Diagnosis not present

## 2021-05-01 DIAGNOSIS — M79604 Pain in right leg: Secondary | ICD-10-CM | POA: Diagnosis not present

## 2021-05-04 ENCOUNTER — Emergency Department (HOSPITAL_COMMUNITY): Payer: Medicare Other

## 2021-05-04 ENCOUNTER — Emergency Department (HOSPITAL_COMMUNITY)
Admission: EM | Admit: 2021-05-04 | Discharge: 2021-05-04 | Disposition: A | Discharge status: Home / Self Care | Payer: Medicare Other | Attending: Emergency Medicine | Admitting: Emergency Medicine

## 2021-05-04 ENCOUNTER — Other Ambulatory Visit: Payer: Self-pay

## 2021-05-04 ENCOUNTER — Encounter (HOSPITAL_COMMUNITY): Payer: Self-pay

## 2021-05-04 DIAGNOSIS — I2510 Atherosclerotic heart disease of native coronary artery without angina pectoris: Secondary | ICD-10-CM | POA: Diagnosis not present

## 2021-05-04 DIAGNOSIS — R2242 Localized swelling, mass and lump, left lower limb: Secondary | ICD-10-CM | POA: Diagnosis present

## 2021-05-04 DIAGNOSIS — Z7951 Long term (current) use of inhaled steroids: Secondary | ICD-10-CM | POA: Insufficient documentation

## 2021-05-04 DIAGNOSIS — R918 Other nonspecific abnormal finding of lung field: Secondary | ICD-10-CM | POA: Diagnosis not present

## 2021-05-04 DIAGNOSIS — D72829 Elevated white blood cell count, unspecified: Secondary | ICD-10-CM | POA: Insufficient documentation

## 2021-05-04 DIAGNOSIS — J439 Emphysema, unspecified: Secondary | ICD-10-CM | POA: Diagnosis not present

## 2021-05-04 DIAGNOSIS — R600 Localized edema: Secondary | ICD-10-CM | POA: Insufficient documentation

## 2021-05-04 DIAGNOSIS — E039 Hypothyroidism, unspecified: Secondary | ICD-10-CM | POA: Diagnosis not present

## 2021-05-04 DIAGNOSIS — Z20822 Contact with and (suspected) exposure to covid-19: Secondary | ICD-10-CM | POA: Diagnosis not present

## 2021-05-04 DIAGNOSIS — R911 Solitary pulmonary nodule: Secondary | ICD-10-CM | POA: Diagnosis not present

## 2021-05-04 DIAGNOSIS — Z7982 Long term (current) use of aspirin: Secondary | ICD-10-CM | POA: Diagnosis not present

## 2021-05-04 DIAGNOSIS — Z951 Presence of aortocoronary bypass graft: Secondary | ICD-10-CM | POA: Insufficient documentation

## 2021-05-04 DIAGNOSIS — J449 Chronic obstructive pulmonary disease, unspecified: Secondary | ICD-10-CM | POA: Insufficient documentation

## 2021-05-04 DIAGNOSIS — J90 Pleural effusion, not elsewhere classified: Secondary | ICD-10-CM | POA: Diagnosis not present

## 2021-05-04 DIAGNOSIS — J181 Lobar pneumonia, unspecified organism: Secondary | ICD-10-CM | POA: Diagnosis not present

## 2021-05-04 DIAGNOSIS — Z79899 Other long term (current) drug therapy: Secondary | ICD-10-CM | POA: Insufficient documentation

## 2021-05-04 DIAGNOSIS — J9811 Atelectasis: Secondary | ICD-10-CM | POA: Diagnosis not present

## 2021-05-04 DIAGNOSIS — I3139 Other pericardial effusion (noninflammatory): Secondary | ICD-10-CM | POA: Diagnosis not present

## 2021-05-04 DIAGNOSIS — I10 Essential (primary) hypertension: Secondary | ICD-10-CM | POA: Diagnosis not present

## 2021-05-04 DIAGNOSIS — Z87891 Personal history of nicotine dependence: Secondary | ICD-10-CM | POA: Insufficient documentation

## 2021-05-04 DIAGNOSIS — R0602 Shortness of breath: Secondary | ICD-10-CM | POA: Diagnosis not present

## 2021-05-04 DIAGNOSIS — J189 Pneumonia, unspecified organism: Secondary | ICD-10-CM | POA: Diagnosis not present

## 2021-05-04 DIAGNOSIS — I700 Atherosclerosis of aorta: Secondary | ICD-10-CM | POA: Diagnosis not present

## 2021-05-04 DIAGNOSIS — J811 Chronic pulmonary edema: Secondary | ICD-10-CM | POA: Diagnosis not present

## 2021-05-04 LAB — CBC WITH DIFFERENTIAL/PLATELET
Abs Immature Granulocytes: 0.03 10*3/uL (ref 0.00–0.07)
Basophils Absolute: 0 10*3/uL (ref 0.0–0.1)
Basophils Relative: 0 %
Eosinophils Absolute: 0.1 10*3/uL (ref 0.0–0.5)
Eosinophils Relative: 1 %
HCT: 36.2 % (ref 36.0–46.0)
Hemoglobin: 11.5 g/dL — ABNORMAL LOW (ref 12.0–15.0)
Immature Granulocytes: 0 %
Lymphocytes Relative: 11 %
Lymphs Abs: 1 10*3/uL (ref 0.7–4.0)
MCH: 30.4 pg (ref 26.0–34.0)
MCHC: 31.8 g/dL (ref 30.0–36.0)
MCV: 95.8 fL (ref 80.0–100.0)
Monocytes Absolute: 0.7 10*3/uL (ref 0.1–1.0)
Monocytes Relative: 8 %
Neutro Abs: 7 10*3/uL (ref 1.7–7.7)
Neutrophils Relative %: 80 %
Platelets: 141 10*3/uL — ABNORMAL LOW (ref 150–400)
RBC: 3.78 MIL/uL — ABNORMAL LOW (ref 3.87–5.11)
RDW: 13.7 % (ref 11.5–15.5)
WBC: 8.7 10*3/uL (ref 4.0–10.5)
nRBC: 0 % (ref 0.0–0.2)

## 2021-05-04 LAB — COMPREHENSIVE METABOLIC PANEL
ALT: 19 U/L (ref 0–44)
AST: 24 U/L (ref 15–41)
Albumin: 3.1 g/dL — ABNORMAL LOW (ref 3.5–5.0)
Alkaline Phosphatase: 81 U/L (ref 38–126)
Anion gap: 8 (ref 5–15)
BUN: 12 mg/dL (ref 8–23)
CO2: 26 mmol/L (ref 22–32)
Calcium: 8.2 mg/dL — ABNORMAL LOW (ref 8.9–10.3)
Chloride: 106 mmol/L (ref 98–111)
Creatinine, Ser: 0.6 mg/dL (ref 0.44–1.00)
GFR, Estimated: >60 mL/min (ref >60)
Glucose, Bld: 104 mg/dL — ABNORMAL HIGH (ref 70–99)
Potassium: 3 mmol/L — ABNORMAL LOW (ref 3.5–5.1)
Sodium: 140 mmol/L (ref 135–145)
Total Bilirubin: 0.5 mg/dL (ref 0.3–1.2)
Total Protein: 5.7 g/dL — ABNORMAL LOW (ref 6.5–8.1)

## 2021-05-04 LAB — RESP PANEL BY RT-PCR (FLU A&B, COVID) ARPGX2
Influenza A by PCR: NEGATIVE
Influenza B by PCR: NEGATIVE
SARS Coronavirus 2 by RT PCR: NEGATIVE

## 2021-05-04 LAB — TROPONIN I (HIGH SENSITIVITY)
Troponin I (High Sensitivity): 20 ng/L — ABNORMAL HIGH (ref <18)
Troponin I (High Sensitivity): 17 ng/L (ref <18)

## 2021-05-04 LAB — BRAIN NATRIURETIC PEPTIDE: B Natriuretic Peptide: 263 pg/mL — ABNORMAL HIGH (ref 0.0–100.0)

## 2021-05-04 MED ORDER — POTASSIUM CHLORIDE 10 MEQ/100ML IV SOLN
10.0000 meq | Freq: Once | INTRAVENOUS | Status: AC
  Administered 2021-05-04: 10 meq via INTRAVENOUS
  Filled 2021-05-04: qty 100

## 2021-05-04 MED ORDER — LEVOFLOXACIN 750 MG PO TABS: 750.0000 mg | ORAL_TABLET | Freq: Every day | ORAL | 0 refills | Status: AC

## 2021-05-04 MED ORDER — VANCOMYCIN HCL 1000 MG/200ML IV SOLN
1000.0000 mg | Freq: Once | INTRAVENOUS | Status: AC
  Administered 2021-05-04: 1000 mg via INTRAVENOUS
  Filled 2021-05-04: qty 200

## 2021-05-04 MED ORDER — SODIUM CHLORIDE 0.9 % IV SOLN
2.0000 g | Freq: Once | INTRAVENOUS | Status: AC
  Administered 2021-05-04: 2 g via INTRAVENOUS
  Filled 2021-05-04: qty 2

## 2021-05-04 MED ORDER — POTASSIUM CHLORIDE CRYS ER 20 MEQ PO TBCR: 40.0000 meq | EXTENDED_RELEASE_TABLET | Freq: Every day | ORAL | 0 refills | Status: DC

## 2021-05-04 MED ORDER — IOHEXOL 300 MG/ML  SOLN
75.0000 mL | Freq: Once | INTRAMUSCULAR | Status: AC | PRN
  Administered 2021-05-04: 75 mL via INTRAVENOUS

## 2021-05-04 MED ORDER — FUROSEMIDE 10 MG/ML IJ SOLN
20.0000 mg | Freq: Once | INTRAMUSCULAR | Status: AC
  Administered 2021-05-04: 20 mg via INTRAVENOUS
  Filled 2021-05-04: qty 2

## 2021-05-04 MED ORDER — FUROSEMIDE 20 MG PO TABS: 20.0000 mg | ORAL_TABLET | Freq: Every day | ORAL | 0 refills | Status: DC

## 2021-05-04 NOTE — Discharge Instructions (Signed)
You were seen in the emergency department today for swelling to your lower leg and cough.  While you were here we did a thorough work-up which showed that you have some pneumonia.  It is also concerning that your leg may have an infection called cellulitis.  We gave you some antibiotics here in the emergency department.  We are can send you home with antibiotics to take daily for the next 7 days.  I am also giving you a fluid pill to take daily until you are able to see her cardiologist.  I am giving you a potassium pill which helps replenish your potassium as the fluid pill can make it too low.  I am also having you follow-up with an ultrasound of your right lower leg.  The ultrasound technician will call you to have this scheduled.  You need to follow-up with your cardiologist this week.  I have placed his information in your discharge paperwork.  Please return to the emergency department if you begin to have chest pain, difficulty breathing, high fever or worsening of the swelling in your legs.

## 2021-05-04 NOTE — ED Triage Notes (Signed)
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 Right > Left

## 2021-05-04 NOTE — ED Notes (Signed)
Pt husband brought stimulator remote. Pt going to CT then starting abx.

## 2021-05-04 NOTE — ED Provider Notes (Signed)
[facility] EMERGENCY DEPARTMENT Provider Note   CSN: [id number] Arrival date & time: 05/04/21  1419     History Chief Complaint  Patient presents with   Leg Swelling    Mckenzie [name] is a 85 y.o. female.  With past medical history significant for coronary artery disease status post multiple stents, COPD, MI who presents emergency department with lower extremity swelling.  She states she has had lower extremity swelling for 3 weeks now.  States that it is progressively worsening, associated with ulcers on her left lower extremity, warmth.  She denies pain to the legs.  States that right leg has been more swollen than the left.  She states this morning that her legs began weeping.  She denies fevers.  Additionally, she states she has had increased shortness of breath over the past 3 days.  Denies chest pain, palpitations, cough.  She denies abdominal pain, dysuria (although she has chronic catheter placement), nausea, vomiting or diarrhea.  Of note she was recently admitted from 04/19/2021 to 04/24/2021.  It appears that she presented to the emergency department for chronic leg ulcerations on her left lower leg.  She is followed by wound care, and was on doxycycline for left lower extremity cellulitis.  Doxycycline ended on 04/28/2021.  She stated that she was having palpitations and difficulty breathing.  She was noted to be bradycardic to the 30s in the emergency department.  While she was admitted she had pacemaker placement.  She also had TIA during her stay.  Symptoms resolved.  HPI     Past Medical History:  Diagnosis Date   Aortic valve sclerosis    Mild, echo, April, 2012   ARDS (adult respiratory distress syndrome) ([place]) 2003   with tracheostomy-peritonitis   Arthritis    Back pain    Need for surgery, Dr. Glenna Fellows   CAD (coronary artery disease)    Stent to LAD, 2003 [facility] / nuclear scan April, 201 to artifact / persistent shortness of breath / catheterization Oct 21, 2010... widely patent stent to the mid LAD, no significant obstructive disease, should be low risk    for back surgery   Carotid arterial disease ([place])    Moderate-Dr. Trula Slade following   Chronic back pain    COPD (chronic obstructive pulmonary disease) ([place])    Diastolic dysfunction    Echo, April, 2011   EF 65%   Dysrhythmia    afib   Ejection fraction    EF 65%, echo, September 15, 2010   GERD (gastroesophageal reflux disease)    Hemolytic anemia ([place])    Followed by Dr. Janae Sauce   Hypertension    Hypothyroidism    Left ventricular hypertrophy    EF 65%, echo, April, 2011   MR (mitral regurgitation)     Mild echo, 2012 ///  Mild per Echo 2009,  mild, echo, April, 2011   Myocardial infarction [facility])    Palpitations    Holter, September 16, 2010, PACs and PVCs,one 4 beat run of SVT   Pneumonia 11-2011   Preoperative clearance    Preop clearance for back surgery   Shortness of breath    March, 2012   UTI (urinary tract infection)    Chronic-on Macrodantin    Patient Active Problem List   Diagnosis Date Noted   Cellulitis of left lower extremity 04/24/2021   Heart block, AV 04/21/2021   Leg wound, left, initial encounter 04/21/2021   Bradycardia 04/19/2021   Lower extremity ulceration ([place]) 04/19/2021  Nonrheumatic aortic valve stenosis 12/19/2020   Acute blood loss anemia    Essential hypertension    Postoperative pain    Femur fracture, left ([place]) 10/03/2020   Lt Hip Hip fracture -s/p Mechanical Fall 09/27/2020   COPD (chronic obstructive pulmonary disease) ([place]) 09/27/2020   HTN (hypertension) 09/27/2020   Dyslipidemia 04/18/2020   Coronary artery disease involving native coronary artery of native heart without angina pectoris 04/18/2020   Essential (primary) hypertension 04/18/2020   Irritable bowel syndrome (IBS) 10/04/2019   CAP (community acquired pneumonia) 05/06/2018   Hypersomnolence 05/06/2018   Acute renal failure (ARF) ([place]) 05/06/2018   Dehydration 05/06/2018    Hypotension 05/06/2018   Displacement of lumbar intervertebral disc without myelopathy 02/19/2017   Lumbar spondylosis 02/19/2017   Current chronic use of systemic steroids 07/04/2015   Nontoxic multinodular goiter 05/01/2014   Easy bruisability 08/23/2013   Occlusion and stenosis of carotid artery without mention of cerebral infarction 03/21/2012   Hypothyroidism, postsurgical 02/02/2012   Preoperative clearance    CAD (coronary artery disease)/Stent to LAD, 2003 [facility]    Palpitations    Ejection fraction    MR (mitral regurgitation)    Left ventricular hypertrophy    Mild Aortic valve sclerosis    Carotid arterial disease ([facility])    GERD (gastroesophageal reflux disease)    Hypothyroidism    ARDS (adult respiratory distress syndrome) ([facility])    Hemolytic anemia ([facility])    Diastolic dysfunction    Shortness of breath    Back pain    Hyperlipidemia 09/10/2009   GERD 09/10/2009    Past Surgical History:  Procedure Laterality Date   APPENDECTOMY     BACK SURGERY     BREAST LUMPECTOMY Left    CATARACT EXTRACTION W/PHACO Right 12/05/2012   Procedure: CATARACT EXTRACTION PHACO AND INTRAOCULAR LENS PLACEMENT ([place]);  Surgeon: Tonny Branch, MD;  Location: [facility];  Service: Ophthalmology;  Laterality: Right;  CDE:15.30   CATARACT EXTRACTION W/PHACO Left 12/15/2012   Procedure: CATARACT EXTRACTION PHACO AND INTRAOCULAR LENS PLACEMENT ([facility]);  Surgeon: Tonny Branch, MD;  Location: [facility];  Service: Ophthalmology;  Laterality: Left;  CDE:22.56   CORONARY ANGIOPLASTY WITH STENT PLACEMENT  2003   INTRAMEDULLARY (IM) NAIL INTERTROCHANTERIC Left 09/27/2020   Procedure: INTRAMEDULLARY (IM) NAIL INTERTROCHANTRIC;  Surgeon: Meredith Pel, MD;  Location: [place];  Service: Orthopedics;  Laterality: Left;  left trochanter   PACEMAKER IMPLANT N/A 04/23/2021   Procedure: PACEMAKER IMPLANT;  Surgeon: Constance Haw, MD;  Location: [place] [facility];  Service: Cardiovascular;  Laterality: N/A;   repair  of ruptured lumbar discs  12-2011   SPINE SURGERY     lumbar disk removal by Dr. Francesco Runner at [place]  08/2009   Benign adenomas   TONSILLECTOMY     TRACHEOSTOMY  2003   Following ARDS      OB History   No obstetric history on file.     Family History  Problem Relation Age of Onset   Heart failure Father    Diabetes Son     Social History   Tobacco Use   Smoking status: Former    Packs/day: 1.00    Years: 20.00    Pack years: 20.00    Types: Cigarettes    Quit date: 06/15/2000    Years since quitting: 20.8   Smokeless tobacco: Never  Vaping Use   Vaping Use: Never used  Substance Use Topics   Alcohol use: No   Drug  use: No    Home Medications Prior to Admission medications   Medication Sig Start Date End Date Taking? Authorizing Provider  acetaminophen (TYLENOL) 325 MG tablet Take 1-2 tablets (325-650 mg total) by mouth every 4 (four) hours as needed for mild pain. 10/11/20  Yes Love, Ivan Anchors, PA-C  atorvastatin (LIPITOR) 40 MG tablet Take 1 tablet (40 mg total) by mouth daily. 04/25/21 05/25/21 Yes Pahwani, Einar Grad, MD  benazepril (LOTENSIN) 20 MG tablet Take 1 tablet (20 mg total) by mouth daily. 04/25/21 05/25/21 Yes Pahwani, Einar Grad, MD  Cholecalciferol (VITAMIN D3) 1000 UNITS CAPS Take 1,000 Units by mouth daily. Take one tab daily   Yes [provider]  conjugated estrogens (PREMARIN) vaginal cream Place 1 Applicatorful vaginally daily.   Yes [provider]  diphenhydrAMINE (BENADRYL) 25 mg capsule Take 25 mg by mouth daily as needed for sleep.   Yes [provider]  esomeprazole (NEXIUM) 40 MG capsule Take 40 mg by mouth daily before breakfast.   Yes [provider]  FERREX 150 150 MG capsule Take 150 mg by mouth daily. 04/25/21  Yes [provider]  hydrALAZINE (APRESOLINE) 25 MG tablet Take 1 tablet (25 mg total) by mouth every 8 (eight) hours. 04/24/21 05/24/21 Yes Pahwani, Einar Grad, MD   HYDROcodone-acetaminophen (NORCO) 10-325 MG tablet Take 1 tablet by mouth every 4 (four) hours as needed for severe pain. 10/11/20  Yes Love, Ivan Anchors, PA-C  hyoscyamine (LEVSIN SL) 0.125 MG SL tablet Take 0.125 mg by mouth every 6 (six) hours as needed for cramping or diarrhea or loose stools.   Yes [provider]  levothyroxine (SYNTHROID) 25 MCG tablet TAKE 1 TABLET BY MOUTH DAILY AT 6 AM. Patient taking differently: Take 25 mcg by mouth daily before breakfast. 03/24/21  Yes Raulkar, Clide Deutscher, MD  lubiprostone (AMITIZA) 24 MCG capsule Take by mouth. 03/03/21  Yes [provider]  Melatonin 10 MG TABS Take 1 tablet by mouth at bedtime.   Yes [provider]  MOVANTIK 25 MG TABS tablet Take 25 mg by mouth daily. 09/16/20  Yes [provider]  Multiple Vitamin (MULTIVITAMIN) tablet Take 1 tablet by mouth daily.   Yes [provider]  nitroGLYCERIN (NITROSTAT) 0.4 MG SL tablet Place under the tongue.   Yes [provider]  polyethylene glycol (MIRALAX / GLYCOLAX) 17 g packet Take 17 g by mouth every morning.   Yes [provider]  raloxifene (EVISTA) 60 MG tablet Take 60 mg by mouth daily. 04/13/19  Yes [provider]  tamsulosin (FLOMAX) 0.4 MG CAPS capsule Take 0.4 mg by mouth daily. 04/06/19  Yes [provider]  temazepam (RESTORIL) 30 MG capsule Take 30 mg by mouth at bedtime.   Yes [provider]  traMADol (ULTRAM) 50 MG tablet 1  po q hs prn pain 01/20/21  Yes Meredith Pel, MD  traZODone (DESYREL) 50 MG tablet Take 50 mg by mouth at bedtime as needed for sleep. 06/28/19  Yes [provider]  tretinoin (RETIN-A) 0.05 % cream Apply 1 application topically at bedtime.   Yes [provider]  vitamin C (ASCORBIC ACID) 500 MG tablet Take 500 mg by mouth daily.   Yes [provider]  aspirin 81 MG chewable tablet Chew 1 tablet (81 mg total) by mouth 2 (two) times daily. Patient  not taking: Reported on 04/19/2021 10/11/20   Love, Ivan Anchors, PA-C  Calcium Carbonate (CALCIUM 600 PO) Take 1,200 mg by mouth daily. Patient  not taking: Reported on 05/04/2021    [provider]  estrogens, conjugated, (PREMARIN) 0.625 MG tablet Take 0.625 mg by mouth daily. Patient not taking: Reported on 05/04/2021    [provider]  methocarbamol (ROBAXIN) 500 MG tablet Take 1 tablet (500 mg total) by mouth every 6 (six) hours as needed for muscle spasms. Patient not taking: Reported on 05/04/2021 10/11/20   Love, Evlyn Kanner, PA-C  metoprolol tartrate (LOPRESSOR) 25 MG tablet TAKE (1) TABLET TWICE DAILY. Patient not taking: Reported on 05/04/2021 10/12/20   Raulkar, Drema Pry, MD  mometasone-formoterol (DULERA) 100-5 MCG/ACT AERO Inhale 2 puffs into the lungs 2 (two) times daily. Patient not taking: Reported on 05/04/2021 10/11/20   Jacquelynn Cree, PA-C    Allergies    Tetracycline  Review of Systems   Review of Systems  Constitutional:  Negative for chills and fever.  Respiratory:  Positive for shortness of breath. Negative for cough.   Cardiovascular:  Positive for leg swelling. Negative for chest pain and palpitations.  Gastrointestinal:  Negative for abdominal pain, diarrhea, nausea and vomiting.  Genitourinary:  Negative for dysuria.  Skin:  Positive for wound.  Neurological:  Negative for dizziness and light-headedness.  All other systems reviewed and are negative.  Physical Exam Updated Vital Signs BP (!) 152/65 (BP Location: Right Arm)   Pulse 81   Temp 98 F (36.7 C) (Oral)   Resp 18   Ht 5\' 2"  (1.575 m)   Wt 47.5 kg   SpO2 94%   BMI 19.15 kg/m   Physical Exam Vitals and nursing note reviewed.  Constitutional:      General: She is not in acute distress.    Appearance: Normal appearance. She is ill-appearing. She is not toxic-appearing.  HENT:     Head: Normocephalic and atraumatic.     Nose: Nose normal.  Eyes:     General: No scleral  icterus. Cardiovascular:     Rate and Rhythm: Normal rate and regular rhythm.     Pulses: Normal pulses.          Radial pulses are 2+ on the right side and 2+ on the left side.       Dorsalis pedis pulses are 2+ on the right side and 2+ on the left side.     Heart sounds: Murmur heard.  Systolic murmur is present.  Pulmonary:     Effort: Pulmonary effort is normal. No respiratory distress.     Breath sounds: Examination of the right-middle field reveals rhonchi. Examination of the right-lower field reveals rhonchi. Examination of the left-lower field reveals rales. Rhonchi and rales present.  Abdominal:     General: Abdomen is flat. Bowel sounds are normal. There is no distension.     Palpations: Abdomen is soft.     Tenderness: There is no abdominal tenderness.  Musculoskeletal:        General: Swelling present.     Cervical back: Normal range of motion.     Right lower leg: 3+ Pitting Edema present.     Left lower leg: 2+ Pitting Edema present.  Skin:    General: Skin is warm and dry.     Capillary Refill: Capillary refill takes less than 2 seconds.     Findings: Bruising present.  Neurological:     General: No focal deficit present.     Mental Status: She is alert and oriented to person, place, and time. Mental status is at baseline.  Psychiatric:  Mood and Affect: Mood normal.        Behavior: Behavior normal.    ED Results / Procedures / Treatments   Labs (all labs ordered are listed, but only abnormal results are displayed) Labs Reviewed  COMPREHENSIVE METABOLIC PANEL - Abnormal; Notable for the following components:      Result Value   Potassium 3.0 (*)    Glucose, Bld 104 (*)    Calcium 8.2 (*)    Total Protein 5.7 (*)    Albumin 3.1 (*)    All other components within normal limits  CBC WITH DIFFERENTIAL/PLATELET - Abnormal; Notable for the following components:   RBC 3.78 (*)    Hemoglobin 11.5 (*)    Platelets 141 (*)    All other components within  normal limits  BRAIN NATRIURETIC PEPTIDE - Abnormal; Notable for the following components:   B Natriuretic Peptide 263.0 (*)    All other components within normal limits  TROPONIN I (HIGH SENSITIVITY) - Abnormal; Notable for the following components:   Troponin I (High Sensitivity) 20 (*)    All other components within normal limits  RESP PANEL BY RT-PCR (FLU A&B, COVID) [id number]  URINALYSIS, ROUTINE W REFLEX MICROSCOPIC  TROPONIN I (HIGH SENSITIVITY)    EKG None  Radiology DG Chest 2 View  Result Date: 05/04/2021 CLINICAL DATA:  Shortness of breath EXAM: CHEST - 2 VIEW COMPARISON:  04/24/2021 FINDINGS: Transverse diameter of heart is increased. Central pulmonary vessels are less prominent. There is interval decrease in interstitial markings in the parahilar regions and lower lung fields. There is interval decrease in left pleural effusion. There are no new focal infiltrates. There is no evidence of apical pneumothorax. There is transverse linear lucency adjacent to the right hemidiaphragm. IMPRESSION: There is interval decrease in pulmonary vascular congestion and pulmonary edema. No new focal pulmonary infiltrates are seen. There is transverse radiolucency adjacent to the dome of right hemidiaphragm which was not evident in the previous examination. Differential diagnostic possibilities would include an artifact due to confluence of small infiltrate in the right lower lung fields or small right pneumoperitoneum or loculated right pneumothorax. Follow-up upright PA and lateral views of chest along with CT as warranted should be considered. Report will be called to the provider by PR[facility]. Electronically Signed   By: Elmer Picker M.D.   On: 05/04/2021 16:40   CT Chest W Contrast  Result Date: 05/04/2021 CLINICAL DATA:  Pneumonia, effusion, or abscess. Abnormal chest x-ray. EXAM: CT CHEST WITH CONTRAST TECHNIQUE: Multidetector CT imaging of the chest was performed during intravenous contrast  administration. CONTRAST:  52mL OMNIPAQUE IOHEXOL 300 MG/ML  SOLN COMPARISON:  Chest x-ray May 04, 2021. FINDINGS: Cardiovascular: Three-vessel coronary artery disease identified. A 2 lead pacemaker is identified. Calcified atherosclerosis is seen in the nonaneurysmal thoracic aorta without dissection. Central pulmonary arteries are normal in caliber. The study is not tailored to evaluate pulmonary emboli but none are seen. Mediastinum/Nodes: Small bilateral pleural effusions with atelectasis. There is a small pericardial effusion is well measuring up to 9 mm in thickness. The left thyroid lobe is normal. The patient appears to be status post removal of the right thyroid lobe and isthmus. The esophagus is normal. No adenopathy. The chest wall demonstrates increased attenuation in the subcutaneous fat consistent with volume overload. The chest wall is otherwise normal. Lungs/Pleura: Central airways are normal. No pneumothorax. Emphysematous changes are seen in the lungs. Mild opacity in the right apex on axial image 21 coronal image 60 could  represent infiltrate or scarring. A 5 mm nodule seen in the periphery of the right upper lobe on coronal image 29 and axial image 60. No other pulmonary nodules. No masses or other atelectasis in the bases. Infiltrates. Upper Abdomen: No pneumoperitoneum to correlate with chest x-ray findings. Cholelithiasis. Musculoskeletal: Age-indeterminate compression fracture of L1, likely nonacute. Degenerative changes in the visualized spine. IMPRESSION: 1. Mild opacity in the right apex could represent scarring or developing infiltrate/pneumonia. Recommend short-term follow-up imaging to ensure resolution. 2. Small bilateral pleural effusions.  Small pericardial effusion. 3. Increased attenuation in the subcutaneous fat diffusely consistent with volume overload. 4. Emphysema. 5. 5 mm nodule in the right upper lobe. No follow-up needed if patient is low-risk. Non-contrast chest CT can  be considered in 12 months if patient is high-risk. This recommendation follows the consensus statement: Guidelines for Management of Incidental Pulmonary Nodules Detected on CT Images: From the Fleischner Society 2017; Radiology 2017; 284:228-243. 6. Three-vessel coronary artery disease. Calcified atherosclerosis in the nonaneurysmal thoracic aorta. 7. Age-indeterminate fracture of L1, favored to be nonacute. Recommend clinical correlation. Aortic Atherosclerosis (ICD10-I70.0) and Emphysema (ICD10-J43.9). Electronically Signed   By: Dorise Bullion III M.D.   On: 05/04/2021 20:44    Procedures Procedures   Medications Ordered in ED Medications  iohexol (OMNIPAQUE) 300 MG/ML solution 75 mL (75 mLs Intravenous Contrast Given 05/04/21 2001)  potassium chloride 10 mEq in 100 mL IVPB (0 mEq Intravenous Stopped 05/04/21 1956)  vancomycin (VANCOREADY) IVPB 1000 mg/200 mL (0 mg Intravenous Stopped 05/04/21 2159)  ceFEPIme (MAXIPIME) 2 g in sodium chloride 0.9 % 100 mL IVPB (0 g Intravenous Stopped 05/04/21 2159)  furosemide (LASIX) injection 20 mg (20 mg Intravenous Given 05/04/21 2159)    ED Course  I have reviewed the triage vital signs and the nursing notes.  Pertinent labs & imaging results that were available during my care of the patient were reviewed by me and considered in my medical decision making (see chart for details).    MDM Rules/Calculators/A&P 85 year old female who presents emergency department with lower extremity swelling and shortness of breath.  Appears that she has had ongoing lower extremity swelling complicated by left lower extremity cellulitis on previous admission.  She finished a course of doxycycline however she still appears to have wounds to the left lower extremity.  On exam the right lower extremity is more swollen than the left.  It is warm to touch without any lymphangitic streaking.  She does have bilateral lower extremity pitting edema.  As for her shortness of  breath she has rhonchi to the right lung base Rales to bilateral lower lobes.  Initial chest x-ray with interval decrease in pulmonary edema however abnormality in the right lower lobe requiring CT of her chest.  CT chest shows mild opacity in the right apex possible developing pneumonia.  She also has bilateral pleural effusions.  There is also increased subcutaneous fat consistent with volume overload. CBC with leukocytosis, no fevers Potassium 3.0, repleting BNP 263 Initial troponin 20, repeat 17.  EKG with left bundle branch block, paced rhythm.    Given her findings of likely cellulitis and pneumonia given Vanco and cefepime here in the emergency department.  Also given a dose of Lasix, 20 mg as she is Lasix nave and fluid volume overload. Ultrasound not available at this time any pending.  I have put in her discharge orders to have her evaluated with ultrasound DVT study of the right lower extremity tomorrow.  I have also given  her prescription for Levaquin which should cover the cellulitis and pneumonia.  I am also giving her a prescription for Lasix for her to take until she is able to see her cardiologist and prescription for potassium given that it was low and we are initiating Lasix. She has been hemodynamically stable throughout her stay.  I discussed this case with my attending physician who cosigned this note including patient's presenting symptoms, physical exam, and planned diagnostics and interventions. Attending physician stated agreement with plan or made changes to plan which were implemented.   Attending physician assessed patient at bedside.   Safe for discharge Final Clinical Impression(s) / ED Diagnoses Final diagnoses:  Leg edema  Community acquired pneumonia of right upper lobe of lung    Rx / DC Orders ED Discharge Orders          Ordered    US Venous Img Lower Unilateral Right        05/04/21 2225    furosemide (LASIX) 20 MG tablet  Daily        05/04/21 2225     levofloxacin (LEVAQUIN) 750 MG tablet  Daily        05/04/21 2225    potassium chloride SA (KLOR-CON) 20 MEQ tablet  Daily        05/04/21 2225             Mickie Hillier, PA-C 05/04/21 2325    Milton Ferguson, MD 05/05/21 1030

## 2021-05-04 NOTE — ED Notes (Signed)
Pt called out stating potassium was burning . Rate decreased

## 2021-05-05 ENCOUNTER — Ambulatory Visit (HOSPITAL_COMMUNITY)
Admission: RE | Admit: 2021-05-05 | Discharge: 2021-05-05 | Disposition: A | Discharge status: Home / Self Care | Payer: Medicare Other | Source: Ambulatory Visit | Attending: Student | Admitting: Student

## 2021-05-05 DIAGNOSIS — R600 Localized edema: Secondary | ICD-10-CM | POA: Diagnosis not present

## 2021-05-05 DIAGNOSIS — M7989 Other specified soft tissue disorders: Secondary | ICD-10-CM | POA: Diagnosis not present

## 2021-05-05 DIAGNOSIS — R609 Edema, unspecified: Secondary | ICD-10-CM | POA: Insufficient documentation

## 2021-05-06 ENCOUNTER — Other Ambulatory Visit: Payer: Self-pay

## 2021-05-06 ENCOUNTER — Ambulatory Visit (INDEPENDENT_AMBULATORY_CARE_PROVIDER_SITE_OTHER): Payer: Medicare Other

## 2021-05-06 DIAGNOSIS — I4430 Unspecified atrioventricular block: Secondary | ICD-10-CM | POA: Diagnosis not present

## 2021-05-06 LAB — CUP PACEART INCLINIC DEVICE CHECK
Battery Remaining Longevity: 61 mo
Battery Voltage: 3.01 V
Brady Statistic RA Percent Paced: 3.7 %
Brady Statistic RV Percent Paced: 99.78 %
Date Time Interrogation Session: 20221122141634
Implantable Lead Implant Date: 20221109
Implantable Lead Implant Date: 20221109
Implantable Lead Location: 753859
Implantable Lead Location: 753860
Implantable Pulse Generator Implant Date: 20221109
Lead Channel Impedance Value: 325 Ohm
Lead Channel Impedance Value: 375 Ohm
Lead Channel Pacing Threshold Amplitude: 0.75 V
Lead Channel Pacing Threshold Amplitude: 1 V
Lead Channel Pacing Threshold Pulse Width: 0.5 ms
Lead Channel Pacing Threshold Pulse Width: 0.5 ms
Lead Channel Sensing Intrinsic Amplitude: 3.1 mV
Lead Channel Sensing Intrinsic Amplitude: 4.6 mV
Lead Channel Setting Pacing Amplitude: 3.5 V
Lead Channel Setting Pacing Amplitude: 3.5 V
Lead Channel Setting Pacing Pulse Width: 0.5 ms
Lead Channel Setting Sensing Sensitivity: 2 mV
Pulse Gen Model: 2272
Pulse Gen Serial Number: 3956509

## 2021-05-06 NOTE — Patient Instructions (Signed)
   After Your Pacemaker   Monitor your pacemaker site for redness, swelling, and drainage. Call the device clinic at [phone] if you experience these symptoms or fever/chills. Your incision was closed with Steri-strips. You may now shower your and wash your incision with soap and water. Avoid lotions, ointments, or perfumes over your incision until it is well-healed.  You may use a hot tub or a pool after your wound check appointment if the incision is completely closed.  Do not lift, push or pull greater than 10 pounds with the affected arm until 6 weeks after your procedure. There are no other restrictions in arm movement after your wound check appointment.  You may drive, unless driving has been restricted by your healthcare providers.  Your Pacemaker is MRI compatible.  Remote monitoring is used to monitor your pacemaker from home. This monitoring is scheduled every 91 days by our office. It allows [place] to keep an eye on the functioning of your device to ensure it is working properly. You will routinely see your Electrophysiologist annually (more often if necessary).

## 2021-05-06 NOTE — Progress Notes (Signed)
Wound check appointment. Steri-strips removed. Wound without redness or edema. Incision edges approximated, wound well healed. Normal device function. Thresholds, sensing, and impedances consistent with implant measurements. Device programmed at 3.5V/auto capture programmed on for extra safety margin until 3 month visit. Histogram distribution appropriate for patient and level of activity. AT/AF burden <1%. EGM's appear AT with longest in duration 14 seconds. No high ventricular rates noted. Patient educated about wound care, arm mobility, lifting restrictions. ROV in 3 months with implanting physician.

## 2021-05-07 ENCOUNTER — Telehealth: Payer: Self-pay

## 2021-05-07 ENCOUNTER — Ambulatory Visit: Payer: Medicare Other

## 2021-05-07 NOTE — Telephone Encounter (Signed)
Called husband back who reports that the patient continues to have leg swelling and weeping. Patient has been followed by [facility] cardiology. They have prescribed her a diuretic. I have advised him to contact the patient's cardiologist at [facility] in regards to her continued swelling.

## 2021-05-07 NOTE — Telephone Encounter (Signed)
Patient husband called in and left vm that his wife legs are still swelling and leaking fluid that her pant legs are soaked and he would like a call back since he was told the new medicine would help and it is not

## 2021-05-09 ENCOUNTER — Other Ambulatory Visit: Payer: Self-pay | Admitting: Physical Medicine and Rehabilitation

## 2021-05-09 DIAGNOSIS — N320 Bladder-neck obstruction: Secondary | ICD-10-CM | POA: Diagnosis not present

## 2021-05-09 DIAGNOSIS — S72002D Fracture of unspecified part of neck of left femur, subsequent encounter for closed fracture with routine healing: Secondary | ICD-10-CM | POA: Diagnosis not present

## 2021-05-09 DIAGNOSIS — M810 Age-related osteoporosis without current pathological fracture: Secondary | ICD-10-CM | POA: Diagnosis not present

## 2021-05-09 DIAGNOSIS — Z466 Encounter for fitting and adjustment of urinary device: Secondary | ICD-10-CM | POA: Diagnosis not present

## 2021-05-09 DIAGNOSIS — N323 Diverticulum of bladder: Secondary | ICD-10-CM | POA: Diagnosis not present

## 2021-05-09 DIAGNOSIS — R339 Retention of urine, unspecified: Secondary | ICD-10-CM | POA: Diagnosis not present

## 2021-05-12 DIAGNOSIS — I10 Essential (primary) hypertension: Secondary | ICD-10-CM | POA: Diagnosis not present

## 2021-05-12 DIAGNOSIS — I441 Atrioventricular block, second degree: Secondary | ICD-10-CM | POA: Diagnosis not present

## 2021-05-12 DIAGNOSIS — I2510 Atherosclerotic heart disease of native coronary artery without angina pectoris: Secondary | ICD-10-CM | POA: Diagnosis not present

## 2021-05-12 DIAGNOSIS — I5032 Chronic diastolic (congestive) heart failure: Secondary | ICD-10-CM | POA: Insufficient documentation

## 2021-05-12 DIAGNOSIS — Z950 Presence of cardiac pacemaker: Secondary | ICD-10-CM | POA: Diagnosis not present

## 2021-05-12 DIAGNOSIS — E785 Hyperlipidemia, unspecified: Secondary | ICD-10-CM | POA: Diagnosis not present

## 2021-05-12 NOTE — Telephone Encounter (Signed)
Pt c/o swelling: STAT is pt has developed SOB within 24 hours  If swelling, where is the swelling located? Legs and ankles   How much weight have you gained and in what time span? Hasn't gained weight from the swelling   Have you gained 3 pounds in a day or 5 pounds in a week? Doesn't think so   Do you have a log of your daily weights (if so, list)? No   Are you currently taking a fluid pill? Yes  Are you currently SOB? No   Have you traveled recently? No   Legs are weeping has been elevating legs and it has not helped.  Requesting she be worked in for an appt today.

## 2021-05-12 NOTE — Telephone Encounter (Signed)
Returned call to husband.  Pt has primary cardiologist Dr. Sharrell Ku at [facility].  Per husband, Dr. Leavy Cella office unable to see Pt before a month.  He will call their office again and advise Pt needs to be seen urgently.  If unable to be seen by Dr. Leavy Cella office-will get Pt in with our cardiology.  Advised would call in a few hours.

## 2021-05-12 NOTE — Telephone Encounter (Signed)
Returned call to Pt's husband.  He was able to contact Dr. Leavy Cella office and has appointment for Pt at 1:30 pm today.

## 2021-05-13 DIAGNOSIS — Z681 Body mass index (BMI) 19 or less, adult: Secondary | ICD-10-CM | POA: Diagnosis not present

## 2021-05-13 DIAGNOSIS — L97929 Non-pressure chronic ulcer of unspecified part of left lower leg with unspecified severity: Secondary | ICD-10-CM | POA: Diagnosis not present

## 2021-05-13 DIAGNOSIS — L0390 Cellulitis, unspecified: Secondary | ICD-10-CM | POA: Diagnosis not present

## 2021-05-14 ENCOUNTER — Ambulatory Visit: Payer: Medicare Other | Admitting: Cardiology

## 2021-05-19 IMAGING — CT CT ABD-PELV W/ CM
3 of 6 series · 16 of 46 positions shown, 18 images · IV contrast (omnipaque)
Comparison: CT abdomen pelvis dated January 08, 2005.

CLINICAL DATA: Rectal pain. Concern for fecal impaction.

EXAM:
CT ABDOMEN AND PELVIS WITH CONTRAST
TECHNIQUE: Multidetector CT imaging of the abdomen and pelvis was performed
using the standard protocol following bolus administration of
intravenous contrast.
CONTRAST:  75mL OMNIPAQUE IOHEXOL 300 MG/ML  SOLN

[Series 2: axial st · axial · 0.65mm/px · z∈[+949,+1294]mm · 11 of 83 slices shown, 13 images]
[im 7/83  soft-tissue]
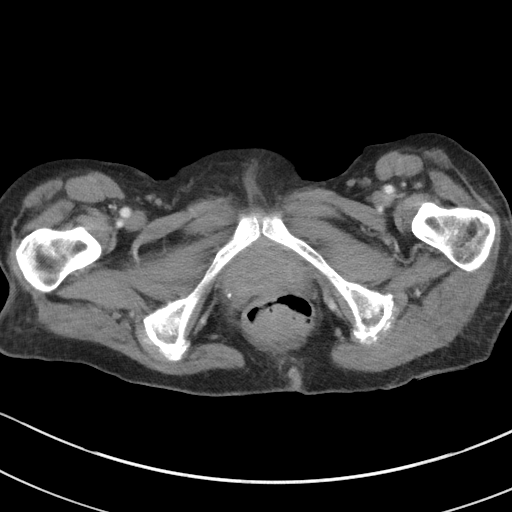
[im 7/83  bone]
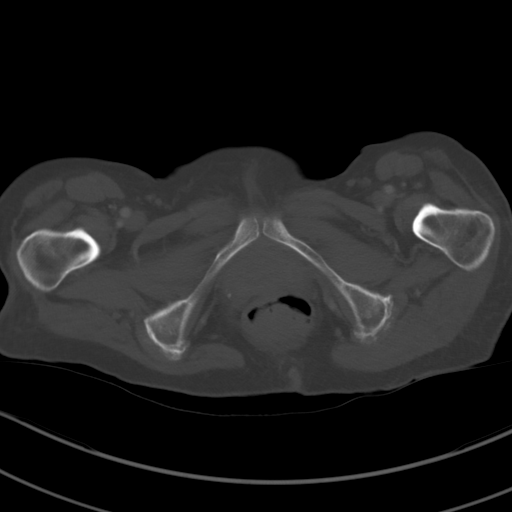
[im 14/83  soft-tissue]
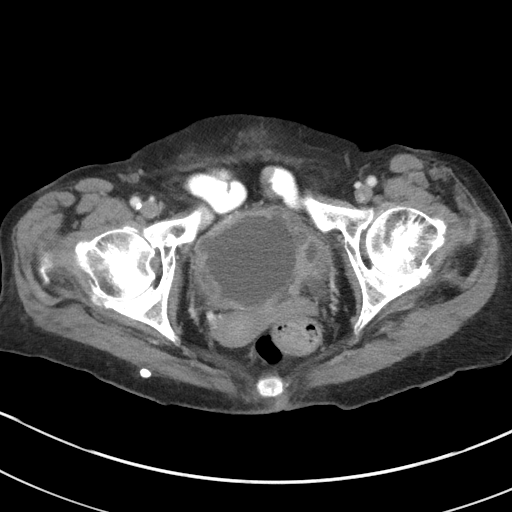
[im 21/83  soft-tissue]
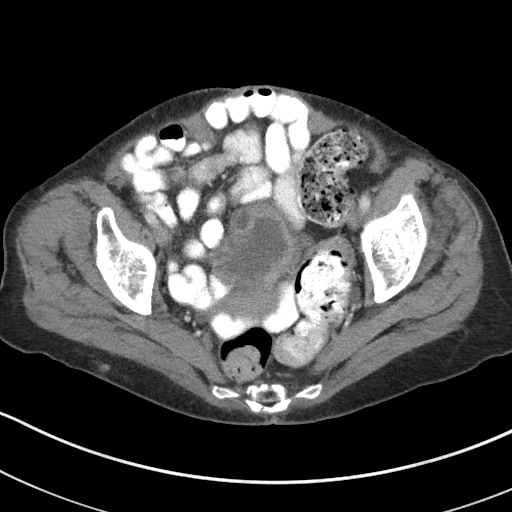
[im 28/83  soft-tissue]
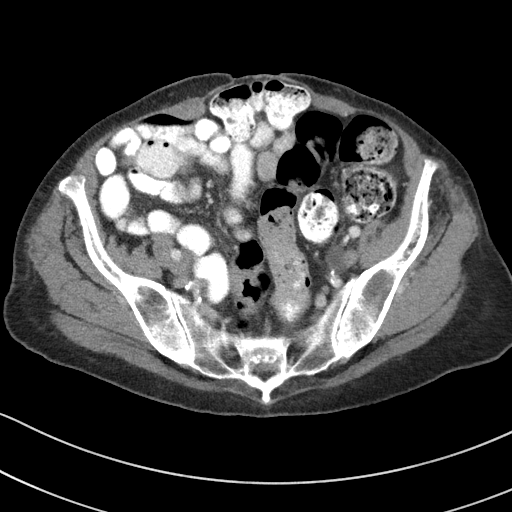
[im 35/83  soft-tissue]
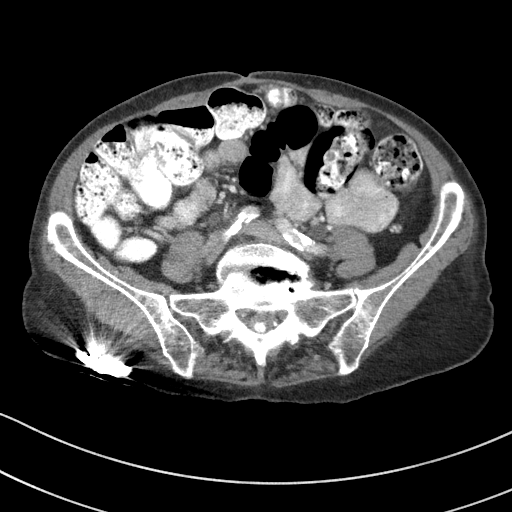
[im 42/83  soft-tissue]
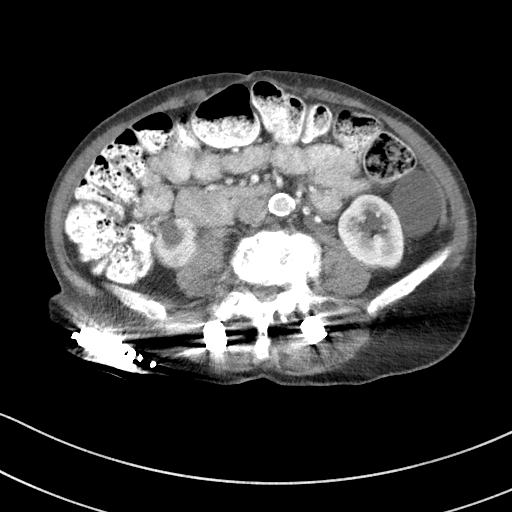
[im 48/83  soft-tissue]
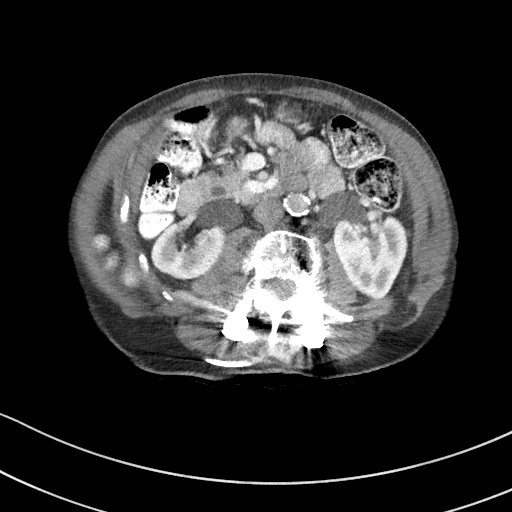
[im 55/83  soft-tissue]
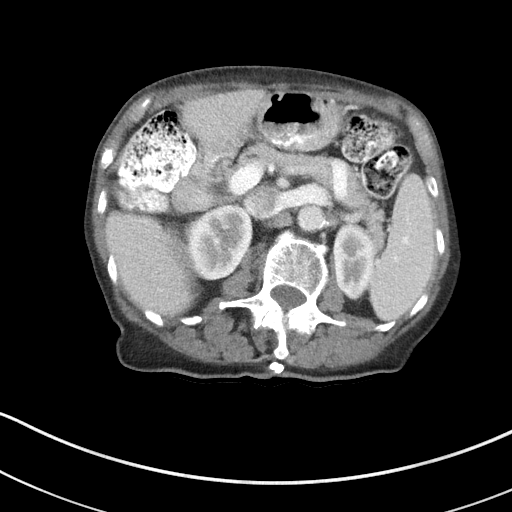
[im 62/83  soft-tissue]
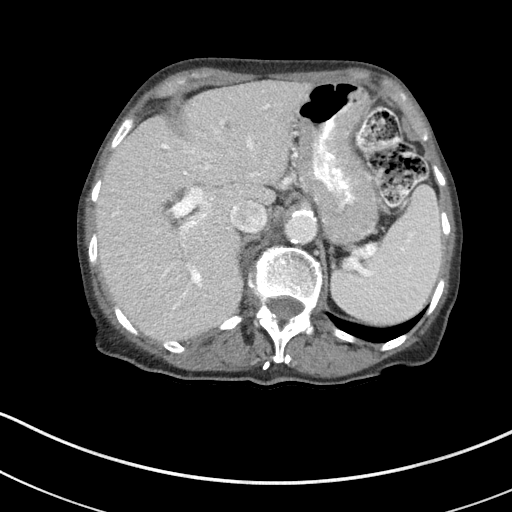
[im 62/83  bone]
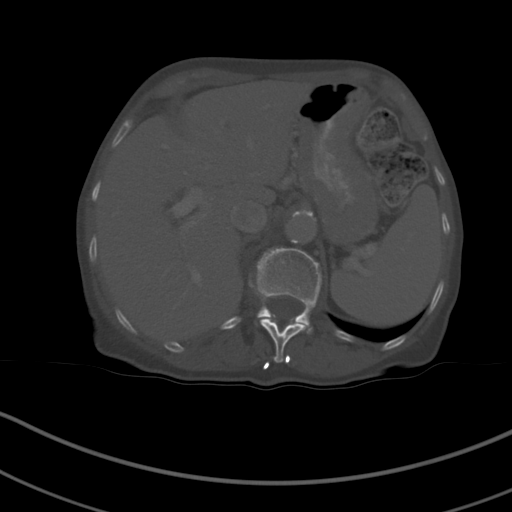
[im 69/83  soft-tissue]
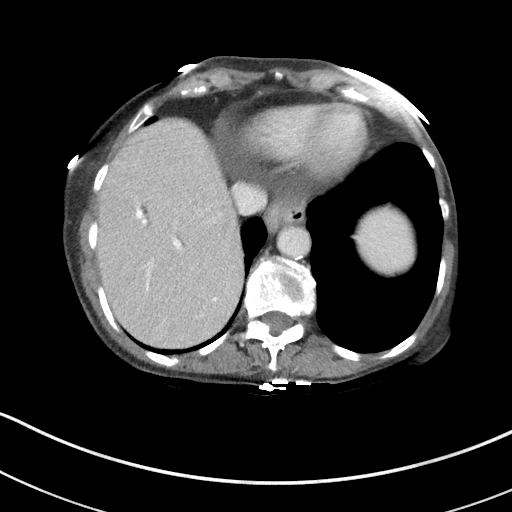
[im 76/83  soft-tissue]
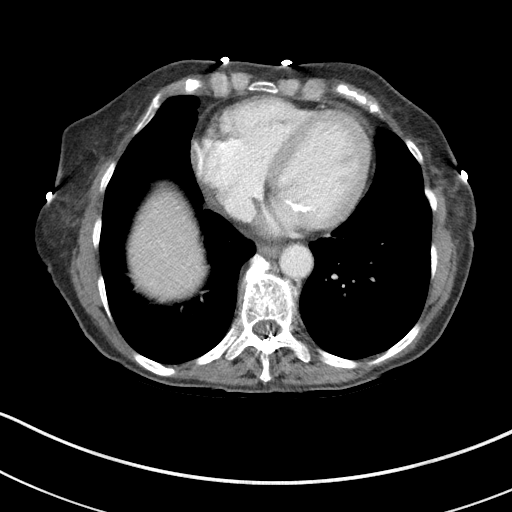

[Series 6: lung bases · axial · 0.65mm/px · z∈[+1181,+1209]mm · 2 of 81 slices shown]
[im 7/81  bone]
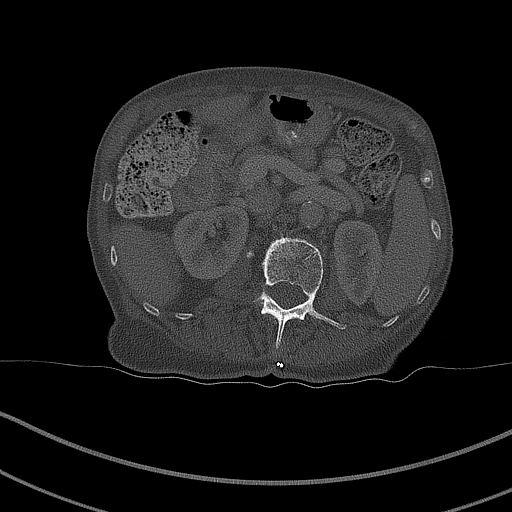
[im 21/81  bone]
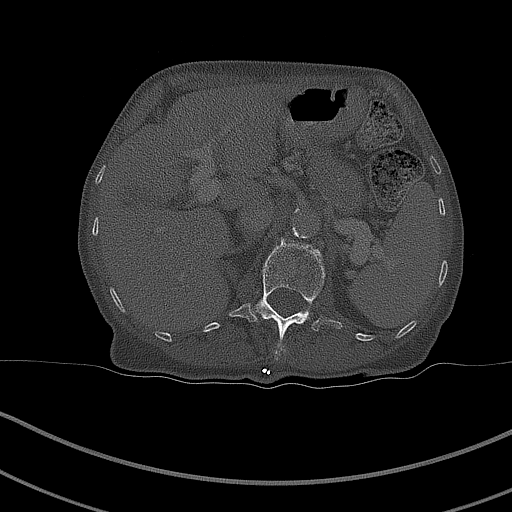

[Series 7: coronal st · coronal · 0.72mm/px · 3 of 89 slices shown]
[im 30/89  soft-tissue]
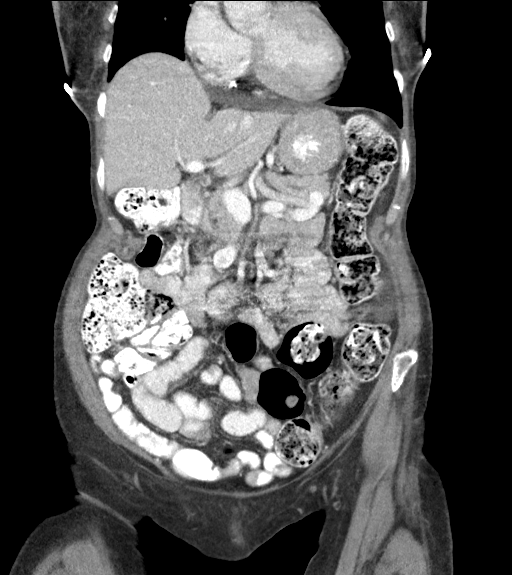
[im 40/89  soft-tissue]
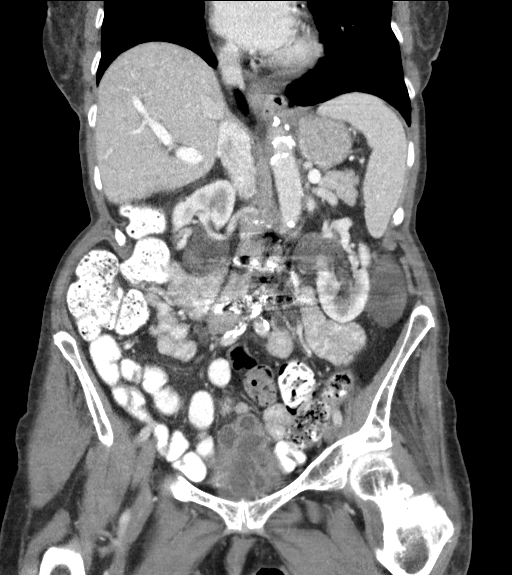
[im 49/89  soft-tissue]
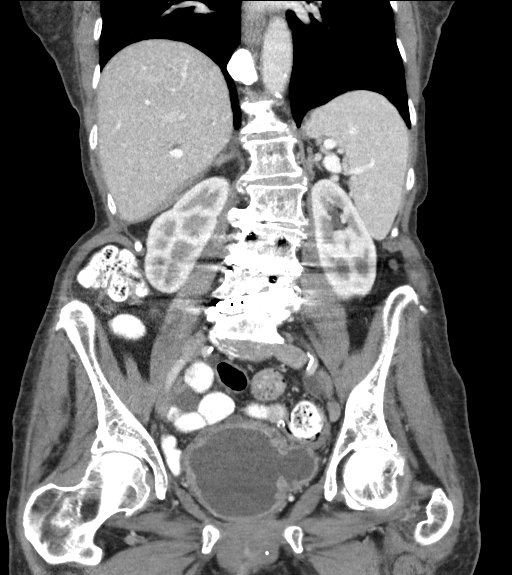

[16 of 46 positions shown; findings below may reference images not displayed]

FINDINGS: Lower chest: No acute abnormality.

Hepatobiliary: No focal liver abnormality. Small gallstone.
Chronically contracted gallbladder with mild wall thickening,
unchanged since 7553. The common bile duct measures up to 8 mm,
within normal limits for the patient's age.

Pancreas: Unremarkable. No pancreatic ductal dilatation or
surrounding inflammatory changes.

Spleen: Normal in size without focal abnormality.

Adrenals/Urinary Tract: Adrenal glands are unremarkable. 1.3 cm
simple cyst in the lower pole of the right kidney. No renal calculi.
Moderate left and mild right hydroureter without hydronephrosis.
Prominently thickened, irregular bladder wall with innumerable
diverticula, progressed since 7553.

Stomach/Bowel: Stomach is within normal limits. Appendix is
surgically absent. No evidence of bowel wall thickening, distention,
or inflammatory changes. Moderate colonic stool burden. No stool
ball in the rectum.

Vascular/Lymphatic: Aortic atherosclerosis. No enlarged abdominal or
pelvic lymph nodes.

Reproductive: Uterus and bilateral adnexa are unremarkable.

Other: Unchanged diastasis of the ventral abdominal wall. No free
fluid or pneumoperitoneum. Slight interval increase in size of a
cm thin walled fluid attenuation lesion in the left abdomen,
previously 5.2 cm, possibly a seroma.

Musculoskeletal: No acute or significant osseous findings. Prior
lumbar fusion.
IMPRESSION: 1. Moderate colonic stool burden. No fecal impaction.
2. Progressed chronic bladder outlet obstruction since 7553 with new
moderate left and mild right hydroureter.
3. Cholelithiasis and chronic gallbladder disease, unchanged.
4. Aortic Atherosclerosis ([id number]).

## 2021-05-21 DIAGNOSIS — K219 Gastro-esophageal reflux disease without esophagitis: Secondary | ICD-10-CM | POA: Diagnosis not present

## 2021-05-21 DIAGNOSIS — E785 Hyperlipidemia, unspecified: Secondary | ICD-10-CM | POA: Diagnosis not present

## 2021-05-21 DIAGNOSIS — M5416 Radiculopathy, lumbar region: Secondary | ICD-10-CM | POA: Diagnosis not present

## 2021-05-21 DIAGNOSIS — K5900 Constipation, unspecified: Secondary | ICD-10-CM | POA: Diagnosis not present

## 2021-05-21 DIAGNOSIS — Z9181 History of falling: Secondary | ICD-10-CM | POA: Diagnosis not present

## 2021-05-21 DIAGNOSIS — N320 Bladder-neck obstruction: Secondary | ICD-10-CM | POA: Diagnosis not present

## 2021-05-21 DIAGNOSIS — M810 Age-related osteoporosis without current pathological fracture: Secondary | ICD-10-CM | POA: Diagnosis not present

## 2021-05-21 DIAGNOSIS — J449 Chronic obstructive pulmonary disease, unspecified: Secondary | ICD-10-CM | POA: Diagnosis not present

## 2021-05-21 DIAGNOSIS — I10 Essential (primary) hypertension: Secondary | ICD-10-CM | POA: Diagnosis not present

## 2021-05-21 DIAGNOSIS — Z466 Encounter for fitting and adjustment of urinary device: Secondary | ICD-10-CM | POA: Diagnosis not present

## 2021-05-21 DIAGNOSIS — S72002D Fracture of unspecified part of neck of left femur, subsequent encounter for closed fracture with routine healing: Secondary | ICD-10-CM | POA: Diagnosis not present

## 2021-05-21 DIAGNOSIS — N323 Diverticulum of bladder: Secondary | ICD-10-CM | POA: Diagnosis not present

## 2021-05-21 DIAGNOSIS — I252 Old myocardial infarction: Secondary | ICD-10-CM | POA: Diagnosis not present

## 2021-05-21 DIAGNOSIS — R339 Retention of urine, unspecified: Secondary | ICD-10-CM | POA: Diagnosis not present

## 2021-05-21 DIAGNOSIS — Z9981 Dependence on supplemental oxygen: Secondary | ICD-10-CM | POA: Diagnosis not present

## 2021-05-24 DIAGNOSIS — R001 Bradycardia, unspecified: Secondary | ICD-10-CM | POA: Diagnosis not present

## 2021-05-24 DIAGNOSIS — I10 Essential (primary) hypertension: Secondary | ICD-10-CM | POA: Diagnosis not present

## 2021-05-24 DIAGNOSIS — G459 Transient cerebral ischemic attack, unspecified: Secondary | ICD-10-CM | POA: Diagnosis not present

## 2021-05-24 DIAGNOSIS — I2510 Atherosclerotic heart disease of native coronary artery without angina pectoris: Secondary | ICD-10-CM | POA: Diagnosis not present

## 2021-06-03 DIAGNOSIS — Z681 Body mass index (BMI) 19 or less, adult: Secondary | ICD-10-CM | POA: Diagnosis not present

## 2021-06-03 DIAGNOSIS — R300 Dysuria: Secondary | ICD-10-CM | POA: Diagnosis not present

## 2021-06-03 DIAGNOSIS — N3001 Acute cystitis with hematuria: Secondary | ICD-10-CM | POA: Diagnosis not present

## 2021-06-03 DIAGNOSIS — K5900 Constipation, unspecified: Secondary | ICD-10-CM | POA: Diagnosis not present

## 2021-06-06 ENCOUNTER — Other Ambulatory Visit: Payer: Self-pay | Admitting: Physical Medicine and Rehabilitation

## 2021-06-06 DIAGNOSIS — R339 Retention of urine, unspecified: Secondary | ICD-10-CM | POA: Diagnosis not present

## 2021-06-06 DIAGNOSIS — N323 Diverticulum of bladder: Secondary | ICD-10-CM | POA: Diagnosis not present

## 2021-06-06 DIAGNOSIS — S72002D Fracture of unspecified part of neck of left femur, subsequent encounter for closed fracture with routine healing: Secondary | ICD-10-CM | POA: Diagnosis not present

## 2021-06-06 DIAGNOSIS — Z466 Encounter for fitting and adjustment of urinary device: Secondary | ICD-10-CM | POA: Diagnosis not present

## 2021-06-06 DIAGNOSIS — N320 Bladder-neck obstruction: Secondary | ICD-10-CM | POA: Diagnosis not present

## 2021-06-06 DIAGNOSIS — M810 Age-related osteoporosis without current pathological fracture: Secondary | ICD-10-CM | POA: Diagnosis not present

## 2021-06-17 DIAGNOSIS — N323 Diverticulum of bladder: Secondary | ICD-10-CM | POA: Diagnosis not present

## 2021-06-17 DIAGNOSIS — N320 Bladder-neck obstruction: Secondary | ICD-10-CM | POA: Diagnosis not present

## 2021-06-17 DIAGNOSIS — R339 Retention of urine, unspecified: Secondary | ICD-10-CM | POA: Diagnosis not present

## 2021-06-17 DIAGNOSIS — S72002D Fracture of unspecified part of neck of left femur, subsequent encounter for closed fracture with routine healing: Secondary | ICD-10-CM | POA: Diagnosis not present

## 2021-06-17 DIAGNOSIS — M810 Age-related osteoporosis without current pathological fracture: Secondary | ICD-10-CM | POA: Diagnosis not present

## 2021-06-17 DIAGNOSIS — Z466 Encounter for fitting and adjustment of urinary device: Secondary | ICD-10-CM | POA: Diagnosis not present

## 2021-06-19 DIAGNOSIS — G894 Chronic pain syndrome: Secondary | ICD-10-CM | POA: Diagnosis not present

## 2021-06-19 DIAGNOSIS — G5600 Carpal tunnel syndrome, unspecified upper limb: Secondary | ICD-10-CM | POA: Diagnosis not present

## 2021-06-19 DIAGNOSIS — Z79899 Other long term (current) drug therapy: Secondary | ICD-10-CM | POA: Diagnosis not present

## 2021-06-19 DIAGNOSIS — Z79891 Long term (current) use of opiate analgesic: Secondary | ICD-10-CM | POA: Diagnosis not present

## 2021-06-19 DIAGNOSIS — M1612 Unilateral primary osteoarthritis, left hip: Secondary | ICD-10-CM | POA: Diagnosis not present

## 2021-06-19 DIAGNOSIS — M5030 Other cervical disc degeneration, unspecified cervical region: Secondary | ICD-10-CM | POA: Diagnosis not present

## 2021-06-19 DIAGNOSIS — M47812 Spondylosis without myelopathy or radiculopathy, cervical region: Secondary | ICD-10-CM | POA: Diagnosis not present

## 2021-06-20 DIAGNOSIS — N320 Bladder-neck obstruction: Secondary | ICD-10-CM | POA: Diagnosis not present

## 2021-06-20 DIAGNOSIS — J449 Chronic obstructive pulmonary disease, unspecified: Secondary | ICD-10-CM | POA: Diagnosis not present

## 2021-06-20 DIAGNOSIS — Z95810 Presence of automatic (implantable) cardiac defibrillator: Secondary | ICD-10-CM | POA: Diagnosis not present

## 2021-06-20 DIAGNOSIS — N323 Diverticulum of bladder: Secondary | ICD-10-CM | POA: Diagnosis not present

## 2021-06-20 DIAGNOSIS — Z9181 History of falling: Secondary | ICD-10-CM | POA: Diagnosis not present

## 2021-06-20 DIAGNOSIS — R339 Retention of urine, unspecified: Secondary | ICD-10-CM | POA: Diagnosis not present

## 2021-06-20 DIAGNOSIS — K219 Gastro-esophageal reflux disease without esophagitis: Secondary | ICD-10-CM | POA: Diagnosis not present

## 2021-06-20 DIAGNOSIS — M810 Age-related osteoporosis without current pathological fracture: Secondary | ICD-10-CM | POA: Diagnosis not present

## 2021-06-20 DIAGNOSIS — E079 Disorder of thyroid, unspecified: Secondary | ICD-10-CM | POA: Diagnosis not present

## 2021-06-20 DIAGNOSIS — Z9981 Dependence on supplemental oxygen: Secondary | ICD-10-CM | POA: Diagnosis not present

## 2021-06-20 DIAGNOSIS — E785 Hyperlipidemia, unspecified: Secondary | ICD-10-CM | POA: Diagnosis not present

## 2021-06-20 DIAGNOSIS — M5416 Radiculopathy, lumbar region: Secondary | ICD-10-CM | POA: Diagnosis not present

## 2021-06-20 DIAGNOSIS — I252 Old myocardial infarction: Secondary | ICD-10-CM | POA: Diagnosis not present

## 2021-06-20 DIAGNOSIS — Z792 Long term (current) use of antibiotics: Secondary | ICD-10-CM | POA: Diagnosis not present

## 2021-06-20 DIAGNOSIS — Z466 Encounter for fitting and adjustment of urinary device: Secondary | ICD-10-CM | POA: Diagnosis not present

## 2021-06-20 DIAGNOSIS — K5900 Constipation, unspecified: Secondary | ICD-10-CM | POA: Diagnosis not present

## 2021-06-20 DIAGNOSIS — S72002D Fracture of unspecified part of neck of left femur, subsequent encounter for closed fracture with routine healing: Secondary | ICD-10-CM | POA: Diagnosis not present

## 2021-06-20 DIAGNOSIS — I10 Essential (primary) hypertension: Secondary | ICD-10-CM | POA: Diagnosis not present

## 2021-07-04 DIAGNOSIS — N320 Bladder-neck obstruction: Secondary | ICD-10-CM | POA: Diagnosis not present

## 2021-07-04 DIAGNOSIS — Z466 Encounter for fitting and adjustment of urinary device: Secondary | ICD-10-CM | POA: Diagnosis not present

## 2021-07-04 DIAGNOSIS — N323 Diverticulum of bladder: Secondary | ICD-10-CM | POA: Diagnosis not present

## 2021-07-04 DIAGNOSIS — R339 Retention of urine, unspecified: Secondary | ICD-10-CM | POA: Diagnosis not present

## 2021-07-04 DIAGNOSIS — M810 Age-related osteoporosis without current pathological fracture: Secondary | ICD-10-CM | POA: Diagnosis not present

## 2021-07-04 DIAGNOSIS — S72002D Fracture of unspecified part of neck of left femur, subsequent encounter for closed fracture with routine healing: Secondary | ICD-10-CM | POA: Diagnosis not present

## 2021-07-20 DIAGNOSIS — Z95810 Presence of automatic (implantable) cardiac defibrillator: Secondary | ICD-10-CM | POA: Diagnosis not present

## 2021-07-20 DIAGNOSIS — Z9181 History of falling: Secondary | ICD-10-CM | POA: Diagnosis not present

## 2021-07-20 DIAGNOSIS — S72002D Fracture of unspecified part of neck of left femur, subsequent encounter for closed fracture with routine healing: Secondary | ICD-10-CM | POA: Diagnosis not present

## 2021-07-20 DIAGNOSIS — N320 Bladder-neck obstruction: Secondary | ICD-10-CM | POA: Diagnosis not present

## 2021-07-20 DIAGNOSIS — N323 Diverticulum of bladder: Secondary | ICD-10-CM | POA: Diagnosis not present

## 2021-07-20 DIAGNOSIS — R339 Retention of urine, unspecified: Secondary | ICD-10-CM | POA: Diagnosis not present

## 2021-07-20 DIAGNOSIS — E079 Disorder of thyroid, unspecified: Secondary | ICD-10-CM | POA: Diagnosis not present

## 2021-07-20 DIAGNOSIS — I252 Old myocardial infarction: Secondary | ICD-10-CM | POA: Diagnosis not present

## 2021-07-20 DIAGNOSIS — Z9981 Dependence on supplemental oxygen: Secondary | ICD-10-CM | POA: Diagnosis not present

## 2021-07-20 DIAGNOSIS — K5900 Constipation, unspecified: Secondary | ICD-10-CM | POA: Diagnosis not present

## 2021-07-20 DIAGNOSIS — E785 Hyperlipidemia, unspecified: Secondary | ICD-10-CM | POA: Diagnosis not present

## 2021-07-20 DIAGNOSIS — M5416 Radiculopathy, lumbar region: Secondary | ICD-10-CM | POA: Diagnosis not present

## 2021-07-20 DIAGNOSIS — J449 Chronic obstructive pulmonary disease, unspecified: Secondary | ICD-10-CM | POA: Diagnosis not present

## 2021-07-20 DIAGNOSIS — M810 Age-related osteoporosis without current pathological fracture: Secondary | ICD-10-CM | POA: Diagnosis not present

## 2021-07-20 DIAGNOSIS — Z466 Encounter for fitting and adjustment of urinary device: Secondary | ICD-10-CM | POA: Diagnosis not present

## 2021-07-20 DIAGNOSIS — I10 Essential (primary) hypertension: Secondary | ICD-10-CM | POA: Diagnosis not present

## 2021-07-20 DIAGNOSIS — K219 Gastro-esophageal reflux disease without esophagitis: Secondary | ICD-10-CM | POA: Diagnosis not present

## 2021-07-20 DIAGNOSIS — Z792 Long term (current) use of antibiotics: Secondary | ICD-10-CM | POA: Diagnosis not present

## 2021-07-23 ENCOUNTER — Ambulatory Visit (INDEPENDENT_AMBULATORY_CARE_PROVIDER_SITE_OTHER): Payer: Medicare Other

## 2021-07-23 DIAGNOSIS — I4430 Unspecified atrioventricular block: Secondary | ICD-10-CM | POA: Diagnosis not present

## 2021-07-23 LAB — CUP PACEART REMOTE DEVICE CHECK
Battery Remaining Longevity: 56 mo
Battery Remaining Percentage: 95.5 %
Battery Voltage: 2.98 V
Brady Statistic AP VP Percent: 18 %
Brady Statistic AP VS Percent: 1 %
Brady Statistic AS VP Percent: 82 %
Brady Statistic AS VS Percent: 1 %
Brady Statistic RA Percent Paced: 17 %
Brady Statistic RV Percent Paced: 99 %
Date Time Interrogation Session: 20230208072343
Implantable Lead Implant Date: 20221109
Implantable Lead Implant Date: 20221109
Implantable Lead Location: 753859
Implantable Lead Location: 753860
Implantable Pulse Generator Implant Date: 20221109
Lead Channel Impedance Value: 350 Ohm
Lead Channel Impedance Value: 430 Ohm
Lead Channel Pacing Threshold Amplitude: 0.75 V
Lead Channel Pacing Threshold Amplitude: 1 V
Lead Channel Pacing Threshold Pulse Width: 0.5 ms
Lead Channel Pacing Threshold Pulse Width: 0.5 ms
Lead Channel Sensing Intrinsic Amplitude: 4.2 mV
Lead Channel Sensing Intrinsic Amplitude: 6.7 mV
Lead Channel Setting Pacing Amplitude: 3.5 V
Lead Channel Setting Pacing Amplitude: 3.5 V
Lead Channel Setting Pacing Pulse Width: 0.5 ms
Lead Channel Setting Sensing Sensitivity: 2 mV
Pulse Gen Model: 2272
Pulse Gen Serial Number: 3956509

## 2021-07-28 NOTE — Progress Notes (Signed)
Remote pacemaker transmission.   

## 2021-07-30 DIAGNOSIS — N323 Diverticulum of bladder: Secondary | ICD-10-CM | POA: Diagnosis not present

## 2021-07-30 DIAGNOSIS — S72002D Fracture of unspecified part of neck of left femur, subsequent encounter for closed fracture with routine healing: Secondary | ICD-10-CM | POA: Diagnosis not present

## 2021-07-30 DIAGNOSIS — N320 Bladder-neck obstruction: Secondary | ICD-10-CM | POA: Diagnosis not present

## 2021-07-30 DIAGNOSIS — Z466 Encounter for fitting and adjustment of urinary device: Secondary | ICD-10-CM | POA: Diagnosis not present

## 2021-07-30 DIAGNOSIS — R339 Retention of urine, unspecified: Secondary | ICD-10-CM | POA: Diagnosis not present

## 2021-07-30 DIAGNOSIS — M810 Age-related osteoporosis without current pathological fracture: Secondary | ICD-10-CM | POA: Diagnosis not present

## 2021-08-02 DIAGNOSIS — M25511 Pain in right shoulder: Secondary | ICD-10-CM | POA: Diagnosis not present

## 2021-08-02 DIAGNOSIS — R1031 Right lower quadrant pain: Secondary | ICD-10-CM | POA: Diagnosis not present

## 2021-08-02 DIAGNOSIS — Z681 Body mass index (BMI) 19 or less, adult: Secondary | ICD-10-CM | POA: Diagnosis not present

## 2021-08-02 DIAGNOSIS — M25551 Pain in right hip: Secondary | ICD-10-CM | POA: Diagnosis not present

## 2021-08-04 DIAGNOSIS — M25511 Pain in right shoulder: Secondary | ICD-10-CM | POA: Diagnosis not present

## 2021-08-04 DIAGNOSIS — M25551 Pain in right hip: Secondary | ICD-10-CM | POA: Diagnosis not present

## 2021-08-05 ENCOUNTER — Telehealth: Payer: Self-pay

## 2021-08-05 NOTE — Telephone Encounter (Signed)
Pt called to verify her appointment with Dr. Curt Bears. I let her know that it is 08/12/2021 at 12:15 pm. The appointment is at the [facility].

## 2021-08-11 DIAGNOSIS — Z79891 Long term (current) use of opiate analgesic: Secondary | ICD-10-CM | POA: Diagnosis not present

## 2021-08-11 DIAGNOSIS — M961 Postlaminectomy syndrome, not elsewhere classified: Secondary | ICD-10-CM | POA: Diagnosis not present

## 2021-08-11 DIAGNOSIS — G894 Chronic pain syndrome: Secondary | ICD-10-CM | POA: Diagnosis not present

## 2021-08-11 DIAGNOSIS — M79605 Pain in left leg: Secondary | ICD-10-CM | POA: Diagnosis not present

## 2021-08-11 DIAGNOSIS — Z9689 Presence of other specified functional implants: Secondary | ICD-10-CM | POA: Diagnosis not present

## 2021-08-11 DIAGNOSIS — M1612 Unilateral primary osteoarthritis, left hip: Secondary | ICD-10-CM | POA: Diagnosis not present

## 2021-08-12 ENCOUNTER — Other Ambulatory Visit: Payer: Self-pay

## 2021-08-12 ENCOUNTER — Ambulatory Visit (INDEPENDENT_AMBULATORY_CARE_PROVIDER_SITE_OTHER): Payer: Medicare Other | Admitting: Cardiology

## 2021-08-12 ENCOUNTER — Encounter: Payer: Self-pay | Admitting: Cardiology

## 2021-08-12 DIAGNOSIS — R001 Bradycardia, unspecified: Secondary | ICD-10-CM | POA: Diagnosis not present

## 2021-08-12 DIAGNOSIS — I441 Atrioventricular block, second degree: Secondary | ICD-10-CM | POA: Diagnosis not present

## 2021-08-12 NOTE — Progress Notes (Signed)
Electrophysiology Office Note   Date:  08/12/2021   ID:  Mckenzie [name], Mckenzie [name] 04-Jul-1935, MRN [id number]  PCP:  Rosalee Kaufman PA-C  Cardiologist:  Dwyane Dee Primary Electrophysiologist:  Terre Zabriskie Meredith Leeds, MD    Chief Complaint: heart block   History of Present Illness: Mckenzie [name] is a 86 y.o. female who is being seen today for the evaluation of heart block at the request of Rosalee Kaufman, *. Presenting today for electrophysiology evaluation.  She has a history significant for coronary artery disease status post LAD stent in 2003, hypertension, hyperlipidemia, diastolic heart failure, COPD, SVT.  She presented to the hospital November 2022 with Mobitz 2 AV block.  Is now status post Abbott dual-chamber pacemaker implanted 04/23/2021.  Today, she denies symptoms of palpitations, chest pain, shortness of breath, orthopnea, PND, lower extremity edema, claudication, dizziness, presyncope, syncope, bleeding, or neurologic sequela. The patient is tolerating medications without difficulties.  Vice was implanted she has done well.  She has had no chest pain or shortness of breath.  She is able to do all of her daily activities and has no concerns.   Past Medical History:  Diagnosis Date   Aortic valve sclerosis    Mild, echo, April, 2012   ARDS (adult respiratory distress syndrome) ([place]) 2003   with tracheostomy-peritonitis   Arthritis    Back pain    Need for surgery, Dr. Glenna Fellows   CAD (coronary artery disease)    Stent to LAD, 2003 [facility] / nuclear scan April, 201 to artifact / persistent shortness of breath / catheterization Oct 21, 2010... widely patent stent to the mid LAD, no significant obstructive disease, should be low risk    for back surgery   Carotid arterial disease ([place])    Moderate-Dr. Trula Slade following   Chronic back pain    COPD (chronic obstructive pulmonary disease) ([place])    Diastolic dysfunction    Echo, April, 2011   EF 65%    Dysrhythmia    afib   Ejection fraction    EF 65%, echo, September 15, 2010   GERD (gastroesophageal reflux disease)    Hemolytic anemia ([place])    Followed by Dr. Janae Sauce   Hypertension    Hypothyroidism    Left ventricular hypertrophy    EF 65%, echo, April, 2011   MR (mitral regurgitation)     Mild echo, 2012 ///  Mild per Echo 2009,  mild, echo, April, 2011   Myocardial infarction [facility])    Palpitations    Holter, September 16, 2010, PACs and PVCs,one 4 beat run of SVT   Pneumonia 11-2011   Preoperative clearance    Preop clearance for back surgery   Shortness of breath    March, 2012   UTI (urinary tract infection)    Chronic-on Macrodantin   Past Surgical History:  Procedure Laterality Date   APPENDECTOMY     BACK SURGERY     BREAST LUMPECTOMY Left    CATARACT EXTRACTION W/PHACO Right 12/05/2012   Procedure: CATARACT EXTRACTION PHACO AND INTRAOCULAR LENS PLACEMENT ([place]);  Surgeon: Tonny Branch, MD;  Location: [facility];  Service: Ophthalmology;  Laterality: Right;  CDE:15.30   CATARACT EXTRACTION W/PHACO Left 12/15/2012   Procedure: CATARACT EXTRACTION PHACO AND INTRAOCULAR LENS PLACEMENT ([facility]);  Surgeon: Tonny Branch, MD;  Location: [facility];  Service: Ophthalmology;  Laterality: Left;  CDE:22.56   CORONARY ANGIOPLASTY WITH STENT PLACEMENT  2003   INTRAMEDULLARY (IM) NAIL INTERTROCHANTERIC Left 09/27/2020   Procedure: INTRAMEDULLARY (  IM) NAIL INTERTROCHANTRIC;  Surgeon: Meredith Pel, MD;  Location: [place];  Service: Orthopedics;  Laterality: Left;  left trochanter   PACEMAKER IMPLANT N/A 04/23/2021   Procedure: PACEMAKER IMPLANT;  Surgeon: Constance Haw, MD;  Location: [place] [facility];  Service: Cardiovascular;  Laterality: N/A;   repair of ruptured lumbar discs  12-2011   SPINE SURGERY     lumbar disk removal by Dr. Francesco Runner at [place]  08/2009   Benign adenomas   TONSILLECTOMY     TRACHEOSTOMY  2003   Following ARDS      Current Outpatient  Medications  Medication Sig Dispense Refill   Calcium Carbonate (CALCIUM 600 PO) Take 1,200 mg by mouth daily.     Cholecalciferol (VITAMIN D3) 1000 UNITS CAPS Take 1,000 Units by mouth daily. Take one tab daily     conjugated estrogens (PREMARIN) vaginal cream Place 1 Applicatorful vaginally daily.     diphenhydrAMINE (BENADRYL) 25 mg capsule Take 25 mg by mouth daily as needed for sleep.     esomeprazole (NEXIUM) 40 MG capsule Take 40 mg by mouth daily before breakfast.     FERREX 150 150 MG capsule Take 150 mg by mouth daily.     HYDROcodone-acetaminophen (NORCO) 10-325 MG tablet Take 1 tablet by mouth every 4 (four) hours as needed for severe pain. 30 tablet 0   levothyroxine (SYNTHROID) 25 MCG tablet TAKE 1 TABLET BY MOUTH DAILY AT 6 AM. 30 tablet 0   Melatonin 10 MG TABS Take 1 tablet by mouth at bedtime.     metoprolol tartrate (LOPRESSOR) 25 MG tablet TAKE (1) TABLET TWICE DAILY. 60 tablet 0   MOVANTIK 25 MG TABS tablet Take 25 mg by mouth daily.     Multiple Vitamin (MULTIVITAMIN) tablet Take 1 tablet by mouth daily.     nitroGLYCERIN (NITROSTAT) 0.4 MG SL tablet Place under the tongue.     polyethylene glycol (MIRALAX / GLYCOLAX) 17 g packet Take 17 g by mouth every morning.     raloxifene (EVISTA) 60 MG tablet Take 60 mg by mouth daily.     tamsulosin (FLOMAX) 0.4 MG CAPS capsule Take 0.4 mg by mouth daily.     temazepam (RESTORIL) 30 MG capsule Take 30 mg by mouth at bedtime.     traZODone (DESYREL) 50 MG tablet Take 50 mg by mouth at bedtime as needed for sleep.     tretinoin (RETIN-A) 0.05 % cream Apply 1 application topically at bedtime.     atorvastatin (LIPITOR) 40 MG tablet Take 1 tablet (40 mg total) by mouth daily. 30 tablet 0   benazepril (LOTENSIN) 20 MG tablet Take 1 tablet (20 mg total) by mouth daily. 30 tablet 0   estrogens, conjugated, (PREMARIN) 0.625 MG tablet Take 0.625 mg by mouth daily. (Patient not taking: Reported on 08/12/2021)     lubiprostone (AMITIZA) 24  MCG capsule Take by mouth. (Patient not taking: Reported on 08/12/2021)     methocarbamol (ROBAXIN) 500 MG tablet Take 1 tablet (500 mg total) by mouth every 6 (six) hours as needed for muscle spasms. (Patient not taking: Reported on 05/04/2021) 45 tablet 0   mometasone-formoterol (DULERA) 100-5 MCG/ACT AERO Inhale 2 puffs into the lungs 2 (two) times daily. (Patient not taking: Reported on 05/04/2021) 1 each 0   traMADol (ULTRAM) 50 MG tablet 1  po q hs prn pain (Patient not taking: Reported on 08/12/2021) 30 tablet 0   No current facility-administered medications  for this visit.    Allergies:   Tetracycline   Social History:  The patient  reports that she quit smoking about 21 years ago. Her smoking use included cigarettes. She has a 20.00 pack-year smoking history. She has never used smokeless tobacco. She reports that she does not drink alcohol and does not use drugs.   Family History:  The patient's family history includes Diabetes in her son; Heart failure in her father.    ROS:  Please see the history of present illness.   Otherwise, review of systems is positive for none.   All other systems are reviewed and negative.    PHYSICAL EXAM: VS:  BP (!) 138/58    Pulse 74    Ht 5\' 2"  (1.575 m)    Wt 95 lb (43.1 kg)    SpO2 96%    BMI 17.38 kg/m  , BMI Body mass index is 17.38 kg/m. GEN: Well nourished, well developed, in no acute distress  HEENT: normal  Neck: no JVD, carotid bruits, or masses Cardiac: RRR; no murmurs, rubs, or gallops,no edema  Respiratory:  clear to auscultation bilaterally, normal work of breathing GI: soft, nontender, nondistended, + BS MS: no deformity or atrophy  Skin: warm and dry, device pocket is well healed Neuro:  Strength and sensation are intact Psych: euthymic mood, full affect  EKG:  EKG is ordered today. Personal review of the ekg ordered shows sinus rhythm, ventricular paced  Device interrogation is reviewed today in detail.  See PaceArt for  details.   Recent Labs: 04/20/2021: TSH 2.440 04/23/2021: Magnesium 2.0 05/04/2021: ALT 19; B Natriuretic Peptide 263.0; BUN 12; Creatinine, Ser 0.60; Hemoglobin 11.5; Platelets 141; Potassium 3.0; Sodium 140    Lipid Panel     Component Value Date/Time   CHOL 137 04/21/2021 0459   TRIG 90 04/21/2021 0459   HDL 63 04/21/2021 0459   CHOLHDL 2.2 04/21/2021 0459   VLDL 18 04/21/2021 0459   LDLCALC 56 04/21/2021 0459     Wt Readings from Last 3 Encounters:  08/12/21 95 lb (43.1 kg)  05/04/21 104 lb 11.5 oz (47.5 kg)  04/24/21 104 lb 11.5 oz (47.5 kg)      Other studies Reviewed: Additional studies/ records that were reviewed today include: TTE 04/21/21  Review of the above records today demonstrates:   1. Left ventricular ejection fraction, by estimation, is 60 to 65%. The  left ventricle has normal function. The left ventricle has no regional  wall motion abnormalities. Left ventricular diastolic parameters are  indeterminate.   2. Right ventricular systolic function is normal. The right ventricular  size is normal. Mildly increased right ventricular wall thickness. There  is mildly elevated pulmonary artery systolic pressure. The estimated right  ventricular systolic pressure is  [id number] mmHg.   3. The mitral valve is degenerative. Moderate mitral valve regurgitation.  The mean mitral valve gradient is 6.0 mmHg with average heart rate of 38  bpm. No pulmonary vein assessment. Diastolic mitral regurgitation seen in  the setting of heart block.   4. Left atrial size was severely dilated.   5. The pericardial effusion is posterior and lateral to the left  ventricle.   6. The aortic valve is tricuspid. There is mild calcification of the  aortic valve. There is mild thickening of the aortic valve. Aortic valve  regurgitation is not visualized. Mild aortic valve stenosis. Aortic valve  mean gradient measures 18.0 mmHg.  Aortic valve Vmax measures 2.91 m/s. DVI 0.87 with  normal LV  stroke volume  index.   7. Diastolic tricuspid regurgitation noted in the setting of heart block.   8. The inferior vena cava is normal in size with greater than 50%  respiratory variability, suggesting right atrial pressure of 3 mmHg.    ASSESSMENT AND PLAN:  1.  2-1 second-degree AV block: Status post Abbott dual-chamber pacemaker implanted 04/23/2021.  Device functioning appropriately.  No changes at this time.  2.  Coronary artery disease: LAD stent in 2003.  No current chest pain.  Continue with current management.  3.  Hypertension: Currently well controlled   Current medicines are reviewed at length with the patient today.   The patient does not have concerns regarding her medicines.  The following changes were made today:  none  Labs/ tests ordered today include:  Orders Placed This Encounter  Procedures   EKG 12-Lead     Disposition:   FU with Marsi Turvey 9 months  Signed, Santrice Muzio Meredith Leeds, MD  08/12/2021 3:11 PM     [address] [place] [address] [phone] (office) [phone] (fax)

## 2021-08-12 NOTE — Patient Instructions (Signed)
Medication Instructions:  Your physician recommends that you continue on your current medications as directed. Please refer to the Current Medication list given to you today.  *If you need a refill on your cardiac medications before your next appointment, please call your pharmacy*   Lab Work: None ordered   Testing/Procedures: None ordered   Follow-Up: At [facility], you and your health needs are our priority.  As part of our continuing mission to provide you with exceptional heart care, we have created designated Provider Care Teams.  These Care Teams include your primary Cardiologist (physician) and Advanced Practice Providers (APPs -  Physician Assistants and Nurse Practitioners) who all work together to provide you with the care you need, when you need it.  Remote monitoring is used to monitor your Pacemaker or ICD from home. This monitoring reduces the number of office visits required to check your device to one time per year. It allows [place] to keep an eye on the functioning of your device to ensure it is working properly. You are scheduled for a device check from home on 08/26/2021. You may send your transmission at any time that day. If you have a wireless device, the transmission will be sent automatically. After your physician reviews your transmission, you will receive a postcard with your next transmission date.  Your next appointment:   9 month(s)  The format for your next appointment:   In Person  Provider:   Loman Brooklyn, MD   Thank you for choosing [facility]!!   Dory Horn, RN [phone]

## 2021-08-13 DIAGNOSIS — S72002D Fracture of unspecified part of neck of left femur, subsequent encounter for closed fracture with routine healing: Secondary | ICD-10-CM | POA: Diagnosis not present

## 2021-08-13 DIAGNOSIS — R339 Retention of urine, unspecified: Secondary | ICD-10-CM | POA: Diagnosis not present

## 2021-08-13 DIAGNOSIS — N323 Diverticulum of bladder: Secondary | ICD-10-CM | POA: Diagnosis not present

## 2021-08-13 DIAGNOSIS — M810 Age-related osteoporosis without current pathological fracture: Secondary | ICD-10-CM | POA: Diagnosis not present

## 2021-08-13 DIAGNOSIS — N320 Bladder-neck obstruction: Secondary | ICD-10-CM | POA: Diagnosis not present

## 2021-08-13 DIAGNOSIS — Z466 Encounter for fitting and adjustment of urinary device: Secondary | ICD-10-CM | POA: Diagnosis not present

## 2021-08-18 DIAGNOSIS — Z466 Encounter for fitting and adjustment of urinary device: Secondary | ICD-10-CM | POA: Diagnosis not present

## 2021-08-18 DIAGNOSIS — M810 Age-related osteoporosis without current pathological fracture: Secondary | ICD-10-CM | POA: Diagnosis not present

## 2021-08-18 DIAGNOSIS — R339 Retention of urine, unspecified: Secondary | ICD-10-CM | POA: Diagnosis not present

## 2021-08-18 DIAGNOSIS — N323 Diverticulum of bladder: Secondary | ICD-10-CM | POA: Diagnosis not present

## 2021-08-18 DIAGNOSIS — S72002D Fracture of unspecified part of neck of left femur, subsequent encounter for closed fracture with routine healing: Secondary | ICD-10-CM | POA: Diagnosis not present

## 2021-08-18 DIAGNOSIS — N320 Bladder-neck obstruction: Secondary | ICD-10-CM | POA: Diagnosis not present

## 2021-08-19 DIAGNOSIS — E079 Disorder of thyroid, unspecified: Secondary | ICD-10-CM | POA: Diagnosis not present

## 2021-08-19 DIAGNOSIS — M810 Age-related osteoporosis without current pathological fracture: Secondary | ICD-10-CM | POA: Diagnosis not present

## 2021-08-19 DIAGNOSIS — K219 Gastro-esophageal reflux disease without esophagitis: Secondary | ICD-10-CM | POA: Diagnosis not present

## 2021-08-19 DIAGNOSIS — E785 Hyperlipidemia, unspecified: Secondary | ICD-10-CM | POA: Diagnosis not present

## 2021-08-19 DIAGNOSIS — Z9981 Dependence on supplemental oxygen: Secondary | ICD-10-CM | POA: Diagnosis not present

## 2021-08-19 DIAGNOSIS — Z792 Long term (current) use of antibiotics: Secondary | ICD-10-CM | POA: Diagnosis not present

## 2021-08-19 DIAGNOSIS — N320 Bladder-neck obstruction: Secondary | ICD-10-CM | POA: Diagnosis not present

## 2021-08-19 DIAGNOSIS — Z466 Encounter for fitting and adjustment of urinary device: Secondary | ICD-10-CM | POA: Diagnosis not present

## 2021-08-19 DIAGNOSIS — M5416 Radiculopathy, lumbar region: Secondary | ICD-10-CM | POA: Diagnosis not present

## 2021-08-19 DIAGNOSIS — K5900 Constipation, unspecified: Secondary | ICD-10-CM | POA: Diagnosis not present

## 2021-08-19 DIAGNOSIS — Z9181 History of falling: Secondary | ICD-10-CM | POA: Diagnosis not present

## 2021-08-19 DIAGNOSIS — J449 Chronic obstructive pulmonary disease, unspecified: Secondary | ICD-10-CM | POA: Diagnosis not present

## 2021-08-19 DIAGNOSIS — R339 Retention of urine, unspecified: Secondary | ICD-10-CM | POA: Diagnosis not present

## 2021-08-19 DIAGNOSIS — Z95810 Presence of automatic (implantable) cardiac defibrillator: Secondary | ICD-10-CM | POA: Diagnosis not present

## 2021-08-19 DIAGNOSIS — N323 Diverticulum of bladder: Secondary | ICD-10-CM | POA: Diagnosis not present

## 2021-08-19 DIAGNOSIS — I252 Old myocardial infarction: Secondary | ICD-10-CM | POA: Diagnosis not present

## 2021-08-19 DIAGNOSIS — I10 Essential (primary) hypertension: Secondary | ICD-10-CM | POA: Diagnosis not present

## 2021-08-26 DIAGNOSIS — N323 Diverticulum of bladder: Secondary | ICD-10-CM | POA: Diagnosis not present

## 2021-08-26 DIAGNOSIS — R339 Retention of urine, unspecified: Secondary | ICD-10-CM | POA: Diagnosis not present

## 2021-08-26 DIAGNOSIS — N320 Bladder-neck obstruction: Secondary | ICD-10-CM | POA: Diagnosis not present

## 2021-08-26 DIAGNOSIS — M810 Age-related osteoporosis without current pathological fracture: Secondary | ICD-10-CM | POA: Diagnosis not present

## 2021-08-26 DIAGNOSIS — M5416 Radiculopathy, lumbar region: Secondary | ICD-10-CM | POA: Diagnosis not present

## 2021-08-26 DIAGNOSIS — Z466 Encounter for fitting and adjustment of urinary device: Secondary | ICD-10-CM | POA: Diagnosis not present

## 2021-09-01 DIAGNOSIS — S7002XA Contusion of left hip, initial encounter: Secondary | ICD-10-CM | POA: Diagnosis not present

## 2021-09-01 DIAGNOSIS — S42002A Fracture of unspecified part of left clavicle, initial encounter for closed fracture: Secondary | ICD-10-CM | POA: Diagnosis not present

## 2021-09-01 DIAGNOSIS — Z681 Body mass index (BMI) 19 or less, adult: Secondary | ICD-10-CM | POA: Diagnosis not present

## 2021-09-02 DIAGNOSIS — S42022A Displaced fracture of shaft of left clavicle, initial encounter for closed fracture: Secondary | ICD-10-CM | POA: Diagnosis not present

## 2021-09-02 DIAGNOSIS — M25512 Pain in left shoulder: Secondary | ICD-10-CM | POA: Diagnosis not present

## 2021-09-08 DIAGNOSIS — K5903 Drug induced constipation: Secondary | ICD-10-CM | POA: Diagnosis not present

## 2021-09-08 DIAGNOSIS — G894 Chronic pain syndrome: Secondary | ICD-10-CM | POA: Diagnosis not present

## 2021-09-08 DIAGNOSIS — Z9689 Presence of other specified functional implants: Secondary | ICD-10-CM | POA: Diagnosis not present

## 2021-09-08 DIAGNOSIS — Z79891 Long term (current) use of opiate analgesic: Secondary | ICD-10-CM | POA: Diagnosis not present

## 2021-09-08 DIAGNOSIS — M1612 Unilateral primary osteoarthritis, left hip: Secondary | ICD-10-CM | POA: Diagnosis not present

## 2021-09-11 DIAGNOSIS — M5416 Radiculopathy, lumbar region: Secondary | ICD-10-CM | POA: Diagnosis not present

## 2021-09-11 DIAGNOSIS — R339 Retention of urine, unspecified: Secondary | ICD-10-CM | POA: Diagnosis not present

## 2021-09-11 DIAGNOSIS — M810 Age-related osteoporosis without current pathological fracture: Secondary | ICD-10-CM | POA: Diagnosis not present

## 2021-09-11 DIAGNOSIS — N320 Bladder-neck obstruction: Secondary | ICD-10-CM | POA: Diagnosis not present

## 2021-09-11 DIAGNOSIS — Z466 Encounter for fitting and adjustment of urinary device: Secondary | ICD-10-CM | POA: Diagnosis not present

## 2021-09-11 DIAGNOSIS — N323 Diverticulum of bladder: Secondary | ICD-10-CM | POA: Diagnosis not present

## 2021-09-15 DIAGNOSIS — S42022A Displaced fracture of shaft of left clavicle, initial encounter for closed fracture: Secondary | ICD-10-CM | POA: Diagnosis not present

## 2021-09-18 DIAGNOSIS — Z792 Long term (current) use of antibiotics: Secondary | ICD-10-CM | POA: Diagnosis not present

## 2021-09-18 DIAGNOSIS — K5900 Constipation, unspecified: Secondary | ICD-10-CM | POA: Diagnosis not present

## 2021-09-18 DIAGNOSIS — R339 Retention of urine, unspecified: Secondary | ICD-10-CM | POA: Diagnosis not present

## 2021-09-18 DIAGNOSIS — K219 Gastro-esophageal reflux disease without esophagitis: Secondary | ICD-10-CM | POA: Diagnosis not present

## 2021-09-18 DIAGNOSIS — Z466 Encounter for fitting and adjustment of urinary device: Secondary | ICD-10-CM | POA: Diagnosis not present

## 2021-09-18 DIAGNOSIS — I252 Old myocardial infarction: Secondary | ICD-10-CM | POA: Diagnosis not present

## 2021-09-18 DIAGNOSIS — Z9181 History of falling: Secondary | ICD-10-CM | POA: Diagnosis not present

## 2021-09-18 DIAGNOSIS — M810 Age-related osteoporosis without current pathological fracture: Secondary | ICD-10-CM | POA: Diagnosis not present

## 2021-09-18 DIAGNOSIS — E079 Disorder of thyroid, unspecified: Secondary | ICD-10-CM | POA: Diagnosis not present

## 2021-09-18 DIAGNOSIS — N320 Bladder-neck obstruction: Secondary | ICD-10-CM | POA: Diagnosis not present

## 2021-09-18 DIAGNOSIS — J449 Chronic obstructive pulmonary disease, unspecified: Secondary | ICD-10-CM | POA: Diagnosis not present

## 2021-09-18 DIAGNOSIS — N323 Diverticulum of bladder: Secondary | ICD-10-CM | POA: Diagnosis not present

## 2021-09-18 DIAGNOSIS — M5416 Radiculopathy, lumbar region: Secondary | ICD-10-CM | POA: Diagnosis not present

## 2021-09-18 DIAGNOSIS — I10 Essential (primary) hypertension: Secondary | ICD-10-CM | POA: Diagnosis not present

## 2021-09-18 DIAGNOSIS — E785 Hyperlipidemia, unspecified: Secondary | ICD-10-CM | POA: Diagnosis not present

## 2021-09-18 DIAGNOSIS — Z95810 Presence of automatic (implantable) cardiac defibrillator: Secondary | ICD-10-CM | POA: Diagnosis not present

## 2021-09-18 DIAGNOSIS — Z9981 Dependence on supplemental oxygen: Secondary | ICD-10-CM | POA: Diagnosis not present

## 2021-09-23 DIAGNOSIS — R339 Retention of urine, unspecified: Secondary | ICD-10-CM | POA: Diagnosis not present

## 2021-09-23 DIAGNOSIS — M5416 Radiculopathy, lumbar region: Secondary | ICD-10-CM | POA: Diagnosis not present

## 2021-09-23 DIAGNOSIS — M810 Age-related osteoporosis without current pathological fracture: Secondary | ICD-10-CM | POA: Diagnosis not present

## 2021-09-23 DIAGNOSIS — Z466 Encounter for fitting and adjustment of urinary device: Secondary | ICD-10-CM | POA: Diagnosis not present

## 2021-09-23 DIAGNOSIS — N323 Diverticulum of bladder: Secondary | ICD-10-CM | POA: Diagnosis not present

## 2021-09-23 DIAGNOSIS — N320 Bladder-neck obstruction: Secondary | ICD-10-CM | POA: Diagnosis not present

## 2021-09-24 DIAGNOSIS — S42022A Displaced fracture of shaft of left clavicle, initial encounter for closed fracture: Secondary | ICD-10-CM | POA: Diagnosis not present

## 2021-10-02 DIAGNOSIS — Z20822 Contact with and (suspected) exposure to covid-19: Secondary | ICD-10-CM | POA: Diagnosis not present

## 2021-10-05 DIAGNOSIS — I10 Essential (primary) hypertension: Secondary | ICD-10-CM | POA: Diagnosis not present

## 2021-10-05 DIAGNOSIS — R001 Bradycardia, unspecified: Secondary | ICD-10-CM | POA: Diagnosis not present

## 2021-10-06 ENCOUNTER — Emergency Department (HOSPITAL_COMMUNITY): Payer: Medicare Other

## 2021-10-06 ENCOUNTER — Other Ambulatory Visit (HOSPITAL_COMMUNITY): Payer: Medicare Other

## 2021-10-06 ENCOUNTER — Other Ambulatory Visit: Payer: Self-pay

## 2021-10-06 ENCOUNTER — Observation Stay (HOSPITAL_COMMUNITY): Payer: Medicare Other

## 2021-10-06 ENCOUNTER — Encounter (HOSPITAL_COMMUNITY): Payer: Self-pay

## 2021-10-06 ENCOUNTER — Observation Stay (HOSPITAL_COMMUNITY)
Admission: EM | Admit: 2021-10-06 | Discharge: 2021-10-06 | Disposition: A | Discharge status: Home Health | Payer: Medicare Other | Attending: Internal Medicine | Admitting: Internal Medicine

## 2021-10-06 ENCOUNTER — Observation Stay (HOSPITAL_BASED_OUTPATIENT_CLINIC_OR_DEPARTMENT_OTHER): Payer: Medicare Other

## 2021-10-06 ENCOUNTER — Ambulatory Visit (HOSPITAL_COMMUNITY): Admission: RE | Admit: 2021-10-06 | Payer: Medicare Other | Source: Ambulatory Visit

## 2021-10-06 DIAGNOSIS — S81802A Unspecified open wound, left lower leg, initial encounter: Secondary | ICD-10-CM | POA: Diagnosis not present

## 2021-10-06 DIAGNOSIS — R29818 Other symptoms and signs involving the nervous system: Secondary | ICD-10-CM | POA: Insufficient documentation

## 2021-10-06 DIAGNOSIS — E46 Unspecified protein-calorie malnutrition: Secondary | ICD-10-CM

## 2021-10-06 DIAGNOSIS — E8809 Other disorders of plasma-protein metabolism, not elsewhere classified: Secondary | ICD-10-CM | POA: Diagnosis not present

## 2021-10-06 DIAGNOSIS — I6389 Other cerebral infarction: Secondary | ICD-10-CM | POA: Diagnosis not present

## 2021-10-06 DIAGNOSIS — R296 Repeated falls: Secondary | ICD-10-CM | POA: Insufficient documentation

## 2021-10-06 DIAGNOSIS — K219 Gastro-esophageal reflux disease without esophagitis: Secondary | ICD-10-CM

## 2021-10-06 DIAGNOSIS — Z20822 Contact with and (suspected) exposure to covid-19: Secondary | ICD-10-CM | POA: Diagnosis not present

## 2021-10-06 DIAGNOSIS — I639 Cerebral infarction, unspecified: Secondary | ICD-10-CM

## 2021-10-06 DIAGNOSIS — I10 Essential (primary) hypertension: Secondary | ICD-10-CM | POA: Diagnosis not present

## 2021-10-06 DIAGNOSIS — I252 Old myocardial infarction: Secondary | ICD-10-CM | POA: Insufficient documentation

## 2021-10-06 DIAGNOSIS — I2510 Atherosclerotic heart disease of native coronary artery without angina pectoris: Secondary | ICD-10-CM | POA: Diagnosis not present

## 2021-10-06 DIAGNOSIS — X58XXXA Exposure to other specified factors, initial encounter: Secondary | ICD-10-CM | POA: Insufficient documentation

## 2021-10-06 DIAGNOSIS — E782 Mixed hyperlipidemia: Secondary | ICD-10-CM | POA: Diagnosis not present

## 2021-10-06 DIAGNOSIS — R918 Other nonspecific abnormal finding of lung field: Secondary | ICD-10-CM | POA: Insufficient documentation

## 2021-10-06 DIAGNOSIS — M79605 Pain in left leg: Secondary | ICD-10-CM | POA: Diagnosis not present

## 2021-10-06 DIAGNOSIS — E039 Hypothyroidism, unspecified: Secondary | ICD-10-CM | POA: Diagnosis not present

## 2021-10-06 DIAGNOSIS — R209 Unspecified disturbances of skin sensation: Secondary | ICD-10-CM

## 2021-10-06 DIAGNOSIS — F1120 Opioid dependence, uncomplicated: Secondary | ICD-10-CM | POA: Insufficient documentation

## 2021-10-06 DIAGNOSIS — R202 Paresthesia of skin: Principal | ICD-10-CM | POA: Insufficient documentation

## 2021-10-06 DIAGNOSIS — Z950 Presence of cardiac pacemaker: Secondary | ICD-10-CM | POA: Diagnosis not present

## 2021-10-06 DIAGNOSIS — J449 Chronic obstructive pulmonary disease, unspecified: Secondary | ICD-10-CM | POA: Insufficient documentation

## 2021-10-06 DIAGNOSIS — I517 Cardiomegaly: Secondary | ICD-10-CM | POA: Diagnosis not present

## 2021-10-06 DIAGNOSIS — R8281 Pyuria: Secondary | ICD-10-CM

## 2021-10-06 DIAGNOSIS — I672 Cerebral atherosclerosis: Secondary | ICD-10-CM | POA: Diagnosis not present

## 2021-10-06 DIAGNOSIS — Z79899 Other long term (current) drug therapy: Secondary | ICD-10-CM | POA: Insufficient documentation

## 2021-10-06 DIAGNOSIS — R9431 Abnormal electrocardiogram [ECG] [EKG]: Secondary | ICD-10-CM | POA: Diagnosis not present

## 2021-10-06 DIAGNOSIS — Z87891 Personal history of nicotine dependence: Secondary | ICD-10-CM | POA: Diagnosis not present

## 2021-10-06 DIAGNOSIS — Z955 Presence of coronary angioplasty implant and graft: Secondary | ICD-10-CM | POA: Insufficient documentation

## 2021-10-06 LAB — I-STAT CHEM 8, ED
BUN: 22 mg/dL (ref 8–23)
Calcium, Ion: 1.06 mmol/L — ABNORMAL LOW (ref 1.15–1.40)
Chloride: 103 mmol/L (ref 98–111)
Creatinine, Ser: 0.5 mg/dL (ref 0.44–1.00)
Glucose, Bld: 117 mg/dL — ABNORMAL HIGH (ref 70–99)
HCT: 35 % — ABNORMAL LOW (ref 36.0–46.0)
Hemoglobin: 11.9 g/dL — ABNORMAL LOW (ref 12.0–15.0)
Potassium: 3.8 mmol/L (ref 3.5–5.1)
Sodium: 138 mmol/L (ref 135–145)
TCO2: 26 mmol/L (ref 22–32)

## 2021-10-06 LAB — COMPREHENSIVE METABOLIC PANEL
ALT: 19 U/L (ref 0–44)
AST: 24 U/L (ref 15–41)
Albumin: 3.3 g/dL — ABNORMAL LOW (ref 3.5–5.0)
Alkaline Phosphatase: 98 U/L (ref 38–126)
Anion gap: 8 (ref 5–15)
BUN: 24 mg/dL — ABNORMAL HIGH (ref 8–23)
CO2: 26 mmol/L (ref 22–32)
Calcium: 8.4 mg/dL — ABNORMAL LOW (ref 8.9–10.3)
Chloride: 105 mmol/L (ref 98–111)
Creatinine, Ser: 0.5 mg/dL (ref 0.44–1.00)
GFR, Estimated: >60 mL/min (ref >60)
Glucose, Bld: 120 mg/dL — ABNORMAL HIGH (ref 70–99)
Potassium: 3.8 mmol/L (ref 3.5–5.1)
Sodium: 139 mmol/L (ref 135–145)
Total Bilirubin: 0.6 mg/dL (ref 0.3–1.2)
Total Protein: 6.7 g/dL (ref 6.5–8.1)

## 2021-10-06 LAB — CBG MONITORING, ED: Glucose-Capillary: 121 mg/dL — ABNORMAL HIGH (ref 70–99)

## 2021-10-06 LAB — ECHOCARDIOGRAM COMPLETE
AR max vel: 1.48 cm2
AV Area VTI: 1.44 cm2
AV Area mean vel: 1.35 cm2
AV Mean grad: 19 mmHg
AV Peak grad: 36 mmHg
Ao pk vel: 3 m/s
Area-P 1/2: 6.07 cm2
Calc EF: 62.6 %
Height: 63 in
MV VTI: 3.53 cm2
S' Lateral: 2.7 cm
Single Plane A2C EF: 60.9 %
Single Plane A4C EF: 65.4 %
Weight: 1452.8 oz

## 2021-10-06 LAB — DIFFERENTIAL
Abs Immature Granulocytes: 0.02 10*3/uL (ref 0.00–0.07)
Basophils Absolute: 0 10*3/uL (ref 0.0–0.1)
Basophils Relative: 0 %
Eosinophils Absolute: 0 10*3/uL (ref 0.0–0.5)
Eosinophils Relative: 1 %
Immature Granulocytes: 0 %
Lymphocytes Relative: 9 %
Lymphs Abs: 0.8 10*3/uL (ref 0.7–4.0)
Monocytes Absolute: 0.7 10*3/uL (ref 0.1–1.0)
Monocytes Relative: 8 %
Neutro Abs: 6.8 10*3/uL (ref 1.7–7.7)
Neutrophils Relative %: 82 %

## 2021-10-06 LAB — LIPID PANEL
Cholesterol: 126 mg/dL (ref 0–200)
HDL: 60 mg/dL (ref >40)
LDL Cholesterol: 52 mg/dL (ref 0–99)
Total CHOL/HDL Ratio: 2.1 RATIO
Triglycerides: 70 mg/dL (ref <150)
VLDL: 14 mg/dL (ref 0–40)

## 2021-10-06 LAB — RESP PANEL BY RT-PCR (FLU A&B, COVID) ARPGX2
Influenza A by PCR: NEGATIVE
Influenza B by PCR: NEGATIVE
SARS Coronavirus 2 by RT PCR: NEGATIVE

## 2021-10-06 LAB — CBC
HCT: 34.5 % — ABNORMAL LOW (ref 36.0–46.0)
Hemoglobin: 11.2 g/dL — ABNORMAL LOW (ref 12.0–15.0)
MCH: 29.8 pg (ref 26.0–34.0)
MCHC: 32.5 g/dL (ref 30.0–36.0)
MCV: 91.8 fL (ref 80.0–100.0)
Platelets: 174 10*3/uL (ref 150–400)
RBC: 3.76 MIL/uL — ABNORMAL LOW (ref 3.87–5.11)
RDW: 14.6 % (ref 11.5–15.5)
WBC: 8.3 10*3/uL (ref 4.0–10.5)
nRBC: 0 % (ref 0.0–0.2)

## 2021-10-06 LAB — PROTIME-INR
INR: 1 (ref 0.8–1.2)
Prothrombin Time: 13.6 seconds (ref 11.4–15.2)

## 2021-10-06 LAB — APTT: aPTT: 31 seconds (ref 24–36)

## 2021-10-06 LAB — URINALYSIS, ROUTINE W REFLEX MICROSCOPIC
Bilirubin Urine: NEGATIVE
Glucose, UA: NEGATIVE mg/dL
Ketones, ur: NEGATIVE mg/dL
Nitrite: NEGATIVE
Protein, ur: 30 mg/dL — AB
Specific Gravity, Urine: 1.028 (ref 1.005–1.030)
WBC, UA: >50 WBC/hpf — ABNORMAL HIGH (ref 0–5)
pH: 8 (ref 5.0–8.0)

## 2021-10-06 LAB — RAPID URINE DRUG SCREEN, HOSP PERFORMED
Amphetamines: NOT DETECTED
Barbiturates: NOT DETECTED
Benzodiazepines: POSITIVE — AB
Cocaine: NOT DETECTED
Opiates: POSITIVE — AB
Tetrahydrocannabinol: NOT DETECTED

## 2021-10-06 LAB — MAGNESIUM: Magnesium: 1.7 mg/dL (ref 1.7–2.4)

## 2021-10-06 LAB — HEMOGLOBIN A1C
Hgb A1c MFr Bld: 4.9 % (ref 4.8–5.6)
Mean Plasma Glucose: 93.93 mg/dL

## 2021-10-06 LAB — ETHANOL: Alcohol, Ethyl (B): <10 mg/dL (ref <10)

## 2021-10-06 LAB — PROCALCITONIN: Procalcitonin: 0.26 ng/mL

## 2021-10-06 LAB — PHOSPHORUS: Phosphorus: 3.3 mg/dL (ref 2.5–4.6)

## 2021-10-06 MED ORDER — IOHEXOL 350 MG/ML SOLN
75.0000 mL | Freq: Once | INTRAVENOUS | Status: AC | PRN
  Administered 2021-10-06: 75 mL via INTRAVENOUS

## 2021-10-06 MED ORDER — STROKE: EARLY STAGES OF RECOVERY BOOK
Freq: Once | Status: AC
  Filled 2021-10-06: qty 1

## 2021-10-06 MED ORDER — ATORVASTATIN CALCIUM 40 MG PO TABS
40.0000 mg | ORAL_TABLET | Freq: Every day | ORAL | Status: DC
  Administered 2021-10-06: 40 mg via ORAL
  Filled 2021-10-06: qty 1

## 2021-10-06 MED ORDER — LEVOTHYROXINE SODIUM 25 MCG PO TABS
25.0000 ug | ORAL_TABLET | Freq: Every day | ORAL | Status: DC
  Administered 2021-10-06: 25 ug via ORAL
  Filled 2021-10-06: qty 1

## 2021-10-06 MED ORDER — CLOPIDOGREL BISULFATE 75 MG PO TABS
75.0000 mg | ORAL_TABLET | Freq: Every day | ORAL | Status: DC
  Administered 2021-10-06: 75 mg via ORAL
  Filled 2021-10-06: qty 1

## 2021-10-06 MED ORDER — ACETAMINOPHEN 325 MG PO TABS
650.0000 mg | ORAL_TABLET | Freq: Four times a day (QID) | ORAL | Status: DC | PRN
Start: 2021-10-06 — End: 2021-10-06
  Administered 2021-10-06 (×2): 650 mg via ORAL
  Filled 2021-10-06 (×2): qty 2

## 2021-10-06 MED ORDER — ENSURE ENLIVE PO LIQD: 237.0000 mL | Freq: Two times a day (BID) | ORAL | Status: DC

## 2021-10-06 MED ORDER — MEDIHONEY WOUND/BURN DRESSING EX PSTE
1.0000 | PASTE | Freq: Every day | CUTANEOUS | Status: DC
Start: 2021-10-06 — End: 2021-10-06
  Administered 2021-10-06: 1 via TOPICAL
  Filled 2021-10-06: qty 44

## 2021-10-06 MED ORDER — ATORVASTATIN CALCIUM 40 MG PO TABS: 40.0000 mg | ORAL_TABLET | Freq: Every day | ORAL | 1 refills | Status: AC

## 2021-10-06 MED ORDER — PANTOPRAZOLE SODIUM 40 MG PO TBEC
80.0000 mg | DELAYED_RELEASE_TABLET | Freq: Every day | ORAL | Status: DC
Start: 2021-10-06 — End: 2021-10-06
  Administered 2021-10-06: 80 mg via ORAL
  Filled 2021-10-06: qty 2

## 2021-10-06 MED ORDER — MEDIHONEY WOUND/BURN DRESSING EX PSTE: 1.0000 "application " | PASTE | Freq: Every day | CUTANEOUS | Status: AC

## 2021-10-06 MED ORDER — CLOPIDOGREL BISULFATE 75 MG PO TABS
75.0000 mg | ORAL_TABLET | Freq: Every day | ORAL | 0 refills | Status: AC
Start: 2021-10-07 — End: ?

## 2021-10-06 MED ORDER — ASPIRIN 81 MG PO TBEC: 81.0000 mg | DELAYED_RELEASE_TABLET | Freq: Every day | ORAL | 11 refills | Status: AC

## 2021-10-06 MED ORDER — ASPIRIN EC 81 MG PO TBEC
81.0000 mg | DELAYED_RELEASE_TABLET | Freq: Every day | ORAL | Status: DC
  Administered 2021-10-06: 81 mg via ORAL
  Filled 2021-10-06: qty 1

## 2021-10-06 NOTE — Hospital Course (Signed)
86 year old female with history of hypertension, hyperlipidemia, coronary artery disease with mid LAD stent 2003, hypothyroidism, Mobitz 2 AV block status post PPM 04/23/2021, and left iIT hip fracture April 2022 presenting with numbness and tingling of her left arm and left leg that began around 10:30 PM on 10/05/2021.  The patient was watching television at the time.  She denied any loss of vision, dysarthria, facial droop, or weakness in her arms or legs.  Notably, the patient had a TIA back in November 2022.  MRI of the brain was negative for acute findings at that time, the patient was instructed to take aspirin 81 mg daily.  However, the patient states that she has not been taking her aspirin daily.  It is unclear how long ago she stopped.  She endorses compliance with her other medications. ?She states that she had been in her usual state of health prior to the development of her numbness and tingling.  She denies any fevers, chills, headache, chest pain, shortness of breath, coughing, hemoptysis, nausea, vomiting or diarrhea, abdominal pain.  She has a chronic indwelling Foley catheter.  It was last changed about a 1 month prior to this admission.  In the ED, the patient was afebrile and hemodynamically stable with blood pressure up to 200/69.  Oxygen saturation was 99% on room air.  BMP showed sodium 139, potassium 3.8, bicarbonate 26, creatinine 0.50.  LFTs were unremarkable.  WBC 8.3, hemoglobin 11.2, platelets 174,000.  The patient was started on aspirin and admitted for further evaluation and treatment. ?

## 2021-10-06 NOTE — Consult Note (Signed)
I connected with  Mckenzie [name] on 10/06/21 by a video enabled telemedicine application and verified that I am speaking with the correct person using two identifiers. ?  ?I discussed the limitations of evaluation and management by telemedicine. The patient expressed understanding and agreed to proceed. ? ?Location of patient: [facility] ?Location of physician: [facility] ? ? ?Neurology Consultation ?Reason for Consult: Left upper extremity paresthesias ?Referring Physician: Dr Shanon Brow Tat ? ?CC: Left upper extremity paresthesias ? ?History is obtained from: Patient, chart review ? ?HPI: Mckenzie [name] is a 86 y.o. female with past medical history of hypertension, hyperlipidemia, coronary artery disease status post stent, hypothyroidism who presented to the emergency room for sudden onset left-sided tingling.  Patient states she was sitting watching TV when suddenly her left arm and leg became tingly/paresthesias (denies feeling numb), no tingling on face, tingling sensation has persisted till now.  Patient denies having similar sensations in the past, does not take any antiplatelets.  Upon arrival, blood pressure was 200/69 ? ?Last known normal 10/05/2021 at 2200 ?Event happened at home ?No tPA as NIHSS 1 with mildly disabling symptoms only ?No thrombectomy as no large vessel occlusion ?mRS 0 ? ? ?ROS: All other systems reviewed and negative except as noted in the HPI.  ? ?Past Medical History:  ?Diagnosis Date  ? Aortic valve sclerosis   ? Mild, echo, April, 2012  ? ARDS (adult respiratory distress syndrome) ([place]) 2003  ? with tracheostomy-peritonitis  ? Arthritis   ? Back pain   ? Need for surgery, Dr. Glenna Fellows  ? CAD (coronary artery disease)   ? Stent to LAD, 2003 [facility] / nuclear scan April, 201 to artifact / persistent shortness of breath / catheterization Oct 21, 2010... widely patent stent to the mid LAD, no significant obstructive disease, should be low risk    for back surgery  ? Carotid  arterial disease ([place])   ? Moderate-Dr. Trula Slade following  ? Chronic back pain   ? COPD (chronic obstructive pulmonary disease) ([place])   ? Diastolic dysfunction   ? Echo, April, 2011   EF 65%  ? Dysrhythmia   ? afib  ? Ejection fraction   ? EF 65%, echo, September 15, 2010  ? GERD (gastroesophageal reflux disease)   ? Hemolytic anemia (HCC)   ? Followed by Dr. Janae Sauce  ? Hypertension   ? Hypothyroidism   ? Left ventricular hypertrophy   ? EF 65%, echo, April, 2011  ? MR (mitral regurgitation)   ?  Mild echo, 2012 ///  Mild per Echo 2009,  mild, echo, April, 2011  ? Myocardial infarction [facility])   ? Palpitations   ? Holter, September 16, 2010, PACs and PVCs,one 4 beat run of SVT  ? Pneumonia 11-2011  ? Preoperative clearance   ? Preop clearance for back surgery  ? Shortness of breath   ? March, 2012  ? UTI (urinary tract infection)   ? Chronic-on Macrodantin  ? ? ?Family History  ?Problem Relation Age of Onset  ? Heart failure Father   ? Diabetes Son   ? ?Social History:  reports that she quit smoking about 21 years ago. Her smoking use included cigarettes. She has a 20.00 pack-year smoking history. She has never used smokeless tobacco. She reports that she does not drink alcohol and does not use drugs. ? ?Medications Prior to Admission  ?Medication Sig Dispense Refill Last Dose  ? atorvastatin (LIPITOR) 40 MG tablet Take 1 tablet (40  mg total) by mouth daily. 30 tablet 0   ? benazepril (LOTENSIN) 20 MG tablet Take 1 tablet (20 mg total) by mouth daily. 30 tablet 0   ? Calcium Carbonate (CALCIUM 600 PO) Take 1,200 mg by mouth daily.     ? Cholecalciferol (VITAMIN D3) 1000 UNITS CAPS Take 1,000 Units by mouth daily. Take one tab daily     ? conjugated estrogens (PREMARIN) vaginal cream Place 1 Applicatorful vaginally daily.     ? diphenhydrAMINE (BENADRYL) 25 mg capsule Take 25 mg by mouth daily as needed for sleep.     ? esomeprazole (NEXIUM) 40 MG capsule Take 40 mg by mouth daily before breakfast.     ? estrogens, conjugated,  (PREMARIN) 0.625 MG tablet Take 0.625 mg by mouth daily. (Patient not taking: Reported on 08/12/2021)     ? FERREX 150 150 MG capsule Take 150 mg by mouth daily.     ? HYDROcodone-acetaminophen (NORCO) 10-325 MG tablet Take 1 tablet by mouth every 4 (four) hours as needed for severe pain. 30 tablet 0   ? levothyroxine (SYNTHROID) 25 MCG tablet TAKE 1 TABLET BY MOUTH DAILY AT 6 AM. 30 tablet 0   ? Melatonin 10 MG TABS Take 1 tablet by mouth at bedtime.     ? methocarbamol (ROBAXIN) 500 MG tablet Take 1 tablet (500 mg total) by mouth every 6 (six) hours as needed for muscle spasms. (Patient not taking: Reported on 05/04/2021) 45 tablet 0 Not Taking  ? metoprolol tartrate (LOPRESSOR) 25 MG tablet TAKE (1) TABLET TWICE DAILY. 60 tablet 0   ? mometasone-formoterol (DULERA) 100-5 MCG/ACT AERO Inhale 2 puffs into the lungs 2 (two) times daily. (Patient not taking: Reported on 05/04/2021) 1 each 0 Not Taking  ? MOVANTIK 25 MG TABS tablet Take 25 mg by mouth daily.     ? Multiple Vitamin (MULTIVITAMIN) tablet Take 1 tablet by mouth daily.     ? nitroGLYCERIN (NITROSTAT) 0.4 MG SL tablet Place under the tongue.     ? polyethylene glycol (MIRALAX / GLYCOLAX) 17 g packet Take 17 g by mouth every morning.     ? raloxifene (EVISTA) 60 MG tablet Take 60 mg by mouth daily.     ? tamsulosin (FLOMAX) 0.4 MG CAPS capsule Take 0.4 mg by mouth daily.     ? temazepam (RESTORIL) 30 MG capsule Take 30 mg by mouth at bedtime.     ? traMADol (ULTRAM) 50 MG tablet 1  po q hs prn pain (Patient not taking: Reported on 08/12/2021) 30 tablet 0 Completed Course  ? traZODone (DESYREL) 50 MG tablet Take 50 mg by mouth at bedtime as needed for sleep.     ? tretinoin (RETIN-A) 0.05 % cream Apply 1 application topically at bedtime.     ?  ? ? ?Exam: ?Current vital signs: ?BP (!) 162/73 (BP Location: Left Arm)   Pulse 98   Temp 98.1 ?F (36.7 ?C) (Oral)   Resp 18   Ht 5\' 3"  (1.6 m)   Wt 41.2 kg   SpO2 98%   BMI 16.08 kg/m?  ?Vital signs in last 24  hours: ?Temp:  [98.1 ?F (36.7 ?C)-98.5 ?F (36.9 ?C)] 98.1 ?F (36.7 ?C) (04/24 0850) ?Pulse Rate:  [60-100] 98 (04/24 0651) ?Resp:  [17-25] 18 (04/24 [id number]) ?BP: (141-200)/(69-97) 162/73 (04/24 0850) ?SpO2:  [94 %-99 %] 98 % (04/24 0850) ?Weight:  [41.2 kg-43.1 kg] 41.2 kg (04/24 0321) ? ? ?Physical Exam  ?Constitutional: Appears well-developed and well-nourished.  ?  Psych: Affect appropriate to situation ?Eyes: No scleral injection ?Neuro: AOx3, cranial nerves II 12 appear grossly intact, antigravity strength in upper extremities left wrist, paresthesias in left upper and lower extremity ?NIHSS 1 ? ?I have reviewed labs in epic and the results pertinent to this consultation are: ?CBC:  ?Recent Labs  ?Lab 10/06/21 ?0050 10/06/21 ?[id number]  ?WBC 8.3  --   ?NEUTROABS 6.8  --   ?HGB 11.2* 11.9*  ?HCT 34.5* 35.0*  ?MCV 91.8  --   ?PLT 174  --   ? ? ?Basic Metabolic Panel:  ?Lab Results  ?Component Value Date  ? NA 138 10/06/2021  ? K 3.8 10/06/2021  ? CO2 26 10/06/2021  ? GLUCOSE 117 (H) 10/06/2021  ? BUN 22 10/06/2021  ? CREATININE 0.50 10/06/2021  ? CALCIUM 8.4 (L) 10/06/2021  ? GFRNONAA >60 10/06/2021  ? GFRAA >60 05/09/2018  ? ?Lipid Panel:  ?Lab Results  ?Component Value Date  ? [place] 52 10/06/2021  ? ?HgbA1c:  ?Lab Results  ?Component Value Date  ? HGBA1C 5.3 04/21/2021  ? ?Urine Drug Screen:  ?   ?Component Value Date/Time  ? LABOPIA POSITIVE (A) 10/06/2021 0046  ? [place] DETECTED 10/06/2021 0046  ? LABBENZ POSITIVE (A) 10/06/2021 0046  ? AMPHETMU NONE DETECTED 10/06/2021 0046  ? [place] DETECTED 10/06/2021 0046  ? [place] DETECTED 10/06/2021 0046  ?  ?Alcohol Level  ?   ?Component Value Date/Time  ? ETH <10 10/06/2021 0050  ? ? ? ?I have reviewed the images obtained: ? ?CT head without contrast 10/06/2021: No acute abnormality ?CT angio head and neck with contrast 10/06/2021: No emergent large vessel occlusion or hemodynamically significant stenosis of the intracranial or cervical  arteries. ? ?ASSESSMENT/PLAN: 86 year old female who presented with left upper extremity paresthesias. ? ?Acute ischemic stroke ?Hypertension ?Hyperlipidemia ?Coronary artery disease status post stenting ?-Etiology of stroke: L

## 2021-10-06 NOTE — Assessment & Plan Note (Signed)
PDMP reviewed ?Patient receives Norco 10/325, #150 monthly ?--last refill 09/08/21 ?Patient receives temazepam 30 mg, #30 monthly ?--last refill 09/22/21 ?

## 2021-10-06 NOTE — Assessment & Plan Note (Addendum)
Concerned the patient likely had a small stroke ?She continues to have dysesthesias of her left upper and left lower extremity at the time of my evaluation ?Unable to obtain MRI brain secondary to PPM ?CT brain negative ?CTAH&N--negative for significant intracranial stenosis.  No hemodynamically significant stenosis of the carotids ?PT evaluation>>HHPT ?LDL 52 ?Check A1c--4.9 ?Echo--EF 60-65% no WMA, no PFO ?Continue aspirin 81 mg daily, add plavix 75 mg x 21 days, then ASA monotherapy thereafter ?Continue Lipitor 40 mg daily ?Neurology consult>>likely posterior circulation acute ischemic stroke ?I sent message to electrophysiology to request PPM interrogation for any afib/aflutter after d/c ?

## 2021-10-06 NOTE — Assessment & Plan Note (Signed)
No chest pain presently ?Continue aspirin and statin ?Personally reviewed EKG--V paced rhythm ?

## 2021-10-06 NOTE — H&P (Addendum)
?History and Physical  ? ? ?Patient: JAX ABDELRAHMAN [id number] DOB: [birth year] ?DOA: 10/06/2021 ?DOS: the patient was seen and examined on 10/06/2021 ?PCP: Royann Shivers, PA-C  ?Patient coming from: Home ? ?Chief Complaint:  ?Chief Complaint  ?Patient presents with  ? Tingling  ? ?HPI: SHALICIA CRAGHEAD is a 86 y.o. female with medical history significant of hyperlipidemia, GERD, hypertension, hypothyroidism, CAD s/p stent placement who presents to the emergency department due to sudden onset of tingling sensation in the left arm and left leg about 2 hours prior to arrival to the ED.  Last well-known time was 10:30 PM yesterday.  Patient complained of pins-and-needles sensation in left arm and leg which started while watching TV at home.  It was associated with some mild sensory deficits as well as some left leg weakness.  She denies fever, chills, chest pain, shortness of breath, headache, speech or vision changes. ? ? ?ED Course: ?In the emergency department, BP was 200/69, HR 93 bpm and other vital signs were within normal range.  Work-up in the ED showed normocytic anemia, normal BMP except for BUN of 24 and CBG of 120, albumin 3.3, alcohol level was less than 10. ?CT angiogram of head and neck showed no emergent large vessel occlusion or hemodynamically significant stenosis of the intracranial or cervical arteries. Comminuted fracture of the left clavicle, likely chronic given ?the surrounding periosteal new bone formation, but new since 04/22/2021. Patchy multifocal bilateral upper lobe opacity, concerning for infection. ?Teleneurology was consulted and recommended further stroke work-up ? ? ?Review of Systems: ?Review of systems as noted in the HPI. All other systems reviewed and are negative. ? ? ?Past Medical History:  ?Diagnosis Date  ? Aortic valve sclerosis   ? Mild, echo, April, 2012  ? ARDS (adult respiratory distress syndrome) ([facility]) 2003  ? with tracheostomy-peritonitis  ? Arthritis   ?  Back pain   ? Need for surgery, Dr. Trey Sailors  ? CAD (coronary artery disease)   ? Stent to LAD, 2003 [facility] / nuclear scan April, 201 to artifact / persistent shortness of breath / catheterization Oct 21, 2010... widely patent stent to the mid LAD, no significant obstructive disease, should be low risk    for back surgery  ? Carotid arterial disease (HCC)   ? Moderate-Dr. Myra Gianotti following  ? Chronic back pain   ? COPD (chronic obstructive pulmonary disease) (HCC)   ? Diastolic dysfunction   ? Echo, April, 2011   EF 65%  ? Dysrhythmia   ? afib  ? Ejection fraction   ? EF 65%, echo, September 15, 2010  ? GERD (gastroesophageal reflux disease)   ? Hemolytic anemia (HCC)   ? Followed by Dr. Derald Macleod  ? Hypertension   ? Hypothyroidism   ? Left ventricular hypertrophy   ? EF 65%, echo, April, 2011  ? MR (mitral regurgitation)   ?  Mild echo, 2012 ///  Mild per Echo 2009,  mild, echo, April, 2011  ? Myocardial infarction [facility])   ? Palpitations   ? Holter, September 16, 2010, PACs and PVCs,one 4 beat run of SVT  ? Pneumonia 11-2011  ? Preoperative clearance   ? Preop clearance for back surgery  ? Shortness of breath   ? March, 2012  ? UTI (urinary tract infection)   ? Chronic-on Macrodantin  ? ?Past Surgical History:  ?Procedure Laterality Date  ? APPENDECTOMY    ? BACK SURGERY    ? BREAST LUMPECTOMY Left   ?  CATARACT EXTRACTION W/PHACO Right 12/05/2012  ? Procedure: CATARACT EXTRACTION PHACO AND INTRAOCULAR LENS PLACEMENT (IOC);  Surgeon: Gemma Payor, MD;  Location: [facility];  Service: Ophthalmology;  Laterality: Right;  CDE:15.30  ? CATARACT EXTRACTION W/PHACO Left 12/15/2012  ? Procedure: CATARACT EXTRACTION PHACO AND INTRAOCULAR LENS PLACEMENT (IOC);  Surgeon: Gemma Payor, MD;  Location: [facility];  Service: Ophthalmology;  Laterality: Left;  CDE:22.56  ? CORONARY ANGIOPLASTY WITH STENT PLACEMENT  2003  ? INTRAMEDULLARY (IM) NAIL INTERTROCHANTERIC Left 09/27/2020  ? Procedure: INTRAMEDULLARY (IM) NAIL INTERTROCHANTRIC;  Surgeon: Cammy Copa, MD;  Location: [facility] OR;  Service: Orthopedics;  Laterality: Left;  left trochanter  ? PACEMAKER IMPLANT N/A 04/23/2021  ? Procedure: PACEMAKER IMPLANT;  Surgeon: Regan Lemming, MD;  Location: [facility] INVASIVE CV LAB;  Service: Cardiovascular;  Laterality: N/A;  ? repair of ruptured lumbar discs  12-2011  ? SPINE SURGERY    ? lumbar disk removal by Dr. Laurian Brim at [facility]  ? THYROID SURGERY  08/2009  ? Benign adenomas  ? TONSILLECTOMY    ? TRACHEOSTOMY  2003  ? Following ARDS   ? ? ?Social History:  reports that she quit smoking about 21 years ago. Her smoking use included cigarettes. She has a 20.00 pack-year smoking history. She has never used smokeless tobacco. She reports that she does not drink alcohol and does not use drugs. ? ? ?Allergies  ?Allergen Reactions  ? Tetracycline Nausea Only  ? ? ?Family History  ?Problem Relation Age of Onset  ? Heart failure Father   ? Diabetes Son   ?  ? ?Prior to Admission medications   ?Medication Sig Start Date End Date Taking? Authorizing Provider  ?atorvastatin (LIPITOR) 40 MG tablet Take 1 tablet (40 mg total) by mouth daily. 04/25/21 05/25/21  Hughie Closs, MD  ?benazepril (LOTENSIN) 20 MG tablet Take 1 tablet (20 mg total) by mouth daily. 04/25/21 05/25/21  Hughie Closs, MD  ?Calcium Carbonate (CALCIUM 600 PO) Take 1,200 mg by mouth daily.    [provider]  ?Cholecalciferol (VITAMIN D3) 1000 UNITS CAPS Take 1,000 Units by mouth daily. Take one tab daily    [provider]  ?conjugated estrogens (PREMARIN) vaginal cream Place 1 Applicatorful vaginally daily.    [provider]  ?diphenhydrAMINE (BENADRYL) 25 mg capsule Take 25 mg by mouth daily as needed for sleep.    [provider]  ?esomeprazole (NEXIUM) 40 MG capsule Take 40 mg by mouth daily before breakfast.    [provider]  ?estrogens, conjugated, (PREMARIN) 0.625 MG tablet Take 0.625 mg by mouth daily. ?Patient not taking: Reported on 08/12/2021     [provider]  ?FERREX 150 150 MG capsule Take 150 mg by mouth daily. 04/25/21   [provider]  ?HYDROcodone-acetaminophen (NORCO) 10-325 MG tablet Take 1 tablet by mouth every 4 (four) hours as needed for severe pain. 10/11/20   Love, Evlyn Kanner, PA-C  ?levothyroxine (SYNTHROID) 25 MCG tablet TAKE 1 TABLET BY MOUTH DAILY AT 6 AM. 06/10/21   Raulkar, Drema Pry, MD  ?lubiprostone (AMITIZA) 24 MCG capsule Take by mouth. ?Patient not taking: Reported on 08/12/2021 03/03/21   [provider]  ?Melatonin 10 MG TABS Take 1 tablet by mouth at bedtime.    [provider]  ?methocarbamol (ROBAXIN) 500 MG tablet Take 1 tablet (500 mg total) by mouth every 6 (six) hours as needed for muscle spasms. ?Patient not taking: Reported on 05/04/2021 10/11/20   Jacquelynn Cree, PA-C  ?  metoprolol tartrate (LOPRESSOR) 25 MG tablet TAKE (1) TABLET TWICE DAILY. 10/12/20   Raulkar, Drema PryKrutika P, MD  ?mometasone-formoterol (DULERA) 100-5 MCG/ACT AERO Inhale 2 puffs into the lungs 2 (two) times daily. ?Patient not taking: Reported on 05/04/2021 10/11/20   Jacquelynn CreeLove, Pamela S, PA-C  ?MOVANTIK 25 MG TABS tablet Take 25 mg by mouth daily. 09/16/20   [provider]  ?Multiple Vitamin (MULTIVITAMIN) tablet Take 1 tablet by mouth daily.    [provider]  ?nitroGLYCERIN (NITROSTAT) 0.4 MG SL tablet Place under the tongue.    [provider]  ?polyethylene glycol (MIRALAX / GLYCOLAX) 17 g packet Take 17 g by mouth every morning.    [provider]  ?raloxifene (EVISTA) 60 MG tablet Take 60 mg by mouth daily. 04/13/19   [provider]  ?tamsulosin (FLOMAX) 0.4 MG CAPS capsule Take 0.4 mg by mouth daily. 04/06/19   [provider]  ?temazepam (RESTORIL) 30 MG capsule Take 30 mg by mouth at bedtime.    [provider]  ?traMADol (ULTRAM) 50 MG tablet 1  po q hs prn pain ?Patient not taking: Reported on 08/12/2021 01/20/21   Cammy Copaean, Gregory Scott, MD  ?traZODone (DESYREL)  50 MG tablet Take 50 mg by mouth at bedtime as needed for sleep. 06/28/19   [provider]  ?tretinoin (RETIN-A) 0.05 % cream Apply 1 application topically at bedtime.    Provider, Historical,

## 2021-10-06 NOTE — Consult Note (Signed)
[facility] ?[facility] Tele[facility] Consult [facility] ? ? ?Patient Name:   Mckenzie [name], Mckenzie [name] ?Date of Birth:   01-02-1936 ?Identification Number:   MRN - [id number] ?Date of Service:   10/06/2021 01:09:46 ? ?Diagnosis: ?      R20.2 - Paresthesia of skin ? ?Impression: ?     86 y/o woman with history of HLD presenting with tingling of the left upper extremity. Not a thrombolytic candidate as the symptoms are non disabling. Unclear whether symptoms related to a small stroke or perhaps hypertensive urgency. Would admit for stroke workup with MRI brain and start on ASA. ? ?Our recommendations are outlined below. ? ?Recommendations: ? ?      Stroke/Telemetry Floor ?      Neuro Checks ?      Bedside Swallow Eval ?      DVT Prophylaxis ?      IV Fluids, Normal Saline ?      Head of Bed 30 Degrees ?      Euglycemia and Avoid Hyperthermia (PRN Acetaminophen) ?      Initiate or continue Aspirin 81 MG daily ?      Antihypertensives PRN if Blood pressure is greater than 220/120 or there is a concern for End organ damage/contraindications for permissive HTN. If blood pressure is greater than 220/120 give labetalol PO or IV or Vasotec IV with a goal of 15% reduction in BP during the first 24 hours. ? ?Per facility request will defer further work up, management, and referrals to inpatient service, inclusive of inpatient neurology consult. ? ?Sign Out: ?      Discussed with Emergency Department Provider ? ? ? ?------------------------------------------------------------------------------ ? ?Advanced Imaging: ?CTA Head and Neck Completed. ? ?LVO:No ? ?Patient doesn't meet criteria for emergent NIR consideration ? ? ?Metrics: ?Last Known Well: 10/05/2021 22:30:00 ?[facility] Notification Time: 10/06/2021 01:09:46 ?Arrival Time: 10/06/2021 00:10:00 ?Stamp Time: 10/06/2021 01:09:46 ?Initial Response Time: 10/06/2021 01:10:28 ?Symptoms: tingling. ?NIHSS Start Assessment Time: 10/06/2021 01:12:53 ?Patient is not a candidate  for Thrombolytic. ?Thrombolytic Medical Decision: 10/06/2021 01:16:15 ?Patient was not deemed candidate for Thrombolytic because of following reasons: ?No disabling symptoms. ? ?CT head showed no acute hemorrhage or acute core infarct. ? ?Primary Provider Notified of Diagnostic Impression and Management Plan on: 10/06/2021 01:18:02 ? ? ? ?------------------------------------------------------------------------------ ? ?History of Present Illness: ?Patient is a 86 year old Female. ? ?Patient was brought by private transportation with symptoms of tingling. ?86 y/o woman with history of hyperlipidemia presenting with left sided tingling. States that she was watching television last evening at 2230, when she began to feel tingling in her left arm which eventually spread to her left leg. She denies any weakness. She denies any speech impediment. ? ? ? ?Past Medical History: ?     Hyperlipidemia ? ?Medications: ? ?No Anticoagulant use  ?No Antiplatelet use ?Reviewed EMR for current medications ? ?Allergies:  ?Reviewed ? ?Social History: ?Drug Use: No ? ?Family History: ? ?There is no family history of premature cerebrovascular disease pertinent to this consultation ? ?ROS : ?14 Points Review of Systems was performed and was negative except mentioned in HPI. ? ?Past Surgical History: ?There Is No Surgical History Contributory To Today?s Visit ? ? ? ?Examination: ?BP(200/69), Pulse(92), ?1A: Level of Consciousness - Alert; keenly responsive + 0 ?1B: Ask Month and Age - Both Questions Right + 0 ?1C: Blink Eyes & Squeeze Hands - Performs Both Tasks + 0 ?2: Test Horizontal Extraocular Movements - Normal + 0 ?3: Test Visual Fields - No  Visual Loss + 0 ?4: Test Facial Palsy (Use Grimace if Obtunded) - Normal symmetry + 0 ?5A: Test Left Arm Motor Drift - No Drift for 10 Seconds + 0 ?5B: Test Right Arm Motor Drift - No Drift for 10 Seconds + 0 ?6A: Test Left Leg Motor Drift - No Drift for 5 Seconds + 0 ?6B: Test Right Leg Motor  Drift - No Drift for 5 Seconds + 0 ?7: Test Limb Ataxia (FNF/Heel-Shin) - No Ataxia + 0 ?8: Test Sensation - Normal; No sensory loss + 0 ?9: Test Language/Aphasia - Normal; No aphasia + 0 ?10: Test Dysarthria - Normal + 0 ?11: Test Extinction/Inattention - No abnormality + 0 ? ?NIHSS Score: 0 ? ? ?Pre-Morbid Modified Rankin Scale: ?3 Points = Moderate disability; requiring some help, but able to walk without assistance ? ? ?Patient/Family was informed the Neurology Consult would occur via TeleHealth consult by way of interactive audio and video telecommunications and consented to receiving care in this manner. ? ? ?Patient is being evaluated for possible acute neurologic impairment and high probability of imminent or life-threatening deterioration. I spent total of 20 minutes providing care to this patient, including time for face to face visit via telemedicine, review of medical records, imaging studies and discussion of findings with providers, the patient and/or family. ? ? ?Dr Shari Prows ? ? ?[facility] ?[phone] ? ? ?Case [id number] ? ?

## 2021-10-06 NOTE — Assessment & Plan Note (Addendum)
Patient has indwelling Foley catheter ?UA>50 WBC ?Send urine culture ?Defer antibiotics for now as the patient does not have mental status change, and she is afebrile without any dysuria or abdominal pain ?Changed Foley catheter 4/24 ?

## 2021-10-06 NOTE — Progress Notes (Signed)
0045 code stroke activated ?0050 down to ct ?0109 back from ct ?0111 Wei on camera ? ?mrs 3- walker ?Ncct results given to Wei at 0100 and advanced imagaging given to wei at [phone] ?

## 2021-10-06 NOTE — Assessment & Plan Note (Signed)
LDL 52 ?Continue statin ?

## 2021-10-06 NOTE — Assessment & Plan Note (Addendum)
Hold PPI x 21 days while taking plavix ?Gave patient and spouse instructions to hold PPI ?

## 2021-10-06 NOTE — Assessment & Plan Note (Signed)
Patient uses a walker ?PT evaluation ?She has a chronic appearing left clavicle fracture ?Patient endorses a fall onto her left side although does not remember exactly when it happened ?

## 2021-10-06 NOTE — TOC Progression Note (Signed)
Transition of Care (TOC) - Progression Note  ? ? ?Patient Details  ?Name: Mckenzie [name] ?MRN: [id number] ?Date of Birth: [birth year] ? ?Transition of Care (TOC) CM/SW Contact  ?Salome Arnt, LCSW ?Phone Number: ?10/06/2021, 10:45 AM ? ?Clinical Narrative:  PT recommending HHPT. Discussed with pt who reports she is already active with [facility]. Per Lattie Haw with [facility], she receives RN and PT services. Pt currently in observation. Will need resumption orders if inpatient. TOC will continue to follow.   ? ? ? ?  ?  ? ?Expected Discharge Plan and Services ?  ?  ?  ?  ?  ?                ?  ?  ?  ?  ?  ?  ?  ?  ?  ?  ? ? ?Social Determinants of Health (SDOH) Interventions ?  ? ?Readmission Risk Interventions ?   ? View : No data to display.  ?  ?  ?  ? ? ?

## 2021-10-06 NOTE — Progress Notes (Addendum)
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  The patient was watching television at the time.  She denied any loss of vision, dysarthria, facial droop, or weakness in her arms or legs.  Notably, the patient had a TIA back in November 2022.  MRI of the brain was negative for acute findings at that time, the patient was instructed to take aspirin 81 mg daily.  However, the patient states that she has not been taking her aspirin daily.  It is unclear how long ago she stopped.  She endorses compliance with her other medications. She states that she had been in her usual state of health prior to the development of her numbness and tingling.  She denies any fevers, chills, headache, chest pain, shortness of breath, coughing, hemoptysis, nausea, vomiting or diarrhea, abdominal pain.  She has a chronic indwelling Foley catheter.  It was last changed about a 1 month prior to this admission.  In the ED, the patient was afebrile and hemodynamically stable with blood pressure up to 200/69.  Oxygen saturation was 99% on room air.  BMP showed sodium 139, potassium 3.8, bicarbonate 26, creatinine 0.50.  LFTs were unremarkable.  WBC 8.3, hemoglobin 11.2, platelets 174,000.  The patient was started on aspirin and admitted for further evaluation and treatment.   Assessment and Plan: * Sensory disturbance Concerned the patient likely had a small stroke She continues to have dysesthesias of her left upper and left lower extremity at the time of my evaluation Unable to obtain MRI brain secondary to PPM CT brain  negative CTAH&N--negative for significant intracranial stenosis.  No hemodynamically significant stenosis of the carotids PT evaluation LDL 52 Check A1c Continue aspirin 81 mg daily, add plavix Continue Lipitor 40 mg daily Neurology consult  Opioid dependence (HCC) PDMP reviewed Patient receives Norco 10/325, #150 monthly --last refill 09/08/21 Patient receives temazepam 30 mg, #30 monthly --last refill 09/22/21  Frequent falls Patient uses a walker PT evaluation She has a chronic appearing left clavicle fracture Patient endorses a fall onto her left side although does not remember exactly when it happened  Pulmonary infiltrates Personally reviewed chest x-ray-RUL>LUL opacities PCT 0.26 Patient does not have any leukocytosis, tachypnea, fever, or hypoxia -Defer antibiotics for now  Pyuria Patient has indwelling Foley catheter UA>50 WBC Send urine culture Defer antibiotics for now as the patient does not have mental status change, and she is afebrile without any dysuria or abdominal pain Change Foley catheter  Acquired hypothyroidism Continue levothyroxine Check TSH  Leg wound, left See pics below Not infected on exam --defer antibiotics for now --wound care consult  Essential (primary) hypertension Holding benazepril and metoprolol tartrate to allow for permissive hypertension Hydralazine prn SBP>220  CAD (coronary artery disease)/Stent to LAD, 2003 [facility] No chest pain presently Continue aspirin and statin Personally reviewed EKG--V paced rhythm  GERD (gastroesophageal reflux disease) Continue PPI  Mixed hyperlipidemia LDL 52 Continue statin       Family Communication:   attempted call spouse--left VM  Consultants:  neurology  Code Status:  FULL   DVT Prophylaxis:  Readlyn  Lovenox   Procedures: As Listed in Progress Note Above  Antibiotics: None       Subjective: States she still has L arm and L leg numbness and tingling.  Denies focal  weakness. Patient denies fevers, chills, headache, chest pain, dyspnea, nausea, vomiting, diarrhea, abdominal pain, dysuria, hematuria, hematochezia, and melena.   Objective: Vitals:   10/06/21 0200 10/06/21 0230 10/06/21 0321 10/06/21 0651  BP: (!) 155/76 (!) 197/82 (!) 171/85 (!) 157/77  Pulse: 60 96 99 98  Resp: 18 17 18 18   Temp:   98.5 F (36.9 C) 98.5 F (36.9 C)  TempSrc:   Oral Oral  SpO2: 94% 95% 99% 98%  Weight:   41.2 kg   Height:   5\' 3"  (1.6 m)    No intake or output data in the 24 hours ending 10/06/21 0738 Weight change:  Exam:  General:  Pt is alert, follows commands appropriately, not in acute distress HEENT: No icterus, No thrush, No neck mass, /AT Cardiovascular: RRR, S1/S2, no rubs, no gallops Respiratory: diminished BS.  Bilateral rales R>L Abdomen: Soft/+BS, non tender, non distended, no guarding Extremities: No edema, No lymphangitis, No petechiae, No rashes, no synovitis Neuro:  CN II-XII intact, strength 4/5 in RUE, RLE, strength 4-/5 LUE, LLE; sensation intact bilateral; no dysmetria; babinski equivocal LEFT LEG       Data Reviewed: I have personally reviewed following labs and imaging studies Basic Metabolic Panel: Recent Labs  Lab 10/06/21 0050 10/06/21 0058 10/06/21 0414  NA 139 138  --   K 3.8 3.8  --   CL 105 103  --   CO2 26  --   --   GLUCOSE 120* 117*  --   BUN 24* 22  --   CREATININE 0.50 0.50  --   CALCIUM 8.4*  --   --   MG  --   --  1.7  PHOS  --   --  3.3   Liver Function Tests: Recent Labs  Lab 10/06/21 0050  AST 24  ALT 19  ALKPHOS 98  BILITOT 0.6  PROT 6.7  ALBUMIN 3.3*   No results for input(s): LIPASE, AMYLASE in the last 168 hours. No results for input(s): AMMONIA in the last 168 hours. Coagulation Profile: Recent Labs  Lab 10/06/21 0050  INR 1.0   CBC: Recent Labs  Lab 10/06/21 0050 10/06/21 0058  WBC 8.3  --   NEUTROABS 6.8  --   HGB 11.2* 11.9*  HCT 34.5* 35.0*  MCV 91.8  --   PLT 174   --    Cardiac Enzymes: No results for input(s): CKTOTAL, CKMB, CKMBINDEX, TROPONINI in the last 168 hours. BNP: Invalid input(s): POCBNP CBG: Recent Labs  Lab 10/06/21 0047  GLUCAP 121*   HbA1C: No results for input(s): HGBA1C in the last 72 hours. Urine analysis:    Component Value Date/Time   COLORURINE YELLOW 10/06/2021 0046   APPEARANCEUR HAZY (A) 10/06/2021 0046   LABSPEC 1.028 10/06/2021 0046   PHURINE 8.0 10/06/2021 0046   GLUCOSEU NEGATIVE 10/06/2021 0046   HGBUR SMALL (A) 10/06/2021 0046   BILIRUBINUR NEGATIVE 10/06/2021 0046   KETONESUR NEGATIVE 10/06/2021 0046   PROTEINUR 30 (A) 10/06/2021 0046   NITRITE NEGATIVE 10/06/2021 0046   LEUKOCYTESUR LARGE (A) 10/06/2021 0046   Sepsis Labs: @LABRCNTIP (procalcitonin:4,lacticidven:4) ) Recent Results (from the past 240 hour(s))  Resp Panel by RT-PCR (Flu A&B, Covid) Nasopharyngeal Swab     Status: None   Collection Time: 10/06/21 12:46 AM  Specimen: Nasopharyngeal Swab; Nasopharyngeal(NP) swabs in vial transport medium  Result Value Ref Range Status   SARS Coronavirus 2 by RT PCR NEGATIVE NEGATIVE Final    Comment: (NOTE) SARS-CoV-2 target nucleic acids are NOT DETECTED.  The SARS-CoV-2 RNA is generally detectable in upper respiratory specimens during the acute phase of infection. The lowest concentration of SARS-CoV-2 viral copies this assay can detect is 138 copies/mL. A negative result does not preclude SARS-Cov-2 infection and should not be used as the sole basis for treatment or other patient management decisions. A negative result may occur with  improper specimen collection/handling, submission of specimen other than nasopharyngeal swab, presence of viral mutation(s) within the areas targeted by this assay, and inadequate number of viral copies(<138 copies/mL). A negative result must be combined with clinical observations, patient history, and epidemiological information. The expected result is  Negative.  Fact Sheet for Patients:  BloggerCourse.com  Fact Sheet for Healthcare Providers:  SeriousBroker.it  This test is no t yet approved or cleared by the [place] FDA and  has been authorized for detection and/or diagnosis of SARS-CoV-2 by FDA under an Emergency Use Authorization (EUA). This EUA will remain  in effect (meaning this test can be used) for the duration of the COVID-19 declaration under Section 564(b)(1) of the Act, 21 U.S.C.section 360bbb-3(b)(1), unless the authorization is terminated  or revoked sooner.       Influenza A by PCR NEGATIVE NEGATIVE Final   Influenza B by PCR NEGATIVE NEGATIVE Final    Comment: (NOTE) The Xpert Xpress SARS-CoV-2/FLU/RSV plus assay is intended as an aid in the diagnosis of influenza from Nasopharyngeal swab specimens and should not be used as a sole basis for treatment. Nasal washings and aspirates are unacceptable for Xpert Xpress SARS-CoV-2/FLU/RSV testing.  Fact Sheet for Patients: BloggerCourse.com  Fact Sheet for Healthcare Providers: SeriousBroker.it  This test is not yet approved or cleared by the [place] FDA and has been authorized for detection and/or diagnosis of SARS-CoV-2 by FDA under an Emergency Use Authorization (EUA). This EUA will remain in effect (meaning this test can be used) for the duration of the COVID-19 declaration under Section 564(b)(1) of the Act, 21 U.S.C. section 360bbb-3(b)(1), unless the authorization is terminated or revoked.  Performed at [facility], [address], [place], [place] [address]      Scheduled Meds:  aspirin EC  81 mg Oral Daily   atorvastatin  40 mg Oral Daily   feeding supplement  237 mL Oral BID BM   levothyroxine  25 mcg Oral Q0600   pantoprazole  80 mg Oral Q1200   Continuous Infusions:  Procedures/Studies: DG Chest 1 View  Result Date:  10/06/2021 CLINICAL DATA:  Abnormal findings in the apices of the lungs on CT angio neck EXAM: CHEST  1 VIEW COMPARISON:  05/04/2021 CT and x-ray. FINDINGS: Left pacer in place with leads in the right atrium and right ventricle, unchanged. Heart is mildly enlarged. Scarring in the apices as seen on prior x-ray and CT. Patchy opacities elsewhere throughout the upper and mid lungs bilaterally. No effusions or acute bony abnormality. IMPRESSION: Chronic changes in the apices compatible with scarring. Patchy airspace opacities elsewhere throughout the upper and mid lungs bilaterally could reflect pneumonia. Mild cardiomegaly. Electronically Signed   By: Charlett Nose M.D.   On: 10/06/2021 02:49   CT HEAD CODE STROKE WO CONTRAST  Result Date: 10/06/2021 CLINICAL DATA:  Code stroke. Acute neuro deficit. Left-sided tingling EXAM: CT HEAD WITHOUT CONTRAST TECHNIQUE:  Contiguous axial images were obtained from the base of the skull through the vertex without intravenous contrast. RADIATION DOSE REDUCTION: This exam was performed according to the departmental dose-optimization program which includes automated exposure control, adjustment of the mA and/or kV according to patient size and/or use of iterative reconstruction technique. COMPARISON:  None. FINDINGS: Brain: There is no mass, hemorrhage or extra-axial collection. There is generalized atrophy without lobar predilection. There is hypoattenuation of the periventricular white matter, most commonly indicating chronic ischemic microangiopathy. Vascular: No abnormal hyperdensity of the major intracranial arteries or dural venous sinuses. No intracranial atherosclerosis. Skull: The visualized skull base, calvarium and extracranial soft tissues are normal. Sinuses/Orbits: No fluid levels or advanced mucosal thickening of the visualized paranasal sinuses. No mastoid or middle ear effusion. The orbits are normal. ASPECTS [facility] Stroke Program Early CT Score) - Ganglionic  level infarction (caudate, lentiform nuclei, internal capsule, insula, M1-M3 cortex): 7 - Supraganglionic infarction (M4-M6 cortex): 3 Total score (0-10 with 10 being normal): 10 IMPRESSION: 1. No acute intracranial abnormality. 2. ASPECTS is 10. These results were called by telephone at the time of interpretation on 10/06/2021 at 1:00 am to provider [facility] , who verbally acknowledged these results. Electronically Signed   By: Deatra Robinson M.D.   On: 10/06/2021 01:00   CT ANGIO HEAD NECK W WO CM (CODE STROKE)  Result Date: 10/06/2021 CLINICAL DATA:  Left-sided tingling EXAM: CT ANGIOGRAPHY HEAD AND NECK TECHNIQUE: Multidetector CT imaging of the head and neck was performed using the standard protocol during bolus administration of intravenous contrast. Multiplanar CT image reconstructions and MIPs were obtained to evaluate the vascular anatomy. Carotid stenosis measurements (when applicable) are obtained utilizing NASCET criteria, using the distal internal carotid diameter as the denominator. RADIATION DOSE REDUCTION: This exam was performed according to the departmental dose-optimization program which includes automated exposure control, adjustment of the mA and/or kV according to patient size and/or use of iterative reconstruction technique. CONTRAST:  75mL OMNIPAQUE IOHEXOL 350 MG/ML SOLN COMPARISON:  04/22/2021 FINDINGS: CTA NECK FINDINGS SKELETON: There is a comminuted fracture of the left clavicle. There is periosteal new bone formation around the fracture. OTHER NECK: Normal pharynx, larynx and major salivary glands. No cervical lymphadenopathy. Unremarkable thyroid gland. UPPER CHEST: Patchy multifocal bilateral upper lobe opacity. Emphysema. AORTIC ARCH: There is calcific atherosclerosis of the aortic arch. There is no aneurysm, dissection or hemodynamically significant stenosis of the visualized portion of the aorta. Conventional 3 vessel aortic branching pattern. The visualized proximal  subclavian arteries are widely patent. RIGHT CAROTID SYSTEM: No dissection, occlusion or aneurysm. Mild atherosclerotic calcification at the carotid bifurcation without hemodynamically significant stenosis. LEFT CAROTID SYSTEM: No dissection, occlusion or aneurysm. Mild atherosclerotic calcification at the carotid bifurcation without hemodynamically significant stenosis. VERTEBRAL ARTERIES: Left dominant configuration. Both origins are clearly patent. There is no dissection, occlusion or flow-limiting stenosis to the skull base (V1-V3 segments). CTA HEAD FINDINGS POSTERIOR CIRCULATION: --Vertebral arteries: Normal V4 segments. --Inferior cerebellar arteries: Normal. --Basilar artery: Normal. --Superior cerebellar arteries: Normal. --Posterior cerebral arteries (PCA): Normal. ANTERIOR CIRCULATION: --Intracranial internal carotid arteries: Normal. --Anterior cerebral arteries (ACA): Normal. Both A1 segments are present. Patent anterior communicating artery (a-comm). --Middle cerebral arteries (MCA): Normal. VENOUS SINUSES: As permitted by contrast timing, patent. ANATOMIC VARIANTS: None Review of the MIP images confirms the above findings. IMPRESSION: 1. No emergent large vessel occlusion or hemodynamically significant stenosis of the intracranial or cervical arteries. 2. Comminuted fracture of the left clavicle, likely chronic given the surrounding periosteal new bone  formation, but new since 04/22/2021. 3. Patchy multifocal bilateral upper lobe opacity, concerning for infection. Aortic Atherosclerosis (ICD10-I70.0) and Emphysema (ICD10-J43.9). Electronically Signed   By: Deatra Robinson M.D.   On: 10/06/2021 01:26    Catarina Hartshorn, DO  [facility]  If 7PM-7AM, please contact night-coverage [email or web address] Password [facility] 10/06/2021, 7:38 AM   LOS: 0 days

## 2021-10-06 NOTE — ED Notes (Signed)
Patient transported to CT 

## 2021-10-06 NOTE — ED Provider Notes (Signed)
?[facility]-EMERGENCY DEPT ?[facility] Emergency Department ?Provider Note ?MRN:  [id number]  ?Arrival date & time: 10/06/21    ? ?Chief Complaint   ?Tingling ?  ?History of Present Illness   ?Mckenzie [name] is a 86 y.o. year-old female with a history of CAD, carotid artery disease, COPD presenting to the ED with chief complaint of tingling. ? ?Sudden onset sensory disturbance to the left arm and left leg about 2 hours ago.  Was feeling normal at 1030 and then the symptoms started.  Described as a pins-and-needles sensation in the left arm and leg with some mild sensory deficit.  Also endorsing some weakness to the left leg.  No speech change, no vision change, no headache. ? ?Review of Systems  ?A thorough review of systems was obtained and all systems are negative except as noted in the HPI and PMH.  ? ?Patient's Health History   ? ?Past Medical History:  ?Diagnosis Date  ? Aortic valve sclerosis   ? Mild, echo, April, 2012  ? ARDS (adult respiratory distress syndrome) (HCC) 2003  ? with tracheostomy-peritonitis  ? Arthritis   ? Back pain   ? Need for surgery, Dr. Trey Sailors  ? CAD (coronary artery disease)   ? Stent to LAD, 2003 [facility] / nuclear scan April, 201 to artifact / persistent shortness of breath / catheterization Oct 21, 2010... widely patent stent to the mid LAD, no significant obstructive disease, should be low risk    for back surgery  ? Carotid arterial disease (HCC)   ? Moderate-Dr. Myra Gianotti following  ? Chronic back pain   ? COPD (chronic obstructive pulmonary disease) (HCC)   ? Diastolic dysfunction   ? Echo, April, 2011   EF 65%  ? Dysrhythmia   ? afib  ? Ejection fraction   ? EF 65%, echo, September 15, 2010  ? GERD (gastroesophageal reflux disease)   ? Hemolytic anemia (HCC)   ? Followed by Dr. Derald Macleod  ? Hypertension   ? Hypothyroidism   ? Left ventricular hypertrophy   ? EF 65%, echo, April, 2011  ? MR (mitral regurgitation)   ?  Mild echo, 2012 ///  Mild per Echo 2009,  mild, echo, April, 2011  ?  Myocardial infarction [facility])   ? Palpitations   ? Holter, September 16, 2010, PACs and PVCs,one 4 beat run of SVT  ? Pneumonia 11-2011  ? Preoperative clearance   ? Preop clearance for back surgery  ? Shortness of breath   ? March, 2012  ? UTI (urinary tract infection)   ? Chronic-on Macrodantin  ?  ?Past Surgical History:  ?Procedure Laterality Date  ? APPENDECTOMY    ? BACK SURGERY    ? BREAST LUMPECTOMY Left   ? CATARACT EXTRACTION W/PHACO Right 12/05/2012  ? Procedure: CATARACT EXTRACTION PHACO AND INTRAOCULAR LENS PLACEMENT (IOC);  Surgeon: Gemma Payor, MD;  Location: [facility] ORS;  Service: Ophthalmology;  Laterality: Right;  CDE:15.30  ? CATARACT EXTRACTION W/PHACO Left 12/15/2012  ? Procedure: CATARACT EXTRACTION PHACO AND INTRAOCULAR LENS PLACEMENT (IOC);  Surgeon: Gemma Payor, MD;  Location: [facility] ORS;  Service: Ophthalmology;  Laterality: Left;  CDE:22.56  ? CORONARY ANGIOPLASTY WITH STENT PLACEMENT  2003  ? INTRAMEDULLARY (IM) NAIL INTERTROCHANTERIC Left 09/27/2020  ? Procedure: INTRAMEDULLARY (IM) NAIL INTERTROCHANTRIC;  Surgeon: Cammy Copa, MD;  Location: [facility] OR;  Service: Orthopedics;  Laterality: Left;  left trochanter  ? PACEMAKER IMPLANT N/A 04/23/2021  ? Procedure: PACEMAKER IMPLANT;  Surgeon: Regan Lemming, MD;  Location: [facility] INVASIVE CV LAB;  Service: Cardiovascular;  Laterality: N/A;  ? repair of ruptured lumbar discs  12-2011  ? SPINE SURGERY    ? lumbar [facility]  ? THYROID SURGERY  08/2009  ? Benign adenomas  ? TONSILLECTOMY    ? TRACHEOSTOMY  2003  ? Following ARDS   ?  ?Family History  ?Problem Relation Age of Onset  ? Heart failure Father   ? Diabetes Son   ?  ?Social History  ? ?Socioeconomic History  ? Marital status: Married  ?  Spouse name: Not on file  ? Number of children: 1  ? Years of education: Not on file  ? Highest education level: Not on file  ?Occupational History  ? Occupation: Retired  ?Tobacco Use  ? Smoking status: Former  ?  Packs/day: 1.00   ?  Years: 20.00  ?  Pack years: 20.00  ?  Types: Cigarettes  ?  Quit date: 06/15/2000  ?  Years since quitting: 21.3  ? Smokeless tobacco: Never  ?Vaping Use  ? Vaping Use: Never used  ?Substance and Sexual Activity  ? Alcohol use: No  ? Drug use: No  ? Sexual activity: Not on file  ?Other Topics Concern  ? Not on file  ?Social History Narrative  ? Regular Exercise: Yes-Walk  ? ?Social Determinants of Health  ? ?Financial Resource Strain: Not on file  ?Food Insecurity: Not on file  ?Transportation Needs: Not on file  ?Physical Activity: Not on file  ?Stress: Not on file  ?Social Connections: Not on file  ?Intimate Partner Violence: Not on file  ?  ? ?Physical Exam  ? ?Vitals:  ? 10/06/21 0130 10/06/21 0145  ?BP: (!) 167/90 (!) 171/79  ?Pulse: 92 98  ?Resp: (!) 23 (!) 24  ?Temp:    ?SpO2: 95% 94%  ?  ?CONSTITUTIONAL: Chronically ill-appearing, NAD ?NEURO/PSYCH:  Alert and oriented x 3, subjective decreased sensation to the left arm and leg, mild weakness to the left leg ?EYES:  eyes equal and reactive ?ENT/NECK:  no LAD, no JVD ?CARDIO: Regular rate, well-perfused, normal S1 and S2 ?PULM:  CTAB no wheezing or rhonchi ?GI/GU:  non-distended, non-tender ?MSK/SPINE:  No gross deformities, pitting edema to the left lower extremity ?SKIN:  no rash, atraumatic ? ? ?*Additional and/or pertinent findings included in MDM below ? ?Diagnostic and Interventional Summary  ? ? EKG Interpretation ? ?Date/Time:  Monday October 06 2021 00:34:24 EDT ?Ventricular Rate:  94 ?PR Interval:  57 ?QRS Duration: 125 ?QT Interval:  400 ?QTC Calculation: 501 ?R Axis:   -62 ?Text Interpretation: Atrial-sensed ventricular-paced complexes No further analysis attempted due to paced rhythm Confirmed by Kennis CarinaBero, Zoa Dowty [phone] on 10/06/2021 1:52:02 AM ?  ? ?  ? ?Labs Reviewed  ?CBC - Abnormal; Notable for the following components:  ?    Result Value  ? RBC 3.76 (*)   ? Hemoglobin 11.2 (*)   ? HCT 34.5 (*)   ? All other components within normal limits   ?COMPREHENSIVE METABOLIC PANEL - Abnormal; Notable for the following components:  ? Glucose, Bld 120 (*)   ? BUN 24 (*)   ? Calcium 8.4 (*)   ? Albumin 3.3 (*)   ? All other components within normal limits  ?I-STAT CHEM 8, ED - Abnormal; Notable for the following components:  ? Glucose, Bld 117 (*)   ? Calcium, Ion 1.06 (*)   ? Hemoglobin 11.9 (*)   ?  HCT 35.0 (*)   ? All other components within normal limits  ?CBG MONITORING, ED - Abnormal; Notable for the following components:  ? Glucose-Capillary 121 (*)   ? All other components within normal limits  ?RESP PANEL BY RT-PCR (FLU A&B, COVID) ARPGX2  ?ETHANOL  ?PROTIME-INR  ?APTT  ?DIFFERENTIAL  ?RAPID URINE DRUG SCREEN, HOSP PERFORMED  ?URINALYSIS, ROUTINE W REFLEX MICROSCOPIC  ?  ?CT ANGIO HEAD NECK W WO CM (CODE STROKE)  ?Final Result  ?  ?CT HEAD CODE STROKE WO CONTRAST  ?Final Result  ?  ?  ?Medications  ?iohexol (OMNIPAQUE) 350 MG/ML injection 75 mL (75 mLs Intravenous Contrast Given 10/06/21 0102)  ?  ? ?Procedures  /  Critical Care ?.Critical Care ?Performed by: Sabas Sous, MD ?Authorized by: Sabas Sous, MD  ? ?Critical care provider statement:  ?  Critical care time (minutes):  35 ?  Critical care was necessary to treat or prevent imminent or life-threatening deterioration of the following conditions: Concern for acute ischemic stroke. ?  Critical care was time spent personally by me on the following activities:  Development of treatment plan with patient or surrogate, discussions with consultants, evaluation of patient's response to treatment, examination of patient, ordering and review of laboratory studies, ordering and review of radiographic studies, ordering and performing treatments and interventions, pulse oximetry, re-evaluation of patient's condition and review of old charts ? ?ED Course and Medical Decision Making  ?Initial Impression and Ddx ?Concern for acute ischemic stroke, last known well at 10:30 PM.  Reportedly declined EMS  transport.  Code stroke initiated, awaiting teleneurology evaluation, CT imaging. ? ?Past medical/surgical history that increases complexity of ED encounter: CAD ? ?Interpretation of Diagnostics ?I personally rev

## 2021-10-06 NOTE — Progress Notes (Signed)
Unable to do 1050 assessment and vitals due to echo being performed during that time ?

## 2021-10-06 NOTE — Consult Note (Signed)
WOC Nurse Consult Note: ?Patient receiving care in [facility]. Consult completed remotely after review of record and images of LLE wound. ?Reason for Consult: LLE wound ?Wound type: unclear at this time ?Pressure Injury POA: Yes/No/NA ?Measurement: To be provided by the bedside RN in the flowsheet section  ?Wound bed: see photos ?Drainage (amount, consistency, odor)  ?Periwound: intact ?Dressing procedure/placement/frequency: ?Cleanse the wound to the LLE with NS. Pat dry then apply a nickel thick layer of MediHoney directly to the wound, Cover with a foam dressing. Change dressing daily.  ? ?Monitor the wound area(s) for worsening of condition such as: ?Signs/symptoms of infection,  ?Increase in size,  ?Development of or worsening of odor, ?Development of pain, or increased pain at the affected locations.  Notify the medical team if any of these develop. ? ?WOC nurse will not follow at this time.  Please re-consult the WOC team if needed. ? ?Helmut Muster, RN, MSN, CWOCN, CNS-BC, pager [phone]  ? ?  ?

## 2021-10-06 NOTE — Progress Notes (Signed)
SLP Cancellation Note ? ?Patient Details ?Name: Mckenzie [name] ?MRN: [id number] ?DOB: 05/05/1936 ? ? ?Cancelled treatment:       Reason Eval/Treat Not Completed: SLP screened, no needs identified, will sign off; SLP screened Pt in room. Pt denies any changes in swallowing, speech, language, or cognition. Head CT negative for acute changes. SLE will be deferred at this time. Reconsult if indicated. SLP will sign off. ? ? ?Thank you, ? ?Havery Moros, CCC-SLP ?[phone] ? ?Marea Reasner ?10/06/2021, 5:13 PM ?

## 2021-10-06 NOTE — Plan of Care (Signed)
?  Problem: Acute Rehab PT Goals(only PT should resolve) ?Goal: Pt Will Go Supine/Side To Sit ?Outcome: Progressing ?Flowsheets (Taken 10/06/2021 1506) ?Pt will go Supine/Side to Sit: Independently ?Goal: Patient Will Transfer Sit To/From Stand ?Outcome: Progressing ?Flowsheets (Taken 10/06/2021 1506) ?Patient will transfer sit to/from stand: with modified independence ?Goal: Pt Will Transfer Bed To Chair/Chair To Bed ?Outcome: Progressing ?Flowsheets (Taken 10/06/2021 1506) ?Pt will Transfer Bed to Chair/Chair to Bed: with modified independence ?Goal: Pt Will Ambulate ?Outcome: Progressing ?Flowsheets (Taken 10/06/2021 1506) ?Pt will Ambulate: ? > 125 feet ? with modified independence ? with rolling walker ?  ?3:06 PM, 10/06/21 ?Ocie Bob, MPT ?Physical Therapist with [place] ?[facility] ?[phone] office ?[phone] mobile phone ? ?

## 2021-10-06 NOTE — Assessment & Plan Note (Addendum)
Continue levothyroxine 

## 2021-10-06 NOTE — Discharge Summary (Addendum)
?Physician Discharge Summary ?  ?Patient: Mckenzie [name] MRN: [id number] DOB: 1936/04/20  ?Admit date:     10/06/2021  ?Discharge date: 10/06/21  ?Discharge Physician: Shanon Brow Gustin Zobrist  ? ?PCP: Rosalee Kaufman, PA-C  ? ?Recommendations at discharge:  ? Please follow up with primary care provider within 1-2 weeks ? Please repeat BMP and CBC in one week ? Please follow up on/with  ? ? ?Discharge Diagnoses: ?Principal Problem: ?  Sensory disturbance ?Active Problems: ?  Mixed hyperlipidemia ?  GERD (gastroesophageal reflux disease) ?  CAD (coronary artery disease)/Stent to LAD, 2003 [place] ?  Essential (primary) hypertension ?  Leg wound, left ?  Acquired hypothyroidism ?  Pyuria ?  Pulmonary infiltrates ?  Frequent falls ?  Opioid dependence ([employer]) ? ?Resolved Problems: ?  * No resolved hospital problems. * ? 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  The patient was watching television at the time.  She denied any loss of vision, dysarthria, facial droop, or weakness in her arms or legs.  Notably, the patient had a TIA back in November 2022.  MRI of the brain was negative for acute findings at that time, the patient was instructed to take aspirin 81 mg daily.  However, the patient states that she has not been taking her aspirin daily.  It is unclear how long ago she stopped.  She endorses compliance with her other medications. ?She states that she had been in her usual state of health prior to the development of her numbness and tingling.  She denies any fevers, chills, headache, chest pain, shortness of breath, coughing, hemoptysis, nausea, vomiting or diarrhea, abdominal pain.  She has a chronic indwelling Foley catheter.  It  was last changed about a 1 month prior to this admission.  In the ED, the patient was afebrile and hemodynamically stable with blood pressure up to 200/69.  Oxygen saturation was 99% on room air.  BMP showed sodium 139, potassium 3.8, bicarbonate 26, creatinine 0.50.  LFTs were unremarkable.  WBC 8.3, hemoglobin 11.2, platelets 174,000.  The patient was started on aspirin and admitted for further evaluation and treatment. ? ?Assessment and Plan: ?* Sensory disturbance ?Concerned the patient likely had a small stroke ?She continues to have dysesthesias of her left upper and left lower extremity at the time of my evaluation ?Unable to obtain MRI brain secondary to PPM ?CT brain negative ?CTAH&N--negative for significant intracranial stenosis.  No hemodynamically significant stenosis of the carotids ?PT evaluation>>HHPT ?LDL 52 ?Check A1c--4.9 ?Echo--EF 60-65% no WMA, no PFO ?Continue aspirin 81 mg daily, add plavix 75 mg x 21 days, then ASA monotherapy thereafter ?Continue Lipitor 40 mg daily ?Neurology consult>>likely posterior circulation acute ischemic stroke ?I sent message to electrophysiology to request PPM interrogation for any afib/aflutter after d/c ? ?Opioid dependence ([place]) ?PDMP reviewed ?Patient receives Norco 10/325, #150 monthly ?--last refill 09/08/21 ?Patient receives temazepam 30 mg, #30 monthly ?--last refill 09/22/21 ? ?Frequent falls ?Patient uses a walker ?PT evaluation ?She has a chronic appearing left clavicle fracture ?Patient endorses a fall onto her left side although does not remember exactly when it happened ? ?Pulmonary infiltrates ?Personally reviewed chest x-ray-RUL>LUL opacities ?PCT 0.26 ?Patient does not have any leukocytosis, tachypnea, fever, or hypoxia ?-  Defer antibiotics for now ? ?Pyuria ?Patient has indwelling Foley catheter ?UA>50 WBC ?Send urine culture ?Defer antibiotics for now as the patient does not have mental status change, and she is afebrile without any dysuria or  abdominal pain ?Changed Foley catheter 4/24 ? ?Acquired hypothyroidism ?Continue levothyroxine ? ?Leg wound, left ?See pics below ?Not infected on exam ?--defer antibiotics for now ?--wound care consult>>Cleanse the wound to the LLE with NS. Pat dry then apply a nickel thick layer of MediHoney directly to the wound, Cover with a foam dressing. Change dressing daily.  ?  ? ?Essential (primary) hypertension ?Holding benazepril and metoprolol tartrate to allow for permissive hypertension ?Hydralazine prn SBP>220 ? ?CAD (coronary artery disease)/Stent to LAD, 2003 [place] ?No chest pain presently ?Continue aspirin and statin ?Personally reviewed EKG--V paced rhythm ? ?GERD (gastroesophageal reflux disease) ?Hold PPI x 21 days while taking plavix ?Gave patient and spouse instructions to hold PPI ? ?Mixed hyperlipidemia ?LDL 52 ?Continue statin ? ? ? ? ?  ? ? ?Consultants: neurology ?Procedures performed: none  ?Disposition: Home ?Diet recommendation:  ?Cardiac diet ?DISCHARGE MEDICATION: ?Allergies as of 10/06/2021   ? ?   Reactions  ? Tetracycline Nausea Only  ? ?  ? ?  ?Medication List  ?  ? ?STOP taking these medications   ? ?esomeprazole 40 MG capsule ?Commonly known as: [place] ?  ?methocarbamol 500 MG tablet ?Commonly known as: ROBAXIN ?  ?metoprolol tartrate 25 MG tablet ?Commonly known as: LOPRESSOR ?  ?mometasone-formoterol 100-5 MCG/ACT Aero ?Commonly known as: DULERA ?  ?traMADol 50 MG tablet ?Commonly known as: ULTRAM ?  ? ?  ? ?TAKE these medications   ? ?aspirin 81 MG EC tablet ?Take 1 tablet (81 mg total) by mouth daily. Swallow whole. ?Start taking on: October 07, 2021 ?  ?atorvastatin 40 MG tablet ?Commonly known as: LIPITOR ?Take 1 tablet (40 mg total) by mouth daily. ?  ?benazepril 20 MG tablet ?Commonly known as: LOTENSIN ?Take 1 tablet (20 mg total) by mouth daily. ?  ?CALCIUM 600 PO ?Take 1,200 mg by mouth daily. ?  ?clopidogrel 75 MG tablet ?Commonly known as: PLAVIX ?Take 1 tablet (75 mg total) by mouth  daily. ?Start taking on: October 07, 2021 ?  ?conjugated estrogens 0.625 MG/GM vaginal cream ?Commonly known as: PREMARIN ?Place 1 Applicatorful vaginally daily. ?  ?diphenhydrAMINE 25 mg capsule ?Commonly known as: BENADRYL ?Take 25 mg by mouth daily as needed for sleep. ?  ?Ferrex 150 150 MG capsule ?Generic drug: iron polysaccharides ?Take 150 mg by mouth daily. ?  ?HYDROcodone-acetaminophen 10-325 MG tablet ?Commonly known as: NORCO ?Take 1 tablet by mouth every 4 (four) hours as needed for severe pain. ?  ?leptospermum manuka honey Pste paste ?Apply 1 application. topically daily. ?Start taking on: October 07, 2021 ?  ?levothyroxine 25 MCG tablet ?Commonly known as: SYNTHROID ?TAKE 1 TABLET BY MOUTH DAILY AT 6 AM. ?What changed: See the new instructions. ?  ?Melatonin 10 MG Tabs ?Take 1 tablet by mouth at bedtime. ?  ?metoprolol succinate 25 MG 24 hr tablet ?Commonly known as: TOPROL-XL ?Take 25 mg by mouth daily. ?  ?Movantik 25 MG Tabs tablet ?Generic drug: naloxegol oxalate ?Take 25 mg by mouth daily. ?  ?multivitamin tablet ?Take 1 tablet by mouth daily. ?  ?nitroGLYCERIN 0.4 MG SL tablet ?Commonly known as: NITROSTAT ?Place under the tongue. ?  ?polyethylene glycol 17 g packet ?Commonly known as: MIRALAX / GLYCOLAX ?Take 17 g by mouth every morning. ?  ?  raloxifene 60 MG tablet ?Commonly known as: EVISTA ?Take 60 mg by mouth daily. ?  ?tamsulosin 0.4 MG Caps capsule ?Commonly known as: FLOMAX ?Take 0.4 mg by mouth daily. ?  ?temazepam 30 MG capsule ?Commonly known as: RESTORIL ?Take 30 mg by mouth at bedtime. ?  ?tretinoin 0.05 % cream ?Commonly known as: RETIN-A ?Apply 1 application topically at bedtime. ?  ?Vitamin D3 25 MCG (1000 UT) Caps ?Take 1,000 Units by mouth daily. Take one tab daily ?  ? ?  ? ? ?Discharge Exam: ?Filed Weights  ? 10/06/21 0033 10/06/21 0321  ?Weight: 43.1 kg 41.2 kg  ? ?HEENT:  [place]/AT, No thrush, no icterus ?CV:  RRR, no rub, no S3, no S4 ?Lung:  diminished BS.  Bibasilar rales ?Abd:   soft/+BS, NT ?Ext:  No edema, no lymphangitis, no synovitis, no rash ? ? ?Condition at discharge: stable ? ?The results of significant diagnostics from this hospitalization (including imaging, microbiology

## 2021-10-06 NOTE — Evaluation (Signed)
Occupational Therapy Evaluation ?Patient Details ?Name: Mckenzie [name] ?MRN: [id number] ?DOB: Apr 14, 1936 ?Today's Date: 10/06/2021 ? ? ?History of Present Illness Mckenzie [name] is a 86 y.o. female with medical history significant of hyperlipidemia, GERD, hypertension, hypothyroidism, CAD s/p stent placement who presents to the emergency department due to sudden onset of tingling sensation in the left arm and left leg about 2 hours prior to arrival to the ED.  Last well-known time was 10:30 PM yesterday.  Patient complained of pins-and-needles sensation in left arm and leg which started while watching TV at home.  It was associated with some mild sensory deficits as well as some left leg weakness.  She denies fever, chills, chest pain, shortness of breath, headache, speech or vision changes.  ? ?Clinical Impression ?  ?Patient in bed upon therapy arrival and agreeable to participate in OT evaluation. Patient continues to report numbness and tingling on the left side of her body with no relief since the symptoms have started. Pt is currently functioning at baseline of Modified Independent. May need PRN assistance at home from spouse if needed. No further OT needs at this time.   ?   ? ?Recommendations for follow up therapy are one component of a multi-disciplinary discharge planning process, led by the attending physician.  Recommendations may be updated based on patient status, additional functional criteria and insurance authorization.  ? ?Follow Up Recommendations ? No OT follow up  ?  ?Assistance Recommended at Discharge PRN  ?   ?Functional Status Assessment ? Patient has not had a recent decline in their functional status  ?Equipment Recommendations ? None recommended by OT  ?  ?   ?Precautions / Restrictions Precautions ?Precautions: None ?Restrictions ?Weight Bearing Restrictions: No  ? ?  ? ?Mobility Bed Mobility ?Overal bed mobility: Independent ?  ?  ? ?Transfers ?Overall transfer level: Modified  independent ?Equipment used: Rolling walker (2 wheels) ?Transfers: Bed to chair/wheelchair/BSC, Sit to/from Stand ?Sit to Stand: Modified independent (Device/Increase time) ?  ?  ?Step pivot transfers: Modified independent (Device/Increase time) ?  ?  ?  ?  ? ?  ?Balance Overall balance assessment: No apparent balance deficits (not formally assessed) ?  ?  ?   ? ?ADL either performed or assessed with clinical judgement  ? ?ADL Overall ADL's : Modified independent ?  ?  ?General ADL Comments: May need increased time due to unfamiliar evironment.  ? ? ? ?Vision Baseline Vision/History: 1 Wears glasses ?Wears Glasses: Reading only ?Patient Visual Report: No change from baseline ?Vision Assessment?: No apparent visual deficits  ?   ?   ?   ? ?Pertinent Vitals/Pain Pain Assessment ?Pain Assessment: 0-10 ?Pain Score: 9  ?Pain Location: left arm and leg (left side) ?Pain Descriptors / Indicators: Numbness, Tingling ?Pain Intervention(s): Monitored during session  ? ? ? ?Hand Dominance Right ?  ?Extremity/Trunk Assessment Upper Extremity Assessment ?Upper Extremity Assessment: Overall WFL for tasks assessed ?  ?Lower Extremity Assessment ?Lower Extremity Assessment: Defer to PT evaluation ?  ?  ?  ?Communication Communication ?Communication: No difficulties;Other (comment) (Mild HOH) ?  ?Cognition Arousal/Alertness: Awake/alert ?Behavior During Therapy: [facility] for tasks assessed/performed ?Overall Cognitive Status: Within Functional Limits for tasks assessed ?  ?  ?  ?  ?   ?   ?   ? ? ?Home Living Family/patient expects to be discharged to:: Private residence ?Living Arrangements: Spouse/significant other ?Available Help at Discharge: Family;Available 24 hours/day ?Type of Home: House ?Home Access: Stairs to enter ?  Entrance Stairs-Number of Steps: 1 ?  ?Home Layout: One level ?  ?  ?Bathroom Shower/Tub: Walk-in shower ?  ?Bathroom Toilet: Standard ?Bathroom Accessibility: Yes ?  ?Home Equipment: Rolling Walker (2 wheels);Grab  bars - tub/shower ?  ?Additional Comments: reports that she started to use the walker since last year due to imbalance. ?  ? ?  ?Prior Functioning/Environment Prior Level of Function : Driving;Independent/Modified Independent ?  ?  ? ?  ?  ?OT Problem List: Impaired balance (sitting and/or standing) ?  ?   ?   ?   ?   ? ?Co-evaluation PT/OT/SLP Co-Evaluation/Treatment: Yes ?Reason for Co-Treatment: To address functional/ADL transfers ?  ?OT goals addressed during session: Strengthening/ROM;Proper use of Adaptive equipment and DME;ADL's and self-care ?  ? ?  ?AM-PAC OT "6 Clicks" Daily Activity     ?Outcome Measure Help from another person eating meals?: None ?Help from another person taking care of personal grooming?: None ?Help from another person toileting, which includes using toliet, bedpan, or urinal?: None ?Help from another person bathing (including washing, rinsing, drying)?: None ?Help from another person to put on and taking off regular upper body clothing?: None ?Help from another person to put on and taking off regular lower body clothing?: None ?6 Click Score: 24 ?  ?End of Session Equipment Utilized During Treatment: Rolling walker (2 wheels) ? ?Activity Tolerance: Patient tolerated treatment well ?Patient left: in chair;with call bell/phone within reach ? ?OT Visit Diagnosis: Repeated falls (R29.6)  ?              ?Time: 6962-9528 ?OT Time Calculation (min): 22 min ?Charges:  OT General Charges ?$OT Visit: 1 Visit ?OT Evaluation ?$OT Eval Low Complexity: 1 Low ? ?Limmie Patricia, OTR/L,CBIS  ?[phone] ? ? ?[name], [name] ?10/06/2021, 9:37 AM ?

## 2021-10-06 NOTE — ED Triage Notes (Signed)
Left side tingling since 1030pm. Says it was sudden. She was watching tv when it started. Is not on blood thinners. Called ems but did not ride with them.  ?

## 2021-10-06 NOTE — Assessment & Plan Note (Addendum)
See pics below ?Not infected on exam ?--defer antibiotics for now ?--wound care consult>>Cleanse the wound to the LLE with NS. Pat dry then apply a nickel thick layer of MediHoney directly to the wound, Cover with a foam dressing. Change dressing daily.  ?? ?

## 2021-10-06 NOTE — Evaluation (Signed)
Physical Therapy Evaluation ?Patient Details ?Name: Mckenzie [name] ?MRN: [id number] ?DOB: [birth year] ?Today's Date: 10/06/2021 ? ?History of Present Illness ? Mckenzie [name] is a 86 y.o. female with medical history significant of hyperlipidemia, GERD, hypertension, hypothyroidism, CAD s/p stent placement who presents to the emergency department due to sudden onset of tingling sensation in the left arm and left leg about 2 hours prior to arrival to the ED.  Last well-known time was 10:30 PM yesterday.  Patient complained of pins-and-needles sensation in left arm and leg which started while watching TV at home.  It was associated with some mild sensory deficits as well as some left leg weakness.  She denies fever, chills, chest pain, shortness of breath, headache, speech or vision changes. ?  ?Clinical Impression ? Patient functioning near baseline for functional mobility and gait demonstrating good return for bed mobility, transfers and ambulation in room/hallways without loss of balance, has decreased left ankle dorsiflexion that may be baseline.  Patient tolerated sitting up in chair after therapy.  Patient will benefit from continued skilled physical therapy in hospital and recommended venue below to increase strength, balance, endurance for safe ADLs and gait.  ? ?   ?   ? ?Recommendations for follow up therapy are one component of a multi-disciplinary discharge planning process, led by the attending physician.  Recommendations may be updated based on patient status, additional functional criteria and insurance authorization. ? ?Follow Up Recommendations Home health PT ? ?  ?Assistance Recommended at Discharge Set up Supervision/Assistance  ?Patient can return home with the following ? A little help with walking and/or transfers;A little help with bathing/dressing/bathroom;Help with stairs or ramp for entrance;Assistance with cooking/housework ? ?  ?Equipment Recommendations None recommended by PT   ?Recommendations for Other Services ?    ?  ?Functional Status Assessment Patient has had a recent decline in their functional status and/or demonstrates limited ability to make significant improvements in function in a reasonable and predictable amount of time  ? ?  ?Precautions / Restrictions Precautions ?Precautions: Fall ?Restrictions ?Weight Bearing Restrictions: No  ? ?  ? ?Mobility ? Bed Mobility ?Overal bed mobility: Independent ?  ?  ?  ?  ?  ?  ?  ?  ? ?Transfers ?Overall transfer level: Modified independent ?Equipment used: Rolling walker (2 wheels) ?Transfers: Bed to chair/wheelchair/BSC, Sit to/from Stand ?Sit to Stand: Modified independent (Device/Increase time) ?  ?Step pivot transfers: Modified independent (Device/Increase time) ?  ?  ?  ?General transfer comment: as per OT notes ?  ? ?Ambulation/Gait ?Ambulation/Gait assistance: Supervision, Modified independent (Device/Increase time) ?Gait Distance (Feet): 100 Feet ?  ?Gait Pattern/deviations: Decreased step length - right, Decreased step length - left, Decreased stride length, Decreased dorsiflexion - left ?Gait velocity: decreased ?  ?  ?General Gait Details: slightly labored cadence with fair/good return for ambulation in room, hallways without loss of balance, decreased left ankle dorsiflexon, limited heel to toe stepping LLE ? ?Stairs ?  ?  ?  ?  ?  ? ?Wheelchair Mobility ?  ? ?Modified Rankin (Stroke Patients Only) ?  ? ?  ? ?Balance Overall balance assessment: Needs assistance ?Sitting-balance support: Feet supported, No upper extremity supported ?Sitting balance-Leahy Scale: Good ?Sitting balance - Comments: seated at EOB ?  ?Standing balance support: During functional activity, Bilateral upper extremity supported ?Standing balance-Leahy Scale: Fair ?Standing balance comment: fair/good using RW ?  ?  ?  ?  ?  ?  ?  ?  ?  ?  ?  ?   ? ? ? ?  Pertinent Vitals/Pain Pain Assessment ?Pain Assessment: 0-10 ?Pain Score: 9  ?Pain Descriptors /  Indicators: Tingling, Numbness ?Pain Intervention(s): Limited activity within patient's tolerance, Monitored during session  ? ? ?Home Living Family/patient expects to be discharged to:: Private residence ?Living Arrangements: Spouse/significant other ?Available Help at Discharge: Family;Available 24 hours/day ?Type of Home: House ?Home Access: Stairs to enter ?  ?Entrance Stairs-Number of Steps: 1 ?  ?Home Layout: One level ?Home Equipment: Rolling Walker (2 wheels);Grab bars - tub/shower ?Additional Comments: reports that she started to use the walker since last year due to imbalance.  ?  ?Prior Function Prior Level of Function : Driving;Independent/Modified Independent ?  ?  ?  ?  ?  ?  ?Mobility Comments: household and short distanced community ambulator ?ADLs Comments: Spouse assists with lower body ADL and IADL a needed. ?  ? ? ?Hand Dominance  ? Dominant Hand: Right ? ?  ?Extremity/Trunk Assessment  ? Upper Extremity Assessment ?Upper Extremity Assessment: Defer to OT evaluation ?  ? ?Lower Extremity Assessment ?Lower Extremity Assessment: Overall WFL for tasks assessed;RLE deficits/detail;LLE deficits/detail ?RLE Deficits / Details: grossly 4+/5 ?RLE Sensation: WNL ?RLE Coordination: WNL ?LLE Deficits / Details: grossly 4/5 except ankle dorsiflexion -3/5, hip flexion -4/5 ?LLE Sensation: decreased light touch ?LLE Coordination: decreased fine motor ?  ? ?Cervical / Trunk Assessment ?Cervical / Trunk Assessment: Normal  ?Communication  ? Communication: No difficulties;Other (comment)  ?Cognition Arousal/Alertness: Awake/alert ?Behavior During Therapy: [facility] for tasks assessed/performed ?Overall Cognitive Status: Within Functional Limits for tasks assessed ?  ?  ?  ?  ?  ?  ?  ?  ?  ?  ?  ?  ?  ?  ?  ?  ?  ?  ?  ? ?  ?General Comments   ? ?  ?Exercises    ? ?Assessment/Plan  ?  ?PT Assessment Patient needs continued PT services  ?PT Problem List Decreased strength;Decreased activity tolerance;Decreased  balance;Decreased mobility ? ?   ?  ?PT Treatment Interventions DME instruction;Gait training;Stair training;Functional mobility training;Therapeutic activities;Therapeutic exercise;Patient/family education;Balance training   ? ?PT Goals (Current goals can be found in the Care Plan section)  ?Acute Rehab PT Goals ?Patient Stated Goal: return home with family to assist ?PT Goal Formulation: With patient ?Time For Goal Achievement: 10/09/21 ?Potential to Achieve Goals: Good ? ?  ?Frequency Min 3X/week ?  ? ? ?Co-evaluation PT/OT/SLP Co-Evaluation/Treatment: Yes ?Reason for Co-Treatment: To address functional/ADL transfers ?PT goals addressed during session: Mobility/safety with mobility;Balance;Proper use of DME ?  ?  ? ? ?  ?AM-PAC PT "6 Clicks" Mobility  ?Outcome Measure Help needed turning from your back to your side while in a flat bed without using bedrails?: None ?Help needed moving from lying on your back to sitting on the side of a flat bed without using bedrails?: None ?Help needed moving to and from a bed to a chair (including a wheelchair)?: A Little ?Help needed standing up from a chair using your arms (e.g., wheelchair or bedside chair)?: A Little ?Help needed to walk in hospital room?: A Little ?Help needed climbing 3-5 steps with a railing? : A Little ?6 Click Score: 20 ? ?  ?End of Session   ?Activity Tolerance: Patient tolerated treatment well;Patient limited by fatigue ?Patient left: in chair;with call bell/phone within reach ?Nurse Communication: Mobility status ?PT Visit Diagnosis: Unsteadiness on feet (R26.81);Other abnormalities of gait and mobility (R26.89);Muscle weakness (generalized) (M62.81) ?  ? ?Time: 9381-0175 ?PT Time Calculation (min) (ACUTE ONLY):  20 min ? ? ?Charges:   PT Evaluation ?$PT Eval Moderate Complexity: 1 Mod ?PT Treatments ?$Therapeutic Activity: 8-22 mins ?  ?   ? ? ?3:04 PM, 10/06/21 ?Ocie BobJames Maesyn Frisinger, MPT ?Physical Therapist with [place] ?[facility] ?[phone]  [phone] office ?[phone] mobile phone ? ? ?

## 2021-10-06 NOTE — Progress Notes (Signed)
*  PRELIMINARY RESULTS* ?Echocardiogram ?2D Echocardiogram has been performed. ? ?Carolyne Fiscal ?10/06/2021, 11:32 AM ?

## 2021-10-06 NOTE — Assessment & Plan Note (Signed)
Personally reviewed chest x-ray-RUL>LUL opacities ?PCT 0.26 ?Patient does not have any leukocytosis, tachypnea, fever, or hypoxia ?-Defer antibiotics for now ?

## 2021-10-06 NOTE — Progress Notes (Signed)
0050 call time ?6962 beeper time ?9528 exam started ?4132 exam finished ?0057 images sent to soc ?0057 exam completed in epic ?0058 [place] radiology called ?

## 2021-10-06 NOTE — Progress Notes (Signed)
When asked, still experiencing tingling in left extremities but has no stroke deficits with strong grips, no drift or ataxia.  Clear speech, swallowing with no difficulty and alert and oriented x 4.  Sitting up in chair after walking with PT, talking with family on telephone.   ?

## 2021-10-06 NOTE — Assessment & Plan Note (Signed)
Holding benazepril and metoprolol tartrate to allow for permissive hypertension ?Hydralazine prn SBP>220 ?

## 2021-10-06 NOTE — ED Notes (Signed)
[facility] paged @ [phone] ?

## 2021-10-06 NOTE — Progress Notes (Signed)
Assessment for stroke continued to show no deficit except for left ext tingling. Foley leg bag placed  for transport home.  Discharge instructions reviewed , IV removed and scripts sent to [facility]. Transported by WC to entrance and husband to drive home.   ?

## 2021-10-07 ENCOUNTER — Telehealth: Payer: Self-pay

## 2021-10-07 LAB — URINE CULTURE: Special Requests: NORMAL

## 2021-10-07 NOTE — Telephone Encounter (Signed)
Successful telephone encounter to patient to request manual transmission to assess for AF episodes on PPM. Unfortunately patient could not follow verbal instructions on sending transmission. States spouse will be available in the am for assistance. Of note patient's next remote transmission is 10/20/21. Will attempt to contact spouse in the am.  ?

## 2021-10-07 NOTE — Telephone Encounter (Signed)
-----   Message from Will Jorja Loa, MD sent at 10/06/2021  5:00 PM EDT ----- ?Have her send in a remote to look for AF. Thanks. ?----- Message ----- ?From: Catarina Hartshorn, MD ?Sent: 10/06/2021   4:48 PM EDT ?To: Will Jorja Loa, MD ? ?Good afternoon sir, ? ?I am discharging this patient after treatment for acute ischemic stroke.  Neurology requested 30-day monitor post discharge, but patient already has PPM for Mobitz type 2. ?Can you please interrogate the PPM at some point to ensure there is no afib/aflutter? ? ?Thank you, ? ?Onalee Hua ? ? ?

## 2021-10-08 DIAGNOSIS — Z9689 Presence of other specified functional implants: Secondary | ICD-10-CM | POA: Diagnosis not present

## 2021-10-08 DIAGNOSIS — M1612 Unilateral primary osteoarthritis, left hip: Secondary | ICD-10-CM | POA: Diagnosis not present

## 2021-10-08 DIAGNOSIS — Z79891 Long term (current) use of opiate analgesic: Secondary | ICD-10-CM | POA: Diagnosis not present

## 2021-10-08 DIAGNOSIS — G894 Chronic pain syndrome: Secondary | ICD-10-CM | POA: Diagnosis not present

## 2021-10-08 DIAGNOSIS — K5903 Drug induced constipation: Secondary | ICD-10-CM | POA: Diagnosis not present

## 2021-10-08 NOTE — Telephone Encounter (Signed)
No transmission has been received. Attempted to contact to send. No answer, LMTCB. ?

## 2021-10-09 NOTE — Telephone Encounter (Signed)
Transmission reviewed for cryptogenic stroke/AF episodes. NO AT/AF episodes noted. 25 AMS for PACs. Longest episode 39 min.  ? ? ? ? ? ?

## 2021-10-09 NOTE — Telephone Encounter (Signed)
I asked the patient to call back when her husband is home. She is very confused. We may just need to wait on scheduled remote. ?

## 2021-10-10 DIAGNOSIS — Z20822 Contact with and (suspected) exposure to covid-19: Secondary | ICD-10-CM | POA: Diagnosis not present

## 2021-10-13 DIAGNOSIS — I10 Essential (primary) hypertension: Secondary | ICD-10-CM | POA: Diagnosis not present

## 2021-10-13 DIAGNOSIS — S81802A Unspecified open wound, left lower leg, initial encounter: Secondary | ICD-10-CM | POA: Diagnosis not present

## 2021-10-13 DIAGNOSIS — Z8673 Personal history of transient ischemic attack (TIA), and cerebral infarction without residual deficits: Secondary | ICD-10-CM | POA: Diagnosis not present

## 2021-10-13 DIAGNOSIS — I2510 Atherosclerotic heart disease of native coronary artery without angina pectoris: Secondary | ICD-10-CM | POA: Diagnosis not present

## 2021-10-13 DIAGNOSIS — K219 Gastro-esophageal reflux disease without esophagitis: Secondary | ICD-10-CM | POA: Diagnosis not present

## 2021-10-13 DIAGNOSIS — Z681 Body mass index (BMI) 19 or less, adult: Secondary | ICD-10-CM | POA: Diagnosis not present

## 2021-10-13 DIAGNOSIS — R8281 Pyuria: Secondary | ICD-10-CM | POA: Diagnosis not present

## 2021-10-15 DIAGNOSIS — N323 Diverticulum of bladder: Secondary | ICD-10-CM | POA: Diagnosis not present

## 2021-10-15 DIAGNOSIS — N320 Bladder-neck obstruction: Secondary | ICD-10-CM | POA: Diagnosis not present

## 2021-10-15 DIAGNOSIS — M5416 Radiculopathy, lumbar region: Secondary | ICD-10-CM | POA: Diagnosis not present

## 2021-10-15 DIAGNOSIS — M810 Age-related osteoporosis without current pathological fracture: Secondary | ICD-10-CM | POA: Diagnosis not present

## 2021-10-15 DIAGNOSIS — R339 Retention of urine, unspecified: Secondary | ICD-10-CM | POA: Diagnosis not present

## 2021-10-15 DIAGNOSIS — Z466 Encounter for fitting and adjustment of urinary device: Secondary | ICD-10-CM | POA: Diagnosis not present

## 2021-10-16 DIAGNOSIS — Z20822 Contact with and (suspected) exposure to covid-19: Secondary | ICD-10-CM | POA: Diagnosis not present

## 2021-10-18 DIAGNOSIS — I10 Essential (primary) hypertension: Secondary | ICD-10-CM | POA: Diagnosis not present

## 2021-10-18 DIAGNOSIS — E785 Hyperlipidemia, unspecified: Secondary | ICD-10-CM | POA: Diagnosis not present

## 2021-10-18 DIAGNOSIS — Z9181 History of falling: Secondary | ICD-10-CM | POA: Diagnosis not present

## 2021-10-18 DIAGNOSIS — Z792 Long term (current) use of antibiotics: Secondary | ICD-10-CM | POA: Diagnosis not present

## 2021-10-18 DIAGNOSIS — I252 Old myocardial infarction: Secondary | ICD-10-CM | POA: Diagnosis not present

## 2021-10-18 DIAGNOSIS — K219 Gastro-esophageal reflux disease without esophagitis: Secondary | ICD-10-CM | POA: Diagnosis not present

## 2021-10-18 DIAGNOSIS — J449 Chronic obstructive pulmonary disease, unspecified: Secondary | ICD-10-CM | POA: Diagnosis not present

## 2021-10-18 DIAGNOSIS — Z7902 Long term (current) use of antithrombotics/antiplatelets: Secondary | ICD-10-CM | POA: Diagnosis not present

## 2021-10-18 DIAGNOSIS — K5900 Constipation, unspecified: Secondary | ICD-10-CM | POA: Diagnosis not present

## 2021-10-18 DIAGNOSIS — Z95810 Presence of automatic (implantable) cardiac defibrillator: Secondary | ICD-10-CM | POA: Diagnosis not present

## 2021-10-18 DIAGNOSIS — E079 Disorder of thyroid, unspecified: Secondary | ICD-10-CM | POA: Diagnosis not present

## 2021-10-18 DIAGNOSIS — R339 Retention of urine, unspecified: Secondary | ICD-10-CM | POA: Diagnosis not present

## 2021-10-18 DIAGNOSIS — M5416 Radiculopathy, lumbar region: Secondary | ICD-10-CM | POA: Diagnosis not present

## 2021-10-18 DIAGNOSIS — M810 Age-related osteoporosis without current pathological fracture: Secondary | ICD-10-CM | POA: Diagnosis not present

## 2021-10-18 DIAGNOSIS — Z20822 Contact with and (suspected) exposure to covid-19: Secondary | ICD-10-CM | POA: Diagnosis not present

## 2021-10-18 DIAGNOSIS — Z466 Encounter for fitting and adjustment of urinary device: Secondary | ICD-10-CM | POA: Diagnosis not present

## 2021-10-18 DIAGNOSIS — N320 Bladder-neck obstruction: Secondary | ICD-10-CM | POA: Diagnosis not present

## 2021-10-18 DIAGNOSIS — N323 Diverticulum of bladder: Secondary | ICD-10-CM | POA: Diagnosis not present

## 2021-10-21 DIAGNOSIS — N3020 Other chronic cystitis without hematuria: Secondary | ICD-10-CM | POA: Diagnosis not present

## 2021-10-21 DIAGNOSIS — R3914 Feeling of incomplete bladder emptying: Secondary | ICD-10-CM | POA: Diagnosis not present

## 2021-10-22 ENCOUNTER — Ambulatory Visit (INDEPENDENT_AMBULATORY_CARE_PROVIDER_SITE_OTHER): Payer: Medicare Other

## 2021-10-22 DIAGNOSIS — I441 Atrioventricular block, second degree: Secondary | ICD-10-CM | POA: Diagnosis not present

## 2021-10-23 LAB — CUP PACEART REMOTE DEVICE CHECK
Battery Remaining Longevity: 92 mo
Battery Remaining Percentage: 94 %
Battery Voltage: 2.99 V
Brady Statistic AP VP Percent: 7.4 %
Brady Statistic AP VS Percent: 1 %
Brady Statistic AS VP Percent: 77 %
Brady Statistic AS VS Percent: 15 %
Brady Statistic RA Percent Paced: 7.6 %
Brady Statistic RV Percent Paced: 84 %
Date Time Interrogation Session: 20230510020014
Implantable Lead Implant Date: 20221109
Implantable Lead Implant Date: 20221109
Implantable Lead Location: 753859
Implantable Lead Location: 753860
Implantable Pulse Generator Implant Date: 20221109
Lead Channel Impedance Value: 380 Ohm
Lead Channel Impedance Value: 410 Ohm
Lead Channel Pacing Threshold Amplitude: 0.75 V
Lead Channel Pacing Threshold Amplitude: 1 V
Lead Channel Pacing Threshold Pulse Width: 0.5 ms
Lead Channel Pacing Threshold Pulse Width: 0.5 ms
Lead Channel Sensing Intrinsic Amplitude: 5 mV
Lead Channel Sensing Intrinsic Amplitude: 8.3 mV
Lead Channel Setting Pacing Amplitude: 2 V
Lead Channel Setting Pacing Amplitude: 2.5 V
Lead Channel Setting Pacing Pulse Width: 0.5 ms
Lead Channel Setting Sensing Sensitivity: 2 mV
Pulse Gen Model: 2272
Pulse Gen Serial Number: 3956509

## 2021-11-03 NOTE — Progress Notes (Signed)
Remote pacemaker transmission.   

## 2021-11-05 DIAGNOSIS — K219 Gastro-esophageal reflux disease without esophagitis: Secondary | ICD-10-CM | POA: Diagnosis not present

## 2021-11-05 DIAGNOSIS — I10 Essential (primary) hypertension: Secondary | ICD-10-CM | POA: Diagnosis not present

## 2021-11-05 DIAGNOSIS — Z8673 Personal history of transient ischemic attack (TIA), and cerebral infarction without residual deficits: Secondary | ICD-10-CM | POA: Diagnosis not present

## 2021-11-05 DIAGNOSIS — R8281 Pyuria: Secondary | ICD-10-CM | POA: Diagnosis not present

## 2021-11-05 DIAGNOSIS — S81802A Unspecified open wound, left lower leg, initial encounter: Secondary | ICD-10-CM | POA: Diagnosis not present

## 2021-11-05 DIAGNOSIS — Z681 Body mass index (BMI) 19 or less, adult: Secondary | ICD-10-CM | POA: Diagnosis not present

## 2021-11-05 DIAGNOSIS — I2510 Atherosclerotic heart disease of native coronary artery without angina pectoris: Secondary | ICD-10-CM | POA: Diagnosis not present

## 2021-11-06 DIAGNOSIS — M5416 Radiculopathy, lumbar region: Secondary | ICD-10-CM | POA: Diagnosis not present

## 2021-11-06 DIAGNOSIS — M810 Age-related osteoporosis without current pathological fracture: Secondary | ICD-10-CM | POA: Diagnosis not present

## 2021-11-06 DIAGNOSIS — N323 Diverticulum of bladder: Secondary | ICD-10-CM | POA: Diagnosis not present

## 2021-11-06 DIAGNOSIS — Z466 Encounter for fitting and adjustment of urinary device: Secondary | ICD-10-CM | POA: Diagnosis not present

## 2021-11-06 DIAGNOSIS — R339 Retention of urine, unspecified: Secondary | ICD-10-CM | POA: Diagnosis not present

## 2021-11-06 DIAGNOSIS — N320 Bladder-neck obstruction: Secondary | ICD-10-CM | POA: Diagnosis not present

## 2021-11-12 DIAGNOSIS — J441 Chronic obstructive pulmonary disease with (acute) exacerbation: Secondary | ICD-10-CM | POA: Diagnosis not present

## 2021-11-12 DIAGNOSIS — Z9689 Presence of other specified functional implants: Secondary | ICD-10-CM | POA: Diagnosis not present

## 2021-11-12 DIAGNOSIS — Z79891 Long term (current) use of opiate analgesic: Secondary | ICD-10-CM | POA: Diagnosis not present

## 2021-11-12 DIAGNOSIS — K5903 Drug induced constipation: Secondary | ICD-10-CM | POA: Diagnosis not present

## 2021-11-12 DIAGNOSIS — G894 Chronic pain syndrome: Secondary | ICD-10-CM | POA: Diagnosis not present

## 2021-11-12 DIAGNOSIS — M961 Postlaminectomy syndrome, not elsewhere classified: Secondary | ICD-10-CM | POA: Diagnosis not present

## 2021-11-12 DIAGNOSIS — I10 Essential (primary) hypertension: Secondary | ICD-10-CM | POA: Diagnosis not present

## 2021-11-12 DIAGNOSIS — M1612 Unilateral primary osteoarthritis, left hip: Secondary | ICD-10-CM | POA: Diagnosis not present

## 2021-11-12 DIAGNOSIS — M47812 Spondylosis without myelopathy or radiculopathy, cervical region: Secondary | ICD-10-CM | POA: Diagnosis not present

## 2021-11-12 DIAGNOSIS — I872 Venous insufficiency (chronic) (peripheral): Secondary | ICD-10-CM | POA: Diagnosis not present

## 2021-11-17 ENCOUNTER — Encounter: Payer: Medicare Other | Admitting: Orthopedic Surgery

## 2021-11-17 DIAGNOSIS — Z792 Long term (current) use of antibiotics: Secondary | ICD-10-CM | POA: Diagnosis not present

## 2021-11-17 DIAGNOSIS — Z466 Encounter for fitting and adjustment of urinary device: Secondary | ICD-10-CM | POA: Diagnosis not present

## 2021-11-17 DIAGNOSIS — Z95810 Presence of automatic (implantable) cardiac defibrillator: Secondary | ICD-10-CM | POA: Diagnosis not present

## 2021-11-17 DIAGNOSIS — E079 Disorder of thyroid, unspecified: Secondary | ICD-10-CM | POA: Diagnosis not present

## 2021-11-17 DIAGNOSIS — Z7902 Long term (current) use of antithrombotics/antiplatelets: Secondary | ICD-10-CM | POA: Diagnosis not present

## 2021-11-17 DIAGNOSIS — J449 Chronic obstructive pulmonary disease, unspecified: Secondary | ICD-10-CM | POA: Diagnosis not present

## 2021-11-17 DIAGNOSIS — N320 Bladder-neck obstruction: Secondary | ICD-10-CM | POA: Diagnosis not present

## 2021-11-17 DIAGNOSIS — Z9181 History of falling: Secondary | ICD-10-CM | POA: Diagnosis not present

## 2021-11-17 DIAGNOSIS — M5416 Radiculopathy, lumbar region: Secondary | ICD-10-CM | POA: Diagnosis not present

## 2021-11-17 DIAGNOSIS — R339 Retention of urine, unspecified: Secondary | ICD-10-CM | POA: Diagnosis not present

## 2021-11-17 DIAGNOSIS — N323 Diverticulum of bladder: Secondary | ICD-10-CM | POA: Diagnosis not present

## 2021-11-17 DIAGNOSIS — M810 Age-related osteoporosis without current pathological fracture: Secondary | ICD-10-CM | POA: Diagnosis not present

## 2021-11-17 DIAGNOSIS — I10 Essential (primary) hypertension: Secondary | ICD-10-CM | POA: Diagnosis not present

## 2021-11-17 DIAGNOSIS — E785 Hyperlipidemia, unspecified: Secondary | ICD-10-CM | POA: Diagnosis not present

## 2021-11-17 DIAGNOSIS — K5900 Constipation, unspecified: Secondary | ICD-10-CM | POA: Diagnosis not present

## 2021-11-17 DIAGNOSIS — K219 Gastro-esophageal reflux disease without esophagitis: Secondary | ICD-10-CM | POA: Diagnosis not present

## 2021-11-17 DIAGNOSIS — I252 Old myocardial infarction: Secondary | ICD-10-CM | POA: Diagnosis not present

## 2021-11-30 DIAGNOSIS — Z466 Encounter for fitting and adjustment of urinary device: Secondary | ICD-10-CM | POA: Diagnosis not present

## 2021-11-30 DIAGNOSIS — N320 Bladder-neck obstruction: Secondary | ICD-10-CM | POA: Diagnosis not present

## 2021-11-30 DIAGNOSIS — M810 Age-related osteoporosis without current pathological fracture: Secondary | ICD-10-CM | POA: Diagnosis not present

## 2021-11-30 DIAGNOSIS — N323 Diverticulum of bladder: Secondary | ICD-10-CM | POA: Diagnosis not present

## 2021-11-30 DIAGNOSIS — R339 Retention of urine, unspecified: Secondary | ICD-10-CM | POA: Diagnosis not present

## 2021-11-30 DIAGNOSIS — M5416 Radiculopathy, lumbar region: Secondary | ICD-10-CM | POA: Diagnosis not present

## 2021-12-08 DIAGNOSIS — Z23 Encounter for immunization: Secondary | ICD-10-CM | POA: Diagnosis not present

## 2021-12-08 DIAGNOSIS — R030 Elevated blood-pressure reading, without diagnosis of hypertension: Secondary | ICD-10-CM | POA: Diagnosis not present

## 2021-12-08 DIAGNOSIS — S80811A Abrasion, right lower leg, initial encounter: Secondary | ICD-10-CM | POA: Diagnosis not present

## 2021-12-08 DIAGNOSIS — Z681 Body mass index (BMI) 19 or less, adult: Secondary | ICD-10-CM | POA: Diagnosis not present

## 2021-12-08 DIAGNOSIS — L0390 Cellulitis, unspecified: Secondary | ICD-10-CM | POA: Diagnosis not present

## 2021-12-09 DIAGNOSIS — M961 Postlaminectomy syndrome, not elsewhere classified: Secondary | ICD-10-CM | POA: Diagnosis not present

## 2021-12-09 DIAGNOSIS — G894 Chronic pain syndrome: Secondary | ICD-10-CM | POA: Diagnosis not present

## 2021-12-09 DIAGNOSIS — Z79891 Long term (current) use of opiate analgesic: Secondary | ICD-10-CM | POA: Diagnosis not present

## 2021-12-09 DIAGNOSIS — Z9689 Presence of other specified functional implants: Secondary | ICD-10-CM | POA: Diagnosis not present

## 2021-12-09 DIAGNOSIS — M47812 Spondylosis without myelopathy or radiculopathy, cervical region: Secondary | ICD-10-CM | POA: Diagnosis not present

## 2021-12-09 DIAGNOSIS — M1612 Unilateral primary osteoarthritis, left hip: Secondary | ICD-10-CM | POA: Diagnosis not present

## 2021-12-09 DIAGNOSIS — K5903 Drug induced constipation: Secondary | ICD-10-CM | POA: Diagnosis not present

## 2021-12-12 DIAGNOSIS — J441 Chronic obstructive pulmonary disease with (acute) exacerbation: Secondary | ICD-10-CM | POA: Diagnosis not present

## 2021-12-12 DIAGNOSIS — I872 Venous insufficiency (chronic) (peripheral): Secondary | ICD-10-CM | POA: Diagnosis not present

## 2021-12-15 DIAGNOSIS — Z466 Encounter for fitting and adjustment of urinary device: Secondary | ICD-10-CM | POA: Diagnosis not present

## 2021-12-15 DIAGNOSIS — R339 Retention of urine, unspecified: Secondary | ICD-10-CM | POA: Diagnosis not present

## 2021-12-15 DIAGNOSIS — M810 Age-related osteoporosis without current pathological fracture: Secondary | ICD-10-CM | POA: Diagnosis not present

## 2021-12-15 DIAGNOSIS — M5416 Radiculopathy, lumbar region: Secondary | ICD-10-CM | POA: Diagnosis not present

## 2021-12-15 DIAGNOSIS — N320 Bladder-neck obstruction: Secondary | ICD-10-CM | POA: Diagnosis not present

## 2021-12-15 DIAGNOSIS — N323 Diverticulum of bladder: Secondary | ICD-10-CM | POA: Diagnosis not present

## 2021-12-17 DIAGNOSIS — S8991XD Unspecified injury of right lower leg, subsequent encounter: Secondary | ICD-10-CM | POA: Diagnosis not present

## 2021-12-17 DIAGNOSIS — E079 Disorder of thyroid, unspecified: Secondary | ICD-10-CM | POA: Diagnosis not present

## 2021-12-17 DIAGNOSIS — E785 Hyperlipidemia, unspecified: Secondary | ICD-10-CM | POA: Diagnosis not present

## 2021-12-17 DIAGNOSIS — N323 Diverticulum of bladder: Secondary | ICD-10-CM | POA: Diagnosis not present

## 2021-12-17 DIAGNOSIS — J449 Chronic obstructive pulmonary disease, unspecified: Secondary | ICD-10-CM | POA: Diagnosis not present

## 2021-12-17 DIAGNOSIS — K219 Gastro-esophageal reflux disease without esophagitis: Secondary | ICD-10-CM | POA: Diagnosis not present

## 2021-12-17 DIAGNOSIS — M810 Age-related osteoporosis without current pathological fracture: Secondary | ICD-10-CM | POA: Diagnosis not present

## 2021-12-17 DIAGNOSIS — Z792 Long term (current) use of antibiotics: Secondary | ICD-10-CM | POA: Diagnosis not present

## 2021-12-17 DIAGNOSIS — I252 Old myocardial infarction: Secondary | ICD-10-CM | POA: Diagnosis not present

## 2021-12-17 DIAGNOSIS — Z466 Encounter for fitting and adjustment of urinary device: Secondary | ICD-10-CM | POA: Diagnosis not present

## 2021-12-17 DIAGNOSIS — M5416 Radiculopathy, lumbar region: Secondary | ICD-10-CM | POA: Diagnosis not present

## 2021-12-17 DIAGNOSIS — K5900 Constipation, unspecified: Secondary | ICD-10-CM | POA: Diagnosis not present

## 2021-12-17 DIAGNOSIS — I10 Essential (primary) hypertension: Secondary | ICD-10-CM | POA: Diagnosis not present

## 2021-12-17 DIAGNOSIS — Z95810 Presence of automatic (implantable) cardiac defibrillator: Secondary | ICD-10-CM | POA: Diagnosis not present

## 2021-12-17 DIAGNOSIS — Z9181 History of falling: Secondary | ICD-10-CM | POA: Diagnosis not present

## 2021-12-17 DIAGNOSIS — R339 Retention of urine, unspecified: Secondary | ICD-10-CM | POA: Diagnosis not present

## 2021-12-17 DIAGNOSIS — N320 Bladder-neck obstruction: Secondary | ICD-10-CM | POA: Diagnosis not present

## 2021-12-25 DIAGNOSIS — S8991XD Unspecified injury of right lower leg, subsequent encounter: Secondary | ICD-10-CM | POA: Diagnosis not present

## 2021-12-25 DIAGNOSIS — M810 Age-related osteoporosis without current pathological fracture: Secondary | ICD-10-CM | POA: Diagnosis not present

## 2021-12-25 DIAGNOSIS — N320 Bladder-neck obstruction: Secondary | ICD-10-CM | POA: Diagnosis not present

## 2021-12-25 DIAGNOSIS — R339 Retention of urine, unspecified: Secondary | ICD-10-CM | POA: Diagnosis not present

## 2021-12-25 DIAGNOSIS — Z466 Encounter for fitting and adjustment of urinary device: Secondary | ICD-10-CM | POA: Diagnosis not present

## 2021-12-25 DIAGNOSIS — N323 Diverticulum of bladder: Secondary | ICD-10-CM | POA: Diagnosis not present

## 2021-12-27 DIAGNOSIS — N320 Bladder-neck obstruction: Secondary | ICD-10-CM | POA: Diagnosis not present

## 2021-12-27 DIAGNOSIS — S8991XD Unspecified injury of right lower leg, subsequent encounter: Secondary | ICD-10-CM | POA: Diagnosis not present

## 2021-12-27 DIAGNOSIS — Z466 Encounter for fitting and adjustment of urinary device: Secondary | ICD-10-CM | POA: Diagnosis not present

## 2021-12-27 DIAGNOSIS — M810 Age-related osteoporosis without current pathological fracture: Secondary | ICD-10-CM | POA: Diagnosis not present

## 2021-12-27 DIAGNOSIS — N323 Diverticulum of bladder: Secondary | ICD-10-CM | POA: Diagnosis not present

## 2021-12-27 DIAGNOSIS — R339 Retention of urine, unspecified: Secondary | ICD-10-CM | POA: Diagnosis not present

## 2021-12-30 DIAGNOSIS — S8991XD Unspecified injury of right lower leg, subsequent encounter: Secondary | ICD-10-CM | POA: Diagnosis not present

## 2021-12-30 DIAGNOSIS — M810 Age-related osteoporosis without current pathological fracture: Secondary | ICD-10-CM | POA: Diagnosis not present

## 2021-12-30 DIAGNOSIS — N323 Diverticulum of bladder: Secondary | ICD-10-CM | POA: Diagnosis not present

## 2021-12-30 DIAGNOSIS — R339 Retention of urine, unspecified: Secondary | ICD-10-CM | POA: Diagnosis not present

## 2021-12-30 DIAGNOSIS — Z466 Encounter for fitting and adjustment of urinary device: Secondary | ICD-10-CM | POA: Diagnosis not present

## 2021-12-30 DIAGNOSIS — N320 Bladder-neck obstruction: Secondary | ICD-10-CM | POA: Diagnosis not present

## 2022-01-05 DIAGNOSIS — M961 Postlaminectomy syndrome, not elsewhere classified: Secondary | ICD-10-CM | POA: Diagnosis not present

## 2022-01-05 DIAGNOSIS — Z79891 Long term (current) use of opiate analgesic: Secondary | ICD-10-CM | POA: Diagnosis not present

## 2022-01-05 DIAGNOSIS — G894 Chronic pain syndrome: Secondary | ICD-10-CM | POA: Diagnosis not present

## 2022-01-05 DIAGNOSIS — K5903 Drug induced constipation: Secondary | ICD-10-CM | POA: Diagnosis not present

## 2022-01-05 DIAGNOSIS — M1612 Unilateral primary osteoarthritis, left hip: Secondary | ICD-10-CM | POA: Diagnosis not present

## 2022-01-06 DIAGNOSIS — Z7982 Long term (current) use of aspirin: Secondary | ICD-10-CM | POA: Diagnosis not present

## 2022-01-06 DIAGNOSIS — S91001D Unspecified open wound, right ankle, subsequent encounter: Secondary | ICD-10-CM | POA: Diagnosis not present

## 2022-01-06 DIAGNOSIS — J449 Chronic obstructive pulmonary disease, unspecified: Secondary | ICD-10-CM | POA: Diagnosis not present

## 2022-01-06 DIAGNOSIS — I10 Essential (primary) hypertension: Secondary | ICD-10-CM | POA: Diagnosis not present

## 2022-01-06 DIAGNOSIS — M79661 Pain in right lower leg: Secondary | ICD-10-CM | POA: Diagnosis not present

## 2022-01-06 DIAGNOSIS — E7800 Pure hypercholesterolemia, unspecified: Secondary | ICD-10-CM | POA: Diagnosis not present

## 2022-01-06 DIAGNOSIS — Z7902 Long term (current) use of antithrombotics/antiplatelets: Secondary | ICD-10-CM | POA: Diagnosis not present

## 2022-01-06 DIAGNOSIS — M79605 Pain in left leg: Secondary | ICD-10-CM | POA: Diagnosis not present

## 2022-01-06 DIAGNOSIS — M7989 Other specified soft tissue disorders: Secondary | ICD-10-CM | POA: Diagnosis not present

## 2022-01-06 DIAGNOSIS — I2510 Atherosclerotic heart disease of native coronary artery without angina pectoris: Secondary | ICD-10-CM | POA: Diagnosis not present

## 2022-01-06 DIAGNOSIS — M79604 Pain in right leg: Secondary | ICD-10-CM | POA: Diagnosis not present

## 2022-01-06 DIAGNOSIS — Z87891 Personal history of nicotine dependence: Secondary | ICD-10-CM | POA: Diagnosis not present

## 2022-01-06 DIAGNOSIS — Z79899 Other long term (current) drug therapy: Secondary | ICD-10-CM | POA: Diagnosis not present

## 2022-01-06 DIAGNOSIS — S81801A Unspecified open wound, right lower leg, initial encounter: Secondary | ICD-10-CM | POA: Diagnosis not present

## 2022-01-06 DIAGNOSIS — Z881 Allergy status to other antibiotic agents status: Secondary | ICD-10-CM | POA: Diagnosis not present

## 2022-01-06 DIAGNOSIS — M810 Age-related osteoporosis without current pathological fracture: Secondary | ICD-10-CM | POA: Diagnosis not present

## 2022-01-06 DIAGNOSIS — M79662 Pain in left lower leg: Secondary | ICD-10-CM | POA: Diagnosis not present

## 2022-01-08 DIAGNOSIS — N320 Bladder-neck obstruction: Secondary | ICD-10-CM | POA: Diagnosis not present

## 2022-01-08 DIAGNOSIS — S8991XD Unspecified injury of right lower leg, subsequent encounter: Secondary | ICD-10-CM | POA: Diagnosis not present

## 2022-01-08 DIAGNOSIS — Z466 Encounter for fitting and adjustment of urinary device: Secondary | ICD-10-CM | POA: Diagnosis not present

## 2022-01-08 DIAGNOSIS — N323 Diverticulum of bladder: Secondary | ICD-10-CM | POA: Diagnosis not present

## 2022-01-08 DIAGNOSIS — M810 Age-related osteoporosis without current pathological fracture: Secondary | ICD-10-CM | POA: Diagnosis not present

## 2022-01-08 DIAGNOSIS — R339 Retention of urine, unspecified: Secondary | ICD-10-CM | POA: Diagnosis not present

## 2022-01-13 DIAGNOSIS — Z466 Encounter for fitting and adjustment of urinary device: Secondary | ICD-10-CM | POA: Diagnosis not present

## 2022-01-13 DIAGNOSIS — R339 Retention of urine, unspecified: Secondary | ICD-10-CM | POA: Diagnosis not present

## 2022-01-13 DIAGNOSIS — N320 Bladder-neck obstruction: Secondary | ICD-10-CM | POA: Diagnosis not present

## 2022-01-13 DIAGNOSIS — S8991XD Unspecified injury of right lower leg, subsequent encounter: Secondary | ICD-10-CM | POA: Diagnosis not present

## 2022-01-13 DIAGNOSIS — M810 Age-related osteoporosis without current pathological fracture: Secondary | ICD-10-CM | POA: Diagnosis not present

## 2022-01-13 DIAGNOSIS — H6123 Impacted cerumen, bilateral: Secondary | ICD-10-CM | POA: Diagnosis not present

## 2022-01-13 DIAGNOSIS — N323 Diverticulum of bladder: Secondary | ICD-10-CM | POA: Diagnosis not present

## 2022-01-16 DIAGNOSIS — E785 Hyperlipidemia, unspecified: Secondary | ICD-10-CM | POA: Diagnosis not present

## 2022-01-16 DIAGNOSIS — Z466 Encounter for fitting and adjustment of urinary device: Secondary | ICD-10-CM | POA: Diagnosis not present

## 2022-01-16 DIAGNOSIS — I252 Old myocardial infarction: Secondary | ICD-10-CM | POA: Diagnosis not present

## 2022-01-16 DIAGNOSIS — I10 Essential (primary) hypertension: Secondary | ICD-10-CM | POA: Diagnosis not present

## 2022-01-16 DIAGNOSIS — E079 Disorder of thyroid, unspecified: Secondary | ICD-10-CM | POA: Diagnosis not present

## 2022-01-16 DIAGNOSIS — Z95810 Presence of automatic (implantable) cardiac defibrillator: Secondary | ICD-10-CM | POA: Diagnosis not present

## 2022-01-16 DIAGNOSIS — N320 Bladder-neck obstruction: Secondary | ICD-10-CM | POA: Diagnosis not present

## 2022-01-16 DIAGNOSIS — J449 Chronic obstructive pulmonary disease, unspecified: Secondary | ICD-10-CM | POA: Diagnosis not present

## 2022-01-16 DIAGNOSIS — M5416 Radiculopathy, lumbar region: Secondary | ICD-10-CM | POA: Diagnosis not present

## 2022-01-16 DIAGNOSIS — R339 Retention of urine, unspecified: Secondary | ICD-10-CM | POA: Diagnosis not present

## 2022-01-16 DIAGNOSIS — K5900 Constipation, unspecified: Secondary | ICD-10-CM | POA: Diagnosis not present

## 2022-01-16 DIAGNOSIS — N323 Diverticulum of bladder: Secondary | ICD-10-CM | POA: Diagnosis not present

## 2022-01-16 DIAGNOSIS — K219 Gastro-esophageal reflux disease without esophagitis: Secondary | ICD-10-CM | POA: Diagnosis not present

## 2022-01-16 DIAGNOSIS — Z792 Long term (current) use of antibiotics: Secondary | ICD-10-CM | POA: Diagnosis not present

## 2022-01-16 DIAGNOSIS — M810 Age-related osteoporosis without current pathological fracture: Secondary | ICD-10-CM | POA: Diagnosis not present

## 2022-01-16 DIAGNOSIS — Z9181 History of falling: Secondary | ICD-10-CM | POA: Diagnosis not present

## 2022-01-16 DIAGNOSIS — S8991XD Unspecified injury of right lower leg, subsequent encounter: Secondary | ICD-10-CM | POA: Diagnosis not present

## 2022-01-21 ENCOUNTER — Ambulatory Visit (INDEPENDENT_AMBULATORY_CARE_PROVIDER_SITE_OTHER): Payer: Medicare Other

## 2022-01-21 DIAGNOSIS — I441 Atrioventricular block, second degree: Secondary | ICD-10-CM | POA: Diagnosis not present

## 2022-01-21 LAB — CUP PACEART REMOTE DEVICE CHECK
Battery Remaining Longevity: 94 mo
Battery Remaining Percentage: 92 %
Battery Voltage: 2.99 V
Brady Statistic AP VP Percent: 16 %
Brady Statistic AP VS Percent: 1.1 %
Brady Statistic AS VP Percent: 53 %
Brady Statistic AS VS Percent: 30 %
Brady Statistic RA Percent Paced: 17 %
Brady Statistic RV Percent Paced: 69 %
Date Time Interrogation Session: 20230809020015
Implantable Lead Implant Date: 20221109
Implantable Lead Implant Date: 20221109
Implantable Lead Location: 753859
Implantable Lead Location: 753860
Implantable Pulse Generator Implant Date: 20221109
Lead Channel Impedance Value: 380 Ohm
Lead Channel Impedance Value: 430 Ohm
Lead Channel Pacing Threshold Amplitude: 0.75 V
Lead Channel Pacing Threshold Amplitude: 1 V
Lead Channel Pacing Threshold Pulse Width: 0.5 ms
Lead Channel Pacing Threshold Pulse Width: 0.5 ms
Lead Channel Sensing Intrinsic Amplitude: 3.5 mV
Lead Channel Sensing Intrinsic Amplitude: 8.1 mV
Lead Channel Setting Pacing Amplitude: 2 V
Lead Channel Setting Pacing Amplitude: 2.5 V
Lead Channel Setting Pacing Pulse Width: 0.5 ms
Lead Channel Setting Sensing Sensitivity: 2 mV
Pulse Gen Model: 2272
Pulse Gen Serial Number: 3956509

## 2022-01-23 DIAGNOSIS — N323 Diverticulum of bladder: Secondary | ICD-10-CM | POA: Diagnosis not present

## 2022-01-23 DIAGNOSIS — M810 Age-related osteoporosis without current pathological fracture: Secondary | ICD-10-CM | POA: Diagnosis not present

## 2022-01-23 DIAGNOSIS — R339 Retention of urine, unspecified: Secondary | ICD-10-CM | POA: Diagnosis not present

## 2022-01-23 DIAGNOSIS — N320 Bladder-neck obstruction: Secondary | ICD-10-CM | POA: Diagnosis not present

## 2022-01-23 DIAGNOSIS — S8991XD Unspecified injury of right lower leg, subsequent encounter: Secondary | ICD-10-CM | POA: Diagnosis not present

## 2022-01-23 DIAGNOSIS — Z466 Encounter for fitting and adjustment of urinary device: Secondary | ICD-10-CM | POA: Diagnosis not present

## 2022-01-29 DIAGNOSIS — M810 Age-related osteoporosis without current pathological fracture: Secondary | ICD-10-CM | POA: Diagnosis not present

## 2022-01-29 DIAGNOSIS — R339 Retention of urine, unspecified: Secondary | ICD-10-CM | POA: Diagnosis not present

## 2022-01-29 DIAGNOSIS — N320 Bladder-neck obstruction: Secondary | ICD-10-CM | POA: Diagnosis not present

## 2022-01-29 DIAGNOSIS — N323 Diverticulum of bladder: Secondary | ICD-10-CM | POA: Diagnosis not present

## 2022-01-29 DIAGNOSIS — Z466 Encounter for fitting and adjustment of urinary device: Secondary | ICD-10-CM | POA: Diagnosis not present

## 2022-01-29 DIAGNOSIS — S8991XD Unspecified injury of right lower leg, subsequent encounter: Secondary | ICD-10-CM | POA: Diagnosis not present

## 2022-02-02 DIAGNOSIS — S81801A Unspecified open wound, right lower leg, initial encounter: Secondary | ICD-10-CM | POA: Diagnosis not present

## 2022-02-02 DIAGNOSIS — R609 Edema, unspecified: Secondary | ICD-10-CM | POA: Diagnosis not present

## 2022-02-02 DIAGNOSIS — R52 Pain, unspecified: Secondary | ICD-10-CM | POA: Diagnosis not present

## 2022-02-03 DIAGNOSIS — M961 Postlaminectomy syndrome, not elsewhere classified: Secondary | ICD-10-CM | POA: Diagnosis not present

## 2022-02-03 DIAGNOSIS — Z79891 Long term (current) use of opiate analgesic: Secondary | ICD-10-CM | POA: Diagnosis not present

## 2022-02-03 DIAGNOSIS — M1612 Unilateral primary osteoarthritis, left hip: Secondary | ICD-10-CM | POA: Diagnosis not present

## 2022-02-03 DIAGNOSIS — G894 Chronic pain syndrome: Secondary | ICD-10-CM | POA: Diagnosis not present

## 2022-02-03 DIAGNOSIS — K5903 Drug induced constipation: Secondary | ICD-10-CM | POA: Diagnosis not present

## 2022-02-04 DIAGNOSIS — N323 Diverticulum of bladder: Secondary | ICD-10-CM | POA: Diagnosis not present

## 2022-02-04 DIAGNOSIS — S81801D Unspecified open wound, right lower leg, subsequent encounter: Secondary | ICD-10-CM | POA: Diagnosis not present

## 2022-02-04 DIAGNOSIS — Z466 Encounter for fitting and adjustment of urinary device: Secondary | ICD-10-CM | POA: Diagnosis not present

## 2022-02-04 DIAGNOSIS — N320 Bladder-neck obstruction: Secondary | ICD-10-CM | POA: Diagnosis not present

## 2022-02-04 DIAGNOSIS — R339 Retention of urine, unspecified: Secondary | ICD-10-CM | POA: Diagnosis not present

## 2022-02-04 DIAGNOSIS — S8991XD Unspecified injury of right lower leg, subsequent encounter: Secondary | ICD-10-CM | POA: Diagnosis not present

## 2022-02-04 DIAGNOSIS — M810 Age-related osteoporosis without current pathological fracture: Secondary | ICD-10-CM | POA: Diagnosis not present

## 2022-02-11 DIAGNOSIS — J449 Chronic obstructive pulmonary disease, unspecified: Secondary | ICD-10-CM | POA: Diagnosis not present

## 2022-02-13 DIAGNOSIS — R339 Retention of urine, unspecified: Secondary | ICD-10-CM | POA: Diagnosis not present

## 2022-02-13 DIAGNOSIS — E7800 Pure hypercholesterolemia, unspecified: Secondary | ICD-10-CM | POA: Diagnosis not present

## 2022-02-13 DIAGNOSIS — Z7902 Long term (current) use of antithrombotics/antiplatelets: Secondary | ICD-10-CM | POA: Diagnosis not present

## 2022-02-13 DIAGNOSIS — Z7982 Long term (current) use of aspirin: Secondary | ICD-10-CM | POA: Diagnosis not present

## 2022-02-13 DIAGNOSIS — N320 Bladder-neck obstruction: Secondary | ICD-10-CM | POA: Diagnosis not present

## 2022-02-13 DIAGNOSIS — I2510 Atherosclerotic heart disease of native coronary artery without angina pectoris: Secondary | ICD-10-CM | POA: Diagnosis not present

## 2022-02-13 DIAGNOSIS — M810 Age-related osteoporosis without current pathological fracture: Secondary | ICD-10-CM | POA: Diagnosis not present

## 2022-02-13 DIAGNOSIS — S8991XD Unspecified injury of right lower leg, subsequent encounter: Secondary | ICD-10-CM | POA: Diagnosis not present

## 2022-02-13 DIAGNOSIS — Z466 Encounter for fitting and adjustment of urinary device: Secondary | ICD-10-CM | POA: Diagnosis not present

## 2022-02-13 DIAGNOSIS — S81801D Unspecified open wound, right lower leg, subsequent encounter: Secondary | ICD-10-CM | POA: Diagnosis not present

## 2022-02-13 DIAGNOSIS — S81801A Unspecified open wound, right lower leg, initial encounter: Secondary | ICD-10-CM | POA: Diagnosis not present

## 2022-02-13 DIAGNOSIS — Z79899 Other long term (current) drug therapy: Secondary | ICD-10-CM | POA: Diagnosis not present

## 2022-02-13 DIAGNOSIS — N323 Diverticulum of bladder: Secondary | ICD-10-CM | POA: Diagnosis not present

## 2022-02-13 DIAGNOSIS — I10 Essential (primary) hypertension: Secondary | ICD-10-CM | POA: Diagnosis not present

## 2022-02-13 DIAGNOSIS — J449 Chronic obstructive pulmonary disease, unspecified: Secondary | ICD-10-CM | POA: Diagnosis not present

## 2022-02-13 DIAGNOSIS — Z87891 Personal history of nicotine dependence: Secondary | ICD-10-CM | POA: Diagnosis not present

## 2022-02-15 DIAGNOSIS — R339 Retention of urine, unspecified: Secondary | ICD-10-CM | POA: Diagnosis not present

## 2022-02-15 DIAGNOSIS — M5416 Radiculopathy, lumbar region: Secondary | ICD-10-CM | POA: Diagnosis not present

## 2022-02-15 DIAGNOSIS — E785 Hyperlipidemia, unspecified: Secondary | ICD-10-CM | POA: Diagnosis not present

## 2022-02-15 DIAGNOSIS — J449 Chronic obstructive pulmonary disease, unspecified: Secondary | ICD-10-CM | POA: Diagnosis not present

## 2022-02-15 DIAGNOSIS — Z95810 Presence of automatic (implantable) cardiac defibrillator: Secondary | ICD-10-CM | POA: Diagnosis not present

## 2022-02-15 DIAGNOSIS — I10 Essential (primary) hypertension: Secondary | ICD-10-CM | POA: Diagnosis not present

## 2022-02-15 DIAGNOSIS — N323 Diverticulum of bladder: Secondary | ICD-10-CM | POA: Diagnosis not present

## 2022-02-15 DIAGNOSIS — K5900 Constipation, unspecified: Secondary | ICD-10-CM | POA: Diagnosis not present

## 2022-02-15 DIAGNOSIS — K219 Gastro-esophageal reflux disease without esophagitis: Secondary | ICD-10-CM | POA: Diagnosis not present

## 2022-02-15 DIAGNOSIS — Z792 Long term (current) use of antibiotics: Secondary | ICD-10-CM | POA: Diagnosis not present

## 2022-02-15 DIAGNOSIS — Z466 Encounter for fitting and adjustment of urinary device: Secondary | ICD-10-CM | POA: Diagnosis not present

## 2022-02-15 DIAGNOSIS — I252 Old myocardial infarction: Secondary | ICD-10-CM | POA: Diagnosis not present

## 2022-02-15 DIAGNOSIS — N320 Bladder-neck obstruction: Secondary | ICD-10-CM | POA: Diagnosis not present

## 2022-02-15 DIAGNOSIS — E079 Disorder of thyroid, unspecified: Secondary | ICD-10-CM | POA: Diagnosis not present

## 2022-02-15 DIAGNOSIS — M810 Age-related osteoporosis without current pathological fracture: Secondary | ICD-10-CM | POA: Diagnosis not present

## 2022-02-15 DIAGNOSIS — Z9181 History of falling: Secondary | ICD-10-CM | POA: Diagnosis not present

## 2022-02-24 NOTE — Progress Notes (Signed)
Remote pacemaker transmission.   

## 2022-02-26 DIAGNOSIS — M5416 Radiculopathy, lumbar region: Secondary | ICD-10-CM | POA: Diagnosis not present

## 2022-02-26 DIAGNOSIS — N320 Bladder-neck obstruction: Secondary | ICD-10-CM | POA: Diagnosis not present

## 2022-02-26 DIAGNOSIS — R339 Retention of urine, unspecified: Secondary | ICD-10-CM | POA: Diagnosis not present

## 2022-02-26 DIAGNOSIS — N323 Diverticulum of bladder: Secondary | ICD-10-CM | POA: Diagnosis not present

## 2022-02-26 DIAGNOSIS — Z466 Encounter for fitting and adjustment of urinary device: Secondary | ICD-10-CM | POA: Diagnosis not present

## 2022-02-26 DIAGNOSIS — M810 Age-related osteoporosis without current pathological fracture: Secondary | ICD-10-CM | POA: Diagnosis not present

## 2022-03-03 DIAGNOSIS — M961 Postlaminectomy syndrome, not elsewhere classified: Secondary | ICD-10-CM | POA: Diagnosis not present

## 2022-03-03 DIAGNOSIS — K5903 Drug induced constipation: Secondary | ICD-10-CM | POA: Diagnosis not present

## 2022-03-03 DIAGNOSIS — G894 Chronic pain syndrome: Secondary | ICD-10-CM | POA: Diagnosis not present

## 2022-03-03 DIAGNOSIS — M1612 Unilateral primary osteoarthritis, left hip: Secondary | ICD-10-CM | POA: Diagnosis not present

## 2022-03-03 DIAGNOSIS — Z79891 Long term (current) use of opiate analgesic: Secondary | ICD-10-CM | POA: Diagnosis not present

## 2022-03-05 DIAGNOSIS — R339 Retention of urine, unspecified: Secondary | ICD-10-CM | POA: Diagnosis not present

## 2022-03-05 DIAGNOSIS — N320 Bladder-neck obstruction: Secondary | ICD-10-CM | POA: Diagnosis not present

## 2022-03-05 DIAGNOSIS — M810 Age-related osteoporosis without current pathological fracture: Secondary | ICD-10-CM | POA: Diagnosis not present

## 2022-03-05 DIAGNOSIS — Z466 Encounter for fitting and adjustment of urinary device: Secondary | ICD-10-CM | POA: Diagnosis not present

## 2022-03-05 DIAGNOSIS — N323 Diverticulum of bladder: Secondary | ICD-10-CM | POA: Diagnosis not present

## 2022-03-05 DIAGNOSIS — M5416 Radiculopathy, lumbar region: Secondary | ICD-10-CM | POA: Diagnosis not present

## 2022-03-09 DIAGNOSIS — I10 Essential (primary) hypertension: Secondary | ICD-10-CM | POA: Diagnosis not present

## 2022-03-09 DIAGNOSIS — R300 Dysuria: Secondary | ICD-10-CM | POA: Diagnosis not present

## 2022-03-09 DIAGNOSIS — I441 Atrioventricular block, second degree: Secondary | ICD-10-CM | POA: Diagnosis not present

## 2022-03-09 DIAGNOSIS — I2510 Atherosclerotic heart disease of native coronary artery without angina pectoris: Secondary | ICD-10-CM | POA: Diagnosis not present

## 2022-03-09 DIAGNOSIS — Z681 Body mass index (BMI) 19 or less, adult: Secondary | ICD-10-CM | POA: Diagnosis not present

## 2022-03-09 DIAGNOSIS — R197 Diarrhea, unspecified: Secondary | ICD-10-CM | POA: Diagnosis not present

## 2022-03-09 DIAGNOSIS — R002 Palpitations: Secondary | ICD-10-CM | POA: Diagnosis not present

## 2022-03-10 DIAGNOSIS — Z466 Encounter for fitting and adjustment of urinary device: Secondary | ICD-10-CM | POA: Diagnosis not present

## 2022-03-10 DIAGNOSIS — N323 Diverticulum of bladder: Secondary | ICD-10-CM | POA: Diagnosis not present

## 2022-03-10 DIAGNOSIS — M810 Age-related osteoporosis without current pathological fracture: Secondary | ICD-10-CM | POA: Diagnosis not present

## 2022-03-10 DIAGNOSIS — M5416 Radiculopathy, lumbar region: Secondary | ICD-10-CM | POA: Diagnosis not present

## 2022-03-10 DIAGNOSIS — R339 Retention of urine, unspecified: Secondary | ICD-10-CM | POA: Diagnosis not present

## 2022-03-10 DIAGNOSIS — N320 Bladder-neck obstruction: Secondary | ICD-10-CM | POA: Diagnosis not present

## 2022-03-11 DIAGNOSIS — R300 Dysuria: Secondary | ICD-10-CM | POA: Diagnosis not present

## 2022-03-17 DIAGNOSIS — Z466 Encounter for fitting and adjustment of urinary device: Secondary | ICD-10-CM | POA: Diagnosis not present

## 2022-03-17 DIAGNOSIS — J449 Chronic obstructive pulmonary disease, unspecified: Secondary | ICD-10-CM | POA: Diagnosis not present

## 2022-03-17 DIAGNOSIS — N323 Diverticulum of bladder: Secondary | ICD-10-CM | POA: Diagnosis not present

## 2022-03-17 DIAGNOSIS — E785 Hyperlipidemia, unspecified: Secondary | ICD-10-CM | POA: Diagnosis not present

## 2022-03-17 DIAGNOSIS — R339 Retention of urine, unspecified: Secondary | ICD-10-CM | POA: Diagnosis not present

## 2022-03-17 DIAGNOSIS — Z792 Long term (current) use of antibiotics: Secondary | ICD-10-CM | POA: Diagnosis not present

## 2022-03-17 DIAGNOSIS — Z9181 History of falling: Secondary | ICD-10-CM | POA: Diagnosis not present

## 2022-03-17 DIAGNOSIS — K5900 Constipation, unspecified: Secondary | ICD-10-CM | POA: Diagnosis not present

## 2022-03-17 DIAGNOSIS — K219 Gastro-esophageal reflux disease without esophagitis: Secondary | ICD-10-CM | POA: Diagnosis not present

## 2022-03-17 DIAGNOSIS — M5416 Radiculopathy, lumbar region: Secondary | ICD-10-CM | POA: Diagnosis not present

## 2022-03-17 DIAGNOSIS — N320 Bladder-neck obstruction: Secondary | ICD-10-CM | POA: Diagnosis not present

## 2022-03-17 DIAGNOSIS — I10 Essential (primary) hypertension: Secondary | ICD-10-CM | POA: Diagnosis not present

## 2022-03-17 DIAGNOSIS — I252 Old myocardial infarction: Secondary | ICD-10-CM | POA: Diagnosis not present

## 2022-03-17 DIAGNOSIS — Z95810 Presence of automatic (implantable) cardiac defibrillator: Secondary | ICD-10-CM | POA: Diagnosis not present

## 2022-03-17 DIAGNOSIS — M810 Age-related osteoporosis without current pathological fracture: Secondary | ICD-10-CM | POA: Diagnosis not present

## 2022-03-17 DIAGNOSIS — E079 Disorder of thyroid, unspecified: Secondary | ICD-10-CM | POA: Diagnosis not present

## 2022-03-18 DIAGNOSIS — M810 Age-related osteoporosis without current pathological fracture: Secondary | ICD-10-CM | POA: Diagnosis not present

## 2022-03-18 DIAGNOSIS — N323 Diverticulum of bladder: Secondary | ICD-10-CM | POA: Diagnosis not present

## 2022-03-18 DIAGNOSIS — M5416 Radiculopathy, lumbar region: Secondary | ICD-10-CM | POA: Diagnosis not present

## 2022-03-18 DIAGNOSIS — Z466 Encounter for fitting and adjustment of urinary device: Secondary | ICD-10-CM | POA: Diagnosis not present

## 2022-03-18 DIAGNOSIS — R339 Retention of urine, unspecified: Secondary | ICD-10-CM | POA: Diagnosis not present

## 2022-03-18 DIAGNOSIS — N320 Bladder-neck obstruction: Secondary | ICD-10-CM | POA: Diagnosis not present

## 2022-03-30 DIAGNOSIS — Z79891 Long term (current) use of opiate analgesic: Secondary | ICD-10-CM | POA: Diagnosis not present

## 2022-03-30 DIAGNOSIS — M961 Postlaminectomy syndrome, not elsewhere classified: Secondary | ICD-10-CM | POA: Diagnosis not present

## 2022-03-30 DIAGNOSIS — G894 Chronic pain syndrome: Secondary | ICD-10-CM | POA: Diagnosis not present

## 2022-03-30 DIAGNOSIS — M1612 Unilateral primary osteoarthritis, left hip: Secondary | ICD-10-CM | POA: Diagnosis not present

## 2022-03-30 DIAGNOSIS — K5903 Drug induced constipation: Secondary | ICD-10-CM | POA: Diagnosis not present

## 2022-04-03 DIAGNOSIS — I441 Atrioventricular block, second degree: Secondary | ICD-10-CM | POA: Diagnosis not present

## 2022-04-03 DIAGNOSIS — I10 Essential (primary) hypertension: Secondary | ICD-10-CM | POA: Diagnosis not present

## 2022-04-03 DIAGNOSIS — M542 Cervicalgia: Secondary | ICD-10-CM | POA: Diagnosis not present

## 2022-04-03 DIAGNOSIS — Z8673 Personal history of transient ischemic attack (TIA), and cerebral infarction without residual deficits: Secondary | ICD-10-CM | POA: Diagnosis not present

## 2022-04-03 DIAGNOSIS — R300 Dysuria: Secondary | ICD-10-CM | POA: Diagnosis not present

## 2022-04-03 DIAGNOSIS — K5900 Constipation, unspecified: Secondary | ICD-10-CM | POA: Diagnosis not present

## 2022-04-03 DIAGNOSIS — M545 Low back pain, unspecified: Secondary | ICD-10-CM | POA: Diagnosis not present

## 2022-04-03 DIAGNOSIS — R202 Paresthesia of skin: Secondary | ICD-10-CM | POA: Diagnosis not present

## 2022-04-03 DIAGNOSIS — E785 Hyperlipidemia, unspecified: Secondary | ICD-10-CM | POA: Diagnosis not present

## 2022-04-03 DIAGNOSIS — K219 Gastro-esophageal reflux disease without esophagitis: Secondary | ICD-10-CM | POA: Diagnosis not present

## 2022-04-03 DIAGNOSIS — Z681 Body mass index (BMI) 19 or less, adult: Secondary | ICD-10-CM | POA: Diagnosis not present

## 2022-04-03 DIAGNOSIS — I2510 Atherosclerotic heart disease of native coronary artery without angina pectoris: Secondary | ICD-10-CM | POA: Diagnosis not present

## 2022-04-08 DIAGNOSIS — Z23 Encounter for immunization: Secondary | ICD-10-CM | POA: Diagnosis not present

## 2022-04-11 DIAGNOSIS — M5416 Radiculopathy, lumbar region: Secondary | ICD-10-CM | POA: Diagnosis not present

## 2022-04-11 DIAGNOSIS — Z466 Encounter for fitting and adjustment of urinary device: Secondary | ICD-10-CM | POA: Diagnosis not present

## 2022-04-11 DIAGNOSIS — M810 Age-related osteoporosis without current pathological fracture: Secondary | ICD-10-CM | POA: Diagnosis not present

## 2022-04-11 DIAGNOSIS — N323 Diverticulum of bladder: Secondary | ICD-10-CM | POA: Diagnosis not present

## 2022-04-11 DIAGNOSIS — R339 Retention of urine, unspecified: Secondary | ICD-10-CM | POA: Diagnosis not present

## 2022-04-11 DIAGNOSIS — N320 Bladder-neck obstruction: Secondary | ICD-10-CM | POA: Diagnosis not present

## 2022-04-15 DIAGNOSIS — M810 Age-related osteoporosis without current pathological fracture: Secondary | ICD-10-CM | POA: Diagnosis not present

## 2022-04-15 DIAGNOSIS — R339 Retention of urine, unspecified: Secondary | ICD-10-CM | POA: Diagnosis not present

## 2022-04-15 DIAGNOSIS — Z466 Encounter for fitting and adjustment of urinary device: Secondary | ICD-10-CM | POA: Diagnosis not present

## 2022-04-15 DIAGNOSIS — N323 Diverticulum of bladder: Secondary | ICD-10-CM | POA: Diagnosis not present

## 2022-04-15 DIAGNOSIS — M5416 Radiculopathy, lumbar region: Secondary | ICD-10-CM | POA: Diagnosis not present

## 2022-04-15 DIAGNOSIS — N320 Bladder-neck obstruction: Secondary | ICD-10-CM | POA: Diagnosis not present

## 2022-04-22 ENCOUNTER — Ambulatory Visit (INDEPENDENT_AMBULATORY_CARE_PROVIDER_SITE_OTHER): Payer: Medicare Other

## 2022-04-22 DIAGNOSIS — I441 Atrioventricular block, second degree: Secondary | ICD-10-CM | POA: Diagnosis not present

## 2022-04-22 DIAGNOSIS — R3914 Feeling of incomplete bladder emptying: Secondary | ICD-10-CM | POA: Diagnosis not present

## 2022-04-22 DIAGNOSIS — N3020 Other chronic cystitis without hematuria: Secondary | ICD-10-CM | POA: Diagnosis not present

## 2022-04-23 LAB — CUP PACEART REMOTE DEVICE CHECK
Battery Remaining Longevity: 91 mo
Battery Remaining Percentage: 89 %
Battery Voltage: 2.99 V
Brady Statistic AP VP Percent: 19 %
Brady Statistic AP VS Percent: 1 %
Brady Statistic AS VP Percent: 51 %
Brady Statistic AS VS Percent: 29 %
Brady Statistic RA Percent Paced: 20 %
Brady Statistic RV Percent Paced: 70 %
Date Time Interrogation Session: 20231108032653
Implantable Lead Connection Status: 753985
Implantable Lead Connection Status: 753985
Implantable Lead Implant Date: 20221109
Implantable Lead Implant Date: 20221109
Implantable Lead Location: 753859
Implantable Lead Location: 753860
Implantable Pulse Generator Implant Date: 20221109
Lead Channel Impedance Value: 380 Ohm
Lead Channel Impedance Value: 410 Ohm
Lead Channel Pacing Threshold Amplitude: 0.75 V
Lead Channel Pacing Threshold Amplitude: 1 V
Lead Channel Pacing Threshold Pulse Width: 0.5 ms
Lead Channel Pacing Threshold Pulse Width: 0.5 ms
Lead Channel Sensing Intrinsic Amplitude: 4.7 mV
Lead Channel Sensing Intrinsic Amplitude: 8.6 mV
Lead Channel Setting Pacing Amplitude: 2 V
Lead Channel Setting Pacing Amplitude: 2.5 V
Lead Channel Setting Pacing Pulse Width: 0.5 ms
Lead Channel Setting Sensing Sensitivity: 2 mV
Pulse Gen Model: 2272
Pulse Gen Serial Number: 3956509

## 2022-04-29 DIAGNOSIS — K5903 Drug induced constipation: Secondary | ICD-10-CM | POA: Diagnosis not present

## 2022-04-29 DIAGNOSIS — M1612 Unilateral primary osteoarthritis, left hip: Secondary | ICD-10-CM | POA: Diagnosis not present

## 2022-04-29 DIAGNOSIS — M961 Postlaminectomy syndrome, not elsewhere classified: Secondary | ICD-10-CM | POA: Diagnosis not present

## 2022-04-29 DIAGNOSIS — G894 Chronic pain syndrome: Secondary | ICD-10-CM | POA: Diagnosis not present

## 2022-04-29 DIAGNOSIS — Z79891 Long term (current) use of opiate analgesic: Secondary | ICD-10-CM | POA: Diagnosis not present

## 2022-05-06 NOTE — Progress Notes (Signed)
Remote pacemaker transmission.   

## 2022-05-11 ENCOUNTER — Encounter: Payer: Medicare Other | Admitting: Cardiology

## 2022-05-11 DIAGNOSIS — R200 Anesthesia of skin: Secondary | ICD-10-CM | POA: Diagnosis not present

## 2022-05-11 DIAGNOSIS — M542 Cervicalgia: Secondary | ICD-10-CM | POA: Diagnosis not present

## 2022-05-11 DIAGNOSIS — R202 Paresthesia of skin: Secondary | ICD-10-CM | POA: Diagnosis not present

## 2022-05-11 DIAGNOSIS — M4802 Spinal stenosis, cervical region: Secondary | ICD-10-CM | POA: Diagnosis not present

## 2022-05-14 ENCOUNTER — Encounter: Payer: Medicare Other | Admitting: Cardiology

## 2022-05-14 NOTE — Progress Notes (Deleted)
Electrophysiology Office Note   Date:  05/14/2022   ID:  Mckenzie OsgoodDorothy W [name], DOB [birth year], MRN [id number]  PCP:  Mckenzie [name], Mckenzie E, PA-C  Cardiologist:  Mckenzie [name] Primary Electrophysiologist:  Mckenzie Kocak Jorja LoaMartin Areta Terwilliger, MD    Chief Complaint: heart block   History of Present Illness: Mckenzie [name] is a 86 y.o. female who is being seen today for the evaluation of heart block at the request of Mckenzie [name], *. Presenting today for electrophysiology evaluation.  She has a history significant for coronary artery disease post LAD stent 2003, hypertension, hyperlipidemia, diastolic heart failure, COPD, SVT.  She presented to the hospital November 2022 with Mobitz 2 AV block.  She is post Abbott dual-chamber pacemaker implanted 04/23/2021.  Today, denies symptoms of palpitations, chest pain, shortness of breath, orthopnea, PND, lower extremity edema, claudication, dizziness, presyncope, syncope, bleeding, or neurologic sequela. The patient is tolerating medications without difficulties. ***    Past Medical History:  Diagnosis Date   Aortic valve sclerosis    Mild, echo, April, 2012   ARDS (adult respiratory distress syndrome) (HCC) 2003   with tracheostomy-peritonitis   Arthritis    Back pain    Need for surgery, Dr. Trey SailorsMark Roy   CAD (coronary artery disease)    Stent to LAD, 2003 [facility] / nuclear scan April, 201 to artifact / persistent shortness of breath / catheterization Oct 21, 2010... widely patent stent to the mid LAD, no significant obstructive disease, should be low risk    for back surgery   Carotid arterial disease (HCC)    Moderate-Dr. Myra GianottiBrabham following   Chronic back pain    COPD (chronic obstructive pulmonary disease) (HCC)    Diastolic dysfunction    Echo, April, 2011   EF 65%   Dysrhythmia    afib   Ejection fraction    EF 65%, echo, September 15, 2010   GERD (gastroesophageal reflux disease)    Hemolytic anemia (HCC)    Followed by Dr. Derald Macleodarvosky    Hypertension    Hypothyroidism    Left ventricular hypertrophy    EF 65%, echo, April, 2011   MR (mitral regurgitation)     Mild echo, 2012 ///  Mild per Echo 2009,  mild, echo, April, 2011   Myocardial infarction [facility](HCC)    Palpitations    Holter, September 16, 2010, PACs and PVCs,one 4 beat run of SVT   Pneumonia 11-2011   Preoperative clearance    Preop clearance for back surgery   Shortness of breath    March, 2012   UTI (urinary tract infection)    Chronic-on Macrodantin   Past Surgical History:  Procedure Laterality Date   APPENDECTOMY     BACK SURGERY     BREAST LUMPECTOMY Left    CATARACT EXTRACTION W/PHACO Right 12/05/2012   Procedure: CATARACT EXTRACTION PHACO AND INTRAOCULAR LENS PLACEMENT (IOC);  Surgeon: Gemma PayorKerry Hunt, MD;  Location: [facility];  Service: Ophthalmology;  Laterality: Right;  CDE:15.30   CATARACT EXTRACTION W/PHACO Left 12/15/2012   Procedure: CATARACT EXTRACTION PHACO AND INTRAOCULAR LENS PLACEMENT (IOC);  Surgeon: Gemma PayorKerry Hunt, MD;  Location: [facility];  Service: Ophthalmology;  Laterality: Left;  CDE:22.56   CORONARY ANGIOPLASTY WITH STENT PLACEMENT  2003   INTRAMEDULLARY (IM) NAIL INTERTROCHANTERIC Left 09/27/2020   Procedure: INTRAMEDULLARY (IM) NAIL INTERTROCHANTRIC;  Surgeon: Cammy Copaean, Gregory Scott, MD;  Location: [facility];  Service: Orthopedics;  Laterality: Left;  left trochanter   PACEMAKER IMPLANT N/A 04/23/2021   Procedure: PACEMAKER IMPLANT;  Surgeon: Elberta Fortisamnitz,  Andree Coss, MD;  [facility]: [facility];  Service: Cardiovascular;  Laterality: N/A;   repair of ruptured lumbar discs  12-2011   SPINE SURGERY     lumbar disk removal by Dr. Laurian Brim at [facility]   THYROID SURGERY  08/2009   Benign adenomas   TONSILLECTOMY     TRACHEOSTOMY  2003   Following ARDS      Current Outpatient Medications  Medication Sig Dispense Refill   aspirin EC 81 MG EC tablet Take 1 tablet (81 mg total) by mouth daily. Swallow whole. 30 tablet 11   atorvastatin (LIPITOR) 40 MG  tablet Take 1 tablet (40 mg total) by mouth daily. 30 tablet 1   benazepril (LOTENSIN) 20 MG tablet Take 1 tablet (20 mg total) by mouth daily. 30 tablet 0   Calcium Carbonate (CALCIUM 600 PO) Take 1,200 mg by mouth daily.     Cholecalciferol (VITAMIN D3) 1000 UNITS CAPS Take 1,000 Units by mouth daily. Take one tab daily     clopidogrel (PLAVIX) 75 MG tablet Take 1 tablet (75 mg total) by mouth daily. 20 tablet 0   conjugated estrogens (PREMARIN) vaginal cream Place 1 Applicatorful vaginally daily.     diphenhydrAMINE (BENADRYL) 25 mg capsule Take 25 mg by mouth daily as needed for sleep.     FERREX 150 150 MG capsule Take 150 mg by mouth daily.     HYDROcodone-acetaminophen (NORCO) 10-325 MG tablet Take 1 tablet by mouth every 4 (four) hours as needed for severe pain. 30 tablet 0   leptospermum manuka honey (MEDIHONEY) PSTE paste Apply 1 application. topically daily.     levothyroxine (SYNTHROID) 25 MCG tablet TAKE 1 TABLET BY MOUTH DAILY AT 6 AM. (Patient taking differently: Take 25 mcg by mouth daily before breakfast.) 30 tablet 0   Melatonin 10 MG TABS Take 1 tablet by mouth at bedtime.     metoprolol succinate (TOPROL-XL) 25 MG 24 hr tablet Take 25 mg by mouth daily.     MOVANTIK 25 MG TABS tablet Take 25 mg by mouth daily.     Multiple Vitamin (MULTIVITAMIN) tablet Take 1 tablet by mouth daily.     nitroGLYCERIN (NITROSTAT) 0.4 MG SL tablet Place under the tongue.     polyethylene glycol (MIRALAX / GLYCOLAX) 17 g packet Take 17 g by mouth every morning.     raloxifene (EVISTA) 60 MG tablet Take 60 mg by mouth daily.     tamsulosin (FLOMAX) 0.4 MG CAPS capsule Take 0.4 mg by mouth daily.     temazepam (RESTORIL) 30 MG capsule Take 30 mg by mouth at bedtime.     tretinoin (RETIN-A) 0.05 % cream Apply 1 application topically at bedtime.     No current facility-administered medications for this visit.    Allergies:   Tetracycline   Social History:  The patient  reports that she quit  smoking about 21 years ago. Her smoking use included cigarettes. She has a 20.00 pack-year smoking history. She has never used smokeless tobacco. She reports that she does not drink alcohol and does not use drugs.   Family History:  The patient's family history includes Diabetes in her son; Heart failure in her father.   ROS:  Please see the history of present illness.   Otherwise, review of systems is positive for none.   All other systems are reviewed and negative.   PHYSICAL EXAM: VS:  There were no vitals taken for this visit. , BMI There is no  height or weight on file to calculate BMI. GEN: Well nourished, well developed, in no acute distress  HEENT: normal  Neck: no JVD, carotid bruits, or masses Cardiac: ***RRR; no murmurs, rubs, or gallops,no edema  Respiratory:  clear to auscultation bilaterally, normal work of breathing GI: soft, nontender, nondistended, + BS MS: no deformity or atrophy  Skin: warm and dry, device site well healed Neuro:  Strength and sensation are intact Psych: euthymic mood, full affect  EKG:  EKG {ACTION; IS/IS TTS:[id number]} ordered today. Personal review of the ekg ordered *** shows ***  Personal review of the device interrogation today. Results in [facility]    Recent Labs: 10/06/2021: ALT 19; BUN 22; Creatinine, Ser 0.50; Hemoglobin 11.9; Magnesium 1.7; Platelets 174; Potassium 3.8; Sodium 138    Lipid Panel     Component Value Date/Time   CHOL 126 10/06/2021 0414   TRIG 70 10/06/2021 0414   HDL 60 10/06/2021 0414   CHOLHDL 2.1 10/06/2021 0414   VLDL 14 10/06/2021 0414   LDLCALC 52 10/06/2021 0414     Wt Readings from Last 3 Encounters:  10/06/21 90 lb 12.8 oz (41.2 kg)  08/12/21 95 lb (43.1 kg)  05/04/21 104 lb 11.5 oz (47.5 kg)      Other studies Reviewed: Additional studies/ records that were reviewed today include: TTE 04/21/21  Review of the above records today demonstrates:   1. Left ventricular ejection fraction, by estimation, is  60 to 65%. The  left ventricle has normal function. The left ventricle has no regional  wall motion abnormalities. Left ventricular diastolic parameters are  indeterminate.   2. Right ventricular systolic function is normal. The right ventricular  size is normal. Mildly increased right ventricular wall thickness. There  is mildly elevated pulmonary artery systolic pressure. The estimated right  ventricular systolic pressure is  40.7 mmHg.   3. The mitral valve is degenerative. Moderate mitral valve regurgitation.  The mean mitral valve gradient is 6.0 mmHg with average heart rate of 38  bpm. No pulmonary vein assessment. Diastolic mitral regurgitation seen in  the setting of heart block.   4. Left atrial size was severely dilated.   5. The pericardial effusion is posterior and lateral to the left  ventricle.   6. The aortic valve is tricuspid. There is mild calcification of the  aortic valve. There is mild thickening of the aortic valve. Aortic valve  regurgitation is not visualized. Mild aortic valve stenosis. Aortic valve  mean gradient measures 18.0 mmHg.  Aortic valve Vmax measures 2.91 m/s. DVI 0.87 with normal LV stroke volume  index.   7. Diastolic tricuspid regurgitation noted in the setting of heart block.   8. The inferior vena cava is normal in size with greater than 50%  respiratory variability, suggesting right atrial pressure of 3 mmHg.    ASSESSMENT AND PLAN:  1.  2-1 second-degree AV block: Status post Abbott dual-chamber pacemaker implanted 04/23/2021.  Device function appropriately.  No changes at this time.  2.  Coronary artery disease: Status post LAD stent 2003.  No current chest pain.  Continue with current management.  3.  Hypertension:***  1.  2-1 second-degree AV block: Status post Abbott dual-chamber pacemaker implanted 04/23/2021.  Device functioning appropriately.  No changes at this time.  2.  Coronary artery disease: LAD stent in 2003.  No current  chest pain.  Continue with current management.  3.  Hypertension: Currently well controlled   Current medicines are reviewed at length with the  patient today.   The patient does not have concerns regarding her medicines.  The following changes were made today:  ***  Labs/ tests ordered today include:  No orders of the defined types were placed in this encounter.    Disposition:   FU *** months  Signed, Devita Nies Jorja Loa, MD  05/14/2022 1:52 PM     [facility] [address] [place] [address] [phone] (office) [phone] (fax)

## 2022-05-15 ENCOUNTER — Ambulatory Visit (INDEPENDENT_AMBULATORY_CARE_PROVIDER_SITE_OTHER): Payer: Medicare Other | Admitting: Physician Assistant

## 2022-05-15 VITALS — BP 167/71 | HR 69

## 2022-05-15 DIAGNOSIS — R339 Retention of urine, unspecified: Secondary | ICD-10-CM | POA: Diagnosis not present

## 2022-05-15 NOTE — Progress Notes (Signed)
05/15/2022 11:43 AM   Delle Reining 07-04-1935 [id number]   Assessment:  1. Urinary retention - Urinalysis, Routine w reflex microscopic   Plan: Monthly cath changed today and she will follow-up in 1 month with Dr. Annabell Howells as the patient would like to transfer her care to this office.   Chief Complaint: Cath change  Referring provider: Royann Shivers, PA-C [address] [place],  [place] [address]   History of Present Illness:  TOVE WIDEMAN is a 86 y.o. year old female with multiple medical problems including history of urinary retention who is seen for transfer of care to our office. She wants to continue seeing Dr. Annabell Howells who has been treating her chronic UTIs and urinary retention in [place].  She presents today for monthly catheter change.  She has no complaints and has not had gross hematuria or issues with her catheter. Medical records from [facility] unavailable for this visit.  Past Medical History:  Past Medical History:  Diagnosis Date   Aortic valve sclerosis    Mild, echo, April, 2012   ARDS (adult respiratory distress syndrome) (HCC) 2003   with tracheostomy-peritonitis   Arthritis    Back pain    Need for surgery, Dr. Trey Sailors   CAD (coronary artery disease)    Stent to LAD, 2003 [facility] / nuclear scan April, 201 to artifact / persistent shortness of breath / catheterization Oct 21, 2010... widely patent stent to the mid LAD, no significant obstructive disease, should be low risk    for back surgery   Carotid arterial disease (HCC)    Moderate-Dr. Myra Gianotti following   Chronic back pain    COPD (chronic obstructive pulmonary disease) (HCC)    Diastolic dysfunction    Echo, April, 2011   EF 65%   Dysrhythmia    afib   Ejection fraction    EF 65%, echo, September 15, 2010   GERD (gastroesophageal reflux disease)    Hemolytic anemia (HCC)    Followed by Dr. Derald Macleod   Hypertension    Hypothyroidism    Left ventricular hypertrophy    EF 65%, echo, April,  2011   MR (mitral regurgitation)     Mild echo, 2012 ///  Mild per Echo 2009,  mild, echo, April, 2011   Myocardial infarction [facility])    Palpitations    Holter, September 16, 2010, PACs and PVCs,one 4 beat run of SVT   Pneumonia 11-2011   Preoperative clearance    Preop clearance for back surgery   Shortness of breath    March, 2012   UTI (urinary tract infection)    Chronic-on Macrodantin    Past Surgical History:  Past Surgical History:  Procedure Laterality Date   APPENDECTOMY     BACK SURGERY     BREAST LUMPECTOMY Left    CATARACT EXTRACTION W/PHACO Right 12/05/2012   Procedure: CATARACT EXTRACTION PHACO AND INTRAOCULAR LENS PLACEMENT (IOC);  Surgeon: Gemma Payor, MD;  Location: [facility];  Service: Ophthalmology;  Laterality: Right;  CDE:15.30   CATARACT EXTRACTION W/PHACO Left 12/15/2012   Procedure: CATARACT EXTRACTION PHACO AND INTRAOCULAR LENS PLACEMENT (IOC);  Surgeon: Gemma Payor, MD;  Location: [facility];  Service: Ophthalmology;  Laterality: Left;  CDE:22.56   CORONARY ANGIOPLASTY WITH STENT PLACEMENT  2003   INTRAMEDULLARY (IM) NAIL INTERTROCHANTERIC Left 09/27/2020   Procedure: INTRAMEDULLARY (IM) NAIL INTERTROCHANTRIC;  Surgeon: Cammy Copa, MD;  Location: [facility];  Service: Orthopedics;  Laterality: Left;  left trochanter   PACEMAKER IMPLANT  N/A 04/23/2021   Procedure: PACEMAKER IMPLANT;  Surgeon: Regan Lemming, MD;  Location: [facility] INV[facility] CV [facility];  Service: Cardiovascular;  Laterality: N/A;   repair of ruptured lumbar discs  12-2011   SPINE SURGERY     lumbar disk removal by Dr. Laurian Brim at [facility]   THYROID SURGERY  08/2009   Benign adenomas   TONSILLECTOMY     TRACHEOSTOMY  2003   Following ARDS     Allergies:  Allergies  Allergen Reactions   Tetracycline Nausea Only    Family History:  Family History  Problem Relation Age of Onset   Heart failure Father    Diabetes Son     Social History:  Social History   Tobacco Use   Smoking status:  Former    Packs/day: 1.00    Years: 20.00    Total pack years: 20.00    Types: Cigarettes    Quit date: 06/15/2000    Years since quitting: 21.9   Smokeless tobacco: Never  Vaping Use   Vaping Use: Never used  Substance Use Topics   Alcohol use: No   Drug use: No    Review of symptoms:  Constitutional:  Negative for unexplained weight loss, night sweats, fever, chills ENT:  Negative for nose bleeds, sinus pain, painful swallowing CV:  Negative for loss of consciousness Resp:  Negative for cough GI:  Negative for nausea, vomiting, diarrhea, bloody stools GU:  Positives noted in HPI; otherwise negative for gross hematuria Psych:  Negative for lack of energy, depression, anxiety Endocrine:  Negative for polydipsia, polyuria, symptoms of hypoglycemia (dizziness, hunger, sweating) Hematologic:  Negative for anemia, purpura, petechia, prolonged or excessive bleeding, use of anticoagulants   Physical Exam: BP (!) 167/71   Pulse 69   Constitutional:  Alert and oriented, No acute distress.  She is thin, well-developed, and appears comfortable sitting in exam chair. HEENT: NCAT, moist mucus membranes.  Trachea midline, no masses. Cardiovascular: Regular rate and rhythm without murmur, rub, or gallops No clubbing, cyanosis, or edema. Respiratory: Normal respiratory effort, clear to auscultation bilaterally GI: Abdomen is soft, nontender, nondistended, no abdominal masses BACK:  Non-tender to palpation.  No CVAT Skin: No obvious rashes, warm, dry, intact Neurologic: Alert and oriented, Cranial nerves grossly intact, no focal deficits, moving all 4 extremities. Psychiatric: Appropriate. Normal mood and affect.  Laboratory Data: No results found for this or any previous visit (from the past 24 hour(s)).  Lab Results  Component Value Date   WBC 8.3 10/06/2021   HGB 11.9 (L) 10/06/2021   HCT 35.0 (L) 10/06/2021   MCV 91.8 10/06/2021   PLT 174 10/06/2021    Lab Results  Component  Value Date   CREATININE 0.50 10/06/2021    No results found for: "PSA"  No results found for: "TESTOSTERONE"  Lab Results  Component Value Date   HGBA1C 4.9 10/06/2021    Urinalysis    Component Value Date/Time   COLORURINE YELLOW 10/06/2021 0046   APPEARANCEUR HAZY (A) 10/06/2021 0046   LABSPEC 1.028 10/06/2021 0046   PHURINE 8.0 10/06/2021 0046   GLUCOSEU NEGATIVE 10/06/2021 0046   HGBUR SMALL (A) 10/06/2021 0046   BILIRUBINUR NEGATIVE 10/06/2021 0046   KETONESUR NEGATIVE 10/06/2021 0046   PROTEINUR 30 (A) 10/06/2021 0046   NITRITE NEGATIVE 10/06/2021 0046   LEUKOCYTESUR LARGE (A) 10/06/2021 0046    Lab Results  Component Value Date   BACTERIA RARE (A) 10/06/2021    Pertinent Imaging: No results found for this or  any previous visit.  Results for orders placed during the hospital encounter of 04/19/21  US Venous Img Lower Bilateral (DVT)  Narrative CLINICAL DATA:  Right calf swelling. Left leg pain. Left hip fracture in April 2022.  EXAM: BILATERAL LOWER EXTREMITY VENOUS DOPPLER ULTRASOUND  TECHNIQUE: Gray-scale sonography with compression, as well as color and duplex ultrasound, were performed to evaluate the deep venous system(s) from the level of the common femoral vein through the popliteal and proximal calf veins.  COMPARISON:  None.  FINDINGS: VENOUS  Normal compressibility of the common femoral, superficial femoral, and popliteal veins, as well as the visualized calf veins. Visualized portions of profunda femoral vein and great saphenous vein unremarkable. No filling defects to suggest DVT on grayscale or color Doppler imaging. Doppler waveforms show normal direction of venous flow, normal respiratory plasticity and response to augmentation.  Limited views of the contralateral common femoral vein are unremarkable.  OTHER  Right calf subcutaneous edema.  Limitations: none  IMPRESSION: Negative.   Electronically Signed By: Beckie Salts M.D. On: 04/21/2021 10:45     [place], Regan Rakers, PA-C [facility]

## 2022-05-15 NOTE — Progress Notes (Signed)
Cath Change/ Replacement  Patient is present today for a catheter change due to urinary retention.  30ml of water was removed from the balloon, a 16FR foley cath was removed without difficulty.  Patient was cleaned and prepped in a sterile fashion with betadine and 2% lidocaine jelly was instilled into the urethra. A 16 FR foley cath was replaced into the bladder, no complications were noted. Urine return was noted 77ml and urine was yellow in color. The balloon was filled with 65ml of sterile water. A leg bag was attached for drainage.  A night bag was also given to the patient and patient was given instruction on how to change from one bag to another. Patient was given proper instruction on catheter care.    Performed by: Kennyth Lose, CMA  Follow up: Follow up as scheduled

## 2022-06-03 DIAGNOSIS — G894 Chronic pain syndrome: Secondary | ICD-10-CM | POA: Diagnosis not present

## 2022-06-03 DIAGNOSIS — M961 Postlaminectomy syndrome, not elsewhere classified: Secondary | ICD-10-CM | POA: Diagnosis not present

## 2022-06-03 DIAGNOSIS — K5903 Drug induced constipation: Secondary | ICD-10-CM | POA: Diagnosis not present

## 2022-06-03 DIAGNOSIS — Z79891 Long term (current) use of opiate analgesic: Secondary | ICD-10-CM | POA: Diagnosis not present

## 2022-06-03 DIAGNOSIS — M1612 Unilateral primary osteoarthritis, left hip: Secondary | ICD-10-CM | POA: Diagnosis not present

## 2022-06-12 DIAGNOSIS — Z681 Body mass index (BMI) 19 or less, adult: Secondary | ICD-10-CM | POA: Diagnosis not present

## 2022-06-12 DIAGNOSIS — R030 Elevated blood-pressure reading, without diagnosis of hypertension: Secondary | ICD-10-CM | POA: Diagnosis not present

## 2022-06-12 DIAGNOSIS — R300 Dysuria: Secondary | ICD-10-CM | POA: Diagnosis not present

## 2022-06-12 DIAGNOSIS — L03116 Cellulitis of left lower limb: Secondary | ICD-10-CM | POA: Diagnosis not present

## 2022-06-18 ENCOUNTER — Ambulatory Visit: Payer: Medicare Other

## 2022-06-18 ENCOUNTER — Ambulatory Visit (INDEPENDENT_AMBULATORY_CARE_PROVIDER_SITE_OTHER): Payer: Medicare HMO | Admitting: Urology

## 2022-06-18 DIAGNOSIS — R399 Unspecified symptoms and signs involving the genitourinary system: Secondary | ICD-10-CM | POA: Diagnosis not present

## 2022-06-18 DIAGNOSIS — R339 Retention of urine, unspecified: Secondary | ICD-10-CM | POA: Diagnosis not present

## 2022-06-18 NOTE — Progress Notes (Addendum)
Cath Change/ Replacement  Patient is present today for a catheter change due to urinary retention.  76ml of water was removed from the balloon, a 16FR foley cath was removed without difficulty.  Patient was cleaned and prepped in a sterile fashion with betadine and 2% lidocaine jelly was instilled into the urethra. A 16 FR foley cath was replaced into the bladder, no complications were noted. Urine return was noted 38ml and urine was clear in color. The balloon was filled with 68ml of sterile water. A leg bag was attached for drainage.  A night bag was also given to the patient and patient was given instruction on how to change from one bag to another. Patient was given proper instruction on catheter care.    Performed by: Levi Aland, CMA  Follow up: Follow up as scheduled.

## 2022-06-19 ENCOUNTER — Ambulatory Visit: Payer: Medicare Other

## 2022-06-19 LAB — MICROSCOPIC EXAMINATION: Bacteria, UA: NONE SEEN

## 2022-06-19 LAB — URINALYSIS, ROUTINE W REFLEX MICROSCOPIC
Bilirubin, UA: NEGATIVE
Glucose, UA: NEGATIVE
Ketones, UA: NEGATIVE
Nitrite, UA: NEGATIVE
Protein,UA: NEGATIVE
Specific Gravity, UA: 1.015 (ref 1.005–1.030)
Urobilinogen, Ur: 0.2 mg/dL (ref 0.2–1.0)
pH, UA: 7 (ref 5.0–7.5)

## 2022-06-22 ENCOUNTER — Telehealth: Payer: Self-pay

## 2022-06-22 LAB — URINE CULTURE

## 2022-06-22 NOTE — Telephone Encounter (Signed)
Tried calling patient with no answer and unable to leave VM due to mail box being full.

## 2022-06-22 NOTE — Telephone Encounter (Signed)
Made patient aware that her culture is positive but it is resistant to most oral meds. Since she has a foley chronically, I would prefer not to treat this unless she has significant symptoms such as fever or hematuria.  Patient denial any sxs of fever or hematuria and voiced understanding.

## 2022-06-22 NOTE — Telephone Encounter (Signed)
-----   Message from Irine Seal, MD sent at 06/22/2022 10:02 AM EST ----- Her culture is positive but it is resistant to most oral meds.   Since she has a foley chronically, I would prefer not to treat this unless she has significant symptoms such as fever or hematuria.  ----- Message ----- From: Sherrilyn Rist, CMA Sent: 06/22/2022   8:44 AM EST To: Irine Seal, MD  Please review

## 2022-06-23 DIAGNOSIS — I10 Essential (primary) hypertension: Secondary | ICD-10-CM | POA: Diagnosis not present

## 2022-06-23 DIAGNOSIS — Z87891 Personal history of nicotine dependence: Secondary | ICD-10-CM | POA: Diagnosis not present

## 2022-06-23 DIAGNOSIS — J449 Chronic obstructive pulmonary disease, unspecified: Secondary | ICD-10-CM | POA: Diagnosis not present

## 2022-06-23 DIAGNOSIS — E7800 Pure hypercholesterolemia, unspecified: Secondary | ICD-10-CM | POA: Diagnosis not present

## 2022-06-23 DIAGNOSIS — Z7982 Long term (current) use of aspirin: Secondary | ICD-10-CM | POA: Diagnosis not present

## 2022-06-23 DIAGNOSIS — Z881 Allergy status to other antibiotic agents status: Secondary | ICD-10-CM | POA: Diagnosis not present

## 2022-06-23 DIAGNOSIS — L97222 Non-pressure chronic ulcer of left calf with fat layer exposed: Secondary | ICD-10-CM | POA: Diagnosis not present

## 2022-06-23 DIAGNOSIS — Z79899 Other long term (current) drug therapy: Secondary | ICD-10-CM | POA: Diagnosis not present

## 2022-06-23 DIAGNOSIS — I83022 Varicose veins of left lower extremity with ulcer of calf: Secondary | ICD-10-CM | POA: Diagnosis not present

## 2022-06-23 DIAGNOSIS — S81802D Unspecified open wound, left lower leg, subsequent encounter: Secondary | ICD-10-CM | POA: Diagnosis not present

## 2022-06-23 DIAGNOSIS — S81802A Unspecified open wound, left lower leg, initial encounter: Secondary | ICD-10-CM | POA: Diagnosis not present

## 2022-06-29 ENCOUNTER — Other Ambulatory Visit (HOSPITAL_COMMUNITY): Payer: Self-pay | Admitting: Neurosurgery

## 2022-06-29 DIAGNOSIS — M4712 Other spondylosis with myelopathy, cervical region: Secondary | ICD-10-CM | POA: Diagnosis not present

## 2022-07-15 DIAGNOSIS — G894 Chronic pain syndrome: Secondary | ICD-10-CM | POA: Diagnosis not present

## 2022-07-15 DIAGNOSIS — K5903 Drug induced constipation: Secondary | ICD-10-CM | POA: Diagnosis not present

## 2022-07-15 DIAGNOSIS — M961 Postlaminectomy syndrome, not elsewhere classified: Secondary | ICD-10-CM | POA: Diagnosis not present

## 2022-07-15 DIAGNOSIS — Z79891 Long term (current) use of opiate analgesic: Secondary | ICD-10-CM | POA: Diagnosis not present

## 2022-07-15 DIAGNOSIS — M1612 Unilateral primary osteoarthritis, left hip: Secondary | ICD-10-CM | POA: Diagnosis not present

## 2022-07-17 ENCOUNTER — Ambulatory Visit (INDEPENDENT_AMBULATORY_CARE_PROVIDER_SITE_OTHER): Payer: Medicare HMO | Admitting: Urology

## 2022-07-17 DIAGNOSIS — R339 Retention of urine, unspecified: Secondary | ICD-10-CM | POA: Diagnosis not present

## 2022-07-17 NOTE — Progress Notes (Unsigned)
Cath Change/ Replacement  Patient is present today for a catheter change due to urinary retention.  32ml of water was removed from the balloon, a 16FR foley cath was removed without difficulty.  Patient was cleaned and prepped in a sterile fashion with betadine and 2% lidocaine jelly was instilled into the urethra. A 16 FR foley cath was replaced into the bladder, no complications were noted.  Catheter was flushed with 30cc of sterile water with 30cc return.  The balloon was filled with 5ml of sterile water. A leg bag was attached for drainage.  A night bag was also given to the patient and patient was given instruction on how to change from one bag to another. Patient was given proper instruction on catheter care.    Performed by: Levi Aland, CMA  Follow up: Follow up as scheduled.

## 2022-07-21 DIAGNOSIS — Z7982 Long term (current) use of aspirin: Secondary | ICD-10-CM | POA: Diagnosis not present

## 2022-07-21 DIAGNOSIS — Z79899 Other long term (current) drug therapy: Secondary | ICD-10-CM | POA: Diagnosis not present

## 2022-07-21 DIAGNOSIS — L97222 Non-pressure chronic ulcer of left calf with fat layer exposed: Secondary | ICD-10-CM | POA: Diagnosis not present

## 2022-07-21 DIAGNOSIS — J449 Chronic obstructive pulmonary disease, unspecified: Secondary | ICD-10-CM | POA: Diagnosis not present

## 2022-07-21 DIAGNOSIS — I83022 Varicose veins of left lower extremity with ulcer of calf: Secondary | ICD-10-CM | POA: Diagnosis not present

## 2022-07-21 DIAGNOSIS — Z881 Allergy status to other antibiotic agents status: Secondary | ICD-10-CM | POA: Diagnosis not present

## 2022-07-21 DIAGNOSIS — I10 Essential (primary) hypertension: Secondary | ICD-10-CM | POA: Diagnosis not present

## 2022-07-21 DIAGNOSIS — S81802D Unspecified open wound, left lower leg, subsequent encounter: Secondary | ICD-10-CM | POA: Diagnosis not present

## 2022-07-21 DIAGNOSIS — Z87891 Personal history of nicotine dependence: Secondary | ICD-10-CM | POA: Diagnosis not present

## 2022-07-21 DIAGNOSIS — E7800 Pure hypercholesterolemia, unspecified: Secondary | ICD-10-CM | POA: Diagnosis not present

## 2022-07-22 ENCOUNTER — Ambulatory Visit (INDEPENDENT_AMBULATORY_CARE_PROVIDER_SITE_OTHER): Payer: 59

## 2022-07-22 DIAGNOSIS — Z881 Allergy status to other antibiotic agents status: Secondary | ICD-10-CM | POA: Diagnosis not present

## 2022-07-22 DIAGNOSIS — E7800 Pure hypercholesterolemia, unspecified: Secondary | ICD-10-CM | POA: Diagnosis not present

## 2022-07-22 DIAGNOSIS — Z7982 Long term (current) use of aspirin: Secondary | ICD-10-CM | POA: Diagnosis not present

## 2022-07-22 DIAGNOSIS — I83022 Varicose veins of left lower extremity with ulcer of calf: Secondary | ICD-10-CM | POA: Diagnosis not present

## 2022-07-22 DIAGNOSIS — Z79899 Other long term (current) drug therapy: Secondary | ICD-10-CM | POA: Diagnosis not present

## 2022-07-22 DIAGNOSIS — J449 Chronic obstructive pulmonary disease, unspecified: Secondary | ICD-10-CM | POA: Diagnosis not present

## 2022-07-22 DIAGNOSIS — I441 Atrioventricular block, second degree: Secondary | ICD-10-CM | POA: Diagnosis not present

## 2022-07-22 DIAGNOSIS — L97222 Non-pressure chronic ulcer of left calf with fat layer exposed: Secondary | ICD-10-CM | POA: Diagnosis not present

## 2022-07-22 DIAGNOSIS — I10 Essential (primary) hypertension: Secondary | ICD-10-CM | POA: Diagnosis not present

## 2022-07-22 DIAGNOSIS — Z87891 Personal history of nicotine dependence: Secondary | ICD-10-CM | POA: Diagnosis not present

## 2022-07-22 LAB — CUP PACEART REMOTE DEVICE CHECK
Battery Remaining Longevity: 88 mo
Battery Remaining Percentage: 86 %
Battery Voltage: 2.99 V
Brady Statistic AP VP Percent: 21 %
Brady Statistic AP VS Percent: 1.1 %
Brady Statistic AS VP Percent: 50 %
Brady Statistic AS VS Percent: 28 %
Brady Statistic RA Percent Paced: 22 %
Brady Statistic RV Percent Paced: 70 %
Date Time Interrogation Session: 20240207020015
Implantable Lead Connection Status: 753985
Implantable Lead Connection Status: 753985
Implantable Lead Implant Date: 20221109
Implantable Lead Implant Date: 20221109
Implantable Lead Location: 753859
Implantable Lead Location: 753860
Implantable Pulse Generator Implant Date: 20221109
Lead Channel Impedance Value: 380 Ohm
Lead Channel Impedance Value: 400 Ohm
Lead Channel Pacing Threshold Amplitude: 0.75 V
Lead Channel Pacing Threshold Amplitude: 1 V
Lead Channel Pacing Threshold Pulse Width: 0.5 ms
Lead Channel Pacing Threshold Pulse Width: 0.5 ms
Lead Channel Sensing Intrinsic Amplitude: 5 mV
Lead Channel Sensing Intrinsic Amplitude: 8.3 mV
Lead Channel Setting Pacing Amplitude: 2 V
Lead Channel Setting Pacing Amplitude: 2.5 V
Lead Channel Setting Pacing Pulse Width: 0.5 ms
Lead Channel Setting Sensing Sensitivity: 2 mV
Pulse Gen Model: 2272
Pulse Gen Serial Number: 3956509

## 2022-07-28 DIAGNOSIS — J80 Acute respiratory distress syndrome: Secondary | ICD-10-CM | POA: Diagnosis not present

## 2022-07-28 DIAGNOSIS — J449 Chronic obstructive pulmonary disease, unspecified: Secondary | ICD-10-CM | POA: Diagnosis not present

## 2022-07-28 DIAGNOSIS — I10 Essential (primary) hypertension: Secondary | ICD-10-CM | POA: Diagnosis not present

## 2022-07-28 DIAGNOSIS — I350 Nonrheumatic aortic (valve) stenosis: Secondary | ICD-10-CM | POA: Diagnosis not present

## 2022-07-28 DIAGNOSIS — I252 Old myocardial infarction: Secondary | ICD-10-CM | POA: Diagnosis not present

## 2022-07-28 DIAGNOSIS — I83022 Varicose veins of left lower extremity with ulcer of calf: Secondary | ICD-10-CM | POA: Diagnosis not present

## 2022-07-28 DIAGNOSIS — E7800 Pure hypercholesterolemia, unspecified: Secondary | ICD-10-CM | POA: Diagnosis not present

## 2022-07-28 DIAGNOSIS — L97222 Non-pressure chronic ulcer of left calf with fat layer exposed: Secondary | ICD-10-CM | POA: Diagnosis not present

## 2022-07-28 DIAGNOSIS — I2510 Atherosclerotic heart disease of native coronary artery without angina pectoris: Secondary | ICD-10-CM | POA: Diagnosis not present

## 2022-07-29 DIAGNOSIS — Z681 Body mass index (BMI) 19 or less, adult: Secondary | ICD-10-CM | POA: Diagnosis not present

## 2022-07-29 DIAGNOSIS — K5909 Other constipation: Secondary | ICD-10-CM | POA: Diagnosis not present

## 2022-08-04 ENCOUNTER — Ambulatory Visit (HOSPITAL_COMMUNITY)
Admission: RE | Admit: 2022-08-04 | Discharge: 2022-08-04 | Disposition: A | Discharge status: Home / Self Care | Payer: Medicare HMO | Source: Ambulatory Visit | Attending: Neurosurgery | Admitting: Neurosurgery

## 2022-08-04 DIAGNOSIS — M47812 Spondylosis without myelopathy or radiculopathy, cervical region: Secondary | ICD-10-CM | POA: Diagnosis not present

## 2022-08-04 DIAGNOSIS — M4712 Other spondylosis with myelopathy, cervical region: Secondary | ICD-10-CM | POA: Insufficient documentation

## 2022-08-06 DIAGNOSIS — I252 Old myocardial infarction: Secondary | ICD-10-CM | POA: Diagnosis not present

## 2022-08-06 DIAGNOSIS — J449 Chronic obstructive pulmonary disease, unspecified: Secondary | ICD-10-CM | POA: Diagnosis not present

## 2022-08-06 DIAGNOSIS — J80 Acute respiratory distress syndrome: Secondary | ICD-10-CM | POA: Diagnosis not present

## 2022-08-06 DIAGNOSIS — L97222 Non-pressure chronic ulcer of left calf with fat layer exposed: Secondary | ICD-10-CM | POA: Diagnosis not present

## 2022-08-06 DIAGNOSIS — I350 Nonrheumatic aortic (valve) stenosis: Secondary | ICD-10-CM | POA: Diagnosis not present

## 2022-08-06 DIAGNOSIS — I2510 Atherosclerotic heart disease of native coronary artery without angina pectoris: Secondary | ICD-10-CM | POA: Diagnosis not present

## 2022-08-06 DIAGNOSIS — E7800 Pure hypercholesterolemia, unspecified: Secondary | ICD-10-CM | POA: Diagnosis not present

## 2022-08-06 DIAGNOSIS — I83022 Varicose veins of left lower extremity with ulcer of calf: Secondary | ICD-10-CM | POA: Diagnosis not present

## 2022-08-06 DIAGNOSIS — I10 Essential (primary) hypertension: Secondary | ICD-10-CM | POA: Diagnosis not present

## 2022-08-11 DIAGNOSIS — E7800 Pure hypercholesterolemia, unspecified: Secondary | ICD-10-CM | POA: Diagnosis not present

## 2022-08-11 DIAGNOSIS — J80 Acute respiratory distress syndrome: Secondary | ICD-10-CM | POA: Diagnosis not present

## 2022-08-11 DIAGNOSIS — J449 Chronic obstructive pulmonary disease, unspecified: Secondary | ICD-10-CM | POA: Diagnosis not present

## 2022-08-11 DIAGNOSIS — I872 Venous insufficiency (chronic) (peripheral): Secondary | ICD-10-CM | POA: Diagnosis not present

## 2022-08-11 DIAGNOSIS — I10 Essential (primary) hypertension: Secondary | ICD-10-CM | POA: Diagnosis not present

## 2022-08-11 DIAGNOSIS — L97222 Non-pressure chronic ulcer of left calf with fat layer exposed: Secondary | ICD-10-CM | POA: Diagnosis not present

## 2022-08-11 DIAGNOSIS — I2510 Atherosclerotic heart disease of native coronary artery without angina pectoris: Secondary | ICD-10-CM | POA: Diagnosis not present

## 2022-08-11 DIAGNOSIS — I252 Old myocardial infarction: Secondary | ICD-10-CM | POA: Diagnosis not present

## 2022-08-11 DIAGNOSIS — I83022 Varicose veins of left lower extremity with ulcer of calf: Secondary | ICD-10-CM | POA: Diagnosis not present

## 2022-08-11 DIAGNOSIS — I350 Nonrheumatic aortic (valve) stenosis: Secondary | ICD-10-CM | POA: Diagnosis not present

## 2022-08-13 ENCOUNTER — Ambulatory Visit: Payer: Medicare HMO

## 2022-08-13 DIAGNOSIS — E039 Hypothyroidism, unspecified: Secondary | ICD-10-CM | POA: Diagnosis not present

## 2022-08-13 DIAGNOSIS — I2510 Atherosclerotic heart disease of native coronary artery without angina pectoris: Secondary | ICD-10-CM | POA: Diagnosis not present

## 2022-08-13 DIAGNOSIS — K5903 Drug induced constipation: Secondary | ICD-10-CM | POA: Diagnosis not present

## 2022-08-13 DIAGNOSIS — M961 Postlaminectomy syndrome, not elsewhere classified: Secondary | ICD-10-CM | POA: Diagnosis not present

## 2022-08-13 DIAGNOSIS — K219 Gastro-esophageal reflux disease without esophagitis: Secondary | ICD-10-CM | POA: Diagnosis not present

## 2022-08-13 DIAGNOSIS — M1612 Unilateral primary osteoarthritis, left hip: Secondary | ICD-10-CM | POA: Diagnosis not present

## 2022-08-13 DIAGNOSIS — Z79891 Long term (current) use of opiate analgesic: Secondary | ICD-10-CM | POA: Diagnosis not present

## 2022-08-13 DIAGNOSIS — I10 Essential (primary) hypertension: Secondary | ICD-10-CM | POA: Diagnosis not present

## 2022-08-13 DIAGNOSIS — G894 Chronic pain syndrome: Secondary | ICD-10-CM | POA: Diagnosis not present

## 2022-08-18 DIAGNOSIS — I2510 Atherosclerotic heart disease of native coronary artery without angina pectoris: Secondary | ICD-10-CM | POA: Diagnosis not present

## 2022-08-18 DIAGNOSIS — J449 Chronic obstructive pulmonary disease, unspecified: Secondary | ICD-10-CM | POA: Diagnosis not present

## 2022-08-18 DIAGNOSIS — E7800 Pure hypercholesterolemia, unspecified: Secondary | ICD-10-CM | POA: Diagnosis not present

## 2022-08-18 DIAGNOSIS — I10 Essential (primary) hypertension: Secondary | ICD-10-CM | POA: Diagnosis not present

## 2022-08-18 DIAGNOSIS — J80 Acute respiratory distress syndrome: Secondary | ICD-10-CM | POA: Diagnosis not present

## 2022-08-18 DIAGNOSIS — L97222 Non-pressure chronic ulcer of left calf with fat layer exposed: Secondary | ICD-10-CM | POA: Diagnosis not present

## 2022-08-18 DIAGNOSIS — I252 Old myocardial infarction: Secondary | ICD-10-CM | POA: Diagnosis not present

## 2022-08-18 DIAGNOSIS — I83022 Varicose veins of left lower extremity with ulcer of calf: Secondary | ICD-10-CM | POA: Diagnosis not present

## 2022-08-18 DIAGNOSIS — I350 Nonrheumatic aortic (valve) stenosis: Secondary | ICD-10-CM | POA: Diagnosis not present

## 2022-08-18 NOTE — Progress Notes (Signed)
Remote pacemaker transmission.   

## 2022-08-19 DIAGNOSIS — I10 Essential (primary) hypertension: Secondary | ICD-10-CM | POA: Diagnosis not present

## 2022-08-19 DIAGNOSIS — J449 Chronic obstructive pulmonary disease, unspecified: Secondary | ICD-10-CM | POA: Diagnosis not present

## 2022-08-19 DIAGNOSIS — I252 Old myocardial infarction: Secondary | ICD-10-CM | POA: Diagnosis not present

## 2022-08-19 DIAGNOSIS — I350 Nonrheumatic aortic (valve) stenosis: Secondary | ICD-10-CM | POA: Diagnosis not present

## 2022-08-19 DIAGNOSIS — I2510 Atherosclerotic heart disease of native coronary artery without angina pectoris: Secondary | ICD-10-CM | POA: Diagnosis not present

## 2022-08-19 DIAGNOSIS — L97222 Non-pressure chronic ulcer of left calf with fat layer exposed: Secondary | ICD-10-CM | POA: Diagnosis not present

## 2022-08-19 DIAGNOSIS — E7800 Pure hypercholesterolemia, unspecified: Secondary | ICD-10-CM | POA: Diagnosis not present

## 2022-08-19 DIAGNOSIS — J80 Acute respiratory distress syndrome: Secondary | ICD-10-CM | POA: Diagnosis not present

## 2022-08-19 DIAGNOSIS — I83022 Varicose veins of left lower extremity with ulcer of calf: Secondary | ICD-10-CM | POA: Diagnosis not present

## 2022-08-25 DIAGNOSIS — I83022 Varicose veins of left lower extremity with ulcer of calf: Secondary | ICD-10-CM | POA: Diagnosis not present

## 2022-08-25 DIAGNOSIS — E039 Hypothyroidism, unspecified: Secondary | ICD-10-CM | POA: Diagnosis not present

## 2022-08-25 DIAGNOSIS — L97222 Non-pressure chronic ulcer of left calf with fat layer exposed: Secondary | ICD-10-CM | POA: Diagnosis not present

## 2022-08-25 DIAGNOSIS — Z87891 Personal history of nicotine dependence: Secondary | ICD-10-CM | POA: Diagnosis not present

## 2022-08-25 DIAGNOSIS — I2510 Atherosclerotic heart disease of native coronary artery without angina pectoris: Secondary | ICD-10-CM | POA: Diagnosis not present

## 2022-08-25 DIAGNOSIS — E7800 Pure hypercholesterolemia, unspecified: Secondary | ICD-10-CM | POA: Diagnosis not present

## 2022-08-25 DIAGNOSIS — Z79899 Other long term (current) drug therapy: Secondary | ICD-10-CM | POA: Diagnosis not present

## 2022-08-25 DIAGNOSIS — I10 Essential (primary) hypertension: Secondary | ICD-10-CM | POA: Diagnosis not present

## 2022-08-25 DIAGNOSIS — J449 Chronic obstructive pulmonary disease, unspecified: Secondary | ICD-10-CM | POA: Diagnosis not present

## 2022-09-01 DIAGNOSIS — Z79899 Other long term (current) drug therapy: Secondary | ICD-10-CM | POA: Diagnosis not present

## 2022-09-01 DIAGNOSIS — L97222 Non-pressure chronic ulcer of left calf with fat layer exposed: Secondary | ICD-10-CM | POA: Diagnosis not present

## 2022-09-01 DIAGNOSIS — E039 Hypothyroidism, unspecified: Secondary | ICD-10-CM | POA: Diagnosis not present

## 2022-09-01 DIAGNOSIS — J449 Chronic obstructive pulmonary disease, unspecified: Secondary | ICD-10-CM | POA: Diagnosis not present

## 2022-09-01 DIAGNOSIS — I83022 Varicose veins of left lower extremity with ulcer of calf: Secondary | ICD-10-CM | POA: Diagnosis not present

## 2022-09-01 DIAGNOSIS — I2510 Atherosclerotic heart disease of native coronary artery without angina pectoris: Secondary | ICD-10-CM | POA: Diagnosis not present

## 2022-09-01 DIAGNOSIS — I10 Essential (primary) hypertension: Secondary | ICD-10-CM | POA: Diagnosis not present

## 2022-09-01 DIAGNOSIS — Z87891 Personal history of nicotine dependence: Secondary | ICD-10-CM | POA: Diagnosis not present

## 2022-09-01 DIAGNOSIS — E7800 Pure hypercholesterolemia, unspecified: Secondary | ICD-10-CM | POA: Diagnosis not present

## 2022-09-06 DIAGNOSIS — T83511A Infection and inflammatory reaction due to indwelling urethral catheter, initial encounter: Secondary | ICD-10-CM | POA: Diagnosis not present

## 2022-09-06 DIAGNOSIS — N390 Urinary tract infection, site not specified: Secondary | ICD-10-CM | POA: Diagnosis not present

## 2022-09-06 DIAGNOSIS — I2510 Atherosclerotic heart disease of native coronary artery without angina pectoris: Secondary | ICD-10-CM | POA: Diagnosis not present

## 2022-09-06 DIAGNOSIS — R112 Nausea with vomiting, unspecified: Secondary | ICD-10-CM | POA: Diagnosis not present

## 2022-09-06 DIAGNOSIS — Z960 Presence of urogenital implants: Secondary | ICD-10-CM | POA: Diagnosis not present

## 2022-09-06 DIAGNOSIS — Z87891 Personal history of nicotine dependence: Secondary | ICD-10-CM | POA: Diagnosis not present

## 2022-09-06 DIAGNOSIS — E039 Hypothyroidism, unspecified: Secondary | ICD-10-CM | POA: Diagnosis not present

## 2022-09-06 DIAGNOSIS — Z79890 Hormone replacement therapy (postmenopausal): Secondary | ICD-10-CM | POA: Diagnosis not present

## 2022-09-06 DIAGNOSIS — Z79899 Other long term (current) drug therapy: Secondary | ICD-10-CM | POA: Diagnosis not present

## 2022-09-06 DIAGNOSIS — Z881 Allergy status to other antibiotic agents status: Secondary | ICD-10-CM | POA: Diagnosis not present

## 2022-09-06 DIAGNOSIS — E7800 Pure hypercholesterolemia, unspecified: Secondary | ICD-10-CM | POA: Diagnosis not present

## 2022-09-10 ENCOUNTER — Ambulatory Visit: Payer: Medicare HMO

## 2022-09-17 DIAGNOSIS — G894 Chronic pain syndrome: Secondary | ICD-10-CM | POA: Diagnosis not present

## 2022-09-17 DIAGNOSIS — K5903 Drug induced constipation: Secondary | ICD-10-CM | POA: Diagnosis not present

## 2022-09-17 DIAGNOSIS — Z79891 Long term (current) use of opiate analgesic: Secondary | ICD-10-CM | POA: Diagnosis not present

## 2022-09-17 DIAGNOSIS — M1612 Unilateral primary osteoarthritis, left hip: Secondary | ICD-10-CM | POA: Diagnosis not present

## 2022-09-17 DIAGNOSIS — M961 Postlaminectomy syndrome, not elsewhere classified: Secondary | ICD-10-CM | POA: Diagnosis not present

## 2022-09-28 ENCOUNTER — Encounter (HOSPITAL_COMMUNITY): Payer: Self-pay | Admitting: Emergency Medicine

## 2022-09-28 ENCOUNTER — Emergency Department (HOSPITAL_COMMUNITY)
Admission: EM | Admit: 2022-09-28 | Discharge: 2022-09-28 | Disposition: A | Discharge status: Home / Self Care | Payer: Medicare HMO | Attending: Emergency Medicine | Admitting: Emergency Medicine

## 2022-09-28 ENCOUNTER — Other Ambulatory Visit: Payer: Self-pay

## 2022-09-28 ENCOUNTER — Other Ambulatory Visit: Payer: Self-pay | Admitting: Internal Medicine

## 2022-09-28 ENCOUNTER — Ambulatory Visit (HOSPITAL_COMMUNITY)
Admission: RE | Admit: 2022-09-28 | Discharge: 2022-09-28 | Disposition: A | Discharge status: Home / Self Care | Payer: Medicare HMO | Source: Ambulatory Visit | Attending: Internal Medicine | Admitting: Internal Medicine

## 2022-09-28 DIAGNOSIS — M79605 Pain in left leg: Secondary | ICD-10-CM | POA: Insufficient documentation

## 2022-09-28 DIAGNOSIS — Z7902 Long term (current) use of antithrombotics/antiplatelets: Secondary | ICD-10-CM | POA: Diagnosis not present

## 2022-09-28 DIAGNOSIS — I82402 Acute embolism and thrombosis of unspecified deep veins of left lower extremity: Secondary | ICD-10-CM | POA: Insufficient documentation

## 2022-09-28 DIAGNOSIS — M7989 Other specified soft tissue disorders: Secondary | ICD-10-CM | POA: Diagnosis present

## 2022-09-28 DIAGNOSIS — R600 Localized edema: Secondary | ICD-10-CM | POA: Diagnosis not present

## 2022-09-28 DIAGNOSIS — R030 Elevated blood-pressure reading, without diagnosis of hypertension: Secondary | ICD-10-CM | POA: Diagnosis not present

## 2022-09-28 DIAGNOSIS — N390 Urinary tract infection, site not specified: Secondary | ICD-10-CM | POA: Diagnosis not present

## 2022-09-28 DIAGNOSIS — Z7982 Long term (current) use of aspirin: Secondary | ICD-10-CM | POA: Insufficient documentation

## 2022-09-28 DIAGNOSIS — M25562 Pain in left knee: Secondary | ICD-10-CM | POA: Diagnosis not present

## 2022-09-28 DIAGNOSIS — Z681 Body mass index (BMI) 19 or less, adult: Secondary | ICD-10-CM | POA: Diagnosis not present

## 2022-09-28 DIAGNOSIS — I824Z2 Acute embolism and thrombosis of unspecified deep veins of left distal lower extremity: Secondary | ICD-10-CM

## 2022-09-28 LAB — CBC WITH DIFFERENTIAL/PLATELET
Abs Immature Granulocytes: 0.02 10*3/uL (ref 0.00–0.07)
Basophils Absolute: 0 10*3/uL (ref 0.0–0.1)
Basophils Relative: 0 %
Eosinophils Absolute: 0.1 10*3/uL (ref 0.0–0.5)
Eosinophils Relative: 1 %
HCT: 31.2 % — ABNORMAL LOW (ref 36.0–46.0)
Hemoglobin: 10.1 g/dL — ABNORMAL LOW (ref 12.0–15.0)
Immature Granulocytes: 0 %
Lymphocytes Relative: 15 %
Lymphs Abs: 1 10*3/uL (ref 0.7–4.0)
MCH: 30.4 pg (ref 26.0–34.0)
MCHC: 32.4 g/dL (ref 30.0–36.0)
MCV: 94 fL (ref 80.0–100.0)
Monocytes Absolute: 0.7 10*3/uL (ref 0.1–1.0)
Monocytes Relative: 12 %
Neutro Abs: 4.4 10*3/uL (ref 1.7–7.7)
Neutrophils Relative %: 72 %
Platelets: 182 10*3/uL (ref 150–400)
RBC: 3.32 MIL/uL — ABNORMAL LOW (ref 3.87–5.11)
RDW: 14.4 % (ref 11.5–15.5)
WBC: 6.3 10*3/uL (ref 4.0–10.5)
nRBC: 0 % (ref 0.0–0.2)

## 2022-09-28 LAB — BASIC METABOLIC PANEL
Anion gap: 9 (ref 5–15)
BUN: 15 mg/dL (ref 8–23)
CO2: 26 mmol/L (ref 22–32)
Calcium: 8.2 mg/dL — ABNORMAL LOW (ref 8.9–10.3)
Chloride: 106 mmol/L (ref 98–111)
Creatinine, Ser: 0.53 mg/dL (ref 0.44–1.00)
GFR, Estimated: >60 mL/min (ref >60)
Glucose, Bld: 115 mg/dL — ABNORMAL HIGH (ref 70–99)
Potassium: 3.8 mmol/L (ref 3.5–5.1)
Sodium: 141 mmol/L (ref 135–145)

## 2022-09-28 LAB — PROTIME-INR
INR: 1 (ref 0.8–1.2)
Prothrombin Time: 13.5 seconds (ref 11.4–15.2)

## 2022-09-28 MED ORDER — APIXABAN (ELIQUIS) VTE STARTER PACK (10MG AND 5MG): ORAL_TABLET | ORAL | 0 refills | Status: DC

## 2022-09-28 NOTE — Discharge Instructions (Addendum)
You were seen in the emergency department for partial blood clot in your left lower leg.  This may require a blood thinner.  Please call your primary care doctor tomorrow to review this before starting the medication.  You may have to adjust other medications and so do not start this medication without reviewing with your doctor. Radiology Impression: I discussed these images personally with Dr. Mayford Knife. Both of the paired peroneal veins demonstrate nonocclusive thrombus. This does not extend into the popliteal vein itself and this study is, therefore, positive for infrapopliteal DVT only.

## 2022-09-28 NOTE — ED Provider Notes (Signed)
[place] EMERGENCY DEPARTMENT AT [facility] Provider Note   CSN: [id number] Arrival date & time: 09/28/22  2017     History {Add pertinent medical, surgical, social history, OB history to HPI:1} Chief Complaint  Patient presents with   Leg Swelling    Mckenzie [name] is a 87 y.o. female.  She had a fall over a month ago which an injury to her left lower leg.  It has been very slow to heal and continues to be swollen.  Her question primary care doctor ordered an outpatient venous duplex today and she received a call that she needed to come to the emergency department to get started on blood thinners.  She said her leg swelling is no better no worse.  She denies any shortness of breath or chest pain.  She takes a daily aspirin along with her regular medications.  The history is provided by the patient.  Leg Pain Location:  Leg Time since incident:  1 month Injury: yes   Leg location:  L lower leg Pain details:    Progression:  Unchanged Associated symptoms: swelling   Associated symptoms: no fever        Home Medications Prior to Admission medications   Medication Sig Start Date End Date Taking? Authorizing Provider  aspirin EC 81 MG EC tablet Take 1 tablet (81 mg total) by mouth daily. Swallow whole. 10/07/21   Catarina Hartshorn, MD  atorvastatin (LIPITOR) 40 MG tablet Take 1 tablet (40 mg total) by mouth daily. 10/06/21   Catarina Hartshorn, MD  benazepril (LOTENSIN) 20 MG tablet Take 1 tablet (20 mg total) by mouth daily. 04/25/21 05/25/21  Hughie Closs, MD  Calcium Carbonate (CALCIUM 600 PO) Take 1,200 mg by mouth daily.    [provider]  Cholecalciferol (VITAMIN D3) 1000 UNITS CAPS Take 1,000 Units by mouth daily. Take one tab daily    [provider]  clopidogrel (PLAVIX) 75 MG tablet Take 1 tablet (75 mg total) by mouth daily. 10/07/21   Catarina Hartshorn, MD  conjugated estrogens (PREMARIN) vaginal cream Place 1 Applicatorful vaginally daily.    [provider]  diphenhydrAMINE (BENADRYL) 25 mg capsule Take 25 mg by mouth daily as needed for sleep.    [provider]  FERREX 150 150 MG capsule Take 150 mg by mouth daily. 04/25/21   [provider]  HYDROcodone-acetaminophen (NORCO) 10-325 MG tablet Take 1 tablet by mouth every 4 (four) hours as needed for severe pain. 10/11/20   Love, Evlyn Kanner, PA-C  leptospermum manuka honey (MEDIHONEY) PSTE paste Apply 1 application. topically daily. 10/07/21   Catarina Hartshorn, MD  levothyroxine (SYNTHROID) 25 MCG tablet TAKE 1 TABLET BY MOUTH DAILY AT 6 AM. Patient taking differently: Take 25 mcg by mouth daily before breakfast. 06/10/21   Raulkar, Drema Pry, MD  Melatonin 10 MG TABS Take 1 tablet by mouth at bedtime.    [provider]  metoprolol succinate (TOPROL-XL) 25 MG 24 hr tablet Take 25 mg by mouth daily. 09/29/21   [provider]  MOVANTIK 25 MG TABS tablet Take 25 mg by mouth daily. 09/16/20   [provider]  Multiple Vitamin (MULTIVITAMIN) tablet Take 1 tablet by mouth daily.    [provider]  nitroGLYCERIN (NITROSTAT) 0.4 MG SL tablet Place under the tongue.    [provider]  polyethylene glycol (MIRALAX / GLYCOLAX) 17 g packet Take 17 g by mouth every morning.    [provider]  raloxifene (EVISTA) 60 MG tablet Take 60 mg by mouth daily. 04/13/19   [provider]  tamsulosin (FLOMAX) 0.4 MG CAPS capsule Take 0.4 mg by mouth daily. 04/06/19   [provider]  temazepam (RESTORIL) 30 MG capsule Take 30 mg by mouth at bedtime.    [provider]  tretinoin (RETIN-A) 0.05 % cream Apply 1 application topically at bedtime.    [provider]      Allergies    Tetracycline    Review of Systems   Review of Systems  Constitutional:  Negative for fever.  Respiratory:  Negative for shortness of breath.   Cardiovascular:  Positive for leg swelling. Negative for chest pain.    Physical  Exam Updated Vital Signs BP (!) 179/58   Pulse 70   Temp 98.9 F (37.2 C) (Oral)   Resp 18   Wt 49.9 kg   SpO2 98%   BMI 19.49 kg/m  Physical Exam Vitals and nursing note reviewed.  Constitutional:      General: She is not in acute distress.    Appearance: Normal appearance. She is well-developed.  HENT:     Head: Normocephalic and atraumatic.  Eyes:     Conjunctiva/sclera: Conjunctivae normal.  Cardiovascular:     Rate and Rhythm: Normal rate and regular rhythm.     Heart sounds: Murmur heard.  Pulmonary:     Effort: Pulmonary effort is normal.     Breath sounds: Normal breath sounds. No wheezing.  Abdominal:     Palpations: Abdomen is soft.     Tenderness: There is no abdominal tenderness. There is no guarding or rebound.  Musculoskeletal:        General: Tenderness present. Normal range of motion.     Cervical back: Neck supple.     Left lower leg: Edema present.     Comments: She has asymmetric swelling of her left lower extremity.  She has some chronic stasis changes.  I do not appreciate any cords.  Skin:    General: Skin is warm and dry.  Neurological:     General: No focal deficit present.     Mental Status: She is alert.     GCS: GCS eye subscore is 4. GCS verbal subscore is 5. GCS motor subscore is 6.     ED Results / Procedures / Treatments   Labs (all labs ordered are listed, but only abnormal results are displayed) Labs Reviewed  CBC WITH DIFFERENTIAL/PLATELET - Abnormal; Notable for the following components:      Result Value   RBC 3.32 (*)    Hemoglobin 10.1 (*)    HCT 31.2 (*)    All other components within normal limits  BASIC METABOLIC PANEL  PROTIME-INR    EKG None  Radiology US Venous Img Lower Unilateral Left (DVT)  Addendum Date: 09/28/2022   ADDENDUM REPORT: 09/28/2022 17:36 ADDENDUM: I discussed these images personally with Dr. Mayford Knife. Both of the paired peroneal veins demonstrate nonocclusive thrombus. This does not extend into  the popliteal vein itself and this study is, therefore, positive for infrapopliteal DVT only. Electronically Signed   By: Helyn Numbers M.D.   On: 09/28/2022 17:36   Result Date: 09/28/2022 CLINICAL DATA:  Left leg pain EXAM: LEFT LOWER EXTREMITY VENOUS DOPPLER ULTRASOUND TECHNIQUE: Gray-scale sonography with graded compression, as well as color Doppler and duplex ultrasound were performed to evaluate the lower extremity deep venous systems from the level of the common femoral vein and including the common  femoral, femoral, profunda femoral, popliteal and calf veins including the posterior tibial, peroneal and gastrocnemius veins when visible. The superficial great saphenous vein was also interrogated. Spectral Doppler was utilized to evaluate flow at rest and with distal augmentation maneuvers in the common femoral, femoral and popliteal veins. COMPARISON:  None Available. FINDINGS: Contralateral Common Femoral Vein: Respiratory phasicity is normal and symmetric with the symptomatic side. No evidence of thrombus. Normal compressibility. Common Femoral Vein: No evidence of thrombus. Normal compressibility, respiratory phasicity and response to augmentation. Saphenofemoral Junction: No evidence of thrombus. Normal compressibility and flow on color Doppler imaging. Profunda Femoral Vein: No evidence of thrombus. Normal compressibility and flow on color Doppler imaging. Femoral Vein: No evidence of thrombus. Normal compressibility, respiratory phasicity and response to augmentation. Popliteal Vein: No evidence of thrombus. Normal compressibility, respiratory phasicity and response to augmentation. Calf Veins: Poor color fill in the peroneal vein with suggestion thrombus eccentric Leigh (nonocclusive). Posterior tibial vein is patent. IMPRESSION: Calf edema limits assessment with possible nonocclusive clot in the peroneal vein in the calf. These results will be called to the ordering clinician or representative by  the Radiologist Assistant, and communication documented in the PACS or [employer]. Electronically Signed: By: Feliberto Harts M.D. On: 09/28/2022 17:07    Procedures Procedures  {Document cardiac monitor, telemetry assessment procedure when appropriate:1}  Medications Ordered in ED Medications - No data to display  ED Course/ Medical Decision Making/ A&P   {   Click here for ABCD2, HEART and other calculatorsREFRESH Note before signing :1}                          Medical Decision Making Amount and/or Complexity of Data Reviewed Labs: ordered.   This patient complains of ***; this involves an extensive number of treatment Options and is a complaint that carries with it a high risk of complications and morbidity. The differential includes ***  I ordered, reviewed and interpreted labs, which included *** I ordered medication *** and reviewed PMP when indicated. I ordered imaging studies which included *** and I independently    visualized and interpreted imaging which showed *** Additional history obtained from *** Previous records obtained and reviewed *** I consulted *** and discussed lab and imaging findings and discussed disposition.  Cardiac monitoring reviewed, *** Social determinants considered, *** Critical Interventions: ***  After the interventions stated above, I reevaluated the patient and found *** Admission and further testing considered, ***   {Document critical care time when appropriate:1} {Document review of labs and clinical decision tools ie heart score, Chads2Vasc2 etc:1}  {Document your independent review of radiology images, and any outside records:1} {Document your discussion with family members, caretakers, and with consultants:1} {Document social determinants of health affecting pt's care:1} {Document your decision making why or why not admission, treatments were needed:1} Final Clinical Impression(s) / ED Diagnoses Final diagnoses:  None     Rx / DC Orders ED Discharge Orders     None

## 2022-09-28 NOTE — ED Triage Notes (Signed)
Left lower leg swelling x 1 month. Had [place] today and called to come back to hospital for blood thinners due to a clot.

## 2022-09-30 DIAGNOSIS — I82409 Acute embolism and thrombosis of unspecified deep veins of unspecified lower extremity: Secondary | ICD-10-CM | POA: Diagnosis not present

## 2022-09-30 DIAGNOSIS — Z681 Body mass index (BMI) 19 or less, adult: Secondary | ICD-10-CM | POA: Diagnosis not present

## 2022-09-30 DIAGNOSIS — M79605 Pain in left leg: Secondary | ICD-10-CM | POA: Diagnosis not present

## 2022-09-30 DIAGNOSIS — R030 Elevated blood-pressure reading, without diagnosis of hypertension: Secondary | ICD-10-CM | POA: Diagnosis not present

## 2022-09-30 DIAGNOSIS — N390 Urinary tract infection, site not specified: Secondary | ICD-10-CM | POA: Diagnosis not present

## 2022-10-05 ENCOUNTER — Emergency Department (HOSPITAL_COMMUNITY)
Admission: EM | Admit: 2022-10-05 | Discharge: 2022-10-05 | Disposition: A | Discharge status: Home / Self Care | Payer: Medicare HMO | Attending: Emergency Medicine | Admitting: Emergency Medicine

## 2022-10-05 ENCOUNTER — Other Ambulatory Visit: Payer: Self-pay

## 2022-10-05 ENCOUNTER — Emergency Department (HOSPITAL_COMMUNITY): Payer: Medicare HMO

## 2022-10-05 ENCOUNTER — Encounter (HOSPITAL_COMMUNITY): Payer: Self-pay | Admitting: Emergency Medicine

## 2022-10-05 DIAGNOSIS — Z7982 Long term (current) use of aspirin: Secondary | ICD-10-CM | POA: Diagnosis not present

## 2022-10-05 DIAGNOSIS — E039 Hypothyroidism, unspecified: Secondary | ICD-10-CM | POA: Insufficient documentation

## 2022-10-05 DIAGNOSIS — I82551 Chronic embolism and thrombosis of right peroneal vein: Secondary | ICD-10-CM | POA: Diagnosis not present

## 2022-10-05 DIAGNOSIS — J449 Chronic obstructive pulmonary disease, unspecified: Secondary | ICD-10-CM | POA: Diagnosis not present

## 2022-10-05 DIAGNOSIS — Z79899 Other long term (current) drug therapy: Secondary | ICD-10-CM | POA: Insufficient documentation

## 2022-10-05 DIAGNOSIS — Z79890 Hormone replacement therapy (postmenopausal): Secondary | ICD-10-CM | POA: Insufficient documentation

## 2022-10-05 DIAGNOSIS — Z86718 Personal history of other venous thrombosis and embolism: Secondary | ICD-10-CM | POA: Insufficient documentation

## 2022-10-05 DIAGNOSIS — I2510 Atherosclerotic heart disease of native coronary artery without angina pectoris: Secondary | ICD-10-CM | POA: Diagnosis not present

## 2022-10-05 DIAGNOSIS — R600 Localized edema: Secondary | ICD-10-CM | POA: Insufficient documentation

## 2022-10-05 DIAGNOSIS — I10 Essential (primary) hypertension: Secondary | ICD-10-CM | POA: Diagnosis not present

## 2022-10-05 DIAGNOSIS — R2242 Localized swelling, mass and lump, left lower limb: Secondary | ICD-10-CM | POA: Diagnosis not present

## 2022-10-05 DIAGNOSIS — Z7902 Long term (current) use of antithrombotics/antiplatelets: Secondary | ICD-10-CM | POA: Insufficient documentation

## 2022-10-05 DIAGNOSIS — M7989 Other specified soft tissue disorders: Secondary | ICD-10-CM | POA: Diagnosis present

## 2022-10-05 NOTE — ED Provider Notes (Signed)
[place] EMERGENCY DEPARTMENT AT [facility] Provider Note   CSN: [id number] Arrival date & time: 10/05/22  7829     History  Chief Complaint  Patient presents with   Leg Swelling    Mckenzie [name] is a 87 y.o. female.  HPI      Mckenzie [name] is a 87 y.o. female with past medical history of hypothyroidism, mitral regurgitation, coronary artery disease, COPD, hypertension and diagnosed with left lower extremity DVT on 09/28/2022 started on Eliquis who presents to the Emergency Department under the advisement of her PCP for increased swelling of her left lower leg.  She denies any missed doses of her Eliquis, fever, swelling or pain of her right lower extremity, chest pain or shortness of breath.  Home Medications Prior to Admission medications   Medication Sig Start Date End Date Taking? Authorizing Provider  APIXABAN (ELIQUIS) VTE STARTER PACK (  AND ) Take as directed on package: start with two-5mg  tablets twice daily for 7 days. On day 8, switch to one-5mg  tablet twice daily. 09/28/22   Terrilee Files, MD  aspirin EC 81 MG EC tablet Take 1 tablet (81 mg total) by mouth daily. Swallow whole. 10/07/21   Catarina Hartshorn, MD  atorvastatin (LIPITOR) 40 MG tablet Take 1 tablet (40 mg total) by mouth daily. 10/06/21   Catarina Hartshorn, MD  benazepril (LOTENSIN) 20 MG tablet Take 1 tablet (20 mg total) by mouth daily. 04/25/21 05/25/21  Hughie Closs, MD  Calcium Carbonate (CALCIUM 600 PO) Take 1,200 mg by mouth daily.    [provider]  Cholecalciferol (VITAMIN D3) 1000 UNITS CAPS Take 1,000 Units by mouth daily. Take one tab daily    [provider]  clopidogrel (PLAVIX) 75 MG tablet Take 1 tablet (75 mg total) by mouth daily. 10/07/21   Catarina Hartshorn, MD  conjugated estrogens (PREMARIN) vaginal cream Place 1 Applicatorful vaginally daily.    [provider]  diphenhydrAMINE (BENADRYL) 25 mg capsule Take 25 mg by mouth daily as needed for sleep.     [provider]  FERREX 150 150 MG capsule Take 150 mg by mouth daily. 04/25/21   [provider]  HYDROcodone-acetaminophen (NORCO) 10-325 MG tablet Take 1 tablet by mouth every 4 (four) hours as needed for severe pain. 10/11/20   Love, Evlyn Kanner, PA-C  leptospermum manuka honey (MEDIHONEY) PSTE paste Apply 1 application. topically daily. 10/07/21   Catarina Hartshorn, MD  levothyroxine (SYNTHROID) 25 MCG tablet TAKE 1 TABLET BY MOUTH DAILY AT 6 AM. Patient taking differently: Take 25 mcg by mouth daily before breakfast. 06/10/21   Raulkar, Drema Pry, MD  Melatonin 10 MG TABS Take 1 tablet by mouth at bedtime.    [provider]  metoprolol succinate (TOPROL-XL) 25 MG 24 hr tablet Take 25 mg by mouth daily. 09/29/21   [provider]  MOVANTIK 25 MG TABS tablet Take 25 mg by mouth daily. 09/16/20   [provider]  Multiple Vitamin (MULTIVITAMIN) tablet Take 1 tablet by mouth daily.    [provider]  nitroGLYCERIN (NITROSTAT) 0.4 MG SL tablet Place under the tongue.    [provider]  polyethylene glycol (MIRALAX / GLYCOLAX) 17 g packet Take 17 g by mouth every morning.    [provider]  raloxifene (EVISTA) 60 MG tablet Take 60 mg by mouth daily. 04/13/19   [provider]  tamsulosin (FLOMAX) 0.4 MG CAPS capsule Take 0.4 mg by mouth daily. 04/06/19  [provider]  temazepam (RESTORIL) 30 MG capsule Take 30 mg by mouth at bedtime.    [provider]  tretinoin (RETIN-A) 0.05 % cream Apply 1 application topically at bedtime.    [provider]      Allergies    Tetracycline    Review of Systems   Review of Systems  Constitutional:  Negative for chills and fever.  Respiratory:  Negative for shortness of breath.   Cardiovascular:  Negative for chest pain.  Gastrointestinal:  Negative for abdominal pain, diarrhea and nausea.  Musculoskeletal:  Positive for myalgias (Pain and swelling left  lower EXTR remedy).  Neurological:  Negative for dizziness, weakness and numbness.    Physical Exam Updated Vital Signs BP (!) 197/70 (BP Location: Right Arm) Comment: has already took bp meds today  Pulse 68   Temp 98.4 F (36.9 C) (Oral)   Resp 19   SpO2 96%  Physical Exam Vitals and nursing note reviewed.  Constitutional:      General: She is not in acute distress.    Appearance: Normal appearance. She is not ill-appearing or toxic-appearing.  Cardiovascular:     Rate and Rhythm: Normal rate and regular rhythm.     Pulses: Normal pulses.  Pulmonary:     Effort: Pulmonary effort is normal.  Chest:     Chest wall: No tenderness.  Musculoskeletal:        General: Swelling and tenderness present. No deformity.     Right lower leg: No edema.     Left lower leg: Edema present.     Comments: Mild erythema and warmth of the left lower extremity.  Skin of the lower extremity appears shiny.  Edema extends from dorsal foot to knee.  Negative Homans' sign, toes warm and pink.  Skin:    General: Skin is warm.     Capillary Refill: Capillary refill takes 2 to 3 seconds.     Findings: Erythema present. No rash.  Neurological:     General: No focal deficit present.     Mental Status: She is alert.     Motor: No weakness.     Coordination: Coordination normal.     ED Results / Procedures / Treatments   Labs (all labs ordered are listed, but only abnormal results are displayed) Labs Reviewed - No data to display  EKG None  Radiology US Venous Img Lower Unilateral Left  Result Date: 10/05/2022 CLINICAL DATA:  History of left leg pain with known nonocclusive calf peroneal DVT EXAM: LEFT LOWER EXTREMITY VENOUS DOPPLER ULTRASOUND TECHNIQUE: Gray-scale sonography with graded compression, as well as color Doppler and duplex ultrasound were performed to evaluate the lower extremity deep venous systems from the level of the common femoral vein and including the common femoral, femoral,  profunda femoral, popliteal and calf veins including the posterior tibial, peroneal and gastrocnemius veins when visible. Spectral Doppler was utilized to evaluate flow at rest and with distal augmentation maneuvers in the common femoral, femoral and popliteal veins. COMPARISON:  09/28/2022 FINDINGS: Contralateral Common Femoral Vein: Respiratory phasicity is normal and symmetric with the symptomatic side. No evidence of thrombus. Normal compressibility. Common Femoral Vein: No evidence of thrombus. Normal compressibility, respiratory phasicity and response to augmentation. Saphenofemoral Junction: No evidence of thrombus. Normal compressibility and flow on color Doppler imaging. Profunda Femoral Vein: No evidence of thrombus. Normal compressibility and flow on color Doppler imaging. Femoral Vein: No evidence of thrombus. Normal compressibility, respiratory phasicity and response to augmentation. Popliteal Vein:  No evidence of thrombus. Normal compressibility, respiratory phasicity and response to augmentation. Calf Veins: Posterior tibial vein appears patent and compressible. Peroneal vein again demonstrates partial compressibility with eccentric hypoechoic thrombus compatible with nonocclusive DVT. No interval change. Other Findings: Peripheral extremity edema noted in the calf and ankle region. IMPRESSION: 1. Stable nonocclusive left calf DVT in the peroneal vein. Very low thrombus burden. No propagation into the popliteal or femoral veins. 2. No other significant left lower extremity DVT by ultrasound. 3. Left calf and ankle subcutaneous edema. Electronically Signed   By: Judie Petit.  Shick M.D.   On: 10/05/2022 14:57    Procedures Procedures    Medications Ordered in ED Medications - No data to display  ED Course/ Medical Decision Making/ A&P                             Medical Decision Making Patient seen here on 09/28/2022 diagnosed with DVT left lower extremity she was started on Eliquis.  She denies any  missed doses.  Sent here under advisement of her PCP for increased swelling of the left lower extremity.  She denies any numbness of the extremity, witness of her foot, chest pain or shortness of breath.  No recent travel  Differential would include but not limited to worsening of recently diagnosed DVT, cellulitis, CHF, musculoskeletal injury, osteomyelitis, superficial thrombophlebitis  Amount and/or Complexity of Data Reviewed External Data Reviewed: radiology.    Details: Review of ultrasound from 09/28/2022 showsI discussed these images personally with Dr. Mayford Knife. Both of the paired peroneal veins demonstrate nonocclusive thrombus. This does not extend into the popliteal vein itself and this study is, therefore, positive for infrapopliteal DVT only.  Radiology: ordered.    Details: Repeat venous ultrasound today of the left lower extremity shows stable nonocclusive left DVT of the peroneal vein.  No propagation into the popliteal or femoral veins          Final Clinical Impression(s) / ED Diagnoses Final diagnoses:  Leg swelling  History of DVT of lower extremity    Rx / DC Orders ED Discharge Orders     None         Rosey Bath 10/05/22 1530    Gloris Manchester, MD 10/06/22 [phone]

## 2022-10-05 NOTE — Discharge Instructions (Signed)
We have repeated your ultrasound today.  There is no worsening of your previously diagnosed DVT.  As discussed, continue to take your apixaban as directed and elevate your left leg when possible.  Please follow-up with your primary care provider for recheck or return to the emergency department for any new or worsening symptoms.

## 2022-10-05 NOTE — ED Triage Notes (Signed)
Pt dc with LLE DVT and swelling about 5 days ago and started on Eliquis.pcp advised pt to come ED today due to swelling not any better. Pt has tight pitting edema noted from ankle to upper thigh. A/o denies sob.

## 2022-10-15 DIAGNOSIS — Z79891 Long term (current) use of opiate analgesic: Secondary | ICD-10-CM | POA: Diagnosis not present

## 2022-10-15 DIAGNOSIS — K5903 Drug induced constipation: Secondary | ICD-10-CM | POA: Diagnosis not present

## 2022-10-15 DIAGNOSIS — M961 Postlaminectomy syndrome, not elsewhere classified: Secondary | ICD-10-CM | POA: Diagnosis not present

## 2022-10-15 DIAGNOSIS — G894 Chronic pain syndrome: Secondary | ICD-10-CM | POA: Diagnosis not present

## 2022-10-15 DIAGNOSIS — M1612 Unilateral primary osteoarthritis, left hip: Secondary | ICD-10-CM | POA: Diagnosis not present

## 2022-10-20 DIAGNOSIS — L03115 Cellulitis of right lower limb: Secondary | ICD-10-CM | POA: Diagnosis not present

## 2022-10-20 DIAGNOSIS — L03116 Cellulitis of left lower limb: Secondary | ICD-10-CM | POA: Diagnosis not present

## 2022-10-20 DIAGNOSIS — I82562 Chronic embolism and thrombosis of left calf muscular vein: Secondary | ICD-10-CM | POA: Diagnosis not present

## 2022-10-21 ENCOUNTER — Ambulatory Visit (INDEPENDENT_AMBULATORY_CARE_PROVIDER_SITE_OTHER): Payer: 59

## 2022-10-21 DIAGNOSIS — I441 Atrioventricular block, second degree: Secondary | ICD-10-CM

## 2022-10-21 LAB — CUP PACEART REMOTE DEVICE CHECK
Battery Remaining Longevity: 83 mo
Battery Remaining Percentage: 83 %
Battery Voltage: 2.99 V
Brady Statistic AP VP Percent: 23 %
Brady Statistic AP VS Percent: 1 %
Brady Statistic AS VP Percent: 57 %
Brady Statistic AS VS Percent: 19 %
Brady Statistic RA Percent Paced: 23 %
Brady Statistic RV Percent Paced: 80 %
Date Time Interrogation Session: 20240508035212
Implantable Lead Connection Status: 753985
Implantable Lead Connection Status: 753985
Implantable Lead Implant Date: 20221109
Implantable Lead Implant Date: 20221109
Implantable Lead Location: 753859
Implantable Lead Location: 753860
Implantable Pulse Generator Implant Date: 20221109
Lead Channel Impedance Value: 390 Ohm
Lead Channel Impedance Value: 390 Ohm
Lead Channel Pacing Threshold Amplitude: 0.75 V
Lead Channel Pacing Threshold Amplitude: 1 V
Lead Channel Pacing Threshold Pulse Width: 0.5 ms
Lead Channel Pacing Threshold Pulse Width: 0.5 ms
Lead Channel Sensing Intrinsic Amplitude: 3.7 mV
Lead Channel Sensing Intrinsic Amplitude: 9.2 mV
Lead Channel Setting Pacing Amplitude: 2 V
Lead Channel Setting Pacing Amplitude: 2.5 V
Lead Channel Setting Pacing Pulse Width: 0.5 ms
Lead Channel Setting Sensing Sensitivity: 2 mV
Pulse Gen Model: 2272
Pulse Gen Serial Number: 3956509

## 2022-10-29 DIAGNOSIS — Z681 Body mass index (BMI) 19 or less, adult: Secondary | ICD-10-CM | POA: Diagnosis not present

## 2022-10-29 DIAGNOSIS — R300 Dysuria: Secondary | ICD-10-CM | POA: Diagnosis not present

## 2022-10-29 DIAGNOSIS — R030 Elevated blood-pressure reading, without diagnosis of hypertension: Secondary | ICD-10-CM | POA: Diagnosis not present

## 2022-11-06 ENCOUNTER — Ambulatory Visit (INDEPENDENT_AMBULATORY_CARE_PROVIDER_SITE_OTHER): Payer: Medicare HMO

## 2022-11-06 DIAGNOSIS — R339 Retention of urine, unspecified: Secondary | ICD-10-CM | POA: Diagnosis not present

## 2022-11-06 MED ORDER — AMBULATORY NON FORMULARY MEDICATION: 11 refills | Status: AC

## 2022-11-06 NOTE — Progress Notes (Addendum)
Cath Change/ Replacement  Patient is present today for a catheter change due to urinary retention.  3 ml of water was removed from the balloon, a 16 FR foley cath was removed without difficulty.  Patient was cleaned and prepped in a sterile fashion with betadine. A 16  FR foley cath was replaced into the bladder, no complications were noted. Urine return was noted 10 ml and urine was yellow in color. The balloon was filled with 10ml of sterile water. A leg bag was attached for drainage.  A night bag was also given to the patient and patient was given instruction on how to change from one bag to another. Patient was given proper instruction on catheter care.    Performed by: Guss Bunde, CMA/ Autry.Rack, CMA  Follow up: No follow-ups on file.

## 2022-11-11 NOTE — Progress Notes (Signed)
Remote pacemaker transmission.   

## 2022-11-12 DIAGNOSIS — R030 Elevated blood-pressure reading, without diagnosis of hypertension: Secondary | ICD-10-CM | POA: Diagnosis not present

## 2022-11-12 DIAGNOSIS — N3001 Acute cystitis with hematuria: Secondary | ICD-10-CM | POA: Diagnosis not present

## 2022-11-12 DIAGNOSIS — Z681 Body mass index (BMI) 19 or less, adult: Secondary | ICD-10-CM | POA: Diagnosis not present

## 2022-11-19 DIAGNOSIS — M961 Postlaminectomy syndrome, not elsewhere classified: Secondary | ICD-10-CM | POA: Diagnosis not present

## 2022-11-19 DIAGNOSIS — G894 Chronic pain syndrome: Secondary | ICD-10-CM | POA: Diagnosis not present

## 2022-11-19 DIAGNOSIS — K5903 Drug induced constipation: Secondary | ICD-10-CM | POA: Diagnosis not present

## 2022-11-19 DIAGNOSIS — M1612 Unilateral primary osteoarthritis, left hip: Secondary | ICD-10-CM | POA: Diagnosis not present

## 2022-11-19 DIAGNOSIS — Z79891 Long term (current) use of opiate analgesic: Secondary | ICD-10-CM | POA: Diagnosis not present

## 2022-12-08 ENCOUNTER — Ambulatory Visit: Payer: Medicare HMO

## 2022-12-16 DIAGNOSIS — M961 Postlaminectomy syndrome, not elsewhere classified: Secondary | ICD-10-CM | POA: Diagnosis not present

## 2022-12-16 DIAGNOSIS — G894 Chronic pain syndrome: Secondary | ICD-10-CM | POA: Diagnosis not present

## 2022-12-16 DIAGNOSIS — Z79891 Long term (current) use of opiate analgesic: Secondary | ICD-10-CM | POA: Diagnosis not present

## 2022-12-16 DIAGNOSIS — K5903 Drug induced constipation: Secondary | ICD-10-CM | POA: Diagnosis not present

## 2022-12-16 DIAGNOSIS — M1612 Unilateral primary osteoarthritis, left hip: Secondary | ICD-10-CM | POA: Diagnosis not present

## 2023-01-13 DIAGNOSIS — M1612 Unilateral primary osteoarthritis, left hip: Secondary | ICD-10-CM | POA: Diagnosis not present

## 2023-01-13 DIAGNOSIS — Z79891 Long term (current) use of opiate analgesic: Secondary | ICD-10-CM | POA: Diagnosis not present

## 2023-01-13 DIAGNOSIS — G894 Chronic pain syndrome: Secondary | ICD-10-CM | POA: Diagnosis not present

## 2023-01-13 DIAGNOSIS — M961 Postlaminectomy syndrome, not elsewhere classified: Secondary | ICD-10-CM | POA: Diagnosis not present

## 2023-01-13 DIAGNOSIS — K5903 Drug induced constipation: Secondary | ICD-10-CM | POA: Diagnosis not present

## 2023-01-19 DIAGNOSIS — N3001 Acute cystitis with hematuria: Secondary | ICD-10-CM | POA: Diagnosis not present

## 2023-01-19 DIAGNOSIS — Z681 Body mass index (BMI) 19 or less, adult: Secondary | ICD-10-CM | POA: Diagnosis not present

## 2023-01-19 DIAGNOSIS — R300 Dysuria: Secondary | ICD-10-CM | POA: Diagnosis not present

## 2023-01-19 DIAGNOSIS — R030 Elevated blood-pressure reading, without diagnosis of hypertension: Secondary | ICD-10-CM | POA: Diagnosis not present

## 2023-01-20 ENCOUNTER — Ambulatory Visit (INDEPENDENT_AMBULATORY_CARE_PROVIDER_SITE_OTHER): Payer: 59

## 2023-01-20 DIAGNOSIS — I441 Atrioventricular block, second degree: Secondary | ICD-10-CM | POA: Diagnosis not present

## 2023-02-05 DIAGNOSIS — Z978 Presence of other specified devices: Secondary | ICD-10-CM | POA: Diagnosis not present

## 2023-02-05 DIAGNOSIS — R030 Elevated blood-pressure reading, without diagnosis of hypertension: Secondary | ICD-10-CM | POA: Diagnosis not present

## 2023-02-05 DIAGNOSIS — R300 Dysuria: Secondary | ICD-10-CM | POA: Diagnosis not present

## 2023-02-05 DIAGNOSIS — Z681 Body mass index (BMI) 19 or less, adult: Secondary | ICD-10-CM | POA: Diagnosis not present

## 2023-02-05 DIAGNOSIS — N3090 Cystitis, unspecified without hematuria: Secondary | ICD-10-CM | POA: Diagnosis not present

## 2023-02-05 NOTE — Progress Notes (Signed)
Remote pacemaker transmission.   

## 2023-02-08 ENCOUNTER — Telehealth: Payer: Self-pay

## 2023-02-08 NOTE — Telephone Encounter (Signed)
Patient called requesting a ua/uc, she is having dysuria and burning.  Patient will drop off a urine tomorrow at 2 pm and we will call once NP reviews results.

## 2023-02-08 NOTE — Telephone Encounter (Signed)
Patient is having UTI symptoms. Can she drop off a urine specimen?  Please advise.

## 2023-02-09 ENCOUNTER — Ambulatory Visit (INDEPENDENT_AMBULATORY_CARE_PROVIDER_SITE_OTHER): Payer: Medicare HMO

## 2023-02-09 DIAGNOSIS — Z79891 Long term (current) use of opiate analgesic: Secondary | ICD-10-CM | POA: Diagnosis not present

## 2023-02-09 DIAGNOSIS — K5903 Drug induced constipation: Secondary | ICD-10-CM | POA: Diagnosis not present

## 2023-02-09 DIAGNOSIS — R399 Unspecified symptoms and signs involving the genitourinary system: Secondary | ICD-10-CM

## 2023-02-09 DIAGNOSIS — G894 Chronic pain syndrome: Secondary | ICD-10-CM | POA: Diagnosis not present

## 2023-02-09 DIAGNOSIS — M961 Postlaminectomy syndrome, not elsewhere classified: Secondary | ICD-10-CM | POA: Diagnosis not present

## 2023-02-09 DIAGNOSIS — M1612 Unilateral primary osteoarthritis, left hip: Secondary | ICD-10-CM | POA: Diagnosis not present

## 2023-02-09 NOTE — Telephone Encounter (Signed)
Please other telephone encounter

## 2023-02-10 NOTE — Progress Notes (Signed)
Patient presents with symptoms of burning.  Upon arrival she stated she had a catheter and per patient it was just changed.  She could not remember the date of her last catheter change but states that she has it changed monthly at our office.  Her last noted nurse visit was on 11/06/22.  I informed her that she needed to have her catheter changed as soon as possible.  She was unable to stay for this apt due to a conflicting apt in [place].  She is scheduled for a catheter change on 02/11/2023.  I informed her that burning is normal with having a catheter and to increase fluids at this time.  Patient voiced understanding and states she will call our office if she is unable to keep apt.  I informed her of the importance of having the catheter changed monthly.  Patient will reschedule apt for next week if she is unable to keep 08/29 apt.

## 2023-02-11 ENCOUNTER — Ambulatory Visit (INDEPENDENT_AMBULATORY_CARE_PROVIDER_SITE_OTHER): Payer: Medicare HMO

## 2023-02-11 DIAGNOSIS — R339 Retention of urine, unspecified: Secondary | ICD-10-CM | POA: Diagnosis not present

## 2023-02-11 MED ORDER — CIPROFLOXACIN HCL 500 MG PO TABS
500.0000 mg | ORAL_TABLET | Freq: Once | ORAL | Status: DC
Start: 2023-02-11 — End: 2023-02-11

## 2023-02-11 NOTE — Patient Instructions (Signed)

## 2023-02-11 NOTE — Progress Notes (Addendum)
Cath Change/ Replacement  Patient is present today for a catheter change due to urinary retention.  10ml of water was removed from the balloon, a 16FR foley cath was removed without difficulty.  Patient was cleaned and prepped in a sterile fashion with betadine and 2% lidocaine jelly was instilled into the urethra. A 16 FR foley cath was replaced into the bladder, no complications were noted. Urine return was noted 30ml and urine was yellow in color. The balloon was filled with 10ml of sterile water. A leg bag was attached for drainage.  Patient was given proper instruction on catheter care.    Performed by: Caprice Wasko LPN  Follow up: keep scheduled [place]

## 2023-02-12 NOTE — Addendum Note (Signed)
Addended by: Ferdinand Lango on: 02/12/2023 12:53 PM   Modules accepted: Level of Service

## 2023-03-10 ENCOUNTER — Ambulatory Visit: Payer: Medicare HMO

## 2023-03-10 DIAGNOSIS — M1612 Unilateral primary osteoarthritis, left hip: Secondary | ICD-10-CM | POA: Diagnosis not present

## 2023-03-10 DIAGNOSIS — Z79891 Long term (current) use of opiate analgesic: Secondary | ICD-10-CM | POA: Diagnosis not present

## 2023-03-10 DIAGNOSIS — G894 Chronic pain syndrome: Secondary | ICD-10-CM | POA: Diagnosis not present

## 2023-03-10 DIAGNOSIS — M961 Postlaminectomy syndrome, not elsewhere classified: Secondary | ICD-10-CM | POA: Diagnosis not present

## 2023-03-10 DIAGNOSIS — K5903 Drug induced constipation: Secondary | ICD-10-CM | POA: Diagnosis not present

## 2023-03-15 DIAGNOSIS — I2510 Atherosclerotic heart disease of native coronary artery without angina pectoris: Secondary | ICD-10-CM | POA: Diagnosis not present

## 2023-03-15 DIAGNOSIS — I10 Essential (primary) hypertension: Secondary | ICD-10-CM | POA: Diagnosis not present

## 2023-03-15 DIAGNOSIS — M545 Low back pain, unspecified: Secondary | ICD-10-CM | POA: Diagnosis not present

## 2023-03-15 DIAGNOSIS — K5900 Constipation, unspecified: Secondary | ICD-10-CM | POA: Diagnosis not present

## 2023-03-16 ENCOUNTER — Telehealth: Payer: Self-pay

## 2023-03-16 NOTE — Telephone Encounter (Signed)
Patient needing to make her nurse visit later in the day on Oct. 8.  There are no available appointments later that day.  The next available will be Oct 28th.   Patient wants to know if she can wait that long for a catheter change.  Please advise.

## 2023-03-16 NOTE — Telephone Encounter (Signed)
No she needs to keep her appt as scheduled.   Her last catheter change was in August.

## 2023-03-23 ENCOUNTER — Ambulatory Visit (INDEPENDENT_AMBULATORY_CARE_PROVIDER_SITE_OTHER): Payer: Medicare HMO

## 2023-03-23 DIAGNOSIS — R339 Retention of urine, unspecified: Secondary | ICD-10-CM | POA: Diagnosis not present

## 2023-03-23 MED ORDER — CIPROFLOXACIN HCL 500 MG PO TABS
500.0000 mg | ORAL_TABLET | Freq: Once | ORAL | Status: AC
Start: 2023-03-23 — End: 2023-03-23
  Administered 2023-03-23: 500 mg via ORAL

## 2023-03-23 NOTE — Progress Notes (Signed)
Cath Change/ Replacement  Patient is present today for a catheter change due to urinary retention.  10ml of water was removed from the balloon, a 16FR foley cath was removed without difficulty.  Patient was cleaned and prepped in a sterile fashion with betadine and 2% lidocaine jelly was instilled into the urethra. A 16 FR foley cath was replaced into the bladder, no complications were noted. Urine return was noted 5ml and urine was yellow in color. The balloon was filled with 10ml of sterile water. A leg bag was attached for drainage. Patient was given proper instruction on catheter care.    Performed by: Guss Bunde, CMA  Follow up: as scheduled.

## 2023-04-07 DIAGNOSIS — M961 Postlaminectomy syndrome, not elsewhere classified: Secondary | ICD-10-CM | POA: Diagnosis not present

## 2023-04-07 DIAGNOSIS — M1612 Unilateral primary osteoarthritis, left hip: Secondary | ICD-10-CM | POA: Diagnosis not present

## 2023-04-07 DIAGNOSIS — G894 Chronic pain syndrome: Secondary | ICD-10-CM | POA: Diagnosis not present

## 2023-04-07 DIAGNOSIS — K5903 Drug induced constipation: Secondary | ICD-10-CM | POA: Diagnosis not present

## 2023-04-07 DIAGNOSIS — Z79891 Long term (current) use of opiate analgesic: Secondary | ICD-10-CM | POA: Diagnosis not present

## 2023-04-21 ENCOUNTER — Ambulatory Visit (INDEPENDENT_AMBULATORY_CARE_PROVIDER_SITE_OTHER): Payer: 59

## 2023-04-21 DIAGNOSIS — I441 Atrioventricular block, second degree: Secondary | ICD-10-CM

## 2023-04-21 LAB — CUP PACEART REMOTE DEVICE CHECK
Battery Remaining Longevity: 76 mo
Battery Remaining Percentage: 78 %
Battery Voltage: 2.99 V
Brady Statistic AP VP Percent: 34 %
Brady Statistic AP VS Percent: 1 %
Brady Statistic AS VP Percent: 51 %
Brady Statistic AS VS Percent: 14 %
Brady Statistic RA Percent Paced: 34 %
Brady Statistic RV Percent Paced: 85 %
Date Time Interrogation Session: 20241106021430
Implantable Lead Connection Status: 753985
Implantable Lead Connection Status: 753985
Implantable Lead Implant Date: 20221109
Implantable Lead Implant Date: 20221109
Implantable Lead Location: 753859
Implantable Lead Location: 753860
Implantable Pulse Generator Implant Date: 20221109
Lead Channel Impedance Value: 380 Ohm
Lead Channel Impedance Value: 400 Ohm
Lead Channel Pacing Threshold Amplitude: 0.75 V
Lead Channel Pacing Threshold Amplitude: 1 V
Lead Channel Pacing Threshold Pulse Width: 0.5 ms
Lead Channel Pacing Threshold Pulse Width: 0.5 ms
Lead Channel Sensing Intrinsic Amplitude: 3.3 mV
Lead Channel Sensing Intrinsic Amplitude: 9.1 mV
Lead Channel Setting Pacing Amplitude: 2 V
Lead Channel Setting Pacing Amplitude: 2.5 V
Lead Channel Setting Pacing Pulse Width: 0.5 ms
Lead Channel Setting Sensing Sensitivity: 2 mV
Pulse Gen Model: 2272
Pulse Gen Serial Number: 3956509

## 2023-04-26 ENCOUNTER — Ambulatory Visit (INDEPENDENT_AMBULATORY_CARE_PROVIDER_SITE_OTHER): Payer: Medicare HMO

## 2023-04-26 DIAGNOSIS — J449 Chronic obstructive pulmonary disease, unspecified: Secondary | ICD-10-CM | POA: Diagnosis not present

## 2023-04-26 DIAGNOSIS — N390 Urinary tract infection, site not specified: Secondary | ICD-10-CM | POA: Diagnosis not present

## 2023-04-26 DIAGNOSIS — R339 Retention of urine, unspecified: Secondary | ICD-10-CM | POA: Diagnosis not present

## 2023-04-26 DIAGNOSIS — E7800 Pure hypercholesterolemia, unspecified: Secondary | ICD-10-CM | POA: Diagnosis not present

## 2023-04-26 DIAGNOSIS — Z466 Encounter for fitting and adjustment of urinary device: Secondary | ICD-10-CM | POA: Diagnosis not present

## 2023-04-26 DIAGNOSIS — I10 Essential (primary) hypertension: Secondary | ICD-10-CM | POA: Diagnosis not present

## 2023-04-26 DIAGNOSIS — Z87891 Personal history of nicotine dependence: Secondary | ICD-10-CM | POA: Diagnosis not present

## 2023-04-26 DIAGNOSIS — I2510 Atherosclerotic heart disease of native coronary artery without angina pectoris: Secondary | ICD-10-CM | POA: Diagnosis not present

## 2023-04-26 NOTE — Progress Notes (Signed)
Cath Change/ Replacement  Patient is present today for a catheter change due to urinary retention.  10ml of water was removed from the balloon, a 16FR foley cath was removed without difficulty.  Patient was cleaned and prepped in a sterile fashion with betadine and 2% lidocaine jelly was instilled into the urethra. A 16 FR foley cath was replaced into the bladder, no complications were noted. Catheter was flushed with 10cc of sterile water with return. The balloon was filled with 10ml of sterile water. A leg bag was attached for drainage.  A night bag was also given to the patient and patient was given instruction on how to change from one bag to another. Patient was given proper instruction on catheter care.    Performed by: Guss Bunde, CMA  Follow up: No follow-ups on file.

## 2023-05-05 DIAGNOSIS — M1612 Unilateral primary osteoarthritis, left hip: Secondary | ICD-10-CM | POA: Diagnosis not present

## 2023-05-05 DIAGNOSIS — Z79891 Long term (current) use of opiate analgesic: Secondary | ICD-10-CM | POA: Diagnosis not present

## 2023-05-05 DIAGNOSIS — K5903 Drug induced constipation: Secondary | ICD-10-CM | POA: Diagnosis not present

## 2023-05-05 DIAGNOSIS — M961 Postlaminectomy syndrome, not elsewhere classified: Secondary | ICD-10-CM | POA: Diagnosis not present

## 2023-05-05 DIAGNOSIS — G894 Chronic pain syndrome: Secondary | ICD-10-CM | POA: Diagnosis not present

## 2023-05-11 NOTE — Progress Notes (Signed)
Remote pacemaker transmission.   

## 2023-05-30 DIAGNOSIS — Z87891 Personal history of nicotine dependence: Secondary | ICD-10-CM | POA: Diagnosis not present

## 2023-05-30 DIAGNOSIS — I10 Essential (primary) hypertension: Secondary | ICD-10-CM | POA: Diagnosis not present

## 2023-05-30 DIAGNOSIS — Z7901 Long term (current) use of anticoagulants: Secondary | ICD-10-CM | POA: Diagnosis not present

## 2023-05-30 DIAGNOSIS — Z79810 Long term (current) use of selective estrogen receptor modulators (SERMs): Secondary | ICD-10-CM | POA: Diagnosis not present

## 2023-05-30 DIAGNOSIS — Z881 Allergy status to other antibiotic agents status: Secondary | ICD-10-CM | POA: Diagnosis not present

## 2023-05-30 DIAGNOSIS — J449 Chronic obstructive pulmonary disease, unspecified: Secondary | ICD-10-CM | POA: Diagnosis not present

## 2023-05-30 DIAGNOSIS — I2510 Atherosclerotic heart disease of native coronary artery without angina pectoris: Secondary | ICD-10-CM | POA: Diagnosis not present

## 2023-05-30 DIAGNOSIS — E7800 Pure hypercholesterolemia, unspecified: Secondary | ICD-10-CM | POA: Diagnosis not present

## 2023-05-30 DIAGNOSIS — K5900 Constipation, unspecified: Secondary | ICD-10-CM | POA: Diagnosis not present

## 2023-06-03 ENCOUNTER — Encounter: Payer: Self-pay | Admitting: Urology

## 2023-06-03 ENCOUNTER — Ambulatory Visit: Payer: Medicare HMO | Admitting: Urology

## 2023-06-03 VITALS — BP 166/73 | HR 63

## 2023-06-03 DIAGNOSIS — Z87448 Personal history of other diseases of urinary system: Secondary | ICD-10-CM

## 2023-06-03 DIAGNOSIS — N1370 Vesicoureteral-reflux, unspecified: Secondary | ICD-10-CM

## 2023-06-03 DIAGNOSIS — R339 Retention of urine, unspecified: Secondary | ICD-10-CM | POA: Diagnosis not present

## 2023-06-03 DIAGNOSIS — N3644 Muscular disorders of urethra: Secondary | ICD-10-CM

## 2023-06-03 DIAGNOSIS — Z87440 Personal history of urinary (tract) infections: Secondary | ICD-10-CM

## 2023-06-03 NOTE — Progress Notes (Signed)
Cath Change/ Replacement  Patient is present today for a catheter change due to urinary retention.  10ml of water was removed from the balloon, a 16FR foley cath was removed without difficulty.  Patient was cleaned and prepped in a sterile fashion with betadine and 2% lidocaine jelly was instilled into the urethra. A 16 FR foley cath was replaced into the bladder, no complications were noted. Urine return was noted 20ml and urine was yellow in color. The balloon was filled with 10ml of sterile water. A leg bag was attached for drainage.  A night bag was also given to the patient and patient was given instruction on how to change from one bag to another. Patient was given proper instruction on catheter care.    Performed by: Gwendolyn Grant CMA  Follow up: As scheduled

## 2023-06-03 NOTE — Progress Notes (Signed)
Subjective:  1. Urinary retention   2. Detrusor sphincter dyssynergia   3. Vesico-ureteral reflux   4. 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  She had to go to the ER on Monday because it wasn't draining and she was leaking around it.  She had the foley repositioned and is doing better.  She is do for a change today.  She has constipation which impacts the catheter. She has had no hematuria and only 1 UTI in the past 6 months.  She has been managed with the foley since 09/13/19.  She has a history of DSD with retention and bilateral VUR.   She remains on tamsulosin.   I reviewed my records from [facility] to update her history.     ROS:  ROS:  A complete review of systems was performed.  All systems are negative except for pertinent findings as noted.   ROS  Allergies  Allergen Reactions   Tetracycline Nausea Only    Outpatient Encounter Medications as of 06/03/2023  Medication Sig Note   AMBULATORY NON FORMULARY MEDICATION Medication Name: Catheter Leg Bags    APIXABAN (ELIQUIS) VTE STARTER PACK (10MG  AND 5MG ) Take as directed on package: start with two-5mg  tablets twice daily for 7 days. On day 8, switch to one-5mg  tablet twice daily.    aspirin EC 81 MG EC tablet Take 1 tablet (81 mg total) by mouth daily. Swallow whole.    atorvastatin (LIPITOR) 40 MG tablet Take 1 tablet (40 mg total) by mouth daily.    Calcium Carbonate (CALCIUM 600 PO) Take 1,200 mg by mouth daily.    Cholecalciferol (VITAMIN D3) 1000 UNITS CAPS Take 1,000 Units by mouth daily. Take one tab daily    clopidogrel (PLAVIX) 75 MG tablet Take 1 tablet (75 mg total) by mouth daily.    conjugated estrogens (PREMARIN) vaginal cream Place 1 Applicatorful vaginally daily.    diphenhydrAMINE (BENADRYL) 25 mg capsule Take 25 mg by mouth daily as needed for sleep.     FERREX 150 150 MG capsule Take 150 mg by mouth daily. 10/06/2021: LF:07/30/2021 150 MG CAPS (disp 30, 30d supply)    HYDROcodone-acetaminophen (NORCO) 10-325 MG tablet Take 1 tablet by mouth every 4 (four) hours as needed for severe pain. 10/06/2021: LF:09/08/2021 10-325 MG TABS (disp 150, 30d supply)    leptospermum manuka honey (MEDIHONEY) PSTE paste Apply 1 application. topically daily.    levothyroxine (SYNTHROID) 25 MCG tablet TAKE 1 TABLET BY MOUTH DAILY AT 6 AM. (Patient taking differently: Take 25 mcg by mouth daily before breakfast.) 10/06/2021: LF:08/06/2021 25 MCG TABS (disp 30, 30d supply)    Melatonin 10 MG TABS Take 1 tablet by mouth at bedtime.    metoprolol succinate (TOPROL-XL) 25 MG 24 hr tablet Take 25 mg by mouth daily.    MOVANTIK 25 MG TABS tablet Take 25 mg by mouth daily. 10/06/2021: LF:08/25/2021 25 MG TABS (disp 30, 30d supply) pt unsure of this med   Multiple Vitamin (MULTIVITAMIN) tablet Take 1 tablet by mouth daily.    nitroGLYCERIN (NITROSTAT) 0.4 MG SL tablet Place under the tongue.    polyethylene glycol (MIRALAX / GLYCOLAX) 17 g packet Take 17 g by mouth every morning.    raloxifene (EVISTA) 60 MG tablet Take 60 mg by mouth daily. 10/06/2021: LF:09/22/2021 60 MG TABS (disp 30, 30d supply) pt  unsure of this med   tamsulosin (FLOMAX) 0.4 MG CAPS capsule Take 0.4 mg by mouth daily. 10/06/2021: LF:07/18/2021 0.4 MG CAPS (disp 90, 90d supply)    temazepam (RESTORIL) 30 MG capsule Take 30 mg by mouth at bedtime. 10/06/2021: LF:09/22/2021 30 MG CAPS (disp 30, 30d supply)    tretinoin (RETIN-A) 0.05 % cream Apply 1 application topically at bedtime.    benazepril (LOTENSIN) 20 MG tablet Take 1 tablet (20 mg total) by mouth daily. 10/06/2021: LF:09/22/2021 20 MG TABS (disp 30, 30d supply) pt unsure if she is taking   No facility-administered encounter medications on file as of 06/03/2023.    Past Medical History:  Diagnosis Date   Aortic valve sclerosis    Mild, echo, April, 2012    ARDS (adult respiratory distress syndrome) (HCC) 2003   with tracheostomy-peritonitis   Arthritis    Back pain    Need for surgery, Dr. Trey Sailors   CAD (coronary artery disease)    Stent to LAD, 2003 [facility] / nuclear scan April, 201 to artifact / persistent shortness of breath / catheterization Oct 21, 2010... widely patent stent to the mid LAD, no significant obstructive disease, should be low risk    for back surgery   Carotid arterial disease (HCC)    Moderate-Dr. Myra Gianotti following   Chronic back pain    COPD (chronic obstructive pulmonary disease) (HCC)    Diastolic dysfunction    Echo, April, 2011   EF 65%   Dysrhythmia    afib   Ejection fraction    EF 65%, echo, September 15, 2010   GERD (gastroesophageal reflux disease)    Hemolytic anemia (HCC)    Followed by Dr. Derald Macleod   Hypertension    Hypothyroidism    Left ventricular hypertrophy    EF 65%, echo, April, 2011   MR (mitral regurgitation)     Mild echo, 2012 ///  Mild per Echo 2009,  mild, echo, April, 2011   Myocardial infarction [facility])    Palpitations    Holter, September 16, 2010, PACs and PVCs,one 4 beat run of SVT   Pneumonia 11-2011   Preoperative clearance    Preop clearance for back surgery   Shortness of breath    March, 2012   UTI (urinary tract infection)    Chronic-on Macrodantin    Past Surgical History:  Procedure Laterality Date   APPENDECTOMY     BACK SURGERY     BREAST LUMPECTOMY Left    CATARACT EXTRACTION W/PHACO Right 12/05/2012   Procedure: CATARACT EXTRACTION PHACO AND INTRAOCULAR LENS PLACEMENT (IOC);  Surgeon: Gemma Payor, MD;  Location: [facility];  Service: Ophthalmology;  Laterality: Right;  CDE:15.30   CATARACT EXTRACTION W/PHACO Left 12/15/2012   Procedure: CATARACT EXTRACTION PHACO AND INTRAOCULAR LENS PLACEMENT (IOC);  Surgeon: Gemma Payor, MD;  Location: [facility];  Service: Ophthalmology;  Laterality: Left;  CDE:22.56   CORONARY ANGIOPLASTY WITH STENT PLACEMENT  2003   INTRAMEDULLARY (IM) NAIL  INTERTROCHANTERIC Left 09/27/2020   Procedure: INTRAMEDULLARY (IM) NAIL INTERTROCHANTRIC;  Surgeon: Cammy Copa, MD;  Location: [facility] OR;  Service: Orthopedics;  Laterality: Left;  left trochanter   PACEMAKER IMPLANT N/A 04/23/2021   Procedure: PACEMAKER IMPLANT;  Surgeon: Regan Lemming, MD;  Location: [facility] INVASIVE CV LAB;  Service: Cardiovascular;  Laterality: N/A;   repair of ruptured lumbar discs  12-2011   SPINE SURGERY     lumbar disk removal by Dr. Laurian Brim at [facility]   THYROID SURGERY  08/2009  Benign adenomas   TONSILLECTOMY     TRACHEOSTOMY  2003   Following ARDS     Social History   Socioeconomic History   Marital status: Married    Spouse name: Not on file   Number of children: 1   Years of education: Not on file   Highest education level: Not on file  Occupational History   Occupation: Retired  Tobacco Use   Smoking status: Former    Current packs/day: 0.00    Average packs/day: 1 pack/day for 20.0 years (20.0 ttl pk-yrs)    Types: Cigarettes    Start date: 06/15/1980    Quit date: 06/15/2000    Years since quitting: 22.9   Smokeless tobacco: Never  Vaping Use   Vaping status: Never Used  Substance and Sexual Activity   Alcohol use: No   Drug use: No   Sexual activity: Not on file  Other Topics Concern   Not on file  Social History Narrative   Regular Exercise: Yes-Walk   Social Drivers of Health   Financial Resource Strain: Low Risk  (10/20/2022)   Received from [facility], [facility]   Overall Financial Resource Strain (CARDIA)    Difficulty of Paying Living Expenses: Not hard at all  Food Insecurity: No Food Insecurity (10/20/2022)   Received from [facility], [facility]   Hunger Vital Sign    Worried About Running Out of Food in the Last Year: Never true    Ran Out of Food in the Last Year: Never true  Transportation Needs: No Transportation Needs (10/20/2022)   Received from [facility], [facility]   PRAPARE  - Transportation    Lack of Transportation (Medical): No    Lack of Transportation (Non-Medical): No  Physical Activity: Inactive (10/20/2022)   Received from [facility], [facility]   Exercise Vital Sign    Days of Exercise per Week: 0 days    Minutes of Exercise per Session: 0 min  Stress: No Stress Concern Present (10/20/2022)   Received from [facility], [facility] - Occupational Stress Questionnaire    Feeling of Stress : Not at all  Social Connections: Moderately Integrated (10/20/2022)   Received from [facility], [facility]   Social Connection and Isolation Panel [NHANES]    Frequency of Communication with Friends and Family: Once a week    Frequency of Social Gatherings with Friends and Family: Twice a week    Attends Religious Services: More than 4 times per year    Active Member of [employer] or Organizations: No    Attends Banker Meetings: Never    Marital Status: Married  Catering manager Violence: Not At Risk (10/20/2022)   Received from [facility], [facility]   Humiliation, Afraid, Rape, and Kick questionnaire    Fear of Current or Ex-Partner: No    Emotionally Abused: No    Physically Abused: No    Sexually Abused: No    Family History  Problem Relation Age of Onset   Heart failure Father    Diabetes Son        Objective: Vitals:   06/03/23 1502  BP: (!) 166/73  Pulse: 63     Physical Exam  Lab Results:  PSA No results found for: "PSA" No results found for: "TESTOSTERONE"    Studies/Results: No results found. No results found for this or  any previous visit.  Results for orders placed during the hospital encounter of 04/19/21  US Venous Img Lower Bilateral (DVT)  Narrative CLINICAL DATA:  Right calf swelling. Left leg pain. Left hip fracture in April 2022.  EXAM: BILATERAL LOWER EXTREMITY VENOUS DOPPLER ULTRASOUND  TECHNIQUE: Gray-scale sonography  with compression, as well as color and duplex ultrasound, were performed to evaluate the deep venous system(s) from the level of the common femoral vein through the popliteal and proximal calf veins.  COMPARISON:  None.  FINDINGS: VENOUS  Normal compressibility of the common femoral, superficial femoral, and popliteal veins, as well as the visualized calf veins. Visualized portions of profunda femoral vein and great saphenous vein unremarkable. No filling defects to suggest DVT on grayscale or color Doppler imaging. Doppler waveforms show normal direction of venous flow, normal respiratory plasticity and response to augmentation.  Limited views of the contralateral common femoral vein are unremarkable.  OTHER  Right calf subcutaneous edema.  Limitations: none  IMPRESSION: Negative.   Electronically Signed By: Beckie Salts M.D. On: 04/21/2021 10:45  No results found for this or any previous visit.  No results found for this or any previous visit.  No results found for this or any previous visit.  No results found for this or any previous visit.  No results found for this or any previous visit.  No results found for this or any previous visit.    Assessment & Plan: Urinary retention with history of DSD and VUR.  She remains on tamsulosin.   She is interested in seeing if she can forgo the foley.  I think that is unlikely to be successful but I will set her up for a voiding trial in a month.  If she fails, she can resume the foley and we can stop the tamsulosin.   No orders of the defined types were placed in this encounter.    No orders of the defined types were placed in this encounter.     Return in about 4 weeks (around 07/01/2023) for with sarah for a voiding trial and possible catheter exchange. .   CC: Arville Lime 06/04/2023

## 2023-06-28 DIAGNOSIS — Z0001 Encounter for general adult medical examination with abnormal findings: Secondary | ICD-10-CM | POA: Diagnosis not present

## 2023-07-01 DIAGNOSIS — M1612 Unilateral primary osteoarthritis, left hip: Secondary | ICD-10-CM | POA: Diagnosis not present

## 2023-07-01 DIAGNOSIS — M545 Low back pain, unspecified: Secondary | ICD-10-CM | POA: Diagnosis not present

## 2023-07-01 DIAGNOSIS — G894 Chronic pain syndrome: Secondary | ICD-10-CM | POA: Diagnosis not present

## 2023-07-01 DIAGNOSIS — K5903 Drug induced constipation: Secondary | ICD-10-CM | POA: Diagnosis not present

## 2023-07-01 DIAGNOSIS — Z79891 Long term (current) use of opiate analgesic: Secondary | ICD-10-CM | POA: Diagnosis not present

## 2023-07-01 DIAGNOSIS — M961 Postlaminectomy syndrome, not elsewhere classified: Secondary | ICD-10-CM | POA: Diagnosis not present

## 2023-07-07 ENCOUNTER — Ambulatory Visit: Payer: Medicare HMO | Admitting: Urology

## 2023-07-19 DIAGNOSIS — R339 Retention of urine, unspecified: Secondary | ICD-10-CM | POA: Insufficient documentation

## 2023-07-19 DIAGNOSIS — N3644 Muscular disorders of urethra: Secondary | ICD-10-CM | POA: Insufficient documentation

## 2023-07-19 DIAGNOSIS — Z978 Presence of other specified devices: Secondary | ICD-10-CM | POA: Insufficient documentation

## 2023-07-19 DIAGNOSIS — M47812 Spondylosis without myelopathy or radiculopathy, cervical region: Secondary | ICD-10-CM | POA: Insufficient documentation

## 2023-07-19 NOTE — Progress Notes (Signed)
 Name: Mckenzie [name] DOB: Jul 11, 1935 MRN: [id number]  History of Present Illness: Mckenzie [name] is a 88 y.o. female who presents today for follow up visit at [facility].  - GU history: 1. 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 - Managed with a chronic indwelling Foley catheter since 09/13/2019.   - Taking Flomax  (Tamsulosin ) 0.4 mg daily.  Recent history: - 05/30/2023: Seen in ER because catheter not draining appropriately and she was leaking around it. The catheter was repositioned.  At visit with Dr. Watt on 06/03/2023: - Catheter functioning appropriately. It was exchanged. - Discussed her constipation which impacts the catheter.   - She is interested in seeing if she can forgo the foley. I think that is unlikely to be successful but I will set her up for a voiding trial in a month. If she fails, she can resume the foley and we can stop the tamsulosin .  Today: She reports the catheter is draining well; denies gross hematuria. Reports new burning sensation in her bladder today. Denies fevers, flank pain, or abdominal pain.   Fall Screening: Do you usually have a device to assist in your mobility? Yes   Medications: Current Outpatient Medications  Medication Sig Dispense Refill   AMBULATORY NON FORMULARY MEDICATION Medication Name: Catheter Leg Bags 2 Bag 11   aspirin  EC 81 MG EC tablet Take 1 tablet (81 mg total) by mouth daily. Swallow whole. 30 tablet 11   atorvastatin  (LIPITOR) 40 MG tablet Take 1 tablet (40 mg total) by mouth daily. 30 tablet 1   Calcium  Carbonate (CALCIUM  600 PO) Take 1,200 mg by mouth daily.     Cholecalciferol  (VITAMIN D3) 1000 UNITS CAPS Take 1,000 Units by mouth daily. Take one tab daily     clopidogrel  (PLAVIX ) 75 MG tablet Take 1 tablet (75 mg total) by mouth daily. 20 tablet 0   conjugated estrogens  (PREMARIN ) vaginal cream Place 1 Applicatorful vaginally  daily.     diphenhydrAMINE  (BENADRYL ) 25 mg capsule Take 25 mg by mouth daily as needed for sleep.     FERREX 150 150 MG capsule Take 150 mg by mouth daily.     HYDROcodone -acetaminophen  (NORCO) 10-325 MG tablet Take 1 tablet by mouth every 4 (four) hours as needed for severe pain. 30 tablet 0   leptospermum manuka honey (MEDIHONEY) PSTE paste Apply 1 application. topically daily.     levothyroxine  (SYNTHROID ) 25 MCG tablet TAKE 1 TABLET BY MOUTH DAILY AT 6 AM. (Patient taking differently: Take 25 mcg by mouth daily before breakfast.) 30 tablet 0   Melatonin 10 MG TABS Take 1 tablet by mouth at bedtime.     metoprolol  succinate (TOPROL -XL) 25 MG 24 hr tablet Take 25 mg by mouth daily.     Multiple Vitamin (MULTIVITAMIN) tablet Take 1 tablet by mouth daily.     nitrofurantoin , macrocrystal-monohydrate, (MACROBID ) 100 MG capsule Take 1 capsule (100 mg total) by mouth 2 (two) times daily for 7 days. 14 capsule 0   nitroGLYCERIN  (NITROSTAT ) 0.4 MG SL tablet Place under the tongue.     polyethylene glycol (MIRALAX  / GLYCOLAX ) 17 g packet Take 17 g by mouth every morning.     raloxifene  (EVISTA ) 60 MG tablet Take 60 mg by mouth daily.     temazepam  (RESTORIL ) 30 MG capsule Take 30 mg by mouth at bedtime.     tretinoin  (RETIN-A ) 0.05 % cream Apply 1 application topically at bedtime.  APIXABAN  (ELIQUIS ) VTE STARTER PACK (10MG  AND 5MG ) Take as directed on package: start with two-5mg  tablets twice daily for 7 days. On day 8, switch to one-5mg  tablet twice daily. (Patient not taking: Reported on 07/23/2023) 1 each 0   benazepril  (LOTENSIN ) 20 MG tablet Take 1 tablet (20 mg total) by mouth daily. 30 tablet 0   MOVANTIK  25 MG TABS tablet Take 25 mg by mouth daily. (Patient not taking: Reported on 07/23/2023)     No current facility-administered medications for this visit.    Allergies: Allergies  Allergen Reactions   Tetracycline Nausea Only    Past Medical History:  Diagnosis Date   Aortic valve  sclerosis    Mild, echo, April, 2012   ARDS (adult respiratory distress syndrome) (HCC) 2003   with tracheostomy-peritonitis   Arthritis    Back pain    Need for surgery, Dr. Oneil Carwin   CAD (coronary artery disease)    Stent to LAD, 2003 [facility] / nuclear scan April, 201 to artifact / persistent shortness of breath / catheterization Oct 21, 2010... widely patent stent to the mid LAD, no significant obstructive disease, should be low risk    for back surgery   Carotid arterial disease (HCC)    Moderate-Dr. Serene following   Chronic back pain    COPD (chronic obstructive pulmonary disease) (HCC)    Diastolic dysfunction    Echo, April, 2011   EF 65%   Dysrhythmia    afib   Ejection fraction    EF 65%, echo, September 15, 2010   GERD (gastroesophageal reflux disease)    Hemolytic anemia (HCC)    Followed by Dr. Drake   Hypertension    Hypothyroidism    Left ventricular hypertrophy    EF 65%, echo, April, 2011   MR (mitral regurgitation)     Mild echo, 2012 ///  Mild per Echo 2009,  mild, echo, April, 2011   Myocardial infarction [facility])    Palpitations    Holter, September 16, 2010, PACs and PVCs,one 4 beat run of SVT   Pneumonia 11-2011   Preoperative clearance    Preop clearance for back surgery   Shortness of breath    March, 2012   UTI (urinary tract infection)    Chronic-on Macrodantin    Past Surgical History:  Procedure Laterality Date   APPENDECTOMY     BACK SURGERY     BREAST LUMPECTOMY Left    CATARACT EXTRACTION W/PHACO Right 12/05/2012   Procedure: CATARACT EXTRACTION PHACO AND INTRAOCULAR LENS PLACEMENT (IOC);  Surgeon: Cherene Mania, MD;  Location: [facility];  Service: Ophthalmology;  Laterality: Right;  CDE:15.30   CATARACT EXTRACTION W/PHACO Left 12/15/2012   Procedure: CATARACT EXTRACTION PHACO AND INTRAOCULAR LENS PLACEMENT (IOC);  Surgeon: Cherene Mania, MD;  Location: [facility];  Service: Ophthalmology;  Laterality: Left;  CDE:22.56   CORONARY ANGIOPLASTY WITH STENT PLACEMENT   2003   INTRAMEDULLARY (IM) NAIL INTERTROCHANTERIC Left 09/27/2020   Procedure: INTRAMEDULLARY (IM) NAIL INTERTROCHANTRIC;  Surgeon: Addie Cordella Hamilton, MD;  Location: [facility] OR;  Service: Orthopedics;  Laterality: Left;  left trochanter   PACEMAKER IMPLANT N/A 04/23/2021   Procedure: PACEMAKER IMPLANT;  Surgeon: Inocencio Soyla Lunger, MD;  Location: [facility] INVASIVE CV LAB;  Service: Cardiovascular;  Laterality: N/A;   repair of ruptured lumbar discs  12-2011   SPINE SURGERY     lumbar disk removal by Dr. Rosella at [facility]   THYROID  SURGERY  08/2009   Benign adenomas   TONSILLECTOMY  TRACHEOSTOMY  2003   Following ARDS    Family History  Problem Relation Age of Onset   Heart failure Father    Diabetes Son    Social History   Socioeconomic History   Marital status: Married    Spouse name: Not on file   Number of children: 1   Years of education: Not on file   Highest education level: Not on file  Occupational History   Occupation: Retired  Tobacco Use   Smoking status: Former    Current packs/day: 0.00    Average packs/day: 1 pack/day for 20.0 years (20.0 ttl pk-yrs)    Types: Cigarettes    Start date: 06/15/1980    Quit date: 06/15/2000    Years since quitting: 23.1   Smokeless tobacco: Never  Vaping Use   Vaping status: Never Used  Substance and Sexual Activity   Alcohol  use: No   Drug use: No   Sexual activity: Not on file  Other Topics Concern   Not on file  Social History Narrative   Regular Exercise: Yes-Walk   Social Drivers of Health   Financial Resource Strain: Low Risk  (10/20/2022)   Received from [facility], [facility]   Overall Financial Resource Strain (CARDIA)    Difficulty of Paying Living Expenses: Not hard at all  Food Insecurity: No Food Insecurity (10/20/2022)   Received from [facility], [facility]   Hunger Vital Sign    Worried About Running Out of Food in the Last Year: Never true    Ran Out of Food in the Last Year: Never  true  Transportation Needs: No Transportation Needs (10/20/2022)   Received from [facility], [facility]   PRAPARE - Transportation    Lack of Transportation (Medical): No    Lack of Transportation (Non-Medical): No  Physical Activity: Inactive (10/20/2022)   Received from [facility], [facility]   Exercise Vital Sign    Days of Exercise per Week: 0 days    Minutes of Exercise per Session: 0 min  Stress: No Stress Concern Present (10/20/2022)   Received from [facility], [facility] - Occupational Stress Questionnaire    Feeling of Stress : Not at all  Social Connections: Moderately Integrated (10/20/2022)   Received from [facility], [facility]   Social Connection and Isolation Panel [NHANES]    Frequency of Communication with Friends and Family: Once a week    Frequency of Social Gatherings with Friends and Family: Twice a week    Attends Religious Services: More than 4 times per year    Active Member of [employer] or Organizations: No    Attends Banker Meetings: Never    Marital Status: Married  Catering Manager Violence: Not At Risk (10/20/2022)   Received from [facility], [facility]   Humiliation, Afraid, Rape, and Kick questionnaire    Fear of Current or Ex-Partner: No    Emotionally Abused: No    Physically Abused: No    Sexually Abused: No    Review of Systems Constitutional: Patient denies any unintentional weight loss or change in strength lntegumentary: Patient denies any rashes or pruritus Cardiovascular: Patient denies chest pain or syncope Respiratory: Patient denies shortness of breath Gastrointestinal: Patient denies nausea, vomiting, constipation, or diarrhea Musculoskeletal: Patient denies muscle cramps or weakness Neurologic: Patient denies convulsions or seizures Allergic/Immunologic: Patient denies recent allergic reaction(s)  Hematologic/Lymphatic: Patient denies  bleeding tendencies Endocrine: Patient denies heat/cold intolerance  GU: As per HPI.  OBJECTIVE Vitals:   07/23/23 1030  BP: (!) 185/68  Pulse: 60   There is no height or weight on file to calculate BMI.  Physical Examination Constitutional: No obvious distress; patient is non-toxic appearing  Cardiovascular: No visible lower extremity edema.  Respiratory: The patient does not have audible wheezing/stridor; respirations do not appear labored  Gastrointestinal: Abdomen non-distended Musculoskeletal: Normal ROM of UEs  Skin: No obvious rashes/open sores  Neurologic: CN 2-12 grossly intact Psychiatric: Answered questions appropriately with normal affect  Hematologic/Lymphatic/Immunologic: No obvious bruises or sites of spontaneous bleeding   ASSESSMENT Chronic retention of urine - Plan: Bladder Voiding Trial, nitrofurantoin , macrocrystal-monohydrate, (MACROBID ) 100 MG capsule, Ambulatory referral to Interventional Radiology  Chronic indwelling Foley catheter - Plan: Bladder Voiding Trial, nitrofurantoin , macrocrystal-monohydrate, (MACROBID ) 100 MG capsule, Ambulatory referral to Interventional Radiology  DSD (detrusor and sphincter dyssynergia) - Plan: Bladder Voiding Trial, nitrofurantoin , macrocrystal-monohydrate, (MACROBID ) 100 MG capsule, Ambulatory referral to Interventional Radiology  Dysuria - Plan: nitrofurantoin , macrocrystal-monohydrate, (MACROBID ) 100 MG capsule  Failed voiding trial. We discussed the chronic nature of her urinary retention and permanent necessity for catheter use. We discussed risk related to chronic urinary retention / incomplete bladder emptying including but not limited to: bladder discomfort I pain, overflow urinary incontinence (which can result in skin breakdown / wounds), recurrent UTls, pyelonephritis, urosepsis, kidney damage/ failure, decreased kidney function requiring dialysis.  With reluctance she agreed to have indwelling Foley catheter  replaced today. We discussed the option of a suprapubic (SP) tube/catheter as an alternative. - One significant disadvantage with long term use of an indwelling Foley catheter is the risk for erosion of the urethral sphincter, which may result in irreversible gross urinary incontinence over time. - SP tube is placed via an outpatient surgical procedure in the OR under anesthesia. SP tube stays in place continuously. The insertion site will close if catheter is removed for more than a few hours (and is therefore a reversible intervention). Surgical risks may include but are not limited to: surgical site pain, wound infection, anesthesia risk, symptomatic UTI. SP tube may cause local skin irritation / breakdown, however this is generally preventable by keeping area clean and dry. - Both indwelling Foley catheter and SP tube need to be exchanged every 4 weeks by a nurse (either in office or via home health) to prevent catheter from becoming clogged with sediment.  - With any form of chronic catheter use their urine will always appear infected on UA due to colonization, which is called asymptomatic bacteriuria.   She elected to proceed with SP tube placement; referral placed to IR.  Will send antibiotic for symptomatic bacteriuria. She can discontinue Flomax .  Patient verbalized understanding of and agreement with current plan. All questions were answered.  PLAN Advised the following: 1. Macrobid  (Nitrofurantoin ) 100 mg 2x/day for 7 days. 2. Indwelling Foley catheter replaced. 3. Discontinue Flomax .  4. Referral placed to IR for SP tube placement.  Orders Placed This Encounter  Procedures   Ambulatory referral to Interventional Radiology    Referral Priority:   Routine    Referral Type:   Consultation    Referral Reason:   Specialty Services Required    Requested Specialty:   Interventional Radiology    Number of Visits Requested:   1   Bladder Voiding Trial    It has been explained that  the patient is to follow regularly with their PCP  in addition to all other providers involved in their care and to follow instructions provided by these respective offices. Patient advised to contact urology clinic if any urologic-pertaining questions, concerns, new symptoms or problems arise in the interim period.  There are no Patient Instructions on file for this visit.  Electronically signed by:  Lauraine JAYSON Oz, FNP   07/23/23    12:00 PM

## 2023-07-23 ENCOUNTER — Encounter: Payer: Self-pay | Admitting: Urology

## 2023-07-23 ENCOUNTER — Ambulatory Visit (INDEPENDENT_AMBULATORY_CARE_PROVIDER_SITE_OTHER): Payer: Medicare HMO | Admitting: Urology

## 2023-07-23 VITALS — BP 185/68 | HR 60

## 2023-07-23 DIAGNOSIS — R300 Dysuria: Secondary | ICD-10-CM | POA: Diagnosis not present

## 2023-07-23 DIAGNOSIS — Z978 Presence of other specified devices: Secondary | ICD-10-CM

## 2023-07-23 DIAGNOSIS — N3644 Muscular disorders of urethra: Secondary | ICD-10-CM

## 2023-07-23 DIAGNOSIS — R339 Retention of urine, unspecified: Secondary | ICD-10-CM | POA: Diagnosis not present

## 2023-07-23 LAB — URINALYSIS, ROUTINE W REFLEX MICROSCOPIC
Bilirubin, UA: NEGATIVE
Glucose, UA: NEGATIVE
Ketones, UA: NEGATIVE
Nitrite, UA: POSITIVE — AB
Specific Gravity, UA: 1.015 (ref 1.005–1.030)
Urobilinogen, Ur: 0.2 mg/dL (ref 0.2–1.0)
pH, UA: 7 (ref 5.0–7.5)

## 2023-07-23 LAB — MICROSCOPIC EXAMINATION: WBC, UA: >30 /[HPF] — AB (ref 0–5)

## 2023-07-23 MED ORDER — NITROFURANTOIN MONOHYD MACRO 100 MG PO CAPS: 100.0000 mg | ORAL_CAPSULE | Freq: Two times a day (BID) | ORAL | 0 refills | Status: AC

## 2023-07-23 NOTE — Progress Notes (Addendum)
 Fill and Pull Catheter Removal  Patient is present today for a catheter removal.  Patient was cleaned and prepped in a sterile fashion 100 ml of sterile water/ saline was instilled into the bladder when the patient felt the urge to urinate. 10 ml of water was then drained from the balloon.  A 16 FR foley cath was removed from the bladder no complications were noted .  Patient as then given some time to void on their own.  Patient cannot void  0 ml on their own after some time.  Patient tolerated well.  Performed by: Carlos, CMA  Follow up/ Additional notes: Keep follow up   Simple Catheter Placement  Due to urinary retention patient is present today for a foley cath placement.  Patient was cleaned and prepped in a sterile fashion with betadine  and 2% lidocaine  jelly was instilled into the urethra. A 16 FR foley catheter was inserted, urine return was noted  30 ml, urine was yellow in color.  The balloon was filled with 10cc of sterile water.  A leg bag was attached for drainage. Patient was also given a night bag to take home and was given instruction on how to change from one bag to another.  Patient was given instruction on proper catheter care.  Patient tolerated well, no complications were noted   Performed by: Carlos, CMA  Additional notes/ Follow up: No follow-ups on file.

## 2023-07-23 NOTE — Addendum Note (Signed)
 Addended by: Buddie Carina R on: 07/23/2023 12:14 PM   Modules accepted: Orders

## 2023-07-26 LAB — URINE CULTURE

## 2023-07-27 ENCOUNTER — Telehealth: Payer: Self-pay

## 2023-07-27 ENCOUNTER — Other Ambulatory Visit: Payer: Self-pay

## 2023-07-27 ENCOUNTER — Other Ambulatory Visit: Payer: Self-pay | Admitting: Urology

## 2023-07-27 DIAGNOSIS — Z978 Presence of other specified devices: Secondary | ICD-10-CM

## 2023-07-27 DIAGNOSIS — R339 Retention of urine, unspecified: Secondary | ICD-10-CM

## 2023-07-27 DIAGNOSIS — N3644 Muscular disorders of urethra: Secondary | ICD-10-CM

## 2023-07-27 NOTE — Addendum Note (Signed)
Addended by: Christoper Fabian R on: 07/27/2023 02:02 PM   Modules accepted: Orders

## 2023-07-27 NOTE — Telephone Encounter (Signed)
-----   Message from [place]ita [place] sent at 07/27/2023 10:43 AM EST ----- Please let pt know urine culture result. The Macrobid which was prescribed should cover this pathogen. Thanks.

## 2023-07-27 NOTE — Telephone Encounter (Signed)
Tried trying calling patient with no answer, left vm for return call to office.

## 2023-07-28 ENCOUNTER — Other Ambulatory Visit: Payer: Self-pay | Admitting: Urology

## 2023-07-28 ENCOUNTER — Telehealth: Payer: Self-pay

## 2023-07-28 DIAGNOSIS — N3644 Muscular disorders of urethra: Secondary | ICD-10-CM

## 2023-07-28 DIAGNOSIS — Z978 Presence of other specified devices: Secondary | ICD-10-CM

## 2023-07-28 DIAGNOSIS — R339 Retention of urine, unspecified: Secondary | ICD-10-CM

## 2023-07-28 NOTE — Telephone Encounter (Signed)
I spoke with patient and informed her that she will have the sp tube placed in [place] and will follow up here for monthly sp tube cath changes.  Patient voiced understanding.

## 2023-07-28 NOTE — Telephone Encounter (Signed)
[facility] states [facility] will have to updated to [facility] for SP tube placement.  I tried to call patient to inform her that her that she will have to be seen in [facility] for initial placement, and after that she will resume monthly catheter changes here in [place].  Patient did not answer, I left a voicemail requesting call back.

## 2023-07-29 DIAGNOSIS — M545 Low back pain, unspecified: Secondary | ICD-10-CM | POA: Diagnosis not present

## 2023-07-29 DIAGNOSIS — K5903 Drug induced constipation: Secondary | ICD-10-CM | POA: Diagnosis not present

## 2023-07-29 DIAGNOSIS — M961 Postlaminectomy syndrome, not elsewhere classified: Secondary | ICD-10-CM | POA: Diagnosis not present

## 2023-07-29 DIAGNOSIS — G894 Chronic pain syndrome: Secondary | ICD-10-CM | POA: Diagnosis not present

## 2023-07-29 DIAGNOSIS — Z79891 Long term (current) use of opiate analgesic: Secondary | ICD-10-CM | POA: Diagnosis not present

## 2023-07-29 DIAGNOSIS — M1612 Unilateral primary osteoarthritis, left hip: Secondary | ICD-10-CM | POA: Diagnosis not present

## 2023-08-05 ENCOUNTER — Other Ambulatory Visit: Payer: Self-pay | Admitting: Urology

## 2023-08-05 DIAGNOSIS — N3644 Muscular disorders of urethra: Secondary | ICD-10-CM

## 2023-08-05 DIAGNOSIS — Z978 Presence of other specified devices: Secondary | ICD-10-CM

## 2023-08-05 DIAGNOSIS — R300 Dysuria: Secondary | ICD-10-CM

## 2023-08-05 DIAGNOSIS — R339 Retention of urine, unspecified: Secondary | ICD-10-CM

## 2023-08-17 ENCOUNTER — Other Ambulatory Visit: Payer: Self-pay | Admitting: Urology

## 2023-08-17 DIAGNOSIS — Z978 Presence of other specified devices: Secondary | ICD-10-CM

## 2023-08-17 DIAGNOSIS — N3644 Muscular disorders of urethra: Secondary | ICD-10-CM

## 2023-08-17 DIAGNOSIS — R339 Retention of urine, unspecified: Secondary | ICD-10-CM

## 2023-08-17 DIAGNOSIS — R300 Dysuria: Secondary | ICD-10-CM

## 2023-08-19 ENCOUNTER — Other Ambulatory Visit: Payer: Self-pay | Admitting: Urology

## 2023-08-19 DIAGNOSIS — N3644 Muscular disorders of urethra: Secondary | ICD-10-CM

## 2023-08-19 DIAGNOSIS — R339 Retention of urine, unspecified: Secondary | ICD-10-CM

## 2023-08-19 DIAGNOSIS — R300 Dysuria: Secondary | ICD-10-CM

## 2023-08-19 DIAGNOSIS — Z978 Presence of other specified devices: Secondary | ICD-10-CM

## 2023-08-23 ENCOUNTER — Ambulatory Visit (INDEPENDENT_AMBULATORY_CARE_PROVIDER_SITE_OTHER)

## 2023-08-23 DIAGNOSIS — Z466 Encounter for fitting and adjustment of urinary device: Secondary | ICD-10-CM | POA: Diagnosis not present

## 2023-08-23 NOTE — Progress Notes (Signed)
 Patient seen today to have her catheter bag replaced.   Performed by: Alfonse Spruce. CMA

## 2023-08-24 ENCOUNTER — Other Ambulatory Visit: Payer: Self-pay | Admitting: Urology

## 2023-08-24 DIAGNOSIS — R300 Dysuria: Secondary | ICD-10-CM

## 2023-08-24 DIAGNOSIS — Z978 Presence of other specified devices: Secondary | ICD-10-CM

## 2023-08-24 DIAGNOSIS — R339 Retention of urine, unspecified: Secondary | ICD-10-CM

## 2023-08-24 DIAGNOSIS — N3644 Muscular disorders of urethra: Secondary | ICD-10-CM

## 2023-08-26 DIAGNOSIS — Z79891 Long term (current) use of opiate analgesic: Secondary | ICD-10-CM | POA: Diagnosis not present

## 2023-08-26 DIAGNOSIS — M545 Low back pain, unspecified: Secondary | ICD-10-CM | POA: Diagnosis not present

## 2023-08-26 DIAGNOSIS — K5903 Drug induced constipation: Secondary | ICD-10-CM | POA: Diagnosis not present

## 2023-08-26 DIAGNOSIS — M1612 Unilateral primary osteoarthritis, left hip: Secondary | ICD-10-CM | POA: Diagnosis not present

## 2023-08-26 DIAGNOSIS — G894 Chronic pain syndrome: Secondary | ICD-10-CM | POA: Diagnosis not present

## 2023-08-26 DIAGNOSIS — M961 Postlaminectomy syndrome, not elsewhere classified: Secondary | ICD-10-CM | POA: Diagnosis not present

## 2023-08-27 ENCOUNTER — Ambulatory Visit (HOSPITAL_COMMUNITY): Payer: Medicare HMO

## 2023-08-28 DIAGNOSIS — E785 Hyperlipidemia, unspecified: Secondary | ICD-10-CM | POA: Diagnosis not present

## 2023-08-28 DIAGNOSIS — Z681 Body mass index (BMI) 19 or less, adult: Secondary | ICD-10-CM | POA: Diagnosis not present

## 2023-08-28 DIAGNOSIS — I441 Atrioventricular block, second degree: Secondary | ICD-10-CM | POA: Diagnosis not present

## 2023-08-28 DIAGNOSIS — I10 Essential (primary) hypertension: Secondary | ICD-10-CM | POA: Diagnosis not present

## 2023-08-28 DIAGNOSIS — Z955 Presence of coronary angioplasty implant and graft: Secondary | ICD-10-CM | POA: Diagnosis not present

## 2023-09-20 DIAGNOSIS — R300 Dysuria: Secondary | ICD-10-CM | POA: Diagnosis not present

## 2023-09-20 DIAGNOSIS — R8290 Unspecified abnormal findings in urine: Secondary | ICD-10-CM | POA: Diagnosis not present

## 2023-09-20 DIAGNOSIS — Z681 Body mass index (BMI) 19 or less, adult: Secondary | ICD-10-CM | POA: Diagnosis not present

## 2023-09-23 DIAGNOSIS — M545 Low back pain, unspecified: Secondary | ICD-10-CM | POA: Diagnosis not present

## 2023-09-23 DIAGNOSIS — M961 Postlaminectomy syndrome, not elsewhere classified: Secondary | ICD-10-CM | POA: Diagnosis not present

## 2023-09-23 DIAGNOSIS — M1612 Unilateral primary osteoarthritis, left hip: Secondary | ICD-10-CM | POA: Diagnosis not present

## 2023-09-23 DIAGNOSIS — G894 Chronic pain syndrome: Secondary | ICD-10-CM | POA: Diagnosis not present

## 2023-09-23 DIAGNOSIS — K5903 Drug induced constipation: Secondary | ICD-10-CM | POA: Diagnosis not present

## 2023-09-23 DIAGNOSIS — Z79891 Long term (current) use of opiate analgesic: Secondary | ICD-10-CM | POA: Diagnosis not present

## 2023-09-27 ENCOUNTER — Ambulatory Visit

## 2023-09-29 DIAGNOSIS — R5383 Other fatigue: Secondary | ICD-10-CM | POA: Diagnosis not present

## 2023-09-29 DIAGNOSIS — N179 Acute kidney failure, unspecified: Secondary | ICD-10-CM | POA: Diagnosis not present

## 2023-09-29 DIAGNOSIS — R300 Dysuria: Secondary | ICD-10-CM | POA: Diagnosis not present

## 2023-09-29 DIAGNOSIS — N390 Urinary tract infection, site not specified: Secondary | ICD-10-CM | POA: Diagnosis not present

## 2023-10-01 ENCOUNTER — Ambulatory Visit

## 2023-10-21 DIAGNOSIS — M1612 Unilateral primary osteoarthritis, left hip: Secondary | ICD-10-CM | POA: Diagnosis not present

## 2023-10-21 DIAGNOSIS — M961 Postlaminectomy syndrome, not elsewhere classified: Secondary | ICD-10-CM | POA: Diagnosis not present

## 2023-10-21 DIAGNOSIS — M545 Low back pain, unspecified: Secondary | ICD-10-CM | POA: Diagnosis not present

## 2023-10-21 DIAGNOSIS — K5903 Drug induced constipation: Secondary | ICD-10-CM | POA: Diagnosis not present

## 2023-10-21 DIAGNOSIS — G894 Chronic pain syndrome: Secondary | ICD-10-CM | POA: Diagnosis not present

## 2023-10-21 DIAGNOSIS — Z79891 Long term (current) use of opiate analgesic: Secondary | ICD-10-CM | POA: Diagnosis not present

## 2023-10-28 ENCOUNTER — Ambulatory Visit (INDEPENDENT_AMBULATORY_CARE_PROVIDER_SITE_OTHER)

## 2023-10-28 DIAGNOSIS — R339 Retention of urine, unspecified: Secondary | ICD-10-CM | POA: Diagnosis not present

## 2023-10-28 NOTE — Progress Notes (Signed)
 Cath Change/ Replacement  Patient is present today for a catheter change due to urinary retention.  10 ml of water was removed from the balloon, a 16 FR foley cath was removed without difficulty.  Patient was cleaned and prepped in a sterile fashion with Betadinex3.  A 16  FR foley cath was replaced into the bladder, no complications were noted. Urine return was noted 50 ml and urine was Clear yellow in color. The balloon was filled with 10ml of sterile water. A leg bag was attached for drainage.  A night bag was also given to the patient and patient was given instruction on how to change from one bag to another. Patient was given proper instruction on catheter care.    Performed by: Gorden Latino, CMA  Follow up: Keep monthly cath change nurse visited

## 2023-11-23 ENCOUNTER — Ambulatory Visit

## 2023-11-25 ENCOUNTER — Ambulatory Visit: Admitting: Urology

## 2023-11-25 ENCOUNTER — Ambulatory Visit

## 2023-11-25 DIAGNOSIS — Z79891 Long term (current) use of opiate analgesic: Secondary | ICD-10-CM | POA: Diagnosis not present

## 2023-11-25 DIAGNOSIS — M961 Postlaminectomy syndrome, not elsewhere classified: Secondary | ICD-10-CM | POA: Diagnosis not present

## 2023-11-25 DIAGNOSIS — K5903 Drug induced constipation: Secondary | ICD-10-CM | POA: Diagnosis not present

## 2023-11-25 DIAGNOSIS — M1612 Unilateral primary osteoarthritis, left hip: Secondary | ICD-10-CM | POA: Diagnosis not present

## 2023-11-25 DIAGNOSIS — M545 Low back pain, unspecified: Secondary | ICD-10-CM | POA: Diagnosis not present

## 2023-11-25 DIAGNOSIS — G894 Chronic pain syndrome: Secondary | ICD-10-CM | POA: Diagnosis not present

## 2023-11-26 ENCOUNTER — Ambulatory Visit

## 2023-12-09 ENCOUNTER — Ambulatory Visit (INDEPENDENT_AMBULATORY_CARE_PROVIDER_SITE_OTHER): Admitting: Urology

## 2023-12-09 ENCOUNTER — Encounter: Payer: Self-pay | Admitting: Urology

## 2023-12-09 VITALS — BP 169/63 | HR 69

## 2023-12-09 DIAGNOSIS — R300 Dysuria: Secondary | ICD-10-CM | POA: Diagnosis not present

## 2023-12-09 DIAGNOSIS — Z978 Presence of other specified devices: Secondary | ICD-10-CM

## 2023-12-09 DIAGNOSIS — N3644 Muscular disorders of urethra: Secondary | ICD-10-CM | POA: Diagnosis not present

## 2023-12-09 DIAGNOSIS — R339 Retention of urine, unspecified: Secondary | ICD-10-CM

## 2023-12-09 DIAGNOSIS — N1370 Vesicoureteral-reflux, unspecified: Secondary | ICD-10-CM

## 2023-12-09 NOTE — Progress Notes (Signed)
 Subjective:  1. Urinary retention   2. Chronic indwelling Foley catheter   3. DSD (detrusor and sphincter dyssynergia)   4. Vesico-ureteral reflux   5. Dysuria     12/09/23: Mckenzie [name] returns today in f/u.  She has had the catheter for 4 years and has failed prior voiding trials. She is requesting a voiding trial for her history of retention.  She was having burning last Friday but that has resolved.  She has had no hematuria.    GU Hx: Mckenzie [name] is an 88 yo year old female with multiple medical problems including history of urinary retention who is managed with a chronic foley.  She had to go to the ER on Monday because it wasn't draining and she was leaking around it.  She had the foley repositioned and is doing better.  She is do for a change today.  She has constipation which impacts the catheter. She has had no hematuria and only 1 UTI in the past 6 months.  She has been managed with the foley since 09/13/19.  She has a history of DSD with retention and bilateral VUR.   She remains on tamsulosin .   I reviewed my records from [facility] to update her history.     ROS:  ROS:  A complete review of systems was performed.  All systems are negative except for pertinent findings as noted.   Review of Systems  Gastrointestinal:  Positive for constipation. Negative for diarrhea.    Allergies  Allergen Reactions   Tetracycline Nausea Only    Outpatient Encounter Medications as of 12/09/2023  Medication Sig Note   AMBULATORY NON FORMULARY MEDICATION Medication Name: Catheter Leg Bags    aspirin  EC 81 MG EC tablet Take 1 tablet (81 mg total) by mouth daily. Swallow whole.    atorvastatin  (LIPITOR) 40 MG tablet Take 1 tablet (40 mg total) by mouth daily.    benazepril  (LOTENSIN ) 20 MG tablet Take 1 tablet (20 mg total) by mouth daily. 10/06/2021: LF:09/22/2021 20 MG TABS (disp 30, 30d supply) pt unsure if she is taking   Calcium  Carbonate (CALCIUM  600 PO) Take 1,200 mg by mouth daily.     Cholecalciferol  (VITAMIN D3) 1000 UNITS CAPS Take 1,000 Units by mouth daily. Take one tab daily    clopidogrel  (PLAVIX ) 75 MG tablet Take 1 tablet (75 mg total) by mouth daily.    conjugated estrogens  (PREMARIN ) vaginal cream Place 1 Applicatorful vaginally daily.    diphenhydrAMINE  (BENADRYL ) 25 mg capsule Take 25 mg by mouth daily as needed for sleep.    FERREX 150 150 MG capsule Take 150 mg by mouth daily. 10/06/2021: LF:07/30/2021 150 MG CAPS (disp 30, 30d supply)    HYDROcodone -acetaminophen  (NORCO) 10-325 MG tablet Take 1 tablet by mouth every 4 (four) hours as needed for severe pain. 10/06/2021: LF:09/08/2021 10-325 MG TABS (disp 150, 30d supply)    leptospermum manuka honey (MEDIHONEY) PSTE paste Apply 1 application. topically daily.    levothyroxine  (SYNTHROID ) 25 MCG tablet TAKE 1 TABLET BY MOUTH DAILY AT 6 AM. (Patient taking differently: Take 25 mcg by mouth daily before breakfast.) 10/06/2021: LF:08/06/2021 25 MCG TABS (disp 30, 30d supply)    Melatonin 10 MG TABS Take 1 tablet by mouth at bedtime.    metoprolol  succinate (TOPROL -XL) 25 MG 24 hr tablet Take 25 mg by mouth daily.    Multiple Vitamin (MULTIVITAMIN) tablet Take 1 tablet by mouth daily.    nitroGLYCERIN  (NITROSTAT ) 0.4 MG SL tablet Place under the tongue.  polyethylene glycol (MIRALAX  / GLYCOLAX ) 17 g packet Take 17 g by mouth every morning.    raloxifene  (EVISTA ) 60 MG tablet Take 60 mg by mouth daily. 10/06/2021: LF:09/22/2021 60 MG TABS (disp 30, 30d supply) pt unsure of this med   temazepam  (RESTORIL ) 30 MG capsule Take 30 mg by mouth at bedtime. 10/06/2021: LF:09/22/2021 30 MG CAPS (disp 30, 30d supply)    tretinoin  (RETIN-A ) 0.05 % cream Apply 1 application topically at bedtime.    APIXABAN  (ELIQUIS ) VTE STARTER PACK (10MG  AND 5MG ) Take as directed on package: start with two-5mg  tablets twice daily for 7 days. On day 8, switch to one-5mg  tablet twice daily. (Patient not taking: Reported on 12/09/2023)    MOVANTIK  25 MG  TABS tablet Take 25 mg by mouth daily. (Patient not taking: Reported on 12/09/2023) 10/06/2021: LF:08/25/2021 25 MG TABS (disp 30, 30d supply) pt unsure of this med   No facility-administered encounter medications on file as of 12/09/2023.    Past Medical History:  Diagnosis Date   Aortic valve sclerosis    Mild, echo, April, 2012   ARDS (adult respiratory distress syndrome) (HCC) 2003   with tracheostomy-peritonitis   Arthritis    Back pain    Need for surgery, Dr. Oneil Carwin   CAD (coronary artery disease)    Stent to LAD, 2003 [facility] / nuclear scan April, 201 to artifact / persistent shortness of breath / catheterization Oct 21, 2010... widely patent stent to the mid LAD, no significant obstructive disease, should be low risk    for back surgery   Carotid arterial disease (HCC)    Moderate-Dr. Serene following   Chronic back pain    COPD (chronic obstructive pulmonary disease) (HCC)    Diastolic dysfunction    Echo, April, 2011   EF 65%   Dysrhythmia    afib   Ejection fraction    EF 65%, echo, September 15, 2010   GERD (gastroesophageal reflux disease)    Hemolytic anemia (HCC)    Followed by Dr. Drake   Hypertension    Hypothyroidism    Left ventricular hypertrophy    EF 65%, echo, April, 2011   MR (mitral regurgitation)     Mild echo, 2012 ///  Mild per Echo 2009,  mild, echo, April, 2011   Myocardial infarction [facility])    Palpitations    Holter, September 16, 2010, PACs and PVCs,one 4 beat run of SVT   Pneumonia 11-2011   Preoperative clearance    Preop clearance for back surgery   Shortness of breath    March, 2012   UTI (urinary tract infection)    Chronic-on Macrodantin     Past Surgical History:  Procedure Laterality Date   APPENDECTOMY     BACK SURGERY     BREAST LUMPECTOMY Left    CATARACT EXTRACTION W/PHACO Right 12/05/2012   Procedure: CATARACT EXTRACTION PHACO AND INTRAOCULAR LENS PLACEMENT (IOC);  Surgeon: Cherene Mania, MD;  Location: [facility];  Service: Ophthalmology;   Laterality: Right;  CDE:15.30   CATARACT EXTRACTION W/PHACO Left 12/15/2012   Procedure: CATARACT EXTRACTION PHACO AND INTRAOCULAR LENS PLACEMENT (IOC);  Surgeon: Cherene Mania, MD;  Location: [facility];  Service: Ophthalmology;  Laterality: Left;  CDE:22.56   CORONARY ANGIOPLASTY WITH STENT PLACEMENT  2003   INTRAMEDULLARY (IM) NAIL INTERTROCHANTERIC Left 09/27/2020   Procedure: INTRAMEDULLARY (IM) NAIL INTERTROCHANTRIC;  Surgeon: Addie Cordella Hamilton, MD;  Location: [facility] OR;  Service: Orthopedics;  Laterality: Left;  left trochanter   PACEMAKER IMPLANT N/A  04/23/2021   Procedure: PACEMAKER IMPLANT;  Surgeon: Inocencio Soyla Lunger, MD;  Location: [facility] INVASIVE CV [facility];  Service: Cardiovascular;  Laterality: N/A;   repair of ruptured lumbar discs  12-2011   SPINE SURGERY     lumbar disk removal by Dr. Rosella at [facility]   THYROID  SURGERY  08/2009   Benign adenomas   TONSILLECTOMY     TRACHEOSTOMY  2003   Following ARDS     Social History   Socioeconomic History   Marital status: Married    Spouse name: Not on file   Number of children: 1   Years of education: Not on file   Highest education level: Not on file  Occupational History   Occupation: Retired  Tobacco Use   Smoking status: Former    Current packs/day: 0.00    Average packs/day: 1 pack/day for 20.0 years (20.0 ttl pk-yrs)    Types: Cigarettes    Start date: 06/15/1980    Quit date: 06/15/2000    Years since quitting: 23.5   Smokeless tobacco: Never  Vaping Use   Vaping status: Never Used  Substance and Sexual Activity   Alcohol  use: No   Drug use: No   Sexual activity: Not on file  Other Topics Concern   Not on file  Social History Narrative   Regular Exercise: Yes-Walk   Social Drivers of Health   Financial Resource Strain: Low Risk  (10/20/2022)   Received from [facility]   Overall Financial Resource Strain (CARDIA)    Difficulty of Paying Living Expenses: Not hard at all  Food Insecurity: No Food Insecurity  (10/20/2022)   Received from [facility]   Hunger Vital Sign    Within the past 12 months, you worried that your food would run out before you got the money to buy more.: Never true    Within the past 12 months, the food you bought just didn't last and you didn't have money to get more.: Never true  Transportation Needs: No Transportation Needs (10/20/2022)   Received from [facility]   PRAPARE - Transportation    Lack of Transportation (Medical): No    Lack of Transportation (Non-Medical): No  Physical Activity: Inactive (10/20/2022)   Received from [facility]   Exercise Vital Sign    On average, how many days per week do you engage in moderate to strenuous exercise (like a brisk walk)?: 0 days    On average, how many minutes do you engage in exercise at this level?: 0 min  Stress: No Stress Concern Present (10/20/2022)   Received from [facility] of Occupational [facility] - Occupational Stress Questionnaire    Feeling of Stress : Not at all  Social Connections: Moderately Integrated (10/20/2022)   Received from [facility]   Social Connection and Isolation Panel    In a typical week, how many times do you talk on the phone with family, friends, or neighbors?: Once a week    How often do you get together with friends or relatives?: Twice a week    How often do you attend church or religious services?: More than 4 times per year    Do you belong to any clubs or organizations such as church groups, unions, fraternal or athletic groups, or school groups?: No    How often do you attend meetings of the clubs or organizations you belong to?: Never    Are you married, widowed, divorced,  separated, never married, or living with a partner?: Married  Intimate Partner Violence: Not At Risk (10/20/2022)   Received from [facility]   Humiliation, Afraid, Rape, and Kick questionnaire    Within the last year, have you been afraid of your partner or ex-partner?: No     Within the last year, have you been humiliated or emotionally abused in other ways by your partner or ex-partner?: No    Within the last year, have you been kicked, hit, slapped, or otherwise physically hurt by your partner or ex-partner?: No    Within the last year, have you been raped or forced to have any kind of sexual activity by your partner or ex-partner?: No    Family History  Problem Relation Age of Onset   Heart failure Father    Diabetes Son        Objective: Vitals:   12/09/23 1351  BP: (!) 169/63  Pulse: 69     Physical Exam  Lab Results:  PSA No results found for: PSA No results found for: TESTOSTERONE    Studies/Results: No results found. No results found for this or any previous visit.   No results found for this or any previous visit.  No results found for this or any previous visit.  No results found for this or any previous visit.  No results found for this or any previous visit.  No results found for this or any previous visit.  No results found for this or any previous visit.    Assessment & Plan: Urinary retention with history of DSD and VUR.  She remains on tamsulosin .   She is interested in seeing if she can forgo the foley.  I think that is unlikely to be successful but I will set her up for a voiding trial in a week.  If she fails, she can resume the foley.   No orders of the defined types were placed in this encounter.    Orders Placed This Encounter  Procedures   Urine Culture   INSERT,TEMP INDWELLING BLAD CATH,SIMPLE      Return in about 1 week (around 12/16/2023) for for a voiding trial.  The f/u with Lauraine in a month. .   CC: Skillman, Katherine E, PA-C      Mckenzie [name] 12/10/2023

## 2023-12-09 NOTE — Progress Notes (Signed)
 Cath Change/ Replacement  Patient is present today for a catheter change due to urinary retention.  10ml of water was removed from the balloon, a 16FR foley cath was removed without difficulty.  Patient was cleaned and prepped in a sterile fashion with Betadinex3.  A 16 FR foley cath was replaced into the bladder, no complications were noted. Urine return was noted 10ml and urine was Dark yellow and cloudy in color. The balloon was filled with 10ml of sterile water. A leg bag was attached for drainage. Patient was given proper instruction on catheter care.    Performed by: Exie DASEN. CMA  Follow up: 4 weeks

## 2023-12-13 ENCOUNTER — Ambulatory Visit: Payer: Self-pay

## 2023-12-13 LAB — URINE CULTURE

## 2023-12-13 NOTE — Telephone Encounter (Signed)
-----   Message from Lauraine JAYSON Oz sent at 12/13/2023 12:14 PM EDT ----- Negative urine culture. ----- Message ----- From: Sammie Exie HERO, CMA Sent: 12/13/2023  10:02 AM EDT To: Lauraine JAYSON Oz, FNP  Please review. ----- Message ----- From: Rebecka Memos Lab Results In Sent: 12/13/2023   5:35 AM EDT To: [facility]  Clinical

## 2023-12-13 NOTE — Telephone Encounter (Signed)
Tried calling patient with no answer. Left vm for return call

## 2023-12-30 DIAGNOSIS — G894 Chronic pain syndrome: Secondary | ICD-10-CM | POA: Diagnosis not present

## 2023-12-30 DIAGNOSIS — Z79891 Long term (current) use of opiate analgesic: Secondary | ICD-10-CM | POA: Diagnosis not present

## 2023-12-30 DIAGNOSIS — K5903 Drug induced constipation: Secondary | ICD-10-CM | POA: Diagnosis not present

## 2023-12-30 DIAGNOSIS — M1612 Unilateral primary osteoarthritis, left hip: Secondary | ICD-10-CM | POA: Diagnosis not present

## 2023-12-30 DIAGNOSIS — M545 Low back pain, unspecified: Secondary | ICD-10-CM | POA: Diagnosis not present

## 2023-12-30 DIAGNOSIS — M961 Postlaminectomy syndrome, not elsewhere classified: Secondary | ICD-10-CM | POA: Diagnosis not present

## 2024-01-06 ENCOUNTER — Ambulatory Visit

## 2024-01-06 ENCOUNTER — Ambulatory Visit (INDEPENDENT_AMBULATORY_CARE_PROVIDER_SITE_OTHER)

## 2024-01-06 DIAGNOSIS — R339 Retention of urine, unspecified: Secondary | ICD-10-CM | POA: Diagnosis not present

## 2024-01-06 MED ORDER — CIPROFLOXACIN HCL 500 MG PO TABS: 500.0000 mg | ORAL_TABLET | Freq: Once | ORAL | Status: AC

## 2024-01-06 NOTE — Patient Instructions (Signed)

## 2024-01-06 NOTE — Progress Notes (Unsigned)
 Cath Change/ Replacement  Patient is present today for a catheter change due to urinary retention.  10ml of water  was removed from the balloon, a 16FR foley cath was removed without difficulty.  Patient was cleaned and prepped in a sterile fashion with Betadinex3.  A 16 FR foley cath was replaced into the bladder, no complications were noted. Urine return was noted 20ml and urine was Clear yellow in color. The balloon was filled with 10ml of sterile water . A leg bag was attached for drainage. Patient was given proper instruction on catheter care.    Performed by: Paxon Propes Lpn  Follow up: 4 weeks

## 2024-02-02 DIAGNOSIS — Z79891 Long term (current) use of opiate analgesic: Secondary | ICD-10-CM | POA: Diagnosis not present

## 2024-02-02 DIAGNOSIS — K5903 Drug induced constipation: Secondary | ICD-10-CM | POA: Diagnosis not present

## 2024-02-02 DIAGNOSIS — G894 Chronic pain syndrome: Secondary | ICD-10-CM | POA: Diagnosis not present

## 2024-02-02 DIAGNOSIS — M1612 Unilateral primary osteoarthritis, left hip: Secondary | ICD-10-CM | POA: Diagnosis not present

## 2024-02-02 DIAGNOSIS — M545 Low back pain, unspecified: Secondary | ICD-10-CM | POA: Diagnosis not present

## 2024-02-02 DIAGNOSIS — M961 Postlaminectomy syndrome, not elsewhere classified: Secondary | ICD-10-CM | POA: Diagnosis not present

## 2024-02-07 ENCOUNTER — Ambulatory Visit

## 2024-02-09 ENCOUNTER — Ambulatory Visit

## 2024-03-01 DIAGNOSIS — K5903 Drug induced constipation: Secondary | ICD-10-CM | POA: Diagnosis not present

## 2024-03-01 DIAGNOSIS — Z79891 Long term (current) use of opiate analgesic: Secondary | ICD-10-CM | POA: Diagnosis not present

## 2024-03-01 DIAGNOSIS — M961 Postlaminectomy syndrome, not elsewhere classified: Secondary | ICD-10-CM | POA: Diagnosis not present

## 2024-03-01 DIAGNOSIS — M545 Low back pain, unspecified: Secondary | ICD-10-CM | POA: Diagnosis not present

## 2024-03-01 DIAGNOSIS — M1612 Unilateral primary osteoarthritis, left hip: Secondary | ICD-10-CM | POA: Diagnosis not present

## 2024-03-01 DIAGNOSIS — G894 Chronic pain syndrome: Secondary | ICD-10-CM | POA: Diagnosis not present

## 2024-03-10 ENCOUNTER — Emergency Department (HOSPITAL_COMMUNITY)

## 2024-03-10 ENCOUNTER — Other Ambulatory Visit: Payer: Self-pay

## 2024-03-10 ENCOUNTER — Emergency Department (HOSPITAL_COMMUNITY): Admission: EM | Admit: 2024-03-10 | Discharge: 2024-03-10 | Disposition: A | Discharge status: Home / Self Care

## 2024-03-10 ENCOUNTER — Encounter (HOSPITAL_COMMUNITY): Payer: Self-pay

## 2024-03-10 DIAGNOSIS — I2510 Atherosclerotic heart disease of native coronary artery without angina pectoris: Secondary | ICD-10-CM | POA: Diagnosis not present

## 2024-03-10 DIAGNOSIS — K8020 Calculus of gallbladder without cholecystitis without obstruction: Secondary | ICD-10-CM | POA: Diagnosis not present

## 2024-03-10 DIAGNOSIS — R918 Other nonspecific abnormal finding of lung field: Secondary | ICD-10-CM | POA: Diagnosis not present

## 2024-03-10 DIAGNOSIS — Z7982 Long term (current) use of aspirin: Secondary | ICD-10-CM | POA: Diagnosis not present

## 2024-03-10 DIAGNOSIS — J8410 Pulmonary fibrosis, unspecified: Secondary | ICD-10-CM | POA: Diagnosis not present

## 2024-03-10 DIAGNOSIS — J4489 Other specified chronic obstructive pulmonary disease: Secondary | ICD-10-CM | POA: Diagnosis not present

## 2024-03-10 DIAGNOSIS — N12 Tubulo-interstitial nephritis, not specified as acute or chronic: Secondary | ICD-10-CM | POA: Insufficient documentation

## 2024-03-10 DIAGNOSIS — I509 Heart failure, unspecified: Secondary | ICD-10-CM | POA: Diagnosis not present

## 2024-03-10 DIAGNOSIS — N281 Cyst of kidney, acquired: Secondary | ICD-10-CM | POA: Diagnosis not present

## 2024-03-10 DIAGNOSIS — J40 Bronchitis, not specified as acute or chronic: Secondary | ICD-10-CM | POA: Diagnosis not present

## 2024-03-10 DIAGNOSIS — R1032 Left lower quadrant pain: Secondary | ICD-10-CM | POA: Diagnosis not present

## 2024-03-10 DIAGNOSIS — R109 Unspecified abdominal pain: Secondary | ICD-10-CM | POA: Diagnosis present

## 2024-03-10 DIAGNOSIS — I10 Essential (primary) hypertension: Secondary | ICD-10-CM | POA: Diagnosis not present

## 2024-03-10 LAB — URINALYSIS, W/ REFLEX TO CULTURE (INFECTION SUSPECTED)
Bilirubin Urine: NEGATIVE
Glucose, UA: NEGATIVE mg/dL
Ketones, ur: 20 mg/dL — AB
Nitrite: NEGATIVE
Protein, ur: 100 mg/dL — AB
RBC / HPF: >50 RBC/hpf (ref 0–5)
Specific Gravity, Urine: 1.014 (ref 1.005–1.030)
WBC, UA: >50 WBC/hpf (ref 0–5)
pH: 6 (ref 5.0–8.0)

## 2024-03-10 LAB — COMPREHENSIVE METABOLIC PANEL WITH GFR
ALT: 12 U/L (ref 0–44)
AST: 19 U/L (ref 15–41)
Albumin: 3.8 g/dL (ref 3.5–5.0)
Alkaline Phosphatase: 68 U/L (ref 38–126)
Anion gap: 13 (ref 5–15)
BUN: 15 mg/dL (ref 8–23)
CO2: 25 mmol/L (ref 22–32)
Calcium: 8.6 mg/dL — ABNORMAL LOW (ref 8.9–10.3)
Chloride: 100 mmol/L (ref 98–111)
Creatinine, Ser: 0.39 mg/dL — ABNORMAL LOW (ref 0.44–1.00)
GFR, Estimated: >60 mL/min (ref >60)
Glucose, Bld: 101 mg/dL — ABNORMAL HIGH (ref 70–99)
Potassium: 3.7 mmol/L (ref 3.5–5.1)
Sodium: 138 mmol/L (ref 135–145)
Total Bilirubin: 0.8 mg/dL (ref 0.0–1.2)
Total Protein: 6.7 g/dL (ref 6.5–8.1)

## 2024-03-10 LAB — CBC
HCT: 37.8 % (ref 36.0–46.0)
Hemoglobin: 12.7 g/dL (ref 12.0–15.0)
MCH: 30.9 pg (ref 26.0–34.0)
MCHC: 33.6 g/dL (ref 30.0–36.0)
MCV: 92 fL (ref 80.0–100.0)
Platelets: 134 K/uL — ABNORMAL LOW (ref 150–400)
RBC: 4.11 MIL/uL (ref 3.87–5.11)
RDW: 13.3 % (ref 11.5–15.5)
WBC: 9.3 K/uL (ref 4.0–10.5)
nRBC: 0 % (ref 0.0–0.2)

## 2024-03-10 LAB — LACTIC ACID, PLASMA: Lactic Acid, Venous: 0.7 mmol/L (ref 0.5–1.9)

## 2024-03-10 LAB — PROTIME-INR
INR: 0.9 (ref 0.8–1.2)
Prothrombin Time: 13 s (ref 11.4–15.2)

## 2024-03-10 MED ORDER — ONDANSETRON HCL 4 MG/2ML IJ SOLN
4.0000 mg | Freq: Once | INTRAMUSCULAR | Status: AC
  Administered 2024-03-10: 4 mg via INTRAVENOUS
  Filled 2024-03-10: qty 2

## 2024-03-10 MED ORDER — IOHEXOL 300 MG/ML  SOLN
100.0000 mL | Freq: Once | INTRAMUSCULAR | Status: AC | PRN
  Administered 2024-03-10: 80 mL via INTRAVENOUS

## 2024-03-10 MED ORDER — SODIUM CHLORIDE 0.9 % IV SOLN
2.0000 g | Freq: Once | INTRAVENOUS | Status: AC
  Administered 2024-03-10: 2 g via INTRAVENOUS
  Filled 2024-03-10: qty 20

## 2024-03-10 MED ORDER — MORPHINE SULFATE (PF) 2 MG/ML IV SOLN
2.0000 mg | Freq: Once | INTRAVENOUS | Status: AC
  Administered 2024-03-10: 2 mg via INTRAVENOUS
  Filled 2024-03-10: qty 1

## 2024-03-10 MED ORDER — LACTATED RINGERS IV SOLN: INTRAVENOUS | Status: DC

## 2024-03-10 MED ORDER — CEFPODOXIME PROXETIL 200 MG PO TABS: 200.0000 mg | ORAL_TABLET | Freq: Two times a day (BID) | ORAL | 0 refills | Status: AC

## 2024-03-10 NOTE — Discharge Instructions (Addendum)
 Your workup this evening shows that you have a urinary tract infection.  You have been given antibiotics here but will need to take additional antibiotics for the next several days.  Please take as directed until finished.  Please call your primary care provider or your urologist on Monday for recheck and please drink plenty of water.  As discussed, return to the emergency department if you develop any new or worsening symptoms such as increasing pain fever or vomiting.

## 2024-03-10 NOTE — ED Notes (Signed)
 Drainage back switched to leg bag.

## 2024-03-10 NOTE — ED Notes (Signed)
 ED Provider at bedside.

## 2024-03-10 NOTE — ED Triage Notes (Signed)
 Pt stated that she is having left flank pain that started a week ago. Pt has indwelling catheter. Pt also has been having fevers according to husnad

## 2024-03-10 NOTE — ED Notes (Signed)
  at bedside

## 2024-03-13 LAB — URINE CULTURE: Culture: >=100000 — AB

## 2024-03-13 NOTE — ED Provider Notes (Signed)
 [place] EMERGENCY DEPARTMENT AT [facility] Provider Note   CSN: [id number] Arrival date & time: 03/10/24  1718     Patient presents with: Flank Pain   Mckenzie [name] is a 88 y.o. female.    Flank Pain Pertinent negatives include no chest pain and no abdominal pain.        Mckenzie [name] is a 88 y.o. female with past medical history of CAD, GERD, anemia, CHF and chronic indwelling Foley catheter who presents to the Emergency Department complaining of left flank pain x 1 week.  Describes pain as aching.  Patient spouse who is at bedside also provides history and states that she has been having intermittent fevers at home.  She states that she has indwelling Foley catheter due to chronic urinary retention.  Denies chills, abdominal pain, nausea or vomiting.  She typically has catheter exchanged every 30 days and states that is due to be changed.  No history of kidney stones   Prior to Admission medications   Medication Sig Start Date End Date Taking? Authorizing Provider  cefpodoxime  (VANTIN ) 200 MG tablet Take 1 tablet (200 mg total) by mouth 2 (two) times daily. 03/10/24  Yes Chavon Lucarelli, PA-C  AMBULATORY NON FORMULARY MEDICATION Medication Name: Catheter Leg Bags 11/06/22   Sherrilee Belvie CROME, MD  aspirin  EC 81 MG EC tablet Take 1 tablet (81 mg total) by mouth daily. Swallow whole. 10/07/21   Evonnie Lenis, MD  atorvastatin  (LIPITOR) 40 MG tablet Take 1 tablet (40 mg total) by mouth daily. 10/06/21   Evonnie Lenis, MD  benazepril  (LOTENSIN ) 20 MG tablet Take 1 tablet (20 mg total) by mouth daily. 04/25/21 12/09/23  Vernon Ranks, MD  Calcium  Carbonate (CALCIUM  600 PO) Take 1,200 mg by mouth daily.    [provider]  Cholecalciferol  (VITAMIN D3) 1000 UNITS CAPS Take 1,000 Units by mouth daily. Take one tab daily    [provider]  clopidogrel  (PLAVIX ) 75 MG tablet Take 1 tablet (75 mg total) by mouth daily. 10/07/21   Evonnie Lenis, MD  conjugated  estrogens  (PREMARIN ) vaginal cream Place 1 Applicatorful vaginally daily.    [provider]  diphenhydrAMINE  (BENADRYL ) 25 mg capsule Take 25 mg by mouth daily as needed for sleep.    [provider]  FERREX 150 150 MG capsule Take 150 mg by mouth daily. 04/25/21   [provider]  HYDROcodone -acetaminophen  (NORCO) 10-325 MG tablet Take 1 tablet by mouth every 4 (four) hours as needed for severe pain. 10/11/20   Love, Sharlet RAMAN, PA-C  leptospermum manuka honey (MEDIHONEY) PSTE paste Apply 1 application. topically daily. 10/07/21   Evonnie Lenis, MD  levothyroxine  (SYNTHROID ) 50 MCG tablet Take 50 mcg by mouth daily. 01/14/24   [provider]  LINZESS 145 MCG CAPS capsule Take 145 mcg by mouth daily.    [provider]  Melatonin 10 MG TABS Take 1 tablet by mouth at bedtime.    [provider]  metoprolol  succinate (TOPROL -XL) 25 MG 24 hr tablet Take 25 mg by mouth daily. 09/29/21   [provider]  Multiple Vitamin (MULTIVITAMIN) tablet Take 1 tablet by mouth daily.    [provider]  nitroGLYCERIN  (NITROSTAT ) 0.4 MG SL tablet Place under the tongue.    [provider]  polyethylene glycol (MIRALAX  / GLYCOLAX ) 17 g packet Take 17 g by mouth every morning.    [provider]  raloxifene  (EVISTA ) 60 MG tablet Take 60 mg by mouth  daily. 04/13/19   [provider]  sodium fluoride (FLUORISHIELD) 1.1 % GEL dental gel Place 1 Application onto teeth at bedtime. 02/08/24   [provider]  temazepam  (RESTORIL ) 30 MG capsule Take 30 mg by mouth at bedtime.    [provider]  tretinoin  (RETIN-A ) 0.05 % cream Apply 1 application topically at bedtime.    [provider]    Allergies: Tetracycline    Review of Systems  Constitutional:  Positive for fever. Negative for appetite change and chills.  Cardiovascular:  Negative for chest pain.  Gastrointestinal:  Negative for abdominal pain,  nausea and vomiting.  Genitourinary:  Positive for flank pain.  Neurological:  Negative for dizziness and syncope.  Psychiatric/Behavioral:  Negative for confusion.     Updated Vital Signs BP (!) 176/88   Pulse 71   Temp 99.5 F (37.5 C) (Oral)   Resp 15   Ht 5' 3 (1.6 m)   Wt 49 kg   SpO2 92%   BMI 19.13 kg/m   Physical Exam Vitals and nursing note reviewed.  Constitutional:      General: She is not in acute distress.    Appearance: Normal appearance. She is not toxic-appearing.  Cardiovascular:     Rate and Rhythm: Normal rate and regular rhythm.     Pulses: Normal pulses.  Pulmonary:     Effort: Pulmonary effort is normal.  Abdominal:     Palpations: Abdomen is soft.     Tenderness: There is no abdominal tenderness. There is no right CVA tenderness or left CVA tenderness.  Skin:    General: Skin is warm.     Capillary Refill: Capillary refill takes less than 2 seconds.     Findings: No rash.  Neurological:     General: No focal deficit present.     Mental Status: She is alert.     Sensory: No sensory deficit.     Motor: No weakness.     (all labs ordered are listed, but only abnormal results are displayed) Labs Reviewed  URINE CULTURE - Abnormal; Notable for the following components:      Result Value   Culture >=100,000 COLONIES/mL ESCHERICHIA COLI (*)    Organism ID, Bacteria ESCHERICHIA COLI (*)    All other components within normal limits  CBC - Abnormal; Notable for the following components:   Platelets 134 (*)    All other components within normal limits  URINALYSIS, W/ REFLEX TO CULTURE (INFECTION SUSPECTED) - Abnormal; Notable for the following components:   APPearance HAZY (*)    Hgb urine dipstick LARGE (*)    Ketones, ur 20 (*)    Protein, ur 100 (*)    Leukocytes,Ua LARGE (*)    Bacteria, UA RARE (*)    All other components within normal limits  COMPREHENSIVE METABOLIC PANEL WITH GFR - Abnormal; Notable for the following components:    Glucose, Bld 101 (*)    Creatinine, Ser 0.39 (*)    Calcium  8.6 (*)    All other components within normal limits  CULTURE, BLOOD (ROUTINE X 2)  CULTURE, BLOOD (ROUTINE X 2)  LACTIC ACID, PLASMA  PROTIME-INR    EKG: EKG Interpretation Date/Time:  Friday March 10 2024 19:12:41 EDT Ventricular Rate:  71 PR Interval:  153 QRS Duration:  127 QT Interval:  414 QTC Calculation: 450 R Axis:   -53  Text Interpretation: Sinus rhythm RBBB and LAFB Probable left ventricular hypertrophy Confirmed by Simon Rea [phone]) on 03/10/2024 7:15:31 PM  Radiology: CT ABDOMEN PELVIS W CONTRAST Result Date: 03/10/2024 CLINICAL DATA:  Left lower quadrant abdominal and flank pain, fever EXAM: CT ABDOMEN AND PELVIS WITH CONTRAST TECHNIQUE: Multidetector CT imaging of the abdomen and pelvis was performed using the standard protocol following bolus administration of intravenous contrast. RADIATION DOSE REDUCTION: This exam was performed according to the departmental dose-optimization program which includes automated exposure control, adjustment of the mA and/or kV according to patient size and/or use of iterative reconstruction technique. CONTRAST:  80mL OMNIPAQUE  IOHEXOL  300 MG/ML  SOLN COMPARISON:  09/06/2019 FINDINGS: Lower chest: No acute pleural or parenchymal lung disease. Dense calcification of the mitral annulus. Hepatobiliary: The gallbladder is moderately distended with calcified gallstones identified. No gallbladder wall thickening or pericholecystic fluid. Liver is unremarkable. Pancreas: Unremarkable. No pancreatic ductal dilatation or surrounding inflammatory changes. Spleen: Normal in size without focal abnormality. Adrenals/Urinary Tract: 3.1 cm right renal cyst with a single thin septation, consistent with Bosniak type 2 cyst. No specific follow-up recommended. No other focal renal abnormalities. No urinary tract calculi or obstructive uropathy. The bladder is decompressed bite indwelling Foley  catheter. Nonspecific bladder wall thickening and perivesicular fat stranding given decompressed state, cystitis cannot be excluded. Stomach/Bowel: No bowel obstruction or ileus. No bowel wall thickening or inflammatory change. Prior appendectomy. Vascular/Lymphatic: Aortic atherosclerosis. No enlarged abdominal or pelvic lymph nodes. Reproductive: Uterus and bilateral adnexa are unremarkable. Other: No free fluid or free intraperitoneal gas. No abdominal wall hernia. Musculoskeletal: Prior left hip ORIF. There are healed bilateral superior and inferior pubic rami fractures. Extensive postsurgical changes within the lower lumbar spine. Spinal stimulator, leads entering the central canal at the T12 level and extending cephalad beyond the field of view. Since the prior exam, there are new T12 and L1 compression deformities. At T12 there is less than 25% loss of height, wall at L1 there is approximately 50% loss of height. Mild retropulsion at the L1 compression fracture site, with narrowing of the central canal by proximally 50%. These fractures are age indeterminate, but appear chronic. Reconstructed images demonstrate no additional findings. IMPRESSION: 1. Marked bladder wall thickening and perivesicular fat stranding, nonspecific given decompressed state and indwelling Foley catheter. Cystitis cannot be excluded. Please correlate with urinalysis. 2. Cholelithiasis without evidence of acute cholecystitis. 3. Interval T12 and L1 compression fracture, likely chronic. Approximately 50% loss of height at L1 with narrowing of the central canal by approximately 50%. 4.  Aortic Atherosclerosis (ICD10-I70.0). Electronically Signed   By: Ozell Daring M.D.   On: 03/10/2024 19:59   DG Chest Port 1 View Result Date: 03/10/2024 CLINICAL DATA:  Question of sepsis to evaluate for abnormality. Left flank pain starting a week ago. Fevers. EXAM: PORTABLE CHEST 1 VIEW COMPARISON:  10/06/2021 FINDINGS: Cardiac pacemaker. Spinal  stimulator leads with tips projecting over the lower to midthoracic region. Heart size and pulmonary vascularity are normal. Emphysematous changes in the lungs. Peribronchial thickening with perihilar and upper lobe interstitial changes likely representing bronchiectasis and fibrosis. No airspace infiltration or consolidation. No pleural effusion or pneumothorax. Mediastinal contours appear intact. Calcification of the aorta. Degenerative changes in the spine and shoulders. IMPRESSION: Emphysematous and chronic bronchitic changes in the lungs with perihilar and upper lung fibrosis consistent with chronic interstitial disease. No focal consolidation. Electronically Signed   By: Elsie Gravely M.D.   On: 03/10/2024 18:48      Procedures   Medications Ordered in the ED  morphine  (PF) 2 MG/ML injection 2 mg (2 mg Intravenous Given 03/10/24 1854)  ondansetron  (  ZOFRAN ) injection 4 mg (4 mg Intravenous Given 03/10/24 1854)  iohexol  (OMNIPAQUE ) 300 MG/ML solution 100 mL (80 mLs Intravenous Contrast Given 03/10/24 1949)  cefTRIAXone  (ROCEPHIN ) 2 g in sodium chloride  0.9 % 100 mL IVPB (0 g Intravenous Stopped 03/10/24 2113)                                    Medical Decision Making Patient here with indwelling Foley catheter complains of flank pain, subjective fever at home, no significant fever here.  No nausea vomiting or abdominal pain on exam patient well-appearing.  She does have urine in her Foley catheter bag.  She states her catheter has not been changed in nearly 30 days.  I doubt sepsis, but urine infection of high clinical concern.  Possible pyelonephritis.  Kidney stone also considered but felt less likely.  Amount and/or Complexity of Data Reviewed Labs: ordered.    Details: No leukocytosis, her lactic acid is unremarkable chemistries without significant derangement.  Foley catheter was exchanged and urine sample obtained from newly catheterized sample.  Urinalysis shows large blood large  leukocytes and greater than 50 white cells.  Urine culture pending Radiology: ordered.    Details: Chest x-ray without focal consolidation  CT abdomen and pelvis shows bladder wall thickening and perivesical stranding.  Cholelithiasis without acute cholecystitis there is an interval T12 and L1 compression fracture that is felt to be chronic ECG/medicine tests: ordered.    Details: EKG shows sinus rhythm with right bundle branch block and left anterior fascicular block Discussion of management or test interpretation with external provider(s): Patient with likely urinary infection possible developing pyelonephritis as she is having pain of her left flank and subjective fevers at home.  Her Foley catheter was exchanged here and sample was from new catheter.  Urine culture is pending.  Will give Rocephin  here, will treat with cephalosporin, feel that she is appropriate for discharge home.  Interval compression fractures seen on CT felt to be old.  Patient does not have significant midline tenderness on exam and she is neurovascularly intact.  She will follow-up closely outpatient with her urologist for recheck.  Return precautions were given.  Risk Prescription drug management.        Final diagnoses:  Pyelonephritis    ED Discharge Orders          Ordered    cefpodoxime  (VANTIN ) 200 MG tablet  2 times daily        03/10/24 2128               Herlinda Milling, PA-C 03/13/24 1609    Simon Lavonia SAILOR, MD 03/13/24 1616

## 2024-03-14 ENCOUNTER — Telehealth (HOSPITAL_BASED_OUTPATIENT_CLINIC_OR_DEPARTMENT_OTHER): Payer: Self-pay | Admitting: *Deleted

## 2024-03-14 DIAGNOSIS — Z681 Body mass index (BMI) 19 or less, adult: Secondary | ICD-10-CM | POA: Diagnosis not present

## 2024-03-14 DIAGNOSIS — R109 Unspecified abdominal pain: Secondary | ICD-10-CM | POA: Diagnosis not present

## 2024-03-14 DIAGNOSIS — R0781 Pleurodynia: Secondary | ICD-10-CM | POA: Diagnosis not present

## 2024-03-14 NOTE — Telephone Encounter (Signed)
 Post ED Visit - Positive Culture Follow-up  Culture report reviewed by antimicrobial stewardship pharmacist: [facility] []  Rankin Dee, Pharm.D. []  Venetia Gully, Pharm.D., BCPS AQ-ID []  Garrel Crews, Pharm.D., BCPS []  Almarie Lunger, Pharm.D., BCPS []  [place], [place].D., BCPS, AAHIVP []  Rosaline Bihari, Pharm.D., BCPS, AAHIVP [x]  Vernell Meier, PharmD, BCPS []  Latanya Hint, PharmD, BCPS []  Donald Medley, PharmD, BCPS []  Rocky Bold, PharmD []  Dorothyann Alert, PharmD, BCPS []  Morene Babe, PharmD  [facility] []  Rosaline Edison, PharmD []  Romona Bliss, PharmD []  Dolphus Roller, PharmD []  Veva Seip, Rph []  Vernell Daunt) Leonce, PharmD []  Eva Allis, PharmD []  Rosaline Millet, PharmD []  Iantha Batch, PharmD []  Arvin Gauss, PharmD []  Wanda Hasting, PharmD []  Ronal Rav, PharmD []  Rocky Slade, PharmD []  Bard Jeans, PharmD   Positive urine culture Treated with cefpodoxime  proxetil, organism sensitive to the same and no further patient follow-up is required at this time.  Lorita Barnie Pereyra 03/14/2024, 9:18 AM

## 2024-03-15 LAB — CULTURE, BLOOD (ROUTINE X 2)
Culture: NO GROWTH
Culture: NO GROWTH
Special Requests: ADEQUATE
Special Requests: ADEQUATE

## 2024-03-28 ENCOUNTER — Ambulatory Visit

## 2024-03-29 ENCOUNTER — Ambulatory Visit (INDEPENDENT_AMBULATORY_CARE_PROVIDER_SITE_OTHER)

## 2024-03-29 DIAGNOSIS — R339 Retention of urine, unspecified: Secondary | ICD-10-CM | POA: Diagnosis not present

## 2024-03-29 MED ORDER — CIPROFLOXACIN HCL 500 MG PO TABS
500.0000 mg | ORAL_TABLET | Freq: Once | ORAL | Status: AC
  Administered 2024-03-29: 500 mg via ORAL

## 2024-03-29 NOTE — Progress Notes (Addendum)
 Cath Change/ Replacement  Patient is present today for a catheter change due to urinary retention.  10 ml of water was removed from the balloon, a 14 FR foley cath was removed without difficulty.  Patient was cleaned and prepped in a sterile fashion with Betadinex3.  A 14  FR foley cath was replaced into the bladder, no complications were noted. Flush return was noted 50 ml and flush was Clear yellow in color. The balloon was filled with 10ml of sterile water. A leg bag was attached for drainage.  A night bag was also given to the patient and patient was given instruction on how to change from one bag to another. Patient was given proper instruction on catheter care.    Performed by: Carlos, CMA  Follow up: Keep 1 month cath change

## 2024-04-05 ENCOUNTER — Ambulatory Visit

## 2024-04-05 DIAGNOSIS — Z79891 Long term (current) use of opiate analgesic: Secondary | ICD-10-CM | POA: Diagnosis not present

## 2024-04-05 DIAGNOSIS — M961 Postlaminectomy syndrome, not elsewhere classified: Secondary | ICD-10-CM | POA: Diagnosis not present

## 2024-04-05 DIAGNOSIS — M1612 Unilateral primary osteoarthritis, left hip: Secondary | ICD-10-CM | POA: Diagnosis not present

## 2024-04-05 DIAGNOSIS — K5903 Drug induced constipation: Secondary | ICD-10-CM | POA: Diagnosis not present

## 2024-04-05 DIAGNOSIS — M545 Low back pain, unspecified: Secondary | ICD-10-CM | POA: Diagnosis not present

## 2024-04-05 DIAGNOSIS — G894 Chronic pain syndrome: Secondary | ICD-10-CM | POA: Diagnosis not present

## 2024-04-19 ENCOUNTER — Ambulatory Visit: Attending: Physician Assistant

## 2024-04-25 ENCOUNTER — Ambulatory Visit

## 2024-04-27 ENCOUNTER — Emergency Department (HOSPITAL_COMMUNITY)
Admission: EM | Admit: 2024-04-27 | Discharge: 2024-04-27 | Disposition: A | Discharge status: Home / Self Care | Attending: Emergency Medicine | Admitting: Emergency Medicine

## 2024-04-27 ENCOUNTER — Other Ambulatory Visit: Payer: Self-pay

## 2024-04-27 ENCOUNTER — Emergency Department (HOSPITAL_COMMUNITY)

## 2024-04-27 DIAGNOSIS — W08XXXA Fall from other furniture, initial encounter: Secondary | ICD-10-CM | POA: Diagnosis not present

## 2024-04-27 DIAGNOSIS — M160 Bilateral primary osteoarthritis of hip: Secondary | ICD-10-CM | POA: Diagnosis not present

## 2024-04-27 DIAGNOSIS — S32591A Other specified fracture of right pubis, initial encounter for closed fracture: Secondary | ICD-10-CM | POA: Diagnosis not present

## 2024-04-27 DIAGNOSIS — M25551 Pain in right hip: Secondary | ICD-10-CM | POA: Diagnosis not present

## 2024-04-27 DIAGNOSIS — Z7982 Long term (current) use of aspirin: Secondary | ICD-10-CM | POA: Insufficient documentation

## 2024-04-27 DIAGNOSIS — S7001XA Contusion of right hip, initial encounter: Secondary | ICD-10-CM | POA: Diagnosis not present

## 2024-04-27 DIAGNOSIS — M8580 Other specified disorders of bone density and structure, unspecified site: Secondary | ICD-10-CM | POA: Diagnosis not present

## 2024-04-27 DIAGNOSIS — W19XXXA Unspecified fall, initial encounter: Secondary | ICD-10-CM

## 2024-04-27 MED ORDER — DICLOFENAC SODIUM 1 % EX GEL: 2.0000 g | Freq: Four times a day (QID) | CUTANEOUS | 0 refills | Status: AC

## 2024-04-27 NOTE — ED Provider Notes (Signed)
 [place] EMERGENCY DEPARTMENT AT [facility] Provider Note   CSN: [id number] Arrival date & time: 04/27/24  9082     Patient presents with: Jailin Manocchio is a 88 y.o. female.   Patient is an 88 year old female who presents to the Emergency Department with a chief complaint of pain to the right hip.  Patient notes that she was getting up off of a barstool yesterday when she tripped and fell onto her right hip.  Patient notes that she has a history of chronic back pain but denies any worsening pain to her back at this time.  She has had no associated numbness or paresthesias.  She notes that she has been able to ambulate without difficulty.  She notes that she had swelling in the hip last night which has resolved.  She denies any associated abdominal pain, chest pain.  She did not strike her head or neck during the fall.   Fall       Prior to Admission medications   Medication Sig Start Date End Date Taking? Authorizing Provider  AMBULATORY NON FORMULARY MEDICATION Medication Name: Catheter Leg Bags 11/06/22   Sherrilee Belvie CROME, MD  aspirin  EC 81 MG EC tablet Take 1 tablet (81 mg total) by mouth daily. Swallow whole. 10/07/21   Evonnie Lenis, MD  atorvastatin  (LIPITOR) 40 MG tablet Take 1 tablet (40 mg total) by mouth daily. 10/06/21   Evonnie Lenis, MD  benazepril  (LOTENSIN ) 20 MG tablet Take 1 tablet (20 mg total) by mouth daily. 04/25/21 12/09/23  Pahwani, Ravi, MD  Calcium  Carbonate (CALCIUM  600 PO) Take 1,200 mg by mouth daily.    [provider]  cefpodoxime  (VANTIN ) 200 MG tablet Take 1 tablet (200 mg total) by mouth 2 (two) times daily. 03/10/24   Triplett, Tammy, PA-C  Cholecalciferol  (VITAMIN D3) 1000 UNITS CAPS Take 1,000 Units by mouth daily. Take one tab daily    [provider]  clopidogrel  (PLAVIX ) 75 MG tablet Take 1 tablet (75 mg total) by mouth daily. 10/07/21   Evonnie Lenis, MD  conjugated estrogens  (PREMARIN ) vaginal cream Place 1  Applicatorful vaginally daily.    [provider]  diphenhydrAMINE  (BENADRYL ) 25 mg capsule Take 25 mg by mouth daily as needed for sleep.    [provider]  FERREX 150 150 MG capsule Take 150 mg by mouth daily. 04/25/21   [provider]  HYDROcodone -acetaminophen  (NORCO) 10-325 MG tablet Take 1 tablet by mouth every 4 (four) hours as needed for severe pain. 10/11/20   Love, Sharlet RAMAN, PA-C  leptospermum manuka honey (MEDIHONEY) PSTE paste Apply 1 application. topically daily. 10/07/21   Evonnie Lenis, MD  levothyroxine  (SYNTHROID ) 50 MCG tablet Take 50 mcg by mouth daily. 01/14/24   [provider]  LINZESS 145 MCG CAPS capsule Take 145 mcg by mouth daily.    [provider]  Melatonin 10 MG TABS Take 1 tablet by mouth at bedtime.    [provider]  metoprolol  succinate (TOPROL -XL) 25 MG 24 hr tablet Take 25 mg by mouth daily. 09/29/21   [provider]  Multiple Vitamin (MULTIVITAMIN) tablet Take 1 tablet by mouth daily.    [provider]  nitroGLYCERIN  (NITROSTAT ) 0.4 MG SL tablet Place under the tongue.    [provider]  polyethylene glycol (MIRALAX  / GLYCOLAX ) 17 g packet Take 17 g by mouth every morning.    [provider]  raloxifene  (EVISTA ) 60 MG tablet Take 60 mg  by mouth daily. 04/13/19   [provider]  sodium fluoride (FLUORISHIELD) 1.1 % GEL dental gel Place 1 Application onto teeth at bedtime. 02/08/24   [provider]  temazepam  (RESTORIL ) 30 MG capsule Take 30 mg by mouth at bedtime.    [provider]  tretinoin  (RETIN-A ) 0.05 % cream Apply 1 application topically at bedtime.    [provider]    Allergies: Tetracycline    Review of Systems  Musculoskeletal:        Right hip pain  All other systems reviewed and are negative.   Updated Vital Signs BP 138/70   Pulse 71   Temp 97.8 F (36.6 C)   Resp 18   Ht 5' 3 (1.6 m)   Wt 44.5 kg   SpO2 96%    BMI 17.36 kg/m   Physical Exam Vitals and nursing note reviewed.  Constitutional:      General: She is not in acute distress.    Appearance: Normal appearance. She is not ill-appearing.  HENT:     Head: Normocephalic and atraumatic.     Nose: Nose normal.     Mouth/Throat:     Mouth: Mucous membranes are moist.  Eyes:     Extraocular Movements: Extraocular movements intact.     Conjunctiva/sclera: Conjunctivae normal.     Pupils: Pupils are equal, round, and reactive to light.  Cardiovascular:     Rate and Rhythm: Normal rate and regular rhythm.     Pulses: Normal pulses.     Heart sounds: Normal heart sounds. No murmur heard.    No gallop.  Pulmonary:     Effort: Pulmonary effort is normal. No respiratory distress.     Breath sounds: Normal breath sounds. No stridor. No wheezing, rhonchi or rales.  Abdominal:     General: Abdomen is flat. Bowel sounds are normal. There is no distension.     Palpations: Abdomen is soft.     Tenderness: There is no abdominal tenderness. There is no guarding.  Musculoskeletal:        General: Normal range of motion.     Cervical back: Normal range of motion and neck supple. No rigidity or tenderness.     Comments: Tender to palpation of the lateral aspect of the right hip, nontender palpation remainder bilateral lower extremities, pelvis stable to AP and lateral compression, no obvious deformity or bruising, no skin breakdown or ulceration, no lacerations or abrasions, peripheral pulses 2+ distally, sensation intact distally, full range of motion noted throughout, nontender palpation over thoracic or lumbar spine, no step-off or deformity  Skin:    General: Skin is warm and dry.     Findings: No bruising or rash.  Neurological:     General: No focal deficit present.     Mental Status: She is alert and oriented to person, place, and time. Mental status is at baseline.     Cranial Nerves: No cranial nerve deficit.     Sensory: No sensory  deficit.     Motor: No weakness.     Coordination: Coordination normal.     Gait: Gait normal.  Psychiatric:        Mood and Affect: Mood normal.        Behavior: Behavior normal.        Thought Content: Thought content normal.        Judgment: Judgment normal.     (all labs ordered are listed, but only abnormal results are displayed) Labs Reviewed -  No data to display  EKG: None  Radiology: No results found.   Procedures   Medications Ordered in the ED - No data to display                                  Medical Decision Making Patient is doing well at this time and is stable for discharge home.  Discussed with patient that x-ray of the right hip was unremarked for any signs of acute osseous injury or lesions.  She had no tenderness to palpation over remainder of bilateral upper and lower extremities.  She has nontender palpation of her cervical, thoracic, lumbar spine.  She did not strike her head during the fall.  She has no concern neurological deficits.  Do not suspect CT scan is warranted at this time as she is ambulatory under her own power without difficulty.  Low suspicion for occult fracture.  Close follow-up with PCP was discussed as well as strict return precautions for any new or worsening symptoms.  Patient voiced understanding and had no additional questions.  Amount and/or Complexity of Data Reviewed Radiology: ordered.        Final diagnoses:  None    ED Discharge Orders     None          Daralene Lonni JONETTA DEVONNA 04/27/24 1113    Suzette Pac, MD 04/28/24 1644

## 2024-04-27 NOTE — Discharge Instructions (Signed)
 Please follow-up closely with your primary care doctor on an outpatient basis.  Return to emergency department immediately for any new or worsening symptoms.

## 2024-04-27 NOTE — ED Triage Notes (Signed)
 Pt was sitting on bar stool yesterday and while trying to get up fell onto right hip. Denies hitting head and denies LOC. Pt states hip was swollen yesterday but has been able to walk some. Pt has chronic back pain and took her hydrocodone  this morning. Hip pain 5/10 at this time.

## 2024-04-28 ENCOUNTER — Ambulatory Visit

## 2024-04-30 ENCOUNTER — Encounter (HOSPITAL_COMMUNITY): Payer: Self-pay

## 2024-04-30 ENCOUNTER — Emergency Department (HOSPITAL_COMMUNITY)
Admission: EM | Admit: 2024-04-30 | Discharge: 2024-04-30 | Discharge status: Against Medical Advice | Attending: Emergency Medicine | Admitting: Emergency Medicine

## 2024-04-30 ENCOUNTER — Other Ambulatory Visit: Payer: Self-pay

## 2024-04-30 DIAGNOSIS — R1031 Right lower quadrant pain: Secondary | ICD-10-CM | POA: Insufficient documentation

## 2024-04-30 DIAGNOSIS — Z5321 Procedure and treatment not carried out due to patient leaving prior to being seen by health care provider: Secondary | ICD-10-CM | POA: Insufficient documentation

## 2024-04-30 LAB — URINALYSIS, ROUTINE W REFLEX MICROSCOPIC
Bilirubin Urine: NEGATIVE
Glucose, UA: NEGATIVE mg/dL
Ketones, ur: NEGATIVE mg/dL
Nitrite: POSITIVE — AB
Protein, ur: 100 mg/dL — AB
Specific Gravity, Urine: 1.024 (ref 1.005–1.030)
WBC, UA: >50 WBC/hpf (ref 0–5)
pH: 5 (ref 5.0–8.0)

## 2024-04-30 NOTE — ED Notes (Signed)
 Checked room multiple times, no pt visualized, pt did not respond to call and text

## 2024-04-30 NOTE — ED Triage Notes (Signed)
 Pt states she is hurting in her RLQ from her catheter. States she is also having cold chills with shaking since this afternoon. Denies N/V/D, SOB. Pt A&O x4 at time of triage with no signs of distress.  Reports urine passing into foley bag with no trouble.

## 2024-04-30 NOTE — ED Notes (Signed)
 Pt has not been in room for some time.  Not visualized in lobby. Call to cell number listed on chart [phone]. No answer. Text message sent to number as voicemailbox is full

## 2024-05-03 ENCOUNTER — Telehealth: Payer: Self-pay

## 2024-05-03 DIAGNOSIS — Z79891 Long term (current) use of opiate analgesic: Secondary | ICD-10-CM | POA: Diagnosis not present

## 2024-05-03 DIAGNOSIS — M545 Low back pain, unspecified: Secondary | ICD-10-CM | POA: Diagnosis not present

## 2024-05-03 DIAGNOSIS — M961 Postlaminectomy syndrome, not elsewhere classified: Secondary | ICD-10-CM | POA: Diagnosis not present

## 2024-05-03 DIAGNOSIS — K5903 Drug induced constipation: Secondary | ICD-10-CM | POA: Diagnosis not present

## 2024-05-03 DIAGNOSIS — G894 Chronic pain syndrome: Secondary | ICD-10-CM | POA: Diagnosis not present

## 2024-05-03 DIAGNOSIS — M1612 Unilateral primary osteoarthritis, left hip: Secondary | ICD-10-CM | POA: Diagnosis not present

## 2024-05-03 NOTE — Telephone Encounter (Signed)
 Pt called to confirmed appt. Appointment confirmed

## 2024-05-04 DIAGNOSIS — Z9181 History of falling: Secondary | ICD-10-CM | POA: Diagnosis not present

## 2024-05-04 DIAGNOSIS — M25551 Pain in right hip: Secondary | ICD-10-CM | POA: Diagnosis not present

## 2024-05-04 DIAGNOSIS — M545 Low back pain, unspecified: Secondary | ICD-10-CM | POA: Diagnosis not present

## 2024-05-04 DIAGNOSIS — Z681 Body mass index (BMI) 19 or less, adult: Secondary | ICD-10-CM | POA: Diagnosis not present

## 2024-05-15 ENCOUNTER — Other Ambulatory Visit (HOSPITAL_COMMUNITY): Payer: Self-pay | Admitting: Internal Medicine

## 2024-05-15 ENCOUNTER — Encounter (HOSPITAL_COMMUNITY): Payer: Self-pay

## 2024-05-15 ENCOUNTER — Ambulatory Visit (HOSPITAL_COMMUNITY): Admission: RE | Admit: 2024-05-15 | Source: Ambulatory Visit

## 2024-05-15 DIAGNOSIS — Z681 Body mass index (BMI) 19 or less, adult: Secondary | ICD-10-CM | POA: Diagnosis not present

## 2024-05-15 DIAGNOSIS — R1021 Pelvic and perineal pain right side: Secondary | ICD-10-CM

## 2024-05-16 ENCOUNTER — Ambulatory Visit (HOSPITAL_COMMUNITY)
Admission: RE | Admit: 2024-05-16 | Discharge: 2024-05-16 | Disposition: A | Discharge status: Home / Self Care | Source: Ambulatory Visit | Attending: Internal Medicine | Admitting: Internal Medicine

## 2024-05-16 DIAGNOSIS — R1021 Pelvic and perineal pain right side: Secondary | ICD-10-CM | POA: Insufficient documentation

## 2024-05-16 DIAGNOSIS — K8020 Calculus of gallbladder without cholecystitis without obstruction: Secondary | ICD-10-CM | POA: Diagnosis not present

## 2024-05-16 DIAGNOSIS — N281 Cyst of kidney, acquired: Secondary | ICD-10-CM | POA: Diagnosis not present

## 2024-05-16 MED ORDER — IOHEXOL 9 MG/ML PO SOLN
500.0000 mL | ORAL | Status: AC
  Administered 2024-05-16: 500 mL via ORAL

## 2024-05-17 ENCOUNTER — Ambulatory Visit: Admitting: Surgical

## 2024-05-30 ENCOUNTER — Ambulatory Visit

## 2024-05-30 DIAGNOSIS — R339 Retention of urine, unspecified: Secondary | ICD-10-CM

## 2024-05-30 MED ORDER — CIPROFLOXACIN HCL 500 MG PO TABS
500.0000 mg | ORAL_TABLET | Freq: Once | ORAL | Status: AC
  Administered 2024-05-30: 15:00:00 500 mg via ORAL

## 2024-05-30 NOTE — Progress Notes (Signed)
 Cath Change/ Replacement  Patient is present today for a catheter change due to urinary retention.  10 ml of water was removed from the balloon, a 14 FR foley cath was removed without difficulty.  Patient was cleaned and prepped in a sterile fashion with [id number].  A 14  FR foley cath was replaced into the bladder, no complications were noted. Urine return was noted 3 ml and urine was Dark yellow in color. The balloon was filled with 10ml of sterile water. A leg bag was attached for drainage.  A night bag was also given to the patient and patient was given instruction on how to change from one bag to another. Patient was given proper instruction on catheter care.    Performed by: Carlos, CMA  Follow up: 4 weeks Cath Change

## 2024-07-03 ENCOUNTER — Ambulatory Visit

## 2024-07-19 ENCOUNTER — Ambulatory Visit

## 2024-07-21 ENCOUNTER — Telehealth: Payer: Self-pay | Admitting: Cardiology

## 2024-07-21 NOTE — Telephone Encounter (Signed)
 Patient was called regarding missed remote transmission. #[id number] vmb full

## 2024-10-18 ENCOUNTER — Ambulatory Visit

## 2025-01-17 ENCOUNTER — Ambulatory Visit

## 2025-04-18 ENCOUNTER — Ambulatory Visit

## 2025-07-18 ENCOUNTER — Ambulatory Visit
